# Patient Record
Sex: Female | Born: 1965
Health system: Southern US, Community
[De-identification: ages and names within clinical notes are randomized; demographics above are authoritative.]

## PROBLEM LIST (undated history)

## (undated) DIAGNOSIS — D649 Anemia, unspecified: Secondary | ICD-10-CM

## (undated) DIAGNOSIS — J45909 Unspecified asthma, uncomplicated: Secondary | ICD-10-CM

## (undated) DIAGNOSIS — K219 Gastro-esophageal reflux disease without esophagitis: Secondary | ICD-10-CM

## (undated) DIAGNOSIS — N189 Chronic kidney disease, unspecified: Secondary | ICD-10-CM

## (undated) DIAGNOSIS — E119 Type 2 diabetes mellitus without complications: Secondary | ICD-10-CM

## (undated) DIAGNOSIS — K589 Irritable bowel syndrome without diarrhea: Secondary | ICD-10-CM

## (undated) DIAGNOSIS — G5600 Carpal tunnel syndrome, unspecified upper limb: Secondary | ICD-10-CM

## (undated) DIAGNOSIS — F419 Anxiety disorder, unspecified: Secondary | ICD-10-CM

## (undated) DIAGNOSIS — E669 Obesity, unspecified: Secondary | ICD-10-CM

## (undated) DIAGNOSIS — M1990 Unspecified osteoarthritis, unspecified site: Secondary | ICD-10-CM

## (undated) DIAGNOSIS — I10 Essential (primary) hypertension: Secondary | ICD-10-CM

## (undated) DIAGNOSIS — F32A Depression, unspecified: Secondary | ICD-10-CM

## (undated) HISTORY — DX: Unspecified osteoarthritis, unspecified site: M19.90

## (undated) HISTORY — PX: CHOLECYSTECTOMY: SHX55

## (undated) HISTORY — PX: HIP SURGERY: SHX245

## (undated) HISTORY — DX: Type 2 diabetes mellitus without complications: E11.9

## (undated) HISTORY — DX: Anxiety disorder, unspecified: F41.9

## (undated) HISTORY — DX: Unspecified asthma, uncomplicated: J45.909

## (undated) HISTORY — DX: Carpal tunnel syndrome, unspecified upper limb: G56.00

## (undated) HISTORY — DX: Chronic kidney disease, unspecified: N18.9

## (undated) HISTORY — PX: MASTECTOMY: SHX3

## (undated) HISTORY — PX: OTHER SURGICAL HISTORY: SHX169

## (undated) HISTORY — DX: Essential (primary) hypertension: I10

## (undated) HISTORY — DX: Obesity, unspecified: E66.9

## (undated) HISTORY — DX: Gastro-esophageal reflux disease without esophagitis: K21.9

## (undated) HISTORY — DX: Irritable bowel syndrome, unspecified: K58.9

## (undated) HISTORY — DX: Depression, unspecified: F32.A

## (undated) HISTORY — PX: DILATION AND CURETTAGE OF UTERUS: SHX78

## (undated) HISTORY — DX: Anemia, unspecified: D64.9

---

## 1998-06-26 ENCOUNTER — Other Ambulatory Visit: Admission: RE | Admit: 1998-06-26 | Discharge: 1998-06-26 | Payer: Self-pay | Admitting: Obstetrics & Gynecology

## 1999-08-30 ENCOUNTER — Other Ambulatory Visit: Admission: RE | Admit: 1999-08-30 | Discharge: 1999-08-30 | Payer: Self-pay | Admitting: Obstetrics & Gynecology

## 2000-01-31 ENCOUNTER — Encounter: Payer: Self-pay | Admitting: Obstetrics & Gynecology

## 2000-01-31 ENCOUNTER — Ambulatory Visit (HOSPITAL_COMMUNITY): Admission: RE | Admit: 2000-01-31 | Discharge: 2000-01-31 | Payer: Self-pay | Admitting: Obstetrics & Gynecology

## 2000-03-06 ENCOUNTER — Encounter: Payer: Self-pay | Admitting: Obstetrics & Gynecology

## 2000-03-06 ENCOUNTER — Ambulatory Visit (HOSPITAL_COMMUNITY): Admission: RE | Admit: 2000-03-06 | Discharge: 2000-03-06 | Payer: Self-pay | Admitting: Obstetrics & Gynecology

## 2000-07-11 ENCOUNTER — Inpatient Hospital Stay (HOSPITAL_COMMUNITY): Admission: AD | Admit: 2000-07-11 | Discharge: 2000-07-13 | Payer: Self-pay | Admitting: Obstetrics & Gynecology

## 2003-01-27 ENCOUNTER — Other Ambulatory Visit: Admission: RE | Admit: 2003-01-27 | Discharge: 2003-01-27 | Payer: Self-pay | Admitting: *Deleted

## 2004-02-06 ENCOUNTER — Ambulatory Visit (HOSPITAL_COMMUNITY): Admission: RE | Admit: 2004-02-06 | Discharge: 2004-02-06 | Payer: Self-pay | Admitting: Obstetrics

## 2005-10-08 ENCOUNTER — Other Ambulatory Visit: Admission: RE | Admit: 2005-10-08 | Discharge: 2005-10-08 | Payer: Self-pay | Admitting: Obstetrics and Gynecology

## 2006-01-11 ENCOUNTER — Encounter (INDEPENDENT_AMBULATORY_CARE_PROVIDER_SITE_OTHER): Payer: Self-pay | Admitting: Specialist

## 2006-01-11 ENCOUNTER — Ambulatory Visit (HOSPITAL_COMMUNITY): Admission: RE | Admit: 2006-01-11 | Discharge: 2006-01-11 | Payer: Self-pay | Admitting: Obstetrics and Gynecology

## 2006-01-24 ENCOUNTER — Ambulatory Visit (HOSPITAL_COMMUNITY): Admission: RE | Admit: 2006-01-24 | Discharge: 2006-01-24 | Payer: Self-pay | Admitting: Obstetrics and Gynecology

## 2008-07-19 ENCOUNTER — Encounter: Admission: RE | Admit: 2008-07-19 | Discharge: 2008-07-19 | Payer: Self-pay | Admitting: Obstetrics and Gynecology

## 2008-10-20 ENCOUNTER — Inpatient Hospital Stay (HOSPITAL_COMMUNITY): Admission: RE | Admit: 2008-10-20 | Discharge: 2008-10-24 | Payer: Self-pay | Admitting: Obstetrics and Gynecology

## 2008-10-21 ENCOUNTER — Encounter (INDEPENDENT_AMBULATORY_CARE_PROVIDER_SITE_OTHER): Payer: Self-pay | Admitting: Obstetrics and Gynecology

## 2010-08-05 ENCOUNTER — Encounter: Payer: Self-pay | Admitting: Obstetrics and Gynecology

## 2010-10-24 LAB — CBC
HCT: 27.4 % — ABNORMAL LOW (ref 36.0–46.0)
HCT: 35.8 % — ABNORMAL LOW (ref 36.0–46.0)
Hemoglobin: 9.2 g/dL — ABNORMAL LOW (ref 12.0–15.0)
Platelets: 286 10*3/uL (ref 150–400)
RDW: 15.1 % (ref 11.5–15.5)
WBC: 12.3 10*3/uL — ABNORMAL HIGH (ref 4.0–10.5)

## 2010-10-24 LAB — COMPREHENSIVE METABOLIC PANEL
ALT: 19 U/L (ref 0–35)
AST: 24 U/L (ref 0–37)
BUN: 16 mg/dL (ref 6–23)
CO2: 22 mEq/L (ref 19–32)
Calcium: 9.7 mg/dL (ref 8.4–10.5)
Chloride: 106 mEq/L (ref 96–112)
Creatinine, Ser: 0.76 mg/dL (ref 0.4–1.2)
GFR calc non Af Amer: >60 mL/min (ref >60)
Sodium: 135 mEq/L (ref 135–145)
Total Protein: 6 g/dL (ref 6.0–8.3)

## 2010-10-24 LAB — GLUCOSE, CAPILLARY

## 2010-11-27 NOTE — Discharge Summary (Signed)
NAME:  Laurie Simmons, Laurie Simmons NO.:  [id number]   MEDICAL RECORD NO.:  [id number]          PATIENT TYPE:  INP   LOCATION:  9108                          FACILITY:  [facility]   PHYSICIAN:  Lenoard Aden, M.D.DATE OF BIRTH:  1966-02-09   DATE OF ADMISSION:  10/20/2008  DATE OF DISCHARGE:  10/24/2008                               DISCHARGE SUMMARY   ADMITTING DIAGNOSES:  1. Term pregnancy at 39 weeks.  2. Diabetes.  3. Hypertension.   DISCHARGE DIAGNOSES:  1. Term cesarean section delivery.  2. Diabetes.  3. Hypertension.   The patient is a 45 year old white female G4, P1-0-2-2, status post C-  section for fetal distress.  She received prenatal care at [facility]  since [redacted] weeks gestation with Dr. Billy Coast, primary Taelynn Mcelhannon.  Risk factors  in pregnancy included gestational diabetes and chronic hypertension.   PRENATAL LABS:  GBS status positive.  Blood type O positive.   MEDICAL HISTORY:  Significant for,  1. Obesity.  2. Gastroesophageal reflux disease.  3. IBS.  4. Hiatal hernia.   GYNECOLOGIC HISTORY:  Significant for small uterine fibroid.   PREGNANCY HISTORY:  Significant for gestational diabetes.  She has had 2  miscarriages and 1 uncomplicated vaginal delivery.  She has also had a D  and C x2.   SURGICAL HISTORY:  Positive for cholecystectomy and dislocated hip  surgery.  She has had wisdom teeth extraction.  She is married.   PHYSICAL EXAMINATION:  VITAL SIGNS:  Today, temperature 98.2, heart rate  83, respirations 18, and blood pressure is 117/75.  LUNGS:  Clear to auscultation.  HEART:  Regular rate and rhythm.  ABDOMEN:  Soft and nontender.  Positive bowel sounds x4 quadrants.  Fundus is firm at the umbilicus.  Incision is noted to have moderate  erythema superior to staples.  Staples are otherwise intact.  Scant  lochia.  EXTREMITIES:  Trace pedal edema.  Negative Homans.   Female infant is in the NICU with low blood sugars, unsure of discharge  today  for the infant.  The circumcision has been done.  Baby is bottle  feeding.  This is postop day #3.  Planning discharge, as the patient's  stable status.   DISCHARGE DIAGNOSES:  1. Gravida 4, para 2, with primary low transverse cesarean section.  2. History of gestational diabetes, A2, diet controlled.  3. Chronic hypertension, on Procardia and labetalol.  Blood pressures      are stable.  She has acute blood loss anemia.  Her status is      stable.  Continue Procardia and labetalol after discharge.   PLAN:  To remove staples and recheck BP in the office on October 27, 2007,  at 2 p.m.   DISCHARGE MEDICATIONS:  1. Percocet 1-2 tabs p.o. every 4 hours p.r.n., dispensed #25.  2. Ibuprofen 600 mg p.o. every 6 hours p.r.n., dispensed #25.   ACTIVITY AT DISCHARGE:  Up ad lib.   DIET:  Regular.   Postop incision care is to keep it clean and dry.  Symptoms to report  are fever,  vomiting, and heavy vaginal bleeding.  The [facility] OB/GYN  booklet has been given.  Again, followup appointment on October 26, 2008,  at 2 p.m.      Arlana Lindau, NP      Lenoard Aden, M.D.  Electronically Signed    JF/MEDQ  D:  10/24/2008  T:  10/25/2008  Job:  [id number]

## 2010-11-27 NOTE — H&P (Signed)
NAME:  Laurie Simmons, Laurie Simmons NO.:  [id number]   MEDICAL RECORD NO.:  [id number]          PATIENT TYPE:  INP   LOCATION:  9198                          [facility]ILITY[facility]  [facility]   PHYSICIAN:  Lenoard Aden, M.D.DATE OF BIRTH:  September 19, 1965   DATE OF ADMISSION:  10/20/2008  DATE OF DISCHARGE:                              HISTORY & PHYSICAL   CHIEF COMPLAINT:  Diabetes, hypertension, and induction at 39 weeks.   She is a 45 year old white female G4, P1-0-2-1, currently at [redacted] weeks  gestation who presents for cervical ripening induction.  She has known  gestational diabetes with recent poor control noted.  She has a history  of labile chronic hypertension, on Procardia and labetalol.  She is a  nonsmoker, nondrinker.  She denies domestic physical violence.   She allergies to CODEINE.   She has a personal history of reflux, IBS, and hiatal hernia __________  small uterine fibroid.  Gestational diabetes is noted and 2  uncomplicated pregnancy losses __________ remarkable for one  uncomplicated 7 pounds 15 ounce delivery at term.   SURGICAL HISTORY:  Remarkable for  D and C x2, cholecystectomy,  dislocated hip surgery in 1970, wisdom teeth extraction.   PHYSICAL EXAMINATION:  GENERAL:  She is a well-developed, well-nourished  white female, in no acute distress.  HEENT:  Normal.  LUNGS:  Clear.  HEART:  Regular rate and rhythm.  ABDOMEN:  Soft, gravid, and nontender.  __________.  Cervix is closed, 70% vertex, -1.  EXTREMITIES:  There are no cords.  NEUROLOGIC:  Nonfocal.  SKIN:  Intact.   NST is reactive.  Cervidil was placed.  Glucose and PIH panel pending.   IMPRESSION:  1. A 39-week OB.  2  Gestational diabetes, controlled by diet.  1. Chronic hypertension on labetalol and Procardia with appropriately-      grown fetus and normal antepartum surveillance.   PLAN:  Plan is to proceed with cervical ripening and induction.  GBS  positive.  We will treat antepartum with  penicillin prophylaxis.      Lenoard Aden, M.D.  Electronically Signed     RJT/MEDQ  D:  10/20/2008  T:  10/21/2008  Job:  [id number]

## 2010-11-27 NOTE — Op Note (Signed)
NAME:  Laurie Simmons, Laurie Simmons NO.:  [id number]   MEDICAL RECORD NO.:  [id number]          PATIENT TYPE:  INP   LOCATION:  9108                          [facility]   PHYSICIAN:  Lenoard Aden, M.D.DATE OF BIRTH:  10/14/1965   DATE OF PROCEDURE:  10/21/2008  DATE OF DISCHARGE:                               OPERATIVE REPORT   PREOPERATIVE DIAGNOSES:  1. A 39-week intrauterine pregnancy.  2. Chronic hypertension.  3. Gestational diabetes.  4. Nonreassuring fetal heart rate tracing.   POSTOPERATIVE DIAGNOSES:  1. A 39-week intrauterine pregnancy.  2. Chronic hypertension.  3. Gestational diabetes.  4. Nonreassuring fetal heart rate tracing.   PROCEDURE:  Primary low-segment transverse cesarean section.   SURGEON:  Lenoard Aden, MD   ASSISTANT:  Lendon Colonel, MD   ANESTHESIA:  Spinal by Raul Del, MD   ESTIMATED BLOOD LOSS:  1000 mL   COMPLICATIONS:  None.   DRAINS:  Foley.   COUNTS:  Correct.   DISPOSITION:  The patient to recovery in good condition.   FINDINGS:  Full-term living female, occiput transverse position, Apgars 7  and 8.  Cord pH is 7.13.  Nuchal cord x2.  Placenta to Pathology.   BRIEF OPERATIVE NOTE:  After being apprised risks of anesthesia,  infection, bleeding, injury to abdominal organs, and need for repair  delayed versus immediate complications to include bowel and bladder  injury, the patient was brought to the operating room where she was  administered spinal anesthetic without complications, prepped and draped  in usual sterile fashion.  Foley catheter placed.  Pfannenstiel skin  incision made with a scalpel, carried down to fascia which was nicked in  the midline and opened transversely using Mayo scissors.  Rectus muscles  were dissected sharply in midline.  Peritoneum entered sharply.  Bladder  blade placed.  Visceral peritoneum scored sharply off the lower uterine  segment.  Kerr hysterotomy incision was  made.  Atraumatic delivery, full  term living female as noted.  Apgars 7 and 8.  Cord pH as noted.  Placenta  delivered from posterior location intact, three-vessel cord.  Uterus  exteriorized, curetted using dry lap pack and closed in 2 running  imbricating layers of 0 Monocryl suture.  There was a large 4-5 cm  anterior wall intramural fibroid palpable.  At this time, good  hemostasis was noted.  Interrupted suture placed in midline for  hemostasis.  Irrigation accomplished.  Bladder flap inspected, found be  hemostatic.  Normal tubes and normal ovaries were noted.  At this time,  after a two-layer uterine closure, and otherwise normal-appearing  endometrial cavity and normal abdominal cavity, the uterus has been  replaced and peritoneum was reapproximated using the 2-0 chromic in a  continuous running fashion.  Fascia closed using a 0 Monocryl in a  continuous running fashion.  Subcutaneous tissue reapproximated using a  2-0 plain in a continuous running fashion.  Skin closed using staples.  The patient tolerated the procedure well to recovery in good condition.      Richard J.  Billy Coast, M.D.  Electronically Signed     RJT/MEDQ  D:  10/21/2008  T:  10/22/2008  Job:  [id number]

## 2010-11-30 NOTE — Op Note (Signed)
NAME:  Laurie Simmons, Laurie Simmons NO.:  [id number]   MEDICAL RECORD NO.:  [id number]          PATIENT TYPE:  INP   LOCATION:  9304                          [facility]ILITY:  [facility]   PHYSICIAN:  Crist Fat. Rivard, M.D. DATE OF BIRTH:  1966/04/29   DATE OF PROCEDURE:  DATE OF DISCHARGE:                                 OPERATIVE REPORT   PREOPERATIVE DIAGNOSIS:  Missed abortion.   POSTOPERATIVE DIAGNOSIS:  Missed abortion.   PROCEDURE:  D&E.   SURGEON:  Sandra A. Rivard, M.D.   ESTIMATED BLOOD LOSS:  Minimal.   PROCEDURE:  After being informed of the planned procedure with possible  complications, including bleeding, infection, injury to uterus and retained  products of conception, informed consent is obtained and the patient is  taken to OR #4 and given IV sedation and placed in the lithotomy position.  She is prepped and draped in a sterile fashion and her bladder is emptied  with an in-and-out Foley catheter.  GYN exam reveals a bulky uterus 12-13  weeks in size with a flared cervix, two normal adnexa.  A weighted speculum  is inserted in the vagina and the anterior lip of the cervix is grasped with  tenaculum forceps.  We proceed with paracervical block using 20 mL of  Novocain 1% in the usual fashion.  Uterus is then sounded at 13 cm and the  cervix is easily dilated until Hegar dilator #31.  This allows easy entry of  a curved #10 cannula and we proceed with suction evacuation of products of  conception.  After evacuation is completed, a sharp curette is used to  assess the uterine cavity which appears to be empty of tissue after removal  of some decidual endometrium.   Instruments were then removed.  Instruments and sponge count is complete x2.  No active bleeding is perceived.  The procedure is very well tolerated by  the patient who is taken to recovery room in a well and stable condition.  Estimated blood loss is minimal.      Dois Davenport A. Rivard, M.D.  Electronically  Signed     SAR/MEDQ  D:  01/11/2006  T:  01/11/2006  Job:  [id number]

## 2010-11-30 NOTE — H&P (Signed)
NAME:  Laurie Simmons, Laurie Simmons                        ACCOUNT NO.:  0   MEDICAL RECORD NO.:  0                  PATIENT TYPE:   LOCATION:                                 FACILITY:   PHYSICIAN:  Naima A. Dillard, M.D. DATE OF BIRTH:  09/06/1965   DATE OF ADMISSION:  01/10/2006  DATE OF DISCHARGE:                                HISTORY & PHYSICAL   CHIEF COMPLAINT:  Missed abortion in first trimester.   HISTORY OF PRESENT ILLNESS:  The patient is a 45 year old, G2, P80 who  presented today on January 10, 2006, for a routine exam.  We were unable to  hear fetal heart tones.  An ultrasound was done and they were found to be  absent even with Doppler.  The patient was given her options and she has  decided to proceed with a D&E.   PAST MEDICAL HISTORY:  1.  Chronic hypertension.  2.  Gastroesophageal reflux disease.   MEDICATIONS:  1.  Labetalol 200 mg b.i.d.  2.  Zofran 4 mg p.o. q.6h. as needed.  3.  Nexium 40 mg each day.  4.  Prenatal vitamin one tablet each day.   PAST SURGICAL HISTORY:  Unremarkable.   PAST OBSTETRICAL HISTORY:  Significant for vaginal delivery in 2001,  weighing 7 pounds 15 ounces with no complications.   ALLERGIES:  CODEINE.   SOCIAL HISTORY:  The patient denies any alcohol, tobacco or drug use.   REVIEW OF SYSTEMS:  GENITOURINARY:  As above.  CARDIOVASCULAR:  Hypertension.  GASTROINTESTINAL:  Significant for GERD.  RESPIRATORY:  Unremarkable.  PSYCHIATRIC:  Unremarkable.   PHYSICAL EXAMINATION:  VITAL SIGNS:  Weight 236 pounds.  Blood pressure  130/80.  GENERAL:  The patient is in no apparent distress.  HEART:  Regular rate and rhythm.  LUNGS:  Clear to auscultation bilaterally.  ABDOMEN:  Soft and nontender.  EXTREMITIES:  No clubbing, cyanosis or edema.   LABORATORY DATA AND X-RAY FINDINGS:  On ultrasound, the fetus measures 11  weeks and 1 day and no fetal heart tones are present.   ASSESSMENT:  Missed abortion in the first trimester.   PLAN:  Plan is  for D&E.  The patient understands the risks are, but not  limited to, bleeding, infection, damage to internal organs such as bowel,  bladder, major blood vessels.  The procedure will be done with Dr. Estanislado Pandy.      Naima A. Normand Sloop, M.D.  Electronically Signed     NAD/MEDQ  D:  01/10/2006  T:  01/10/2006  Job:  [id number]

## 2011-01-23 ENCOUNTER — Other Ambulatory Visit: Payer: Self-pay | Admitting: Obstetrics and Gynecology

## 2011-01-23 DIAGNOSIS — Z1231 Encounter for screening mammogram for malignant neoplasm of breast: Secondary | ICD-10-CM

## 2011-02-06 ENCOUNTER — Ambulatory Visit
Admission: RE | Admit: 2011-02-06 | Discharge: 2011-02-06 | Disposition: A | Discharge status: Home / Self Care | Payer: BC Managed Care – PPO | Source: Ambulatory Visit | Attending: Obstetrics and Gynecology | Admitting: Obstetrics and Gynecology

## 2011-02-06 DIAGNOSIS — Z1231 Encounter for screening mammogram for malignant neoplasm of breast: Secondary | ICD-10-CM

## 2015-03-10 DIAGNOSIS — R0989 Other specified symptoms and signs involving the circulatory and respiratory systems: Secondary | ICD-10-CM

## 2015-03-10 DIAGNOSIS — J309 Allergic rhinitis, unspecified: Secondary | ICD-10-CM

## 2015-03-10 DIAGNOSIS — L501 Idiopathic urticaria: Secondary | ICD-10-CM

## 2015-03-10 DIAGNOSIS — J4540 Moderate persistent asthma, uncomplicated: Secondary | ICD-10-CM

## 2015-03-10 DIAGNOSIS — K219 Gastro-esophageal reflux disease without esophagitis: Secondary | ICD-10-CM | POA: Insufficient documentation

## 2015-03-10 HISTORY — DX: Moderate persistent asthma, uncomplicated: J45.40

## 2015-03-10 HISTORY — DX: Other specified symptoms and signs involving the circulatory and respiratory systems: R09.89

## 2015-03-10 HISTORY — DX: Idiopathic urticaria: L50.1

## 2015-03-10 HISTORY — DX: Allergic rhinitis, unspecified: J30.9

## 2015-04-13 ENCOUNTER — Ambulatory Visit (INDEPENDENT_AMBULATORY_CARE_PROVIDER_SITE_OTHER): Payer: BC Managed Care – PPO | Admitting: *Deleted

## 2015-04-13 DIAGNOSIS — J3089 Other allergic rhinitis: Secondary | ICD-10-CM

## 2015-04-20 DIAGNOSIS — J301 Allergic rhinitis due to pollen: Secondary | ICD-10-CM | POA: Diagnosis not present

## 2015-04-21 DIAGNOSIS — J3089 Other allergic rhinitis: Secondary | ICD-10-CM | POA: Diagnosis not present

## 2015-04-27 ENCOUNTER — Ambulatory Visit (INDEPENDENT_AMBULATORY_CARE_PROVIDER_SITE_OTHER): Payer: BC Managed Care – PPO | Admitting: *Deleted

## 2015-04-27 DIAGNOSIS — J309 Allergic rhinitis, unspecified: Secondary | ICD-10-CM

## 2015-05-11 ENCOUNTER — Ambulatory Visit (INDEPENDENT_AMBULATORY_CARE_PROVIDER_SITE_OTHER): Payer: BC Managed Care – PPO

## 2015-05-11 DIAGNOSIS — J309 Allergic rhinitis, unspecified: Secondary | ICD-10-CM | POA: Diagnosis not present

## 2015-05-25 ENCOUNTER — Ambulatory Visit (INDEPENDENT_AMBULATORY_CARE_PROVIDER_SITE_OTHER): Payer: BC Managed Care – PPO

## 2015-05-25 DIAGNOSIS — J309 Allergic rhinitis, unspecified: Secondary | ICD-10-CM | POA: Diagnosis not present

## 2015-06-12 ENCOUNTER — Ambulatory Visit (INDEPENDENT_AMBULATORY_CARE_PROVIDER_SITE_OTHER): Payer: BC Managed Care – PPO

## 2015-06-12 DIAGNOSIS — J309 Allergic rhinitis, unspecified: Secondary | ICD-10-CM

## 2015-06-20 DIAGNOSIS — J301 Allergic rhinitis due to pollen: Secondary | ICD-10-CM | POA: Diagnosis not present

## 2015-06-21 DIAGNOSIS — J3089 Other allergic rhinitis: Secondary | ICD-10-CM | POA: Diagnosis not present

## 2015-06-23 ENCOUNTER — Ambulatory Visit (INDEPENDENT_AMBULATORY_CARE_PROVIDER_SITE_OTHER): Payer: BC Managed Care – PPO | Admitting: *Deleted

## 2015-06-23 DIAGNOSIS — J309 Allergic rhinitis, unspecified: Secondary | ICD-10-CM | POA: Diagnosis not present

## 2015-06-29 ENCOUNTER — Ambulatory Visit (INDEPENDENT_AMBULATORY_CARE_PROVIDER_SITE_OTHER): Payer: BC Managed Care – PPO

## 2015-06-29 DIAGNOSIS — J309 Allergic rhinitis, unspecified: Secondary | ICD-10-CM | POA: Diagnosis not present

## 2015-07-03 ENCOUNTER — Encounter: Payer: Self-pay | Admitting: Allergy and Immunology

## 2015-07-03 ENCOUNTER — Ambulatory Visit (INDEPENDENT_AMBULATORY_CARE_PROVIDER_SITE_OTHER): Payer: BC Managed Care – PPO | Admitting: Allergy and Immunology

## 2015-07-03 VITALS — BP 120/88 | HR 84 | Resp 18 | Ht 64.29 in | Wt 262.3 lb

## 2015-07-03 DIAGNOSIS — J4540 Moderate persistent asthma, uncomplicated: Secondary | ICD-10-CM

## 2015-07-03 DIAGNOSIS — K219 Gastro-esophageal reflux disease without esophagitis: Secondary | ICD-10-CM | POA: Diagnosis not present

## 2015-07-03 DIAGNOSIS — J3089 Other allergic rhinitis: Secondary | ICD-10-CM | POA: Diagnosis not present

## 2015-07-03 DIAGNOSIS — L501 Idiopathic urticaria: Secondary | ICD-10-CM

## 2015-07-03 NOTE — Progress Notes (Signed)
[facility]  Follow-up Note  Refering Provider: No ref. provider found Primary Provider: Marco Collie, MD  Subjective:   Laurie Simmons is a 49 y.o. female who returns to the [place] in re-evaluation of the following:  HPI Comments:  Laurie Simmons returns to this clinic on 07/03/2015 in reevaluation of her asthma, allergic rhinitis, LPR and intermittent urticaria. Overall she is done quite well over the course of the past 6 months while consistently using her medical therapy which includes Qvar, montelukast, and Qnasl as well as Nexium 40 mg twice a day and immunotherapy. She is not had any exacerbations of her asthma requiring her to get a systemic steroid, she is does not have to use a short acting bronchodilator greater than twice a week, she's had very little problems with her nose and no problems with her throat and her reflux is under good control. She did obtain a flu vaccine. Laurie Simmons continues on immunotherapy and does have an EpiPen.   Current Outpatient Prescriptions on File Prior to Visit  Medication Sig Dispense Refill  . albuterol (VENTOLIN HFA) 108 (90 BASE) MCG/ACT inhaler Inhale 2 puffs into the lungs every 4 (four) hours as needed for wheezing or shortness of breath.    . beclomethasone (QVAR) 80 MCG/ACT inhaler Inhale 2 puffs into the lungs 2 (two) times daily.    . Beclomethasone Dipropionate (QNASL) 80 MCG/ACT AERS Place 2 puffs into the nose daily.    Marland Kitchen esomeprazole (NEXIUM) 40 MG capsule Take 40 mg by mouth 2 (two) times daily before a meal.    . montelukast (SINGULAIR) 10 MG tablet Take 10 mg by mouth at bedtime.    Marland Kitchen loratadine (CLARITIN) 10 MG tablet Take 10 mg by mouth daily. Reported on 07/03/2015     No current facility-administered medications on file prior to visit.    No orders of the defined types were placed in this encounter.    Past Medical History  Diagnosis Date  . Asthma   . GERD  (gastroesophageal reflux disease)     Past Surgical History  Procedure Laterality Date  . Caesarean    . Cholecystectomy    . Hip surgery Left     Allergies  Allergen Reactions  . Prednisone Hives and Other (See Comments)    Mood Swings.  . Codeine Anxiety    Review of Systems  Constitutional: Negative.   HENT: Negative.   Eyes: Negative.   Respiratory: Negative.   Cardiovascular: Negative.   Gastrointestinal: Negative.   Musculoskeletal: Negative.   Skin: Negative.   Hematological: Negative.      Objective:   Filed Vitals:   07/03/15 1557  BP: 120/88  Pulse: 84  Resp: 18   Height: 5' 4.29" (163.3 cm)  Weight: 262 lb 5.6 oz (119 kg)   Physical Exam  Constitutional: She appears well-developed and well-nourished. No distress.  HENT:  Head: Normocephalic and atraumatic. Head is without right periorbital erythema and without left periorbital erythema.  Right Ear: Tympanic membrane, external ear and ear canal normal. No drainage or tenderness. No foreign bodies. Tympanic membrane is not injected, not scarred, not perforated, not erythematous, not retracted and not bulging. No middle ear effusion.  Left Ear: Tympanic membrane, external ear and ear canal normal. No drainage or tenderness. No foreign bodies. Tympanic membrane is not injected, not scarred, not perforated, not erythematous, not retracted and not bulging.  No middle ear effusion.  Nose: Nose normal.  No mucosal edema, rhinorrhea, nose lacerations or sinus tenderness.  No foreign bodies.  Mouth/Throat: Oropharynx is clear and moist. No oropharyngeal exudate, posterior oropharyngeal edema, posterior oropharyngeal erythema or tonsillar abscesses.  Eyes: Lids are normal. Right eye exhibits no chemosis, no discharge and no exudate. No foreign body present in the right eye. Left eye exhibits no chemosis, no discharge and no exudate. No foreign body present in the left eye. Right conjunctiva is not injected. Left  conjunctiva is not injected.  Neck: Neck supple. No tracheal tenderness present. No tracheal deviation and no edema present. No thyroid mass and no thyromegaly present.  Cardiovascular: Normal rate, regular rhythm, S1 normal and S2 normal.  Exam reveals no gallop.   No murmur heard. Pulmonary/Chest: No accessory muscle usage or stridor. No respiratory distress. She has no wheezes. She has no rhonchi. She has no rales.  Abdominal: Soft.  Lymphadenopathy:       Head (right side): No tonsillar adenopathy present.       Head (left side): No tonsillar adenopathy present.    She has no cervical adenopathy.  Neurological: She is alert.  Skin: No rash noted. She is not diaphoretic.  Psychiatric: She has a normal mood and affect. Her behavior is normal.    Diagnostics:    Spirometry was performed and demonstrated an FEV1 of 2.30 at 80 % of predicted.  The patient had an Asthma Control Test with the following results: ACT Total Score: 24.    Assessment and Plan:   1. Moderate persistent asthma, uncomplicated   2. Other allergic rhinitis   3. Gastroesophageal reflux disease, esophagitis presence not specified   4. Idiopathic urticaria      1. Qvar 80 2 inhalations one time per day. Increase to 3 inhalations 3 times per day during flare up  2. Qnasl 80 1-2 sprays each nostril 3-7 times per week depending on disease activity  3. Montelukast 10 mg daily  4. Nexium 40 mg twice a day  5. Ventolin HFA and Claritin if needed  6. Continue immunotherapy and EpiPen  7. Return to clinic in 6 months or earlier if problem  I will have Laurie Simmons continue to use Qnasl and Qvar and montelukast and Nexium as specified above. She will continue to use immunotherapy as this appears to result in pretty good control of her atopic disease. I will see her back in this clinic in 6 months to investigate whether there is an additional opportunity to consolidate her treatment especially given the fact that she is  having a very good response to immunotherapy.   Allena Katz, MD [place]

## 2015-07-03 NOTE — Patient Instructions (Signed)
  1. Qvar 80 2 inhalations one time per day. Increase to 3 inhalations 3 times per day during flare up  2. Qnasl 80 1-2 sprays each nostril 3-7 times per week depending on disease activity  3. Montelukast 10 mg daily  4. Nexium 40 g twice a day  5. Ventolin HFA and Claritin if needed  6. Continue immunotherapy and EpiPen  7. Return to clinic in 6 months or earlier if problem

## 2015-07-13 ENCOUNTER — Ambulatory Visit (INDEPENDENT_AMBULATORY_CARE_PROVIDER_SITE_OTHER): Payer: BC Managed Care – PPO | Admitting: *Deleted

## 2015-07-13 DIAGNOSIS — J309 Allergic rhinitis, unspecified: Secondary | ICD-10-CM

## 2015-07-20 ENCOUNTER — Ambulatory Visit (INDEPENDENT_AMBULATORY_CARE_PROVIDER_SITE_OTHER): Payer: BC Managed Care – PPO | Admitting: *Deleted

## 2015-07-20 DIAGNOSIS — J309 Allergic rhinitis, unspecified: Secondary | ICD-10-CM | POA: Diagnosis not present

## 2015-07-27 ENCOUNTER — Ambulatory Visit (INDEPENDENT_AMBULATORY_CARE_PROVIDER_SITE_OTHER): Payer: BC Managed Care – PPO | Admitting: *Deleted

## 2015-07-27 DIAGNOSIS — J309 Allergic rhinitis, unspecified: Secondary | ICD-10-CM

## 2015-07-31 ENCOUNTER — Other Ambulatory Visit: Payer: Self-pay

## 2015-07-31 ENCOUNTER — Other Ambulatory Visit: Payer: Self-pay | Admitting: *Deleted

## 2015-07-31 MED ORDER — ESOMEPRAZOLE MAGNESIUM 40 MG PO CPDR: 40.0000 mg | DELAYED_RELEASE_CAPSULE | Freq: Two times a day (BID) | ORAL | Status: DC

## 2015-08-03 ENCOUNTER — Ambulatory Visit (INDEPENDENT_AMBULATORY_CARE_PROVIDER_SITE_OTHER): Payer: BC Managed Care – PPO | Admitting: *Deleted

## 2015-08-03 DIAGNOSIS — J309 Allergic rhinitis, unspecified: Secondary | ICD-10-CM

## 2015-08-11 ENCOUNTER — Ambulatory Visit (INDEPENDENT_AMBULATORY_CARE_PROVIDER_SITE_OTHER): Payer: BC Managed Care – PPO

## 2015-08-11 DIAGNOSIS — J309 Allergic rhinitis, unspecified: Secondary | ICD-10-CM

## 2015-08-17 ENCOUNTER — Ambulatory Visit (INDEPENDENT_AMBULATORY_CARE_PROVIDER_SITE_OTHER): Payer: BC Managed Care – PPO | Admitting: *Deleted

## 2015-08-17 DIAGNOSIS — J309 Allergic rhinitis, unspecified: Secondary | ICD-10-CM | POA: Diagnosis not present

## 2015-08-31 ENCOUNTER — Ambulatory Visit (INDEPENDENT_AMBULATORY_CARE_PROVIDER_SITE_OTHER): Payer: BC Managed Care – PPO | Admitting: *Deleted

## 2015-08-31 DIAGNOSIS — J309 Allergic rhinitis, unspecified: Secondary | ICD-10-CM

## 2015-09-14 ENCOUNTER — Encounter: Payer: Self-pay | Admitting: Allergy and Immunology

## 2015-09-14 ENCOUNTER — Ambulatory Visit (INDEPENDENT_AMBULATORY_CARE_PROVIDER_SITE_OTHER): Payer: BC Managed Care – PPO | Admitting: *Deleted

## 2015-09-14 DIAGNOSIS — J309 Allergic rhinitis, unspecified: Secondary | ICD-10-CM | POA: Diagnosis not present

## 2015-09-28 ENCOUNTER — Ambulatory Visit (INDEPENDENT_AMBULATORY_CARE_PROVIDER_SITE_OTHER): Payer: BC Managed Care – PPO | Admitting: *Deleted

## 2015-09-28 DIAGNOSIS — J309 Allergic rhinitis, unspecified: Secondary | ICD-10-CM | POA: Diagnosis not present

## 2015-10-12 ENCOUNTER — Ambulatory Visit (INDEPENDENT_AMBULATORY_CARE_PROVIDER_SITE_OTHER): Payer: BC Managed Care – PPO | Admitting: *Deleted

## 2015-10-12 DIAGNOSIS — J309 Allergic rhinitis, unspecified: Secondary | ICD-10-CM

## 2015-10-20 ENCOUNTER — Ambulatory Visit (INDEPENDENT_AMBULATORY_CARE_PROVIDER_SITE_OTHER): Payer: BC Managed Care – PPO | Admitting: *Deleted

## 2015-10-20 DIAGNOSIS — J309 Allergic rhinitis, unspecified: Secondary | ICD-10-CM | POA: Diagnosis not present

## 2015-10-30 ENCOUNTER — Ambulatory Visit (INDEPENDENT_AMBULATORY_CARE_PROVIDER_SITE_OTHER): Payer: BC Managed Care – PPO | Admitting: *Deleted

## 2015-10-30 DIAGNOSIS — J309 Allergic rhinitis, unspecified: Secondary | ICD-10-CM

## 2015-11-06 ENCOUNTER — Ambulatory Visit (INDEPENDENT_AMBULATORY_CARE_PROVIDER_SITE_OTHER): Payer: BC Managed Care – PPO | Admitting: *Deleted

## 2015-11-06 DIAGNOSIS — J309 Allergic rhinitis, unspecified: Secondary | ICD-10-CM

## 2015-11-17 ENCOUNTER — Ambulatory Visit (INDEPENDENT_AMBULATORY_CARE_PROVIDER_SITE_OTHER): Payer: BC Managed Care – PPO | Admitting: *Deleted

## 2015-11-17 DIAGNOSIS — J309 Allergic rhinitis, unspecified: Secondary | ICD-10-CM | POA: Diagnosis not present

## 2015-11-30 ENCOUNTER — Ambulatory Visit (INDEPENDENT_AMBULATORY_CARE_PROVIDER_SITE_OTHER): Payer: BC Managed Care – PPO | Admitting: *Deleted

## 2015-11-30 DIAGNOSIS — J309 Allergic rhinitis, unspecified: Secondary | ICD-10-CM | POA: Diagnosis not present

## 2015-12-14 ENCOUNTER — Ambulatory Visit (INDEPENDENT_AMBULATORY_CARE_PROVIDER_SITE_OTHER): Payer: BC Managed Care – PPO

## 2015-12-14 DIAGNOSIS — J309 Allergic rhinitis, unspecified: Secondary | ICD-10-CM | POA: Diagnosis not present

## 2015-12-14 DIAGNOSIS — D649 Anemia, unspecified: Secondary | ICD-10-CM

## 2015-12-14 HISTORY — DX: Anemia, unspecified: D64.9

## 2015-12-26 DIAGNOSIS — J301 Allergic rhinitis due to pollen: Secondary | ICD-10-CM | POA: Diagnosis not present

## 2015-12-27 DIAGNOSIS — J3089 Other allergic rhinitis: Secondary | ICD-10-CM | POA: Diagnosis not present

## 2015-12-28 ENCOUNTER — Ambulatory Visit (INDEPENDENT_AMBULATORY_CARE_PROVIDER_SITE_OTHER): Payer: BC Managed Care – PPO

## 2015-12-28 DIAGNOSIS — J309 Allergic rhinitis, unspecified: Secondary | ICD-10-CM | POA: Diagnosis not present

## 2016-01-01 ENCOUNTER — Encounter: Payer: Self-pay | Admitting: Allergy and Immunology

## 2016-01-01 ENCOUNTER — Ambulatory Visit (INDEPENDENT_AMBULATORY_CARE_PROVIDER_SITE_OTHER): Payer: BC Managed Care – PPO | Admitting: Allergy and Immunology

## 2016-01-01 VITALS — BP 130/82 | HR 100 | Resp 18

## 2016-01-01 DIAGNOSIS — K219 Gastro-esophageal reflux disease without esophagitis: Secondary | ICD-10-CM | POA: Diagnosis not present

## 2016-01-01 DIAGNOSIS — J4540 Moderate persistent asthma, uncomplicated: Secondary | ICD-10-CM

## 2016-01-01 DIAGNOSIS — J309 Allergic rhinitis, unspecified: Secondary | ICD-10-CM

## 2016-01-01 NOTE — Progress Notes (Signed)
Follow-up Note  Referring Provider: Marco Collie, MD Primary Provider: Marco Collie, MD Date of Office Visit: 01/01/2016  Subjective:   Laurie Simmons (DOB: November 06, 1965) is a 50 y.o. female who returns to the [facility] [place] on 01/01/2016 in re-evaluation of the following:  HPI: Laurie Simmons presents to this clinic in reevaluation of her asthma and allergic rhinitis treated with immunotherapy and LPR and intermittent urticaria. I last saw her in his clinic in December 2016.  During the interval she has not been having any palms with her asthma. She has not required a systemic steroid and can exercise and does not use a short acting bronchodilator. She continues on Qvar.  Her nose has been doing well. She's not required an antibiotic for an episode of sinusitis.  Her reflux was under very good control while consistently using her Nexium but unfortunately she developed what sounds like gastroenteritis and she's having some stomach upset along with her diarrhea that is slowly improving over the course of the past week.  Laurie Simmons has been diagnosed with iron deficiency anemia and is now using iron tablets and using orange juice at the time that she takes her iron tablet.    Medication List           beclomethasone 80 MCG/ACT inhaler  Commonly known as:  QVAR  Inhale 2 puffs into the lungs 2 (two) times daily.     EPIPEN 2-PAK 0.3 mg/0.3 mL Soaj injection  Generic drug:  EPINEPHrine  Use as directed for life-threatening allergic reaction.     esomeprazole 40 MG capsule  Commonly known as:  NEXIUM  Take 1 capsule (40 mg total) by mouth 2 (two) times daily before a meal.     ferrous sulfate 325 (65 FE) MG EC tablet  Take 325 mg by mouth 3 (three) times daily with meals.     LATUDA 40 MG Tabs tablet  Generic drug:  lurasidone  Take by mouth daily.     loratadine 10 MG tablet  Commonly known as:  CLARITIN  Take 10 mg by mouth daily. Reported on 07/03/2015     montelukast 10 MG  tablet  Commonly known as:  SINGULAIR  Take 10 mg by mouth at bedtime.     QNASL 80 MCG/ACT Aers  Generic drug:  Beclomethasone Dipropionate  Place 2 puffs into the nose daily.     valsartan-hydrochlorothiazide 320-25 MG tablet  Commonly known as:  DIOVAN-HCT  Take 1 tablet by mouth daily.     VENTOLIN HFA 108 (90 Base) MCG/ACT inhaler  Generic drug:  albuterol  Inhale 2 puffs into the lungs every 4 (four) hours as needed for wheezing or shortness of breath.     VIIBRYD 40 MG Tabs  Generic drug:  Vilazodone HCl        Past Medical History  Diagnosis Date  . Asthma   . GERD (gastroesophageal reflux disease)   . Severe anemia [id number]    Past Surgical History  Procedure Laterality Date  . Caesarean    . Cholecystectomy    . Hip surgery Left     Allergies  Allergen Reactions  . Prednisone Hives and Other (See Comments)    Mood Swings.  . Codeine Anxiety    Review of systems negative except as noted in HPI / PMHx or noted below:  Review of Systems  Constitutional: Negative.   HENT: Negative.   Eyes: Negative.   Respiratory: Negative.   Cardiovascular: Negative.   Gastrointestinal: Negative.  Genitourinary: Negative.   Musculoskeletal: Negative.   Skin: Negative.   Neurological: Negative.   Endo/Heme/Allergies: Negative.   Psychiatric/Behavioral: Negative.      Objective:   Filed Vitals:   01/01/16 1622  BP: 130/82  Pulse: 100  Resp: 18          Physical Exam  Constitutional: She is well-developed, well-nourished, and in no distress.  HENT:  Head: Normocephalic.  Right Ear: Tympanic membrane, external ear and ear canal normal.  Left Ear: Tympanic membrane, external ear and ear canal normal.  Nose: Nose normal. No mucosal edema or rhinorrhea.  Mouth/Throat: Uvula is midline, oropharynx is clear and moist and mucous membranes are normal. No oropharyngeal exudate.  Eyes: Conjunctivae are normal.  Neck: Trachea normal. No tracheal tenderness  present. No tracheal deviation present. No thyromegaly present.  Cardiovascular: Normal rate, regular rhythm, S1 normal, S2 normal and normal heart sounds.   No murmur heard. Pulmonary/Chest: Breath sounds normal. No stridor. No respiratory distress. She has no wheezes. She has no rales.  Musculoskeletal: She exhibits no edema.  Lymphadenopathy:       Head (right side): No tonsillar adenopathy present.       Head (left side): No tonsillar adenopathy present.    She has no cervical adenopathy.  Neurological: She is alert. Gait normal.  Skin: No rash noted. She is not diaphoretic. No erythema. Nails show no clubbing.  Psychiatric: Mood and affect normal.    Diagnostics:    Spirometry was performed and demonstrated an FEV1 of 2.88 at 98 % of predicted.  The patient had an Asthma Control Test with the following results: ACT Total Score: 23.    Assessment and Plan:   1. Allergic rhinitis, unspecified allergic rhinitis type   2. Moderate persistent asthma, uncomplicated   3. Gastroesophageal reflux disease, esophagitis presence not specified     1. Qvar 80 2 inhalations one time per day. Increase to 3 inhalations 3 times per day during flare up  2. Qnasl 80 1-2 sprays each nostril 3-7 times per week depending on disease activity  3. Montelukast 10 mg daily  4. Nexium 40 g twice a day  5. Ventolin HFA and Claritin if needed  6. Continue immunotherapy and EpiPen  7. Return to clinic in 6 months or earlier if problem  8. Obtain flu vaccine this fall  Laurie Simmons appears to have very good control of her atopic disease on her current medical plan which includes anti-inflammatory medications for her respiratory tract and immunotherapy. We'll continue her on this plan and see her back in this clinic and approximately 6 months or earlier if there is a problem.  Laurie Katz, MD [place]

## 2016-01-01 NOTE — Patient Instructions (Signed)
  1. Qvar 80 2 inhalations one time per day. Increase to 3 inhalations 3 times per day during flare up  2. Qnasl 80 1-2 sprays each nostril 3-7 times per week depending on disease activity  3. Montelukast 10 mg daily  4. Nexium 40 g twice a day  5. Ventolin HFA and Claritin if needed  6. Continue immunotherapy and EpiPen  7. Return to clinic in 6 months or earlier if problem  8. Obtain flu vaccine this fall

## 2016-01-11 ENCOUNTER — Ambulatory Visit (INDEPENDENT_AMBULATORY_CARE_PROVIDER_SITE_OTHER): Payer: BC Managed Care – PPO | Admitting: *Deleted

## 2016-01-11 DIAGNOSIS — J309 Allergic rhinitis, unspecified: Secondary | ICD-10-CM

## 2016-01-25 ENCOUNTER — Ambulatory Visit (INDEPENDENT_AMBULATORY_CARE_PROVIDER_SITE_OTHER): Payer: BC Managed Care – PPO | Admitting: *Deleted

## 2016-01-25 DIAGNOSIS — J309 Allergic rhinitis, unspecified: Secondary | ICD-10-CM | POA: Diagnosis not present

## 2016-02-08 ENCOUNTER — Ambulatory Visit (INDEPENDENT_AMBULATORY_CARE_PROVIDER_SITE_OTHER): Payer: BC Managed Care – PPO | Admitting: *Deleted

## 2016-02-08 DIAGNOSIS — J309 Allergic rhinitis, unspecified: Secondary | ICD-10-CM

## 2016-02-16 ENCOUNTER — Other Ambulatory Visit: Payer: Self-pay | Admitting: *Deleted

## 2016-02-16 MED ORDER — ESOMEPRAZOLE MAGNESIUM 40 MG PO CPDR: 40.0000 mg | DELAYED_RELEASE_CAPSULE | Freq: Two times a day (BID) | ORAL | 5 refills | Status: DC

## 2016-02-19 ENCOUNTER — Other Ambulatory Visit: Payer: Self-pay | Admitting: *Deleted

## 2016-02-19 MED ORDER — ALBUTEROL SULFATE HFA 108 (90 BASE) MCG/ACT IN AERS: 2.0000 | INHALATION_SPRAY | RESPIRATORY_TRACT | 1 refills | Status: DC | PRN

## 2016-02-22 ENCOUNTER — Ambulatory Visit (INDEPENDENT_AMBULATORY_CARE_PROVIDER_SITE_OTHER): Payer: BC Managed Care – PPO

## 2016-02-22 DIAGNOSIS — J309 Allergic rhinitis, unspecified: Secondary | ICD-10-CM

## 2016-02-29 ENCOUNTER — Other Ambulatory Visit: Payer: Self-pay | Admitting: *Deleted

## 2016-02-29 ENCOUNTER — Ambulatory Visit (INDEPENDENT_AMBULATORY_CARE_PROVIDER_SITE_OTHER): Payer: BC Managed Care – PPO | Admitting: *Deleted

## 2016-02-29 DIAGNOSIS — J309 Allergic rhinitis, unspecified: Secondary | ICD-10-CM

## 2016-02-29 MED ORDER — BECLOMETHASONE DIPROPIONATE 80 MCG/ACT IN AERS: INHALATION_SPRAY | RESPIRATORY_TRACT | 3 refills | Status: DC

## 2016-03-07 ENCOUNTER — Ambulatory Visit (INDEPENDENT_AMBULATORY_CARE_PROVIDER_SITE_OTHER): Payer: BC Managed Care – PPO | Admitting: *Deleted

## 2016-03-07 DIAGNOSIS — J309 Allergic rhinitis, unspecified: Secondary | ICD-10-CM

## 2016-03-14 ENCOUNTER — Ambulatory Visit (INDEPENDENT_AMBULATORY_CARE_PROVIDER_SITE_OTHER): Payer: BC Managed Care – PPO | Admitting: *Deleted

## 2016-03-14 DIAGNOSIS — J309 Allergic rhinitis, unspecified: Secondary | ICD-10-CM

## 2016-03-21 ENCOUNTER — Ambulatory Visit (INDEPENDENT_AMBULATORY_CARE_PROVIDER_SITE_OTHER): Payer: BC Managed Care – PPO | Admitting: *Deleted

## 2016-03-21 DIAGNOSIS — J309 Allergic rhinitis, unspecified: Secondary | ICD-10-CM

## 2016-04-04 ENCOUNTER — Ambulatory Visit (INDEPENDENT_AMBULATORY_CARE_PROVIDER_SITE_OTHER): Payer: BC Managed Care – PPO | Admitting: *Deleted

## 2016-04-04 DIAGNOSIS — J309 Allergic rhinitis, unspecified: Secondary | ICD-10-CM | POA: Diagnosis not present

## 2016-04-25 ENCOUNTER — Ambulatory Visit (INDEPENDENT_AMBULATORY_CARE_PROVIDER_SITE_OTHER): Payer: BC Managed Care – PPO | Admitting: *Deleted

## 2016-04-25 DIAGNOSIS — J309 Allergic rhinitis, unspecified: Secondary | ICD-10-CM

## 2016-04-30 DIAGNOSIS — J3089 Other allergic rhinitis: Secondary | ICD-10-CM | POA: Diagnosis not present

## 2016-05-01 DIAGNOSIS — J301 Allergic rhinitis due to pollen: Secondary | ICD-10-CM | POA: Diagnosis not present

## 2016-05-09 ENCOUNTER — Ambulatory Visit (INDEPENDENT_AMBULATORY_CARE_PROVIDER_SITE_OTHER): Payer: BC Managed Care – PPO | Admitting: *Deleted

## 2016-05-09 DIAGNOSIS — J309 Allergic rhinitis, unspecified: Secondary | ICD-10-CM

## 2016-05-23 ENCOUNTER — Ambulatory Visit (INDEPENDENT_AMBULATORY_CARE_PROVIDER_SITE_OTHER): Payer: BC Managed Care – PPO | Admitting: *Deleted

## 2016-05-23 DIAGNOSIS — J309 Allergic rhinitis, unspecified: Secondary | ICD-10-CM | POA: Diagnosis not present

## 2016-06-01 ENCOUNTER — Other Ambulatory Visit: Payer: Self-pay | Admitting: Allergy and Immunology

## 2016-06-03 ENCOUNTER — Ambulatory Visit (INDEPENDENT_AMBULATORY_CARE_PROVIDER_SITE_OTHER): Payer: BC Managed Care – PPO | Admitting: *Deleted

## 2016-06-03 DIAGNOSIS — J309 Allergic rhinitis, unspecified: Secondary | ICD-10-CM | POA: Diagnosis not present

## 2016-06-20 ENCOUNTER — Ambulatory Visit (INDEPENDENT_AMBULATORY_CARE_PROVIDER_SITE_OTHER): Payer: BC Managed Care – PPO

## 2016-06-20 DIAGNOSIS — J309 Allergic rhinitis, unspecified: Secondary | ICD-10-CM | POA: Diagnosis not present

## 2016-06-27 ENCOUNTER — Ambulatory Visit (INDEPENDENT_AMBULATORY_CARE_PROVIDER_SITE_OTHER): Payer: BC Managed Care – PPO | Admitting: *Deleted

## 2016-06-27 DIAGNOSIS — J309 Allergic rhinitis, unspecified: Secondary | ICD-10-CM

## 2016-07-03 ENCOUNTER — Ambulatory Visit (INDEPENDENT_AMBULATORY_CARE_PROVIDER_SITE_OTHER): Payer: BC Managed Care – PPO | Admitting: Allergy and Immunology

## 2016-07-03 ENCOUNTER — Encounter: Payer: Self-pay | Admitting: Allergy and Immunology

## 2016-07-03 VITALS — BP 122/84 | HR 88 | Resp 18

## 2016-07-03 DIAGNOSIS — K219 Gastro-esophageal reflux disease without esophagitis: Secondary | ICD-10-CM

## 2016-07-03 DIAGNOSIS — J3089 Other allergic rhinitis: Secondary | ICD-10-CM

## 2016-07-03 DIAGNOSIS — L501 Idiopathic urticaria: Secondary | ICD-10-CM | POA: Diagnosis not present

## 2016-07-03 DIAGNOSIS — J4541 Moderate persistent asthma with (acute) exacerbation: Secondary | ICD-10-CM | POA: Diagnosis not present

## 2016-07-03 MED ORDER — MONTELUKAST SODIUM 10 MG PO TABS: ORAL_TABLET | ORAL | 5 refills | Status: DC

## 2016-07-03 NOTE — Patient Instructions (Addendum)
  1. Action plan for asthma flare up including Qvar 80 3 inhalations 3 times per day   2. Nasal saline few times a day while 'sick'  3. Use Qnasl 2 puffs each nostril one time per day while 'sick'  4. mucinex DM 1-2 tablets two times a day while 'sick'  5. Prednisone 10mg  one time a day for five days only   6. Continue Montelukast 10 mg daily  7. Continue Nexium 40 g twice a day  8. Continue Ventolin HFA and Claritin if needed  9. Continue immunotherapy and EpiPen  10. Return to clinic in 6 months or earlier if problem

## 2016-07-03 NOTE — Progress Notes (Signed)
Follow-up Note  Referring Provider: Marco Collie, MD Primary Provider: Marco Collie, MD Date of Office Visit: 07/03/2016  Subjective:   Laurie Simmons (DOB: [birth year]) is a 50 y.o. female who returns to the [facility] on 07/03/2016 in re-evaluation of the following:  HPI: Adashia returns to this clinic in reevaluation of her asthma and allergic rhinitis treated with immunotherapy and history of LPR and intermittent urticaria. I last saw her in his clinic in June 2017.  She was doing quite well regarding her atopic respiratory disease and did not require systemic steroid or an antibiotic during the interval. Rarely does she use a short acting bronchodilator. Her urticaria is been under excellent control. Her reflux is really been doing quite well.  However, around mid November or close to Thanksgiving she ended up in the urgent care center with "light bronchitis" manifested as coughing and postnasal drip and nasal congestion for which she may been given an antibiotic. Ever since then she's had throat clearing and postnasal drip and a small amount of cough in the morning. She is not had any ugly nasal discharge or headaches or anosmia or or ugly sputum production or chest pain.  For immunotherapy is going quite well. She has not had an adverse effects secondary to the administration of this treatment.  She did receive the flu vaccine this year.  Allergies as of 07/03/2016      Reactions   Prednisone Hives, Other (See Comments)   Mood Swings.   Codeine Anxiety      Medication List       Accurate as of 07/03/16  4:28 PM. Always use your most recent med list.          albuterol 108 (90 Base) MCG/ACT inhaler Commonly known as:  VENTOLIN HFA Inhale 2 puffs into the lungs every 4 (four) hours as needed for wheezing or shortness of breath.   beclomethasone 80 MCG/ACT inhaler Commonly known as:  QVAR Inhale to puffs twice daily to prevent cough or wheeze. Rinse mouth  after use.   EPIPEN 2-PAK 0.3 mg/0.3 mL Soaj injection Generic drug:  EPINEPHrine Use as directed for life-threatening allergic reaction.   esomeprazole 40 MG capsule Commonly known as:  NEXIUM Take 1 capsule (40 mg total) by mouth 2 (two) times daily before a meal.   ferrous sulfate 325 (65 FE) MG EC tablet Take 325 mg by mouth 3 (three) times daily with meals.   LATUDA 40 MG Tabs tablet Generic drug:  lurasidone Take by mouth daily.   montelukast 10 MG tablet Commonly known as:  SINGULAIR Take 10 mg by mouth at bedtime.   QNASL 80 MCG/ACT Aers Generic drug:  Beclomethasone Dipropionate INSTILL 1 to 2 sprays into each nostril once daily for STUFFY NOSE OR DRAINAGE   valsartan-hydrochlorothiazide 320-25 MG tablet Commonly known as:  DIOVAN-HCT Take 1 tablet by mouth daily.   VIIBRYD 40 MG Tabs Generic drug:  Vilazodone HCl       Past Medical History:  Diagnosis Date  . Asthma   . GERD (gastroesophageal reflux disease)   . Severe anemia [id number]    Past Surgical History:  Procedure Laterality Date  . caesarean    . CHOLECYSTECTOMY    . HIP SURGERY Left     Review of systems negative except as noted in HPI / PMHx or noted below:  Review of Systems  Constitutional: Negative.   HENT: Negative.   Eyes: Negative.   Respiratory: Negative.  Cardiovascular: Negative.   Gastrointestinal: Negative.   Genitourinary: Negative.   Musculoskeletal: Negative.   Skin: Negative.   Neurological: Negative.   Endo/Heme/Allergies: Negative.   Psychiatric/Behavioral: Negative.      Objective:   Vitals:   07/03/16 1615  BP: 122/84  Pulse: 88  Resp: 18          Physical Exam  Constitutional: She is well-developed, well-nourished, and in no distress.  HENT:  Head: Normocephalic.  Right Ear: Tympanic membrane, external ear and ear canal normal.  Left Ear: Tympanic membrane, external ear and ear canal normal.  Nose: Mucosal edema present. No rhinorrhea.    Mouth/Throat: Uvula is midline, oropharynx is clear and moist and mucous membranes are normal. No oropharyngeal exudate.  Eyes: Conjunctivae are normal.  Neck: Trachea normal. No tracheal tenderness present. No tracheal deviation present. No thyromegaly present.  Cardiovascular: Normal rate, regular rhythm, S1 normal, S2 normal and normal heart sounds.   No murmur heard. Pulmonary/Chest: Breath sounds normal. No stridor. No respiratory distress. She has no wheezes. She has no rales.  Musculoskeletal: She exhibits no edema.  Lymphadenopathy:       Head (right side): No tonsillar adenopathy present.       Head (left side): No tonsillar adenopathy present.    She has no cervical adenopathy.  Neurological: She is alert. Gait normal.  Skin: No rash noted. She is not diaphoretic. No erythema. Nails show no clubbing.  Psychiatric: Mood and affect normal.    Diagnostics:    Spirometry was performed and demonstrated an FEV1 of 2.58 at 2.87 % of predicted.  The patient had an Asthma Control Test with the following results: ACT Total Score: 24.    Assessment and Plan:   1. Asthma, not well controlled, moderate persistent, with acute exacerbation   2. Other allergic rhinitis   3. Idiopathic urticaria   4. Gastroesophageal reflux disease, esophagitis presence not specified     1. Action plan for asthma flare up including Qvar 80 3 inhalations 3 times per day   2. Nasal saline few times a day while 'sick'  3. Use Qnasl 2 puffs each nostril one time per day while 'sick'  4. mucinex DM 1-2 tablets two times a day while 'sick'  5. Prednisone 10mg  one time a day for five days only   6. Continue Montelukast 10 mg daily  7. Continue Nexium 40 g twice a day  8. Continue Ventolin HFA and Claritin if needed  9. Continue immunotherapy and EpiPen  10. Return to clinic in 6 months or earlier if problem  Laurelle appears to have respiratory tract inflammation most likely from the episode of  "bronchitis" that she contracted about 3 weeks ago or so. I will give her a very low dose of systemic steroids today and have her activate an action plan including inhaled steroids to see if we can eliminate all the inflammation from her respiratory tract. If she does well I will see her back in this clinic in 6 months or earlier if there is a problem.  Allena Katz, MD [place]

## 2016-07-04 ENCOUNTER — Other Ambulatory Visit: Payer: Self-pay | Admitting: *Deleted

## 2016-07-04 ENCOUNTER — Ambulatory Visit (INDEPENDENT_AMBULATORY_CARE_PROVIDER_SITE_OTHER): Payer: BC Managed Care – PPO | Admitting: *Deleted

## 2016-07-04 DIAGNOSIS — J309 Allergic rhinitis, unspecified: Secondary | ICD-10-CM

## 2016-07-04 MED ORDER — EPINEPHRINE 0.3 MG/0.3ML IJ SOAJ: 0.3000 mg | Freq: Once | INTRAMUSCULAR | 2 refills | Status: AC

## 2016-07-11 ENCOUNTER — Ambulatory Visit (INDEPENDENT_AMBULATORY_CARE_PROVIDER_SITE_OTHER): Payer: BC Managed Care – PPO | Admitting: *Deleted

## 2016-07-11 DIAGNOSIS — J309 Allergic rhinitis, unspecified: Secondary | ICD-10-CM

## 2016-07-25 ENCOUNTER — Ambulatory Visit (INDEPENDENT_AMBULATORY_CARE_PROVIDER_SITE_OTHER): Payer: BC Managed Care – PPO | Admitting: *Deleted

## 2016-07-25 DIAGNOSIS — J309 Allergic rhinitis, unspecified: Secondary | ICD-10-CM | POA: Diagnosis not present

## 2016-08-08 ENCOUNTER — Ambulatory Visit (INDEPENDENT_AMBULATORY_CARE_PROVIDER_SITE_OTHER): Payer: BC Managed Care – PPO | Admitting: *Deleted

## 2016-08-08 DIAGNOSIS — J309 Allergic rhinitis, unspecified: Secondary | ICD-10-CM | POA: Diagnosis not present

## 2016-08-09 NOTE — Addendum Note (Signed)
Addended by: Felipa Emory on: 08/09/2016 03:33 PM   Modules accepted: Orders

## 2016-08-15 ENCOUNTER — Ambulatory Visit (INDEPENDENT_AMBULATORY_CARE_PROVIDER_SITE_OTHER): Payer: BC Managed Care – PPO | Admitting: *Deleted

## 2016-08-15 DIAGNOSIS — J309 Allergic rhinitis, unspecified: Secondary | ICD-10-CM | POA: Diagnosis not present

## 2016-08-30 ENCOUNTER — Ambulatory Visit (INDEPENDENT_AMBULATORY_CARE_PROVIDER_SITE_OTHER): Payer: BC Managed Care – PPO | Admitting: *Deleted

## 2016-08-30 DIAGNOSIS — J309 Allergic rhinitis, unspecified: Secondary | ICD-10-CM | POA: Diagnosis not present

## 2016-09-12 ENCOUNTER — Ambulatory Visit (INDEPENDENT_AMBULATORY_CARE_PROVIDER_SITE_OTHER): Payer: BC Managed Care – PPO | Admitting: *Deleted

## 2016-09-12 DIAGNOSIS — J309 Allergic rhinitis, unspecified: Secondary | ICD-10-CM | POA: Diagnosis not present

## 2016-09-17 DIAGNOSIS — J301 Allergic rhinitis due to pollen: Secondary | ICD-10-CM | POA: Diagnosis not present

## 2016-09-18 DIAGNOSIS — J3089 Other allergic rhinitis: Secondary | ICD-10-CM | POA: Diagnosis not present

## 2016-09-26 ENCOUNTER — Ambulatory Visit (INDEPENDENT_AMBULATORY_CARE_PROVIDER_SITE_OTHER): Payer: BC Managed Care – PPO | Admitting: *Deleted

## 2016-09-26 DIAGNOSIS — J309 Allergic rhinitis, unspecified: Secondary | ICD-10-CM | POA: Diagnosis not present

## 2016-10-10 ENCOUNTER — Ambulatory Visit (INDEPENDENT_AMBULATORY_CARE_PROVIDER_SITE_OTHER): Payer: BC Managed Care – PPO | Admitting: *Deleted

## 2016-10-10 DIAGNOSIS — J309 Allergic rhinitis, unspecified: Secondary | ICD-10-CM

## 2016-10-18 ENCOUNTER — Ambulatory Visit (INDEPENDENT_AMBULATORY_CARE_PROVIDER_SITE_OTHER): Payer: BC Managed Care – PPO | Admitting: *Deleted

## 2016-10-18 DIAGNOSIS — J309 Allergic rhinitis, unspecified: Secondary | ICD-10-CM | POA: Diagnosis not present

## 2016-10-24 ENCOUNTER — Ambulatory Visit (INDEPENDENT_AMBULATORY_CARE_PROVIDER_SITE_OTHER): Payer: BC Managed Care – PPO | Admitting: *Deleted

## 2016-10-24 ENCOUNTER — Encounter: Payer: Self-pay | Admitting: Allergy and Immunology

## 2016-10-24 DIAGNOSIS — J309 Allergic rhinitis, unspecified: Secondary | ICD-10-CM | POA: Diagnosis not present

## 2016-11-07 ENCOUNTER — Ambulatory Visit (INDEPENDENT_AMBULATORY_CARE_PROVIDER_SITE_OTHER): Payer: BC Managed Care – PPO | Admitting: *Deleted

## 2016-11-07 DIAGNOSIS — J309 Allergic rhinitis, unspecified: Secondary | ICD-10-CM

## 2016-11-15 ENCOUNTER — Ambulatory Visit (INDEPENDENT_AMBULATORY_CARE_PROVIDER_SITE_OTHER): Payer: BC Managed Care – PPO | Admitting: *Deleted

## 2016-11-15 DIAGNOSIS — J309 Allergic rhinitis, unspecified: Secondary | ICD-10-CM

## 2016-11-21 ENCOUNTER — Ambulatory Visit (INDEPENDENT_AMBULATORY_CARE_PROVIDER_SITE_OTHER): Payer: BC Managed Care – PPO | Admitting: *Deleted

## 2016-11-21 DIAGNOSIS — J309 Allergic rhinitis, unspecified: Secondary | ICD-10-CM

## 2016-12-12 ENCOUNTER — Ambulatory Visit (INDEPENDENT_AMBULATORY_CARE_PROVIDER_SITE_OTHER): Payer: BC Managed Care – PPO

## 2016-12-12 DIAGNOSIS — J309 Allergic rhinitis, unspecified: Secondary | ICD-10-CM

## 2016-12-16 ENCOUNTER — Other Ambulatory Visit: Payer: Self-pay | Admitting: *Deleted

## 2016-12-16 MED ORDER — FLUTICASONE PROPIONATE HFA 110 MCG/ACT IN AERO: INHALATION_SPRAY | RESPIRATORY_TRACT | 0 refills | Status: DC

## 2016-12-16 NOTE — Telephone Encounter (Signed)
Replacement for Qvar sent to [facility]

## 2016-12-24 DIAGNOSIS — J301 Allergic rhinitis due to pollen: Secondary | ICD-10-CM | POA: Diagnosis not present

## 2016-12-24 NOTE — Progress Notes (Signed)
Vials made 12-25-16  jm

## 2017-01-02 ENCOUNTER — Encounter: Payer: Self-pay | Admitting: Allergy and Immunology

## 2017-01-02 ENCOUNTER — Ambulatory Visit (INDEPENDENT_AMBULATORY_CARE_PROVIDER_SITE_OTHER): Payer: BC Managed Care – PPO | Admitting: Allergy and Immunology

## 2017-01-02 ENCOUNTER — Ambulatory Visit: Payer: Self-pay

## 2017-01-02 VITALS — BP 124/92 | HR 80 | Resp 16

## 2017-01-02 DIAGNOSIS — J3089 Other allergic rhinitis: Secondary | ICD-10-CM

## 2017-01-02 DIAGNOSIS — K120 Recurrent oral aphthae: Secondary | ICD-10-CM

## 2017-01-02 DIAGNOSIS — K219 Gastro-esophageal reflux disease without esophagitis: Secondary | ICD-10-CM | POA: Diagnosis not present

## 2017-01-02 DIAGNOSIS — J4540 Moderate persistent asthma, uncomplicated: Secondary | ICD-10-CM

## 2017-01-02 DIAGNOSIS — J309 Allergic rhinitis, unspecified: Secondary | ICD-10-CM

## 2017-01-02 MED ORDER — AZELASTINE HCL 0.15 % NA SOLN: NASAL | 5 refills | Status: DC

## 2017-01-02 MED ORDER — VALACYCLOVIR HCL 500 MG PO TABS: ORAL_TABLET | ORAL | 0 refills | Status: DC

## 2017-01-02 MED ORDER — RANITIDINE HCL 300 MG PO TABS: 300.0000 mg | ORAL_TABLET | Freq: Every day | ORAL | 1 refills | Status: DC

## 2017-01-02 MED ORDER — BUDESONIDE-FORMOTEROL FUMARATE 160-4.5 MCG/ACT IN AERO: INHALATION_SPRAY | RESPIRATORY_TRACT | 0 refills | Status: DC

## 2017-01-02 NOTE — Patient Instructions (Addendum)
  1. Start a sample of Symbicort 160 2 inhalations twice a day with spacer to replace Flovent for the next month  2. Start Valtrex 500 mg once a day for the next 5 days  3. Start ranitidine 300 mg once a day in the evening  4. Consolidate all caffeine consumption aiming for none  5. Continue Qnasl 1- 2 puffs each nostril one time per day   6. Continue Nexium 40 g twice a day  7. Continue Ventolin HFA if needed  8. Continue nasal Azelastine, OTC antihistamine, nasal saline, if needed  9. Continue immunotherapy and EpiPen  10. Return to clinic in 4 weeks or earlier if problem

## 2017-01-02 NOTE — Progress Notes (Signed)
Follow-up Note  Referring Provider: Marco Collie, MD  Primary Provider: Marco Collie, MD Date of Office Visit: 01/02/2017  Subjective:   Laurie Simmons (DOB: 11-Jun-1966) is a 52 y.o. female who returns to the [facility] [place] on 01/02/2017 in re-evaluation of the following:  HPI: Laurie Simmons returns to this clinic in reevaluation of her asthma and allergic rhinitis and LPR and history of intermittent urticaria. I last saw her in this clinic in December 2017.  She was doing quite well until approximately one month ago. At that point time she developed lots of postnasal drip and throat clearing and feeling something in her throat. She has been having some sniffling but not a significant amount of nasal congestion nor anosmia or ugly nasal discharge.  She has been having lots of burping and burning and a bubble in her throat over the course of the past month as well. She still continues to drink 2 caffeinated drinks per day.  She has also noticed that she may have a little bit more shortness of breath and she has been using her Qvar consistently over the course of the past month. Over the course of the past week she had to change Qvar to Flovent because of an insurance issue.  As well, she ended up in the urgent care center several weeks back because she developed "ulcers" in her mouth and she was given some type of mouth wash to use. This ulcer issue did appear to correlate with the use of BuSpar and she has discontinued her BuSpar over the course of the past week.  She continues on immunotherapy which she is presently using every 3 weeks. She has not had an adverse effects secondary to the administration of this form of treatment.  Allergies as of 01/02/2017      Reactions   Prednisone Hives, Other (See Comments)   Mood Swings.   Codeine Anxiety      Medication List      albuterol 108 (90 Base) MCG/ACT inhaler Commonly known as:  VENTOLIN HFA Inhale 2 puffs into the lungs every 4  (four) hours as needed for wheezing or shortness of breath.   esomeprazole 40 MG capsule Commonly known as:  NEXIUM Take 1 capsule (40 mg total) by mouth 2 (two) times daily before a meal.   ferrous sulfate 325 (65 FE) MG EC tablet Take 325 mg by mouth 3 (three) times daily with meals.   fluticasone 110 MCG/ACT inhaler Commonly known as:  FLOVENT HFA Inhale 3 puffs 3 times daily for asthma flareup   QNASL 80 MCG/ACT Aers Generic drug:  Beclomethasone Dipropionate INSTILL 1 to 2 sprays into each nostril once daily for STUFFY NOSE OR DRAINAGE   topiramate 25 MG tablet Commonly known as:  TOPAMAX 2 (two) times daily.   valsartan-hydrochlorothiazide 320-25 MG tablet Commonly known as:  DIOVAN-HCT Take 1 tablet by mouth daily.   VIIBRYD 40 MG Tabs Generic drug:  Vilazodone HCl daily.       Past Medical History:  Diagnosis Date  . Asthma   . GERD (gastroesophageal reflux disease)   . Severe anemia [id number]    Past Surgical History:  Procedure Laterality Date  . caesarean    . CHOLECYSTECTOMY    . HIP SURGERY Left     Review of systems negative except as noted in HPI / PMHx or noted below:  Review of Systems  Constitutional: Negative.   HENT: Negative.   Eyes: Negative.   Respiratory:  Negative.   Cardiovascular: Negative.   Gastrointestinal: Negative.   Genitourinary: Negative.   Musculoskeletal: Negative.   Skin: Negative.   Neurological: Negative.   Endo/Heme/Allergies: Negative.   Psychiatric/Behavioral: Negative.      Objective:   Vitals:   01/02/17 1002  BP: (!) 124/92  Pulse: 80  Resp: 16          Physical Exam  Constitutional: She is well-developed, well-nourished, and in no distress.  HENT:  Head: Normocephalic.  Right Ear: Tympanic membrane, external ear and ear canal normal.  Left Ear: Tympanic membrane, external ear and ear canal normal.  Nose: Nose normal. No mucosal edema or rhinorrhea.  Mouth/Throat: Uvula is midline and mucous  membranes are normal. Posterior oropharyngeal erythema (small aphthous ulcer lateral tongue right) present. No oropharyngeal exudate.  Eyes: Conjunctivae are normal.  Neck: Trachea normal. No tracheal tenderness present. No tracheal deviation present. No thyromegaly present.  Cardiovascular: Normal rate, regular rhythm, S1 normal, S2 normal and normal heart sounds.   No murmur heard. Pulmonary/Chest: Breath sounds normal. No stridor. No respiratory distress. She has no wheezes. She has no rales.  Musculoskeletal: She exhibits no edema.  Lymphadenopathy:       Head (right side): No tonsillar adenopathy present.       Head (left side): No tonsillar adenopathy present.    She has no cervical adenopathy.  Neurological: She is alert. Gait normal.  Skin: No rash noted. She is not diaphoretic. No erythema. Nails show no clubbing.  Psychiatric: Mood and affect normal.    Diagnostics:    Spirometry was performed and demonstrated an FEV1 of 2.63 at 93 % of predicted.  The patient had an Asthma Control Test with the following results: ACT Total Score: 22.    Assessment and Plan:   1. Not well controlled moderate persistent asthma   2. Other allergic rhinitis   3. LPRD (laryngopharyngeal reflux disease)   4. Aphthous stomatitis     1. Start a sample of Symbicort 160 2 inhalations twice a day with spacer to replace Flovent for the next month  2. Start Valtrex 500 mg once a day for the next 5 days  3. Start ranitidine 300 mg once a day in the evening  4. Consolidate all caffeine consumption aiming for none  5. Continue Qnasl 1- 2 puffs each nostril one time per day   6. Continue Nexium 40 g twice a day  7. Continue Ventolin HFA if needed  8. Continue nasal Azelastine, OTC antihistamine, nasal saline, if needed  9. Continue immunotherapy and EpiPen  10. Return to clinic in 4 weeks or earlier if problem  I will treat Laurie Simmons with a little bit more anti-inflammatory medication for her  respiratory tract as noted above and also get her to be a little more aggressive about treating her reflux and LPR which includes the addition of a H2 receptor blocker and consolidation of her daily caffeine use. As well, for her aphthous stomatitis we will give her a trial of Valtrex. This condition may be secondary to her use of BuSpar but I think it would be worthwhile to empirically treat for herpetic infection with a very safe medication to see if this also helps her issue. I will see her back in this clinic in 4 weeks or earlier if there is a problem. If she does well we will remove her Symbicort and placed her back on a ICS as monotherapy.  Allena Katz, MD Allergy / Immunology [facility] 

## 2017-01-22 ENCOUNTER — Telehealth: Payer: Self-pay | Admitting: Allergy and Immunology

## 2017-01-22 NOTE — Telephone Encounter (Signed)
Please have her come in for a clinic visit. I think she had a 1 month follow-up so this would be just a little bit early. She should've responded to Symbicort so there may be some other treatment we can try.

## 2017-01-22 NOTE — Telephone Encounter (Signed)
Patient has been scheduled for 01-23-17 at 300pm

## 2017-01-22 NOTE — Telephone Encounter (Signed)
Laurie Simmons called in and stated that the new inhaler she was given is not working.  Laurie Simmons is still experiencing shortness of breath.  Laurie Simmons states it is hard for her to breath and catch her breath at times.  Please advise.

## 2017-01-23 ENCOUNTER — Encounter: Payer: Self-pay | Admitting: Allergy and Immunology

## 2017-01-23 ENCOUNTER — Ambulatory Visit: Payer: Self-pay

## 2017-01-23 ENCOUNTER — Ambulatory Visit (INDEPENDENT_AMBULATORY_CARE_PROVIDER_SITE_OTHER): Payer: BC Managed Care – PPO | Admitting: Allergy and Immunology

## 2017-01-23 VITALS — BP 132/70 | HR 100 | Resp 18

## 2017-01-23 DIAGNOSIS — J3089 Other allergic rhinitis: Secondary | ICD-10-CM

## 2017-01-23 DIAGNOSIS — K219 Gastro-esophageal reflux disease without esophagitis: Secondary | ICD-10-CM | POA: Diagnosis not present

## 2017-01-23 DIAGNOSIS — J309 Allergic rhinitis, unspecified: Secondary | ICD-10-CM

## 2017-01-23 DIAGNOSIS — J4540 Moderate persistent asthma, uncomplicated: Secondary | ICD-10-CM | POA: Diagnosis not present

## 2017-01-23 MED ORDER — METHYLPREDNISOLONE ACETATE 80 MG/ML IJ SUSP
80.0000 mg | Freq: Once | INTRAMUSCULAR | Status: AC
  Administered 2017-01-23: 80 mg via INTRAMUSCULAR

## 2017-01-23 NOTE — Progress Notes (Signed)
Follow-up Note  Referring Provider: Marco Collie, MD Primary Provider: Marco Collie, MD Date of Office Visit: 01/23/2017  Subjective:   Laurie Simmons (DOB: February 09, 1966) is a 51 y.o. female who returns to the [facility] [place] on 01/23/2017 in re-evaluation of the following:  HPI: Evin presents to this clinic in reevaluation of her respiratory tract problems. She is presently being treated for asthma and allergic rhinitis and LPR and also has a history of intermittent urticaria and her last visit to this clinic was 03 January 2017 at which point in time I had her start a sample of Symbicort and also had her add ranitidine to her reflux therapy and also had her use a course of Valtrex for what appeared to be aphthous stomatitis.  She can't really say she is much better regarding her breathing. She still has some shortness of breath and still feels this "bubble in her throat" and occasionally still has some burning in her throat. She has some dyspnea on exertion and that she does get somewhat short of breath when exerting herself. She has not been having any chest pain or coughing or sputum production or hemoptysis and she has no issues with her legs.  Allergies as of 01/23/2017      Reactions   Prednisone Hives, Other (See Comments)   Mood Swings.   Codeine Anxiety      Medication List      albuterol 108 (90 Base) MCG/ACT inhaler Commonly known as:  VENTOLIN HFA Inhale 2 puffs into the lungs every 4 (four) hours as needed for wheezing or shortness of breath.   Azelastine HCl 0.15 % Soln Can use one to two sprays in each nostril one to two times daily if needed.   budesonide-formoterol 160-4.5 MCG/ACT inhaler Commonly known as:  SYMBICORT Inhale two puffs twice daily to prevent cough or wheeze.  Rinse, gargle, and spit after use. Use with spacer.   esomeprazole 40 MG capsule Commonly known as:  NEXIUM Take 1 capsule (40 mg total) by mouth 2 (two) times daily before a meal.   ferrous sulfate 325 (65 FE) MG EC tablet Take 325 mg by mouth 3 (three) times daily with meals.   fluticasone 110 MCG/ACT inhaler Commonly known as:  FLOVENT HFA Inhale 3 puffs 3 times daily for asthma flareup   QNASL 80 MCG/ACT Aers Generic drug:  Beclomethasone Dipropionate INSTILL 1 to 2 sprays into each nostril once daily for STUFFY NOSE OR DRAINAGE   ranitidine 300 MG tablet Commonly known as:  ZANTAC Take 1 tablet (300 mg total) by mouth at bedtime.   topiramate 25 MG tablet Commonly known as:  TOPAMAX 2 (two) times daily.   valACYclovir 500 MG tablet Commonly known as:  VALTREX Take one tablet by mouth once daily for five days.   valsartan-hydrochlorothiazide 320-25 MG tablet Commonly known as:  DIOVAN-HCT Take 1 tablet by mouth daily.   VIIBRYD 40 MG Tabs Generic drug:  Vilazodone HCl daily.       Past Medical History:  Diagnosis Date  . Asthma   . GERD (gastroesophageal reflux disease)   . Severe anemia [id number]    Past Surgical History:  Procedure Laterality Date  . caesarean    . CHOLECYSTECTOMY    . HIP SURGERY Left     Review of systems negative except as noted in HPI / PMHx or noted below:  Review of Systems  Constitutional: Negative.   HENT: Negative.   Eyes: Negative.  Respiratory: Negative.   Cardiovascular: Negative.   Gastrointestinal: Negative.   Genitourinary: Negative.   Musculoskeletal: Negative.   Skin: Negative.   Neurological: Negative.   Endo/Heme/Allergies: Negative.   Psychiatric/Behavioral: Negative.      Objective:   Vitals:   01/23/17 1448  BP: 132/70  Pulse: 100  Resp: 18          Physical Exam  Constitutional: She is well-developed, well-nourished, and in no distress.  HENT:  Head: Normocephalic.  Right Ear: Tympanic membrane, external ear and ear canal normal.  Left Ear: Tympanic membrane, external ear and ear canal normal.  Nose: Nose normal. No mucosal edema or rhinorrhea.  Mouth/Throat: Uvula is  midline, oropharynx is clear and moist and mucous membranes are normal. No oropharyngeal exudate.  Eyes: Conjunctivae are normal.  Neck: Trachea normal. No tracheal tenderness present. No tracheal deviation present. No thyromegaly present.  Cardiovascular: Normal rate, regular rhythm, S1 normal, S2 normal and normal heart sounds.   No murmur heard. Pulmonary/Chest: Breath sounds normal. No stridor. No respiratory distress. She has no wheezes. She has no rales.  Musculoskeletal: She exhibits no edema.  Lymphadenopathy:       Head (right side): No tonsillar adenopathy present.       Head (left side): No tonsillar adenopathy present.    She has no cervical adenopathy.  Neurological: She is alert. Gait normal.  Skin: No rash noted. She is not diaphoretic. No erythema. Nails show no clubbing.  Psychiatric: Mood and affect normal.    Diagnostics: Oxygen saturation at rest on room air was 97%. With walking up and down the hallway her oxygen saturation remained at 95-96%.   Spirometry was performed and demonstrated an FEV1 of 2.68 at 94 % of predicted.  The patient had an Asthma Control Test with the following results: ACT Total Score: 15.    Assessment and Plan:   1. Not well controlled moderate persistent asthma   2. Other allergic rhinitis   3. LPRD (laryngopharyngeal reflux disease)     1. Continue Symbicort 160 2 inhalations twice a day   2. Continue ranitidine 300 mg once a day in the evening  4. Consolidate all caffeine consumption aiming for none  5. Continue Qnasl 1- 2 puffs each nostril one time per day   6. Continue Nexium 40 g twice a day  7. Continue Ventolin HFA if needed  8. Continue nasal Azelastine, OTC antihistamine, nasal saline, if needed  9. Continue immunotherapy and EpiPen  10. Depo-Medrol 80 IM delivered in clinic today  11. Further evaluation and treatment?  12. Return to clinic in 12 weeks or earlier if problem  I am not exactly sure why Calaya has  her respiratory tract complaints but I am going to treat her empirically with Depo-Medrol assuming that there is an inflammatory component giving rise to these symptoms. I did have a talk with her today about blood sugar in the use of systemic steroids and she will need to monitor that issue as the administration of prednisone in the past did raise her blood sugar. If she still remains with respiratory tract issues then I am going to obtain a imaging procedures of her respiratory tract and consider further evaluation for a blood flow issue either through her heart or to her lungs.  Allena Katz, MD Allergy / Immunology [place]

## 2017-01-23 NOTE — Patient Instructions (Addendum)
  1. Continue Symbicort 160 2 inhalations twice a day   2. Continue ranitidine 300 mg once a day in the evening  4. Consolidate all caffeine consumption aiming for none  5. Continue Qnasl 1- 2 puffs each nostril one time per day   6. Continue Nexium 40 g twice a day  7. Continue Ventolin HFA if needed  8. Continue nasal Azelastine, OTC antihistamine, nasal saline, if needed  9. Continue immunotherapy and EpiPen  10. Depo-Medrol 80 IM delivered in clinic today  11. Further evaluation and treatment?  12. Return to clinic in 12 weeks or earlier if problem

## 2017-01-31 ENCOUNTER — Ambulatory Visit: Payer: Self-pay | Admitting: *Deleted

## 2017-01-31 ENCOUNTER — Ambulatory Visit: Payer: BC Managed Care – PPO | Admitting: Allergy and Immunology

## 2017-02-14 ENCOUNTER — Ambulatory Visit (INDEPENDENT_AMBULATORY_CARE_PROVIDER_SITE_OTHER): Payer: BC Managed Care – PPO | Admitting: *Deleted

## 2017-02-14 DIAGNOSIS — J309 Allergic rhinitis, unspecified: Secondary | ICD-10-CM | POA: Diagnosis not present

## 2017-02-18 ENCOUNTER — Other Ambulatory Visit: Payer: Self-pay | Admitting: Allergy and Immunology

## 2017-02-21 ENCOUNTER — Telehealth: Payer: Self-pay | Admitting: Allergy and Immunology

## 2017-02-21 MED ORDER — BUDESONIDE-FORMOTEROL FUMARATE 160-4.5 MCG/ACT IN AERO: INHALATION_SPRAY | RESPIRATORY_TRACT | 4 refills | Status: DC

## 2017-02-21 NOTE — Telephone Encounter (Signed)
Laurie Simmons called in and stated she is out of her Symbicort sample and would like a prescription for Symbicort sent to [facility] in [place].

## 2017-02-21 NOTE — Telephone Encounter (Signed)
Will send Rx to [facility] but may have to change if Ins does not pay for same.

## 2017-02-27 ENCOUNTER — Other Ambulatory Visit: Payer: Self-pay | Admitting: *Deleted

## 2017-02-27 MED ORDER — BUDESONIDE-FORMOTEROL FUMARATE 160-4.5 MCG/ACT IN AERO: INHALATION_SPRAY | RESPIRATORY_TRACT | 4 refills | Status: DC

## 2017-02-28 DIAGNOSIS — I951 Orthostatic hypotension: Secondary | ICD-10-CM

## 2017-02-28 DIAGNOSIS — R197 Diarrhea, unspecified: Secondary | ICD-10-CM

## 2017-02-28 DIAGNOSIS — E876 Hypokalemia: Secondary | ICD-10-CM

## 2017-02-28 DIAGNOSIS — K219 Gastro-esophageal reflux disease without esophagitis: Secondary | ICD-10-CM | POA: Diagnosis not present

## 2017-02-28 DIAGNOSIS — A09 Infectious gastroenteritis and colitis, unspecified: Secondary | ICD-10-CM

## 2017-02-28 DIAGNOSIS — R55 Syncope and collapse: Secondary | ICD-10-CM | POA: Diagnosis not present

## 2017-03-01 DIAGNOSIS — E876 Hypokalemia: Secondary | ICD-10-CM | POA: Diagnosis not present

## 2017-03-01 DIAGNOSIS — I951 Orthostatic hypotension: Secondary | ICD-10-CM | POA: Diagnosis not present

## 2017-03-01 DIAGNOSIS — R197 Diarrhea, unspecified: Secondary | ICD-10-CM | POA: Diagnosis not present

## 2017-03-01 DIAGNOSIS — A09 Infectious gastroenteritis and colitis, unspecified: Secondary | ICD-10-CM | POA: Diagnosis not present

## 2017-03-13 ENCOUNTER — Ambulatory Visit (INDEPENDENT_AMBULATORY_CARE_PROVIDER_SITE_OTHER): Payer: BC Managed Care – PPO | Admitting: *Deleted

## 2017-03-13 DIAGNOSIS — J309 Allergic rhinitis, unspecified: Secondary | ICD-10-CM

## 2017-03-14 ENCOUNTER — Other Ambulatory Visit: Payer: Self-pay | Admitting: Allergy and Immunology

## 2017-03-27 ENCOUNTER — Ambulatory Visit (INDEPENDENT_AMBULATORY_CARE_PROVIDER_SITE_OTHER): Payer: BC Managed Care – PPO | Admitting: *Deleted

## 2017-03-27 DIAGNOSIS — J309 Allergic rhinitis, unspecified: Secondary | ICD-10-CM

## 2017-04-10 ENCOUNTER — Ambulatory Visit (INDEPENDENT_AMBULATORY_CARE_PROVIDER_SITE_OTHER): Payer: BC Managed Care – PPO | Admitting: *Deleted

## 2017-04-10 DIAGNOSIS — J309 Allergic rhinitis, unspecified: Secondary | ICD-10-CM | POA: Diagnosis not present

## 2017-04-14 ENCOUNTER — Ambulatory Visit (INDEPENDENT_AMBULATORY_CARE_PROVIDER_SITE_OTHER): Payer: BC Managed Care – PPO | Admitting: Allergy and Immunology

## 2017-04-14 VITALS — BP 132/88 | HR 74 | Resp 18

## 2017-04-14 DIAGNOSIS — J3089 Other allergic rhinitis: Secondary | ICD-10-CM | POA: Diagnosis not present

## 2017-04-14 DIAGNOSIS — J4540 Moderate persistent asthma, uncomplicated: Secondary | ICD-10-CM

## 2017-04-14 DIAGNOSIS — K219 Gastro-esophageal reflux disease without esophagitis: Secondary | ICD-10-CM

## 2017-04-14 NOTE — Patient Instructions (Addendum)
  1. Continue Symbicort 160 2 inhalations twice a day   2. Continue to Consolidate all caffeine consumption aiming for none  3. Continue Qnasl 1- 2 puffs each nostril one time per day   4. Continue Nexium 40 g twice a day  5. Continue Ventolin HFA if needed  6. Continue nasal Azelastine, OTC antihistamine, nasal saline, if needed  7. Continue immunotherapy and EpiPen  8. Obtain fall flu vaccine    9. Return to clinic in 6 months or earlier if problem

## 2017-04-14 NOTE — Progress Notes (Signed)
Follow-up Note  Referring Provider: Marco Collie, MD Primary Provider: Marco Collie, MD Date of Office Visit: 04/14/2017  Subjective:   Laurie Simmons (DOB: 1966/06/04) is a 50 y.o. female who returns to the [facility] and [place] on 04/14/2017 in re-evaluation of the following:  HPI:   Laurie Simmons returns to this clinic in reevaluation of her asthma and allergic rhinoconjunctivitis and LPR. She was last seen in this clinic July 2018.  While consistently using anti-inflammatory medications and continuing on immunotherapy she has had a very good response to her current situation with asthma and allergic rhinitis. She has not required a systemic steroid or an antibiotic since I have seen her in this clinic and she rarely uses a short acting bronchodilator.  She could not tolerate ranitidine for it gave rise to stomach upset and she is now controlling her reflux with Nexium twice a day. Since she has completely eliminated all caffeine consumption she has had much better control of her reflux and her throat issue.  Allergies as of 04/14/2017      Reactions   Prednisone Hives, Other (See Comments)   Mood Swings.   Codeine Anxiety      Medication List      ARIPiprazole 10 MG tablet Commonly known as:  ABILIFY   Azelastine HCl 0.15 % Soln Can use one to two sprays in each nostril one to two times daily if needed.   budesonide-formoterol 160-4.5 MCG/ACT inhaler Commonly known as:  SYMBICORT Inhale two puffs twice daily to prevent cough or wheeze.  Rinse, gargle, and spit after use. Use with spacer.   clorazepate 3.75 MG tablet Commonly known as:  TRANXENE TAKE 1/2 TO 1 TABLET BID   esomeprazole 40 MG capsule Commonly known as:  NEXIUM take 1 capsule by mouth twice a day before meals   ferrous sulfate 325 (65 FE) MG EC tablet Take 325 mg by mouth 3 (three) times daily with meals.   QNASL 80 MCG/ACT Aers Generic drug:  Beclomethasone Dipropionate INSTILL 1 to 2 sprays into each  nostril once daily for STUFFY NOSE OR DRAINAGE   topiramate 25 MG tablet Commonly known as:  TOPAMAX 2 (two) times daily.   valsartan-hydrochlorothiazide 320-25 MG tablet Commonly known as:  DIOVAN-HCT Take 1 tablet by mouth daily.   VENTOLIN HFA 108 (90 Base) MCG/ACT inhaler Generic drug:  albuterol inhale 2 puffs INTO THE LUNGS every 4 hours if needed for wheezing or shortness of breath   VIIBRYD 40 MG Tabs Generic drug:  Vilazodone HCl daily.       Past Medical History:  Diagnosis Date  . Asthma   . GERD (gastroesophageal reflux disease)   . Severe anemia [id number]    Past Surgical History:  Procedure Laterality Date  . caesarean    . CHOLECYSTECTOMY    . HIP SURGERY Left     Review of systems negative except as noted in HPI / PMHx or noted below:  Review of Systems  Constitutional: Negative.   HENT: Negative.   Eyes: Negative.   Respiratory: Negative.   Cardiovascular: Negative.   Gastrointestinal: Negative.   Genitourinary: Negative.   Musculoskeletal: Negative.   Skin: Negative.   Neurological: Negative.   Endo/Heme/Allergies: Negative.   Psychiatric/Behavioral: Negative.      Objective:   Vitals:   04/14/17 1544  BP: 132/88  Pulse: 74  Resp: 18          Physical Exam  Constitutional: She is well-developed, well-nourished, and in  no distress.  HENT:  Head: Normocephalic.  Right Ear: Tympanic membrane, external ear and ear canal normal.  Left Ear: Tympanic membrane, external ear and ear canal normal.  Nose: Nose normal. No mucosal edema or rhinorrhea.  Mouth/Throat: Uvula is midline, oropharynx is clear and moist and mucous membranes are normal. No oropharyngeal exudate.  Eyes: Conjunctivae are normal.  Neck: Trachea normal. No tracheal tenderness present. No tracheal deviation present. No thyromegaly present.  Cardiovascular: Normal rate, regular rhythm, S1 normal, S2 normal and normal heart sounds.   No murmur heard. Pulmonary/Chest:  Breath sounds normal. No stridor. No respiratory distress. She has no wheezes. She has no rales.  Musculoskeletal: She exhibits no edema.  Lymphadenopathy:       Head (right side): No tonsillar adenopathy present.       Head (left side): No tonsillar adenopathy present.    She has no cervical adenopathy.  Neurological: She is alert. Gait normal.  Skin: No rash noted. She is not diaphoretic. No erythema. Nails show no clubbing.  Psychiatric: Mood and affect normal.    Diagnostics:    Spirometry was performed and demonstrated an FEV1 of 2.76 at 97 % of predicted.  The patient had an Asthma Control Test with the following results: ACT Total Score: 20.    Assessment and Plan:   1. Asthma, moderate persistent, well-controlled   2. Other allergic rhinitis   3. LPRD (laryngopharyngeal reflux disease)     1. Continue Symbicort 160 2 inhalations twice a day   2. Continue to Consolidate all caffeine consumption aiming for none  3. Continue Qnasl 1- 2 puffs each nostril one time per day   4. Continue Nexium 40 g twice a day  5. Continue Ventolin HFA if needed  6. Continue nasal Azelastine, OTC antihistamine, nasal saline, if needed  7. Continue immunotherapy and EpiPen  8. Obtain fall flu vaccine    9. Return to clinic in 6 months or earlier if problem  Laurie Simmons appears to be doing quite well on her current medical therapy which includes anti-inflammatory medications for her respiratory tract, immunotherapy, and therapy directed against reflux. I will assume she will continue to do well and see her back in this clinic in 6 months or earlier if there is a problem.  Laurie Katz, MD Allergy / Immunology [place]

## 2017-04-15 ENCOUNTER — Encounter: Payer: Self-pay | Admitting: Allergy and Immunology

## 2017-04-18 ENCOUNTER — Other Ambulatory Visit: Payer: Self-pay | Admitting: Allergy and Immunology

## 2017-04-25 ENCOUNTER — Ambulatory Visit (INDEPENDENT_AMBULATORY_CARE_PROVIDER_SITE_OTHER): Payer: BC Managed Care – PPO | Admitting: *Deleted

## 2017-04-25 DIAGNOSIS — J309 Allergic rhinitis, unspecified: Secondary | ICD-10-CM

## 2017-05-08 ENCOUNTER — Ambulatory Visit (INDEPENDENT_AMBULATORY_CARE_PROVIDER_SITE_OTHER): Payer: BC Managed Care – PPO | Admitting: *Deleted

## 2017-05-08 DIAGNOSIS — J309 Allergic rhinitis, unspecified: Secondary | ICD-10-CM | POA: Diagnosis not present

## 2017-05-22 ENCOUNTER — Encounter: Payer: Self-pay | Admitting: Allergy and Immunology

## 2017-05-22 ENCOUNTER — Ambulatory Visit (INDEPENDENT_AMBULATORY_CARE_PROVIDER_SITE_OTHER): Payer: BC Managed Care – PPO | Admitting: Allergy and Immunology

## 2017-05-22 VITALS — BP 140/96 | HR 100 | Temp 98.8°F

## 2017-05-22 DIAGNOSIS — J3089 Other allergic rhinitis: Secondary | ICD-10-CM

## 2017-05-22 DIAGNOSIS — J4541 Moderate persistent asthma with (acute) exacerbation: Secondary | ICD-10-CM | POA: Diagnosis not present

## 2017-05-22 DIAGNOSIS — K219 Gastro-esophageal reflux disease without esophagitis: Secondary | ICD-10-CM

## 2017-05-22 MED ORDER — METHYLPREDNISOLONE ACETATE 80 MG/ML IJ SUSP
40.0000 mg | Freq: Once | INTRAMUSCULAR | Status: AC
  Administered 2017-05-22: 40 mg via INTRAMUSCULAR

## 2017-05-22 MED ORDER — METHYLPREDNISOLONE ACETATE 40 MG/ML IJ SUSP: 40.0000 mg | Freq: Once | INTRAMUSCULAR | Status: DC

## 2017-05-22 NOTE — Patient Instructions (Addendum)
  1. Continue Symbicort 160 2 inhalations twice a day   2. Continue to Consolidate all caffeine consumption aiming for none  3. Continue Qnasl 1- 2 puffs each nostril one time per day   4. Continue Nexium 40 g twice a day  5. Continue Ventolin HFA if needed  6. Continue nasal Azelastine, OTC antihistamine, nasal saline, if needed  7. Continue immunotherapy and EpiPen  8. For this episode:   A. Add sample Qvar 40 Redihaler 2 puffs 2 times per day  B. Depomedrol 40 IM delivered in clinic today  9. Return to clinic in 6 months or earlier if problem

## 2017-05-22 NOTE — Progress Notes (Signed)
Follow-up Note  Referring Provider: Marco Collie, MD   Primary Provider: Marco Collie, MD Date of Office Visit: 05/22/2017  Subjective:   Laurie Simmons (DOB: 30-Jun-1966) is a 51 y.o. female who returns to the [facility] and [place] on 05/22/2017 in re-evaluation of the following:  HPI: Laurie Simmons presents to this clinic in evaluation of a problem that developed over the course of the past several days.  Her last visit to this clinic was 14 April 2017 at which point in time her asthma and allergic rhinoconjunctivitis and LPR were doing very well.  2-3 days ago she developed problems with shortness of breath and she has been using her bronchodilator about twice a day.  She does respond to a short acting bronchodilator for a short period in time.  There is no obvious provoking factor giving rise to this asthma activity.  She has not had a significant environmental change, has not had a fever, and has no associated nasal symptoms.  Her daughter is at home with a cough.  She did develop an issue with vomiting this morning.  She had 4 vomiting episodes with unknown trigger and without any other associated GI symptoms.  Allergies as of 05/22/2017      Reactions   Prednisone Hives, Other (See Comments)   Mood Swings.   Codeine Anxiety      Medication List      ARIPiprazole 10 MG tablet Commonly known as:  ABILIFY   Azelastine HCl 0.15 % Soln Can use one to two sprays in each nostril one to two times daily if needed.   budesonide-formoterol 160-4.5 MCG/ACT inhaler Commonly known as:  SYMBICORT Inhale two puffs twice daily to prevent cough or wheeze.  Rinse, gargle, and spit after use. Use with spacer.   clorazepate 3.75 MG tablet Commonly known as:  TRANXENE TAKE 1/2 TO 1 TABLET BID   esomeprazole 40 MG capsule Commonly known as:  NEXIUM Take one capsule twice daily as directed   ferrous sulfate 325 (65 FE) MG EC tablet Take 325 mg by mouth 3 (three) times daily with meals.     QNASL 80 MCG/ACT Aers Generic drug:  Beclomethasone Dipropionate INSTILL 1 to 2 sprays into each nostril once daily for STUFFY NOSE OR DRAINAGE   topiramate 25 MG tablet Commonly known as:  TOPAMAX 2 (two) times daily.   valsartan-hydrochlorothiazide 320-25 MG tablet Commonly known as:  DIOVAN-HCT Take 1 tablet by mouth daily.   VENTOLIN HFA 108 (90 Base) MCG/ACT inhaler Generic drug:  albuterol inhale 2 puffs INTO THE LUNGS every 4 hours if needed for wheezing or shortness of breath   VIIBRYD 40 MG Tabs Generic drug:  Vilazodone HCl daily.       Past Medical History:  Diagnosis Date  . Asthma   . GERD (gastroesophageal reflux disease)   . Severe anemia [id number]    Past Surgical History:  Procedure Laterality Date  . caesarean    . CHOLECYSTECTOMY    . HIP SURGERY Left     Review of systems negative except as noted in HPI / PMHx or noted below:  Review of Systems  Constitutional: Negative.   HENT: Negative.   Eyes: Negative.   Respiratory: Negative.   Cardiovascular: Negative.   Gastrointestinal: Negative.   Genitourinary: Negative.   Musculoskeletal: Negative.   Skin: Negative.   Neurological: Negative.   Endo/Heme/Allergies: Negative.   Psychiatric/Behavioral: Negative.      Objective:   Vitals:   05/22/17 1531  BP: (!) 140/96  Pulse: 100  Temp: 98.8 F (37.1 C)          Physical Exam  Constitutional: She is well-developed, well-nourished, and in no distress.  HENT:  Head: Normocephalic.  Right Ear: Tympanic membrane, external ear and ear canal normal.  Left Ear: Tympanic membrane, external ear and ear canal normal.  Nose: Nose normal. No mucosal edema or rhinorrhea.  Mouth/Throat: Uvula is midline, oropharynx is clear and moist and mucous membranes are normal. No oropharyngeal exudate.  Eyes: Conjunctivae are normal.  Neck: Trachea normal. No tracheal tenderness present. No tracheal deviation present. No thyromegaly present.   Cardiovascular: Normal rate, regular rhythm, S1 normal, S2 normal and normal heart sounds.  No murmur heard. Pulmonary/Chest: Breath sounds normal. No stridor. No respiratory distress. She has no wheezes. She has no rales.  Musculoskeletal: She exhibits no edema.  Lymphadenopathy:       Head (right side): No tonsillar adenopathy present.       Head (left side): No tonsillar adenopathy present.    She has no cervical adenopathy.  Neurological: She is alert. Gait normal.  Skin: No rash noted. She is not diaphoretic. No erythema. Nails show no clubbing.  Psychiatric: Mood and affect normal.    Diagnostics:    Spirometry was performed and demonstrated an FEV1 of 2.44 at 86 % of predicted.  The patient had an Asthma Control Test with the following results:  .    Assessment and Plan:   1. Asthma, not well controlled, moderate persistent, with acute exacerbation   2. Other allergic rhinitis   3. LPRD (laryngopharyngeal reflux disease)     1. Continue Symbicort 160 2 inhalations twice a day   2. Continue to Consolidate all caffeine consumption aiming for none  3. Continue Qnasl 1- 2 puffs each nostril one time per day   4. Continue Nexium 40 g twice a day  5. Continue Ventolin HFA if needed  6. Continue nasal Azelastine, OTC antihistamine, nasal saline, if needed  7. Continue immunotherapy and EpiPen  8. For this episode:   A. Add sample Qvar 40 Redihaler 2 puffs 2 times per day  B. Depomedrol 40 IM delivered in clinic today  9. Return to clinic in 6 months or earlier if problem  Pooja appears to have some inflammation of her lower airway without an obvious provoking factor and we will treat her with the anti-inflammatory medications noted above.  She will keep in contact with me noting a response.  Further evaluation and treatment will be based upon her response to this plan  Allena Katz, MD Allergy / [place]

## 2017-05-26 ENCOUNTER — Encounter: Payer: Self-pay | Admitting: Allergy and Immunology

## 2017-05-30 ENCOUNTER — Ambulatory Visit (INDEPENDENT_AMBULATORY_CARE_PROVIDER_SITE_OTHER): Payer: BC Managed Care – PPO | Admitting: *Deleted

## 2017-05-30 DIAGNOSIS — J309 Allergic rhinitis, unspecified: Secondary | ICD-10-CM

## 2017-06-04 ENCOUNTER — Encounter: Payer: Self-pay | Admitting: *Deleted

## 2017-06-04 DIAGNOSIS — J301 Allergic rhinitis due to pollen: Secondary | ICD-10-CM | POA: Diagnosis not present

## 2017-06-04 NOTE — Progress Notes (Signed)
MAINTENANCE VIAL MADE. EXP: 06-04-18. HC

## 2017-06-13 ENCOUNTER — Ambulatory Visit (INDEPENDENT_AMBULATORY_CARE_PROVIDER_SITE_OTHER): Payer: BC Managed Care – PPO | Admitting: *Deleted

## 2017-06-13 DIAGNOSIS — J309 Allergic rhinitis, unspecified: Secondary | ICD-10-CM

## 2017-07-10 ENCOUNTER — Ambulatory Visit (INDEPENDENT_AMBULATORY_CARE_PROVIDER_SITE_OTHER): Payer: BC Managed Care – PPO | Admitting: *Deleted

## 2017-07-10 DIAGNOSIS — J309 Allergic rhinitis, unspecified: Secondary | ICD-10-CM | POA: Diagnosis not present

## 2017-07-31 ENCOUNTER — Ambulatory Visit (INDEPENDENT_AMBULATORY_CARE_PROVIDER_SITE_OTHER): Payer: BC Managed Care – PPO

## 2017-07-31 DIAGNOSIS — J309 Allergic rhinitis, unspecified: Secondary | ICD-10-CM

## 2017-08-21 ENCOUNTER — Ambulatory Visit (INDEPENDENT_AMBULATORY_CARE_PROVIDER_SITE_OTHER): Payer: BC Managed Care – PPO

## 2017-08-21 DIAGNOSIS — J309 Allergic rhinitis, unspecified: Secondary | ICD-10-CM | POA: Diagnosis not present

## 2017-09-01 ENCOUNTER — Ambulatory Visit: Payer: BC Managed Care – PPO | Admitting: Allergy and Immunology

## 2017-09-08 ENCOUNTER — Ambulatory Visit (INDEPENDENT_AMBULATORY_CARE_PROVIDER_SITE_OTHER): Payer: BC Managed Care – PPO | Admitting: *Deleted

## 2017-09-08 DIAGNOSIS — J309 Allergic rhinitis, unspecified: Secondary | ICD-10-CM

## 2017-10-09 ENCOUNTER — Ambulatory Visit (INDEPENDENT_AMBULATORY_CARE_PROVIDER_SITE_OTHER): Payer: BC Managed Care – PPO | Admitting: *Deleted

## 2017-10-09 DIAGNOSIS — J309 Allergic rhinitis, unspecified: Secondary | ICD-10-CM | POA: Diagnosis not present

## 2017-10-20 ENCOUNTER — Ambulatory Visit (INDEPENDENT_AMBULATORY_CARE_PROVIDER_SITE_OTHER): Payer: BC Managed Care – PPO | Admitting: *Deleted

## 2017-10-20 DIAGNOSIS — J309 Allergic rhinitis, unspecified: Secondary | ICD-10-CM

## 2017-11-06 ENCOUNTER — Ambulatory Visit (INDEPENDENT_AMBULATORY_CARE_PROVIDER_SITE_OTHER): Payer: BC Managed Care – PPO | Admitting: *Deleted

## 2017-11-06 DIAGNOSIS — J309 Allergic rhinitis, unspecified: Secondary | ICD-10-CM

## 2017-11-20 ENCOUNTER — Ambulatory Visit (INDEPENDENT_AMBULATORY_CARE_PROVIDER_SITE_OTHER): Payer: BC Managed Care – PPO | Admitting: *Deleted

## 2017-11-20 DIAGNOSIS — J309 Allergic rhinitis, unspecified: Secondary | ICD-10-CM | POA: Diagnosis not present

## 2017-12-04 ENCOUNTER — Ambulatory Visit (INDEPENDENT_AMBULATORY_CARE_PROVIDER_SITE_OTHER): Payer: BC Managed Care – PPO | Admitting: *Deleted

## 2017-12-04 DIAGNOSIS — J309 Allergic rhinitis, unspecified: Secondary | ICD-10-CM

## 2017-12-22 ENCOUNTER — Encounter: Payer: Self-pay | Admitting: Allergy and Immunology

## 2017-12-22 ENCOUNTER — Ambulatory Visit (INDEPENDENT_AMBULATORY_CARE_PROVIDER_SITE_OTHER): Payer: BC Managed Care – PPO | Admitting: Allergy and Immunology

## 2017-12-22 VITALS — BP 132/68 | HR 72 | Resp 20

## 2017-12-22 DIAGNOSIS — K219 Gastro-esophageal reflux disease without esophagitis: Secondary | ICD-10-CM

## 2017-12-22 DIAGNOSIS — J3089 Other allergic rhinitis: Secondary | ICD-10-CM

## 2017-12-22 DIAGNOSIS — J4540 Moderate persistent asthma, uncomplicated: Secondary | ICD-10-CM | POA: Diagnosis not present

## 2017-12-22 DIAGNOSIS — J309 Allergic rhinitis, unspecified: Secondary | ICD-10-CM

## 2017-12-22 NOTE — Progress Notes (Signed)
Follow-up Note  Referring Provider: Marco Collie, MD Primary Provider: Marco Collie, MD Date of Office Visit: 12/22/2017  Subjective:   Laurie Simmons (DOB: Jan 15, 1966) is a 52 y.o. female who returns to the [facility] [place] on 12/22/2017 in re-evaluation of the following:  HPI: Hayzel returns to this clinic in reevaluation of her asthma and allergic rhinitis and LPR.  I last saw her in this clinic 22 May 2017.  She has really had a very good winter and spring.  She has not required a systemic steroid or an antibiotic to treat any type of respiratory tract issue.  Rarely does she use a short acting bronchodilator.  For the most part she uses Symbicort just 1 time per day and no longer uses any Qnasl.  Her reflux has been under excellent control at this point in time while using Nexium just 1 time per day.  Her immunotherapy is going quite well currently at every 3 weeks without any adverse effects.  Tierrah has retired from [employer] for 26 years as of the conclusion of the school year.  Allergies as of 12/22/2017      Reactions   Prednisone Hives, Other (See Comments)   Mood Swings.   Codeine Anxiety      Medication List      Azelastine HCl 0.15 % Soln Can use one to two sprays in each nostril one to two times daily if needed.   budesonide-formoterol 160-4.5 MCG/ACT inhaler Commonly known as:  SYMBICORT Inhale two puffs twice daily to prevent cough or wheeze.  Rinse, gargle, and spit after use. Use with spacer.   clorazepate 3.75 MG tablet Commonly known as:  TRANXENE TAKE 1/2 TO 1 TABLET BID   esomeprazole 40 MG capsule Commonly known as:  NEXIUM Take one capsule twice daily as directed   ferrous sulfate 325 (65 FE) MG EC tablet Take 325 mg by mouth 3 (three) times daily with meals.   LO LOESTRIN FE 1 MG-10 MCG / 10 MCG tablet Generic drug:  Norethindrone-Ethinyl Estradiol-Fe Biphas Take 1 tablet daily by mouth.   QNASL 80 MCG/ACT Aers Generic  drug:  Beclomethasone Dipropionate INSTILL 1 to 2 sprays into each nostril once daily for STUFFY NOSE OR DRAINAGE   REXULTI 2 MG Tabs Generic drug:  Brexpiprazole Take 1 tablet by mouth daily.   topiramate 25 MG tablet Commonly known as:  TOPAMAX 2 (two) times daily.   valsartan-hydrochlorothiazide 320-25 MG tablet Commonly known as:  DIOVAN-HCT Take 1 tablet by mouth daily.   VENTOLIN HFA 108 (90 Base) MCG/ACT inhaler Generic drug:  albuterol inhale 2 puffs INTO THE LUNGS every 4 hours if needed for wheezing or shortness of breath   VIIBRYD 40 MG Tabs Generic drug:  Vilazodone HCl daily.       Past Medical History:  Diagnosis Date  . Asthma   . GERD (gastroesophageal reflux disease)   . Severe anemia [id number]    Past Surgical History:  Procedure Laterality Date  . caesarean    . CHOLECYSTECTOMY    . HIP SURGERY Left     Review of systems negative except as noted in HPI / PMHx or noted below:  Review of Systems  Constitutional: Negative.   HENT: Negative.   Eyes: Negative.   Respiratory: Negative.   Cardiovascular: Negative.   Gastrointestinal: Negative.   Genitourinary: Negative.   Musculoskeletal: Negative.   Skin: Negative.   Neurological: Negative.   Endo/Heme/Allergies: Negative.   Psychiatric/Behavioral: Negative.  Objective:   Vitals:   12/22/17 1617  BP: 132/68  Pulse: 72  Resp: 20          Physical Exam  HENT:  Head: Normocephalic.  Right Ear: Tympanic membrane, external ear and ear canal normal.  Left Ear: Tympanic membrane, external ear and ear canal normal.  Nose: Nose normal. No mucosal edema or rhinorrhea.  Mouth/Throat: Uvula is midline, oropharynx is clear and moist and mucous membranes are normal. No oropharyngeal exudate.  Eyes: Conjunctivae are normal.  Neck: Trachea normal. No tracheal tenderness present. No tracheal deviation present. No thyromegaly present.  Cardiovascular: Normal rate, regular rhythm, S1 normal, S2  normal and normal heart sounds.  No murmur heard. Pulmonary/Chest: Breath sounds normal. No stridor. No respiratory distress. She has no wheezes. She has no rales.  Musculoskeletal: She exhibits no edema.  Lymphadenopathy:       Head (right side): No tonsillar adenopathy present.       Head (left side): No tonsillar adenopathy present.    She has no cervical adenopathy.  Neurological: She is alert.  Skin: No rash noted. She is not diaphoretic. No erythema. Nails show no clubbing.    Diagnostics:    Spirometry was performed and demonstrated an FEV1 of 2.56 at 91 % of predicted.  The patient had an Asthma Control Test with the following results: ACT Total Score: 21.    Assessment and Plan:   1. Asthma, moderate persistent, well-controlled   2. Other allergic rhinitis   3. LPRD (laryngopharyngeal reflux disease)     1. Continue Symbicort 160 2 inhalations 1-2 times per day depending on disease activity  2. Continue to Consolidate all caffeine consumption aiming for none  3. Continue Qnasl 1- 2 puffs each nostril one time per day during periods of upper airway symptoms  4. Continue Nexium 40 mg 1-2 tablets 1 time per day depending on disease activity  5. Continue Ventolin HFA if needed  6. Continue nasal Azelastine, OTC antihistamine, nasal saline, if needed  7. Continue immunotherapy and EpiPen  8. Return to clinic in 6 months or earlier if problem  9. Obtain fall flu vaccine  Dahiana appears to be doing quite well at this point in time and she will continue to utilize relatively low doses of anti-inflammatory agents for her respiratory tract while also continuing to utilize a course of immunotherapy.  Obviously immunotherapy is given rise to significant improvement regarding her atopic disease especially during the spring.  I will see her back in this clinic in 6 months or earlier if there is a problem.  Allena Katz, MD Allergy / Immunology [place]

## 2017-12-22 NOTE — Patient Instructions (Addendum)
  1. Continue Symbicort 160 2 inhalations 1-2 times per day depending on disease activity  2. Continue to Consolidate all caffeine consumption aiming for none  3. Continue Qnasl 1- 2 puffs each nostril one time per day during periods of upper airway symptoms  4. Continue Nexium 40 mg 1-2 tablets 1 time per day depending on disease activity  5. Continue Ventolin HFA if needed  6. Continue nasal Azelastine, OTC antihistamine, nasal saline, if needed  7. Continue immunotherapy and EpiPen  8. Return to clinic in 6 months or earlier if problem  9. Obtain fall flu vaccine

## 2017-12-23 ENCOUNTER — Encounter: Payer: Self-pay | Admitting: Allergy and Immunology

## 2017-12-26 DIAGNOSIS — J301 Allergic rhinitis due to pollen: Secondary | ICD-10-CM | POA: Diagnosis not present

## 2018-01-12 ENCOUNTER — Ambulatory Visit (INDEPENDENT_AMBULATORY_CARE_PROVIDER_SITE_OTHER): Payer: BC Managed Care – PPO | Admitting: *Deleted

## 2018-01-12 DIAGNOSIS — J309 Allergic rhinitis, unspecified: Secondary | ICD-10-CM | POA: Diagnosis not present

## 2018-02-02 ENCOUNTER — Ambulatory Visit (INDEPENDENT_AMBULATORY_CARE_PROVIDER_SITE_OTHER): Payer: BC Managed Care – PPO

## 2018-02-02 DIAGNOSIS — J309 Allergic rhinitis, unspecified: Secondary | ICD-10-CM

## 2018-02-06 ENCOUNTER — Other Ambulatory Visit: Payer: Self-pay | Admitting: Allergy and Immunology

## 2018-02-27 ENCOUNTER — Ambulatory Visit (INDEPENDENT_AMBULATORY_CARE_PROVIDER_SITE_OTHER): Payer: BC Managed Care – PPO | Admitting: *Deleted

## 2018-02-27 DIAGNOSIS — J309 Allergic rhinitis, unspecified: Secondary | ICD-10-CM

## 2018-03-12 ENCOUNTER — Other Ambulatory Visit: Payer: Self-pay | Admitting: Allergy and Immunology

## 2018-03-26 ENCOUNTER — Ambulatory Visit (INDEPENDENT_AMBULATORY_CARE_PROVIDER_SITE_OTHER): Payer: BC Managed Care – PPO | Admitting: *Deleted

## 2018-03-26 DIAGNOSIS — J309 Allergic rhinitis, unspecified: Secondary | ICD-10-CM | POA: Diagnosis not present

## 2018-04-10 ENCOUNTER — Other Ambulatory Visit: Payer: Self-pay | Admitting: Allergy and Immunology

## 2018-04-13 ENCOUNTER — Ambulatory Visit (INDEPENDENT_AMBULATORY_CARE_PROVIDER_SITE_OTHER): Payer: BC Managed Care – PPO | Admitting: *Deleted

## 2018-04-13 DIAGNOSIS — J309 Allergic rhinitis, unspecified: Secondary | ICD-10-CM | POA: Diagnosis not present

## 2018-05-01 ENCOUNTER — Ambulatory Visit (INDEPENDENT_AMBULATORY_CARE_PROVIDER_SITE_OTHER): Payer: BC Managed Care – PPO | Admitting: *Deleted

## 2018-05-01 DIAGNOSIS — J309 Allergic rhinitis, unspecified: Secondary | ICD-10-CM | POA: Diagnosis not present

## 2018-05-14 ENCOUNTER — Ambulatory Visit (INDEPENDENT_AMBULATORY_CARE_PROVIDER_SITE_OTHER): Payer: BC Managed Care – PPO | Admitting: *Deleted

## 2018-05-14 DIAGNOSIS — J309 Allergic rhinitis, unspecified: Secondary | ICD-10-CM | POA: Diagnosis not present

## 2018-05-28 ENCOUNTER — Ambulatory Visit (INDEPENDENT_AMBULATORY_CARE_PROVIDER_SITE_OTHER): Payer: BC Managed Care – PPO | Admitting: *Deleted

## 2018-05-28 DIAGNOSIS — J309 Allergic rhinitis, unspecified: Secondary | ICD-10-CM

## 2018-05-31 ENCOUNTER — Other Ambulatory Visit: Payer: Self-pay | Admitting: Allergy and Immunology

## 2018-06-08 ENCOUNTER — Ambulatory Visit (INDEPENDENT_AMBULATORY_CARE_PROVIDER_SITE_OTHER): Payer: BC Managed Care – PPO | Admitting: *Deleted

## 2018-06-08 DIAGNOSIS — J309 Allergic rhinitis, unspecified: Secondary | ICD-10-CM

## 2018-06-22 ENCOUNTER — Encounter: Payer: Self-pay | Admitting: Allergy and Immunology

## 2018-06-22 ENCOUNTER — Ambulatory Visit (INDEPENDENT_AMBULATORY_CARE_PROVIDER_SITE_OTHER): Payer: BC Managed Care – PPO | Admitting: Allergy and Immunology

## 2018-06-22 VITALS — BP 158/100 | HR 74 | Resp 16

## 2018-06-22 DIAGNOSIS — J4540 Moderate persistent asthma, uncomplicated: Secondary | ICD-10-CM | POA: Diagnosis not present

## 2018-06-22 DIAGNOSIS — K219 Gastro-esophageal reflux disease without esophagitis: Secondary | ICD-10-CM | POA: Diagnosis not present

## 2018-06-22 DIAGNOSIS — J3089 Other allergic rhinitis: Secondary | ICD-10-CM

## 2018-06-22 MED ORDER — AZELASTINE HCL 0.15 % NA SOLN: NASAL | 1 refills | Status: DC

## 2018-06-22 NOTE — Progress Notes (Signed)
Follow-up Note  Referring Provider: Marco Collie, MD Primary Provider: Marco Collie, MD Date of Office Visit: 06/22/2018  Subjective:   Laurie Simmons (DOB: [birth year]) is a 52 y.o. female who returns to the [facility] [place] on 06/22/2018 in re-evaluation of the following:  HPI: [name] presents to this clinic in reevaluation of her asthma and allergic rhinoconjunctivitis and reflux.  I last saw her in this clinic on 22 December 2017.  Overall she has done relatively well.  She rarely has any problems with her asthma.  She goes long intervals of time without the use of any Symbicort.  She has not required an antibiotic or systemic steroid to treat an exacerbation of her airway disease.  Rarely does use a short acting bronchodilator.  Her nose has been doing relatively well but about 2 months ago she developed a febrile illness with a temperature of 100.7 along with respiratory tract symptoms.  She has resolved the fever but she is still left with some nasal congestion more so on the right than the left side.  There is no associated anosmia or ugly nasal discharge.  She does have a low-grade headache.  She was not using any nasal steroid at the time that this developed.  She just started Nasacort on a daily basis a few weeks ago.  Reflux has been under excellent control mostly using Nexium just 1 time per day but occasionally twice a day.  Dashawn is undergoing a course of immunotherapy currently at every 3 weeks without any adverse effect.  She did receive the flu vaccine this year.  Allergies as of 06/22/2018      Reactions   Prednisone Hives, Other (See Comments)   Mood Swings.   Codeine Anxiety      Medication List      albuterol 108 (90 Base) MCG/ACT inhaler Commonly known as:  PROVENTIL HFA;VENTOLIN HFA INHALE 2 PUFFS INTO THE LUNGS EVERY 4 HOURS IF NEEDED FOR WHEEZING OR SHORTNESS OF BREATH   Azelastine HCl 0.15 % Soln Can use one to two sprays in each nostril one to  two times daily if needed.   budesonide-formoterol 160-4.5 MCG/ACT inhaler Commonly known as:  SYMBICORT Inhale two puffs twice daily to prevent cough or wheeze.  Rinse, gargle, and spit after use. Use with spacer.   clorazepate 3.75 MG tablet Commonly known as:  TRANXENE TAKE 1/2 TO 1 TABLET BID   esomeprazole 40 MG capsule Commonly known as:  NEXIUM TAKE 1 CAPSULE BY MOUTH TWICE A DAY AS DIRECTED   LO LOESTRIN FE 1 MG-10 MCG / 10 MCG tablet Generic drug:  Norethindrone-Ethinyl Estradiol-Fe Biphas Take 1 tablet daily by mouth.   nabumetone 750 MG tablet Commonly known as:  RELAFEN TK 1 T PO BID   QNASL 80 MCG/ACT Aers Generic drug:  Beclomethasone Dipropionate USE 1 TO 2 SPRAYS INTO EACH NOSTRIL EVERY DAY FOR STUFFY NOSE OR DRAINAGE   rosuvastatin 5 MG tablet Commonly known as:  CRESTOR TK 1 T PO 2 TIMES WEEKLY   topiramate 25 MG tablet Commonly known as:  TOPAMAX 2 (two) times daily.   valsartan-hydrochlorothiazide 320-25 MG tablet Commonly known as:  DIOVAN-HCT Take 1 tablet by mouth daily.   VIIBRYD 40 MG Tabs Generic drug:  Vilazodone HCl daily.       Past Medical History:  Diagnosis Date  . Asthma   . Carpal tunnel syndrome   . Diabetes ([place])   . GERD (gastroesophageal reflux disease)   .  Severe anemia [id number]    Past Surgical History:  Procedure Laterality Date  . caesarean    . CHOLECYSTECTOMY    . HIP SURGERY Left     Review of systems negative except as noted in HPI / PMHx or noted below:  Review of Systems  Constitutional: Negative.   HENT: Negative.   Eyes: Negative.   Respiratory: Negative.   Cardiovascular: Negative.   Gastrointestinal: Negative.   Genitourinary: Negative.   Musculoskeletal: Negative.   Skin: Negative.   Neurological: Negative.   Endo/Heme/Allergies: Negative.   Psychiatric/Behavioral: Negative.      Objective:   Vitals:   06/22/18 1053  BP: (!) 158/100  Pulse: 74  Resp: 16          Physical Exam    HENT:  Head: Normocephalic.  Right Ear: Tympanic membrane, external ear and ear canal normal.  Left Ear: Tympanic membrane, external ear and ear canal normal.  Nose: Nose normal. No mucosal edema or rhinorrhea.  Mouth/Throat: Uvula is midline, oropharynx is clear and moist and mucous membranes are normal. No oropharyngeal exudate.  Eyes: Conjunctivae are normal.  Neck: Trachea normal. No tracheal tenderness present. No tracheal deviation present. No thyromegaly present.  Cardiovascular: Normal rate, regular rhythm, S1 normal, S2 normal and normal heart sounds.  No murmur heard. Pulmonary/Chest: Breath sounds normal. No stridor. No respiratory distress. She has no wheezes. She has no rales.  Musculoskeletal: She exhibits no edema.  Lymphadenopathy:       Head (right side): No tonsillar adenopathy present.       Head (left side): No tonsillar adenopathy present.    She has no cervical adenopathy.  Neurological: She is alert.  Skin: No rash noted. She is not diaphoretic. No erythema. Nails show no clubbing.    Diagnostics:    Spirometry was performed and demonstrated an FEV1 of 2.73 at 97 % of predicted.  Assessment and Plan:   1. Asthma, moderate persistent, well-controlled   2. Other allergic rhinitis   3. LPRD (laryngopharyngeal reflux disease)     1. Continue Symbicort 160 2 inhalations 1-2 times per day depending on disease activity  2.  Use a combination of OTC Nasacort 1 spray each nostril 2 times a day plus Azelastine 1 spray each nostril 2 times a day during periods of upper airway symptoms  3. Continue Nexium 40 mg 1-2 tablets 1 time per day depending on disease activity  4. Continue Ventolin HFA if needed  5. Continue OTC antihistamine, nasal saline, if needed  6. Continue immunotherapy and EpiPen  7. Further evaluation and treatment?  8. Return to clinic in 6 months or earlier if problem  Overall [name] appears to be doing quite well other than the fact that  she does have some persistent congestion affecting her upper airway which hopefully is going to respond to a combination of Nasacort and nasal antihistamine utilized on a consistent basis.  She will keep in contact with me noting her response to this approach over the course of the next 2 weeks.  She will also remain on a collection of medications directed against respiratory tract inflammation and reflux as noted above and she will continue on immunotherapy.  I will see her back in this clinic in 6 months or earlier if there is a problem.  Allena Katz, MD Allergy / Immunology [place]

## 2018-06-22 NOTE — Patient Instructions (Addendum)
  1. Continue Symbicort 160 2 inhalations 1-2 times per day depending on disease activity  2.  Use a combination of OTC Nasacort 1 spray each nostril 2 times a day plus Azelastine 1 spray each nostril 2 times a day during periods of upper airway symptoms  3. Continue Nexium 40 mg 1-2 tablets 1 time per day depending on disease activity  4. Continue Ventolin HFA if needed  5. Continue OTC antihistamine, nasal saline, if needed  6. Continue immunotherapy and EpiPen  7. Further evaluation and treatment?  8. Return to clinic in 6 months or earlier if problem

## 2018-06-23 ENCOUNTER — Encounter: Payer: Self-pay | Admitting: Allergy and Immunology

## 2018-06-29 ENCOUNTER — Ambulatory Visit (INDEPENDENT_AMBULATORY_CARE_PROVIDER_SITE_OTHER): Payer: BC Managed Care – PPO | Admitting: *Deleted

## 2018-06-29 DIAGNOSIS — J309 Allergic rhinitis, unspecified: Secondary | ICD-10-CM | POA: Diagnosis not present

## 2018-07-14 NOTE — Progress Notes (Signed)
VIALS EXP 07-15-19

## 2018-07-16 DIAGNOSIS — J301 Allergic rhinitis due to pollen: Secondary | ICD-10-CM | POA: Diagnosis not present

## 2018-07-16 DIAGNOSIS — M79642 Pain in left hand: Secondary | ICD-10-CM | POA: Insufficient documentation

## 2018-07-16 DIAGNOSIS — M79641 Pain in right hand: Secondary | ICD-10-CM

## 2018-07-16 HISTORY — DX: Pain in right hand: M79.641

## 2018-07-20 ENCOUNTER — Ambulatory Visit (INDEPENDENT_AMBULATORY_CARE_PROVIDER_SITE_OTHER): Payer: BC Managed Care – PPO | Admitting: *Deleted

## 2018-07-20 DIAGNOSIS — J309 Allergic rhinitis, unspecified: Secondary | ICD-10-CM | POA: Diagnosis not present

## 2018-07-30 ENCOUNTER — Ambulatory Visit (INDEPENDENT_AMBULATORY_CARE_PROVIDER_SITE_OTHER): Payer: BC Managed Care – PPO | Admitting: Allergy and Immunology

## 2018-07-30 ENCOUNTER — Encounter: Payer: Self-pay | Admitting: Allergy and Immunology

## 2018-07-30 VITALS — BP 138/92 | HR 75 | Temp 99.4°F | Resp 18 | Ht 65.3 in | Wt 254.0 lb

## 2018-07-30 DIAGNOSIS — J3089 Other allergic rhinitis: Secondary | ICD-10-CM | POA: Diagnosis not present

## 2018-07-30 DIAGNOSIS — T39395A Adverse effect of other nonsteroidal anti-inflammatory drugs [NSAID], initial encounter: Secondary | ICD-10-CM

## 2018-07-30 DIAGNOSIS — J4540 Moderate persistent asthma, uncomplicated: Secondary | ICD-10-CM

## 2018-07-30 DIAGNOSIS — K2960 Other gastritis without bleeding: Secondary | ICD-10-CM

## 2018-07-30 DIAGNOSIS — K219 Gastro-esophageal reflux disease without esophagitis: Secondary | ICD-10-CM | POA: Diagnosis not present

## 2018-07-30 MED ORDER — MELOXICAM 15 MG PO TABS: ORAL_TABLET | ORAL | 5 refills | Status: DC

## 2018-07-30 NOTE — Progress Notes (Signed)
Follow-up Note  Referring Provider: Marco Collie, MD Primary Provider: Marco Collie, MD Date of Office Visit: 07/30/2018  Subjective:   Laurie Simmons (DOB: 1966/02/21) is a 53 y.o. female who returns to the [facility] [place] on 07/30/2018 in re-evaluation of the following:  HPI: Laurie Simmons returns to this clinic in reevaluation of her asthma and allergic rhinoconjunctivitis and reflux.  I last saw her in this clinic 22 June 2018 at which point in time she was utilizing a collection of medical therapy to address her issues including immunotherapy and the plan was to have her come back to see [place] in 6 months.  Unfortunately, Christmas Day she contracted flu and was treated with Tamiflu.  This Tuesday morning, 48 hours ago, she developed acute onset fatigue with a temperature of 100 and nasal congestion and a headache without any other associated systemic or constitutional symptoms.  She is actually a little bit better today.  She does not have any ugly nasal discharge or ugly sputum production or chest pain or significant cough or shortness of breath.  Laurie Simmons informs me that she has been using Tylenol on almost a daily basis for these low-grade headache that have been present for a prolonged period in time and occasionally some joint issues.  She has the diagnosis of nonsteroidal anti-inflammatory drug induced gastritis and she is very careful about using ibuprofen.  She uses ibuprofen around the time of her menstrual period induced uterine pain for short periods of time  Allergies as of 07/30/2018      Reactions   Prednisone Hives, Other (See Comments)   Mood Swings.   Codeine Anxiety      Medication List      albuterol 108 (90 Base) MCG/ACT inhaler Commonly known as:  PROVENTIL HFA;VENTOLIN HFA INHALE 2 PUFFS INTO THE LUNGS EVERY 4 HOURS IF NEEDED FOR WHEEZING OR SHORTNESS OF BREATH   Azelastine HCl 0.15 % Soln Can use one spray in each nostril two times daily if needed.     budesonide-formoterol 160-4.5 MCG/ACT inhaler Commonly known as:  SYMBICORT Inhale two puffs twice daily to prevent cough or wheeze.  Rinse, gargle, and spit after use. Use with spacer.   clorazepate 3.75 MG tablet Commonly known as:  TRANXENE TAKE 1/2 TO 1 TABLET BID   esomeprazole 40 MG capsule Commonly known as:  NEXIUM TAKE 1 CAPSULE BY MOUTH TWICE A DAY AS DIRECTED   LO LOESTRIN FE 1 MG-10 MCG / 10 MCG tablet Generic drug:  Norethindrone-Ethinyl Estradiol-Fe Biphas Take 1 tablet daily by mouth.   NASACORT ALLERGY 24HR 55 MCG/ACT Aero nasal inhaler Generic drug:  triamcinolone Place 2 sprays into the nose daily.   rosuvastatin 5 MG tablet Commonly known as:  CRESTOR TK 1 T PO 2 TIMES WEEKLY   topiramate 25 MG tablet Commonly known as:  TOPAMAX 2 (two) times daily.   valsartan-hydrochlorothiazide 320-25 MG tablet Commonly known as:  DIOVAN-HCT Take 1 tablet by mouth daily.   VIIBRYD 40 MG Tabs Generic drug:  Vilazodone HCl daily.       Past Medical History:  Diagnosis Date  . Asthma   . Carpal tunnel syndrome   . Diabetes ([place])   . GERD (gastroesophageal reflux disease)   . Severe anemia [id number]    Past Surgical History:  Procedure Laterality Date  . caesarean    . CHOLECYSTECTOMY    . HIP SURGERY Left     Review of systems negative except as noted in  HPI / PMHx or noted below:  Review of Systems  Constitutional: Negative.   HENT: Negative.   Eyes: Negative.   Respiratory: Negative.   Cardiovascular: Negative.   Gastrointestinal: Negative.   Genitourinary: Negative.   Musculoskeletal: Negative.   Skin: Negative.   Neurological: Negative.   Endo/Heme/Allergies: Negative.   Psychiatric/Behavioral: Negative.      Objective:   Vitals:   07/30/18 0830 07/30/18 0850  BP: (!) 160/88 (!) 138/92  Pulse: 75   Resp: 18   Temp: 99.4 F (37.4 C)    Height: 5' 5.3" (165.9 cm)  Weight: 254 lb (115.2 kg)   Physical Exam Constitutional:       Appearance: She is not diaphoretic.  HENT:     Head: Normocephalic.     Right Ear: Tympanic membrane, ear canal and external ear normal.     Left Ear: Tympanic membrane, ear canal and external ear normal.     Nose: Nose normal. No mucosal edema or rhinorrhea.     Mouth/Throat:     Pharynx: Uvula midline. No oropharyngeal exudate.  Eyes:     Conjunctiva/sclera: Conjunctivae normal.  Neck:     Thyroid: No thyromegaly.     Trachea: Trachea normal. No tracheal tenderness or tracheal deviation.  Cardiovascular:     Rate and Rhythm: Normal rate and regular rhythm.     Heart sounds: Normal heart sounds, S1 normal and S2 normal. No murmur.  Pulmonary:     Effort: No respiratory distress.     Breath sounds: Normal breath sounds. No stridor. No wheezing or rales.  Lymphadenopathy:     Head:     Right side of head: No tonsillar adenopathy.     Left side of head: No tonsillar adenopathy.     Cervical: No cervical adenopathy.  Skin:    Findings: No erythema or rash.     Nails: There is no clubbing.   Neurological:     Mental Status: She is alert.     Diagnostics:   The patient had an Asthma Control Test with the following results: ACT Total Score: 23.    Assessment and Plan:   1. Asthma, moderate persistent, well-controlled   2. Other allergic rhinitis   3. LPRD (laryngopharyngeal reflux disease)   4. NSAID induced gastritis     1. Continue Symbicort 160 2 inhalations 1-2 times per day depending on disease activity  2.  Use a combination of OTC Nasacort 1 spray each nostril 2 times a day plus Azelastine 1 spray each nostril 2 times a day during periods of upper airway symptoms  3. Continue Nexium 40 mg 1-2 tablets 1 time per day depending on disease activity  4. Continue Ventolin HFA if needed  5. Continue OTC antihistamine, nasal saline, if needed  6. Continue immunotherapy and EpiPen  7. For this episode can use the following:   A. Lots of nasal saline  B. Mucinex or  Mucinex DM  8. Start Meloxicam 15 mg - 1/2 to 1 tablet one time per day if needed  9. Return to clinic in 6 months or earlier if problem   Laurie Simmons appears to have a viral respiratory tract infection giving rise to sinusitis and at this point in time we will hold off on any antibiotics or antiviral therapy unless of course she becomes more acutely ill in the near future.  I have given her prescription for meloxicam (COX-2) given the fact that she has a history of nonsteroidal anti-inflammatory drug induced gastritis  and uses Tylenol on pretty much a daily basis for various issues.  She can find the dose of meloxicam that helps her regarding these issues whether that be 7.5 mg daily or 15 mg daily.  I will see her back in this clinic in 6 months or earlier if there is a problem while she continues on therapy directed against her respiratory track inflammatory condition and reflux.Allena Katz, MD Allergy / Immunology [place]

## 2018-07-30 NOTE — Patient Instructions (Signed)
  1. Continue Symbicort 160 2 inhalations 1-2 times per day depending on disease activity  2.  Use a combination of OTC Nasacort 1 spray each nostril 2 times a day plus Azelastine 1 spray each nostril 2 times a day during periods of upper airway symptoms  3. Continue Nexium 40 mg 1-2 tablets 1 time per day depending on disease activity  4. Continue Ventolin HFA if needed  5. Continue OTC antihistamine, nasal saline, if needed  6. Continue immunotherapy and EpiPen  7. For this episode can use the following:   A. Lots of nasal saline  B. Mucinex or Mucinex DM  8. Start Meloxicam 15 mg - 1/2 to 1 tablet one time per day if needed  9. Return to clinic in 6 months or earlier if problem

## 2018-08-03 ENCOUNTER — Encounter: Payer: Self-pay | Admitting: Allergy and Immunology

## 2018-08-03 ENCOUNTER — Telehealth: Payer: Self-pay | Admitting: *Deleted

## 2018-08-03 ENCOUNTER — Other Ambulatory Visit: Payer: Self-pay | Admitting: *Deleted

## 2018-08-03 MED ORDER — AMOXICILLIN-POT CLAVULANATE 875-125 MG PO TABS: ORAL_TABLET | ORAL | 0 refills | Status: DC

## 2018-08-03 NOTE — Telephone Encounter (Signed)
Stop the meloxicam. Use augmentin 875 1 tablet two times per day for 14 days.

## 2018-08-03 NOTE — Telephone Encounter (Signed)
Laurie Simmons calls stating that she has developed a rash on her legs after starting the Meloxicam. This was the only new medication she has started.  She said her sinus symptoms are getting worse, she is having yellow mucus production. Please advise.    [place]

## 2018-08-03 NOTE — Telephone Encounter (Signed)
Patient informed. 

## 2018-08-10 ENCOUNTER — Ambulatory Visit (INDEPENDENT_AMBULATORY_CARE_PROVIDER_SITE_OTHER): Payer: BC Managed Care – PPO

## 2018-08-10 ENCOUNTER — Other Ambulatory Visit: Payer: Self-pay | Admitting: Allergy and Immunology

## 2018-08-10 DIAGNOSIS — J309 Allergic rhinitis, unspecified: Secondary | ICD-10-CM

## 2018-08-11 DIAGNOSIS — J301 Allergic rhinitis due to pollen: Secondary | ICD-10-CM

## 2018-08-11 NOTE — Progress Notes (Signed)
EXP 08/12/19

## 2018-08-25 DIAGNOSIS — G5603 Carpal tunnel syndrome, bilateral upper limbs: Secondary | ICD-10-CM

## 2018-08-25 HISTORY — DX: Carpal tunnel syndrome, bilateral upper limbs: G56.03

## 2018-09-07 ENCOUNTER — Ambulatory Visit (INDEPENDENT_AMBULATORY_CARE_PROVIDER_SITE_OTHER): Payer: BC Managed Care – PPO | Admitting: *Deleted

## 2018-09-07 DIAGNOSIS — J309 Allergic rhinitis, unspecified: Secondary | ICD-10-CM | POA: Diagnosis not present

## 2018-09-26 ENCOUNTER — Other Ambulatory Visit: Payer: Self-pay | Admitting: Allergy and Immunology

## 2018-09-28 ENCOUNTER — Other Ambulatory Visit: Payer: Self-pay

## 2018-09-28 MED ORDER — SYMBICORT 160-4.5 MCG/ACT IN AERO: INHALATION_SPRAY | RESPIRATORY_TRACT | 4 refills | Status: DC

## 2018-10-01 ENCOUNTER — Ambulatory Visit (INDEPENDENT_AMBULATORY_CARE_PROVIDER_SITE_OTHER): Payer: BC Managed Care – PPO | Admitting: *Deleted

## 2018-10-01 DIAGNOSIS — J309 Allergic rhinitis, unspecified: Secondary | ICD-10-CM

## 2018-10-12 ENCOUNTER — Ambulatory Visit (INDEPENDENT_AMBULATORY_CARE_PROVIDER_SITE_OTHER): Payer: BC Managed Care – PPO | Admitting: Allergy and Immunology

## 2018-10-12 DIAGNOSIS — K219 Gastro-esophageal reflux disease without esophagitis: Secondary | ICD-10-CM | POA: Diagnosis not present

## 2018-10-12 DIAGNOSIS — J4541 Moderate persistent asthma with (acute) exacerbation: Secondary | ICD-10-CM

## 2018-10-12 DIAGNOSIS — J3089 Other allergic rhinitis: Secondary | ICD-10-CM

## 2018-10-12 MED ORDER — PREDNISONE 10 MG PO TABS: ORAL_TABLET | ORAL | 0 refills | Status: DC

## 2018-10-12 NOTE — Progress Notes (Signed)
[facility] [place] [facility] [place]   Follow-up Note  Referring Provider: Marco Collie, MD Primary Provider: Marco Collie, MD Date of Office Visit: 10/12/2018  Subjective:   Laurie Simmons (DOB: 09/13/1965) is a 53 y.o. female who returns to the [facility] on 10/12/2018 in re-evaluation of the following:  HPI: This is a tele-med contact requested by Cecille Rubin in reevaluation of her asthma and allergic rhinoconjunctivitis and reflux.  I last saw her in this clinic on 30 July 2018 at which point in time she was suffering from post influenza syndrome.    She continued to have problems, saw Dr. Marco Collie, and given Levaquin in February, then back to Dr. Nyra Capes, another Levaquin course beginning of March. Doing better, then past week, starting Thursday, low grade fever (99.4), face hurt around cheek bones, stuffy, runny nose, sinus PND, sniffling, no anosmia, nothing ugly other then slight yellow. Today, still stuffy, outdoor exposure makes her get CT, SOB which is helped with B2 agonist use. Recently still has lots of stuffiness.  No obvious contacts with individuals who have been sick.  Fever appears to have resolved.  She just increased her Symbicort to twice a day last week and she restarted her combination of nasal steroid and nasal antihistamine and is using a nasal saline wash.  She continues on immunotherapy which is going quite well without any adverse effects.  She continues to treat reflux which is under good control at this point in time.  Allergies as of 10/12/2018      Reactions   Prednisone Hives, Other (See Comments)   Mood Swings.   Codeine Anxiety      Medication List      albuterol 108 (90 Base) MCG/ACT inhaler Commonly known as:  PROVENTIL HFA;VENTOLIN HFA INHALE 2 PUFFS INTO THE LUNGS EVERY 4 HOURS IF NEEDED FOR WHEEZING OR SHORTNESS OF BREATH   amoxicillin-clavulanate 875-125 MG tablet Commonly known as:  Augmentin Take one  tablet twice daily for 14 days   Azelastine HCl 0.15 % Soln Can use one spray in each nostril two times daily if needed.   clorazepate 3.75 MG tablet Commonly known as:  TRANXENE TAKE 1/2 TO 1 TABLET BID   esomeprazole 40 MG capsule Commonly known as:  NEXIUM TAKE 1 CAPSULE BY MOUTH TWICE A DAY AS DIRECTED   Lo Loestrin Fe 1 MG-10 MCG / 10 MCG tablet Generic drug:  Norethindrone-Ethinyl Estradiol-Fe Biphas Take 1 tablet daily by mouth.   meloxicam 15 MG tablet Commonly known as:  MOBIC Can take one-half to one tablet by mouth once daily if needed.   Nasacort Allergy 24HR 55 MCG/ACT Aero nasal inhaler Generic drug:  triamcinolone Place 2 sprays into the nose daily.   rosuvastatin 5 MG tablet Commonly known as:  CRESTOR TK 1 T PO 2 TIMES WEEKLY   Symbicort 160-4.5 MCG/ACT inhaler Generic drug:  budesonide-formoterol Inhale two puffs twice daily to prevent cough or wheeze.  Rinse, gargle, and spit after use.   topiramate 25 MG tablet Commonly known as:  TOPAMAX 2 (two) times daily.   valsartan-hydrochlorothiazide 320-25 MG tablet Commonly known as:  DIOVAN-HCT Take 1 tablet by mouth daily.   Viibryd 40 MG Tabs Generic drug:  Vilazodone HCl daily.       Past Medical History:  Diagnosis Date  . Asthma   . Carpal tunnel syndrome   . Diabetes ([place])   . GERD (gastroesophageal reflux disease)   . Severe  anemia [id number]    Past Surgical History:  Procedure Laterality Date  . caesarean    . CHOLECYSTECTOMY    . HIP SURGERY Left     Review of systems negative except as noted in HPI / PMHx or noted below:  Review of Systems  Constitutional: Negative.   HENT: Negative.   Eyes: Negative.   Respiratory: Negative.   Cardiovascular: Negative.   Gastrointestinal: Negative.   Genitourinary: Negative.   Musculoskeletal: Negative.   Skin: Negative.   Neurological: Negative.   Endo/Heme/Allergies: Negative.   Psychiatric/Behavioral: Negative.      Objective:    There were no vitals filed for this visit.        Physical Exam-deferred  Diagnostics: none  Assessment and Plan:   1. Asthma, not well controlled, moderate persistent, with acute exacerbation   2. Other allergic rhinitis   3. LPRD (laryngopharyngeal reflux disease)     1. Continue Symbicort 160 2 inhalations 1-2 times per day depending on disease activity  2.  Use a combination of OTC Nasacort 1 spray each nostril 2 times a day plus Azelastine 1 spray each nostril 2 times a day during periods of upper airway symptoms  3. Continue Nexium 40 mg 1-2 tablets 1 time per day depending on disease activity  4. Continue Ventolin HFA if needed  5. Continue OTC antihistamine, nasal saline, if needed  6. Continue immunotherapy and EpiPen  7. For this episode can use the following:   A. Lots of nasal saline  B. Mucinex or Mucinex DM  C. Prednisone 10 mg 1 tablet daily x5 days, 1/2 tablet daily x5 days  8. Return to clinic in 6 months or earlier if problem   It appears that Roneisha has developed a viral upper respiratory tract infection that is giving rise to inflammation of both her upper and lower airways for which she will utilize a plan of therapy noted above which includes relatively low-dose systemic steroids.  I will assume that this issue will resolve over the course of the next week and Emberly will keep in contact with me noting her response to this approach.  Certainly if she remains with significant symptoms or develops new symptoms she may require further evaluation and treatment.  If she does well I will see her back in this clinic in 6 months or earlier if there is a problem.  Total patient interaction time 21 minutes  Allena Katz, MD [facility] [place] [facility]

## 2018-10-12 NOTE — Patient Instructions (Signed)
  1. Continue Symbicort 160 2 inhalations 1-2 times per day depending on disease activity  2.  Use a combination of OTC Nasacort 1 spray each nostril 2 times a day plus Azelastine 1 spray each nostril 2 times a day during periods of upper airway symptoms  3. Continue Nexium 40 mg 1-2 tablets 1 time per day depending on disease activity  4. Continue Ventolin HFA if needed  5. Continue OTC antihistamine, nasal saline, if needed  6. Continue immunotherapy and EpiPen  7. For this episode can use the following:   A. Lots of nasal saline  B. Mucinex or Mucinex DM  C. Prednisone 10 mg 1 tablet daily x5 days, 1/2 tablet daily x5 days  8. Return to clinic in 6 months or earlier if problem

## 2018-10-13 ENCOUNTER — Encounter: Payer: Self-pay | Admitting: Allergy and Immunology

## 2018-10-22 ENCOUNTER — Ambulatory Visit (INDEPENDENT_AMBULATORY_CARE_PROVIDER_SITE_OTHER): Payer: BC Managed Care – PPO | Admitting: *Deleted

## 2018-10-22 DIAGNOSIS — J309 Allergic rhinitis, unspecified: Secondary | ICD-10-CM | POA: Diagnosis not present

## 2018-11-05 ENCOUNTER — Ambulatory Visit (INDEPENDENT_AMBULATORY_CARE_PROVIDER_SITE_OTHER): Payer: BC Managed Care – PPO | Admitting: *Deleted

## 2018-11-05 DIAGNOSIS — J309 Allergic rhinitis, unspecified: Secondary | ICD-10-CM | POA: Diagnosis not present

## 2018-11-10 NOTE — Progress Notes (Addendum)
Provider is at the office. Start Time: 10:33 am  End Time: 10:46 am Verbal consent given to bill insurance.

## 2018-11-19 ENCOUNTER — Ambulatory Visit (INDEPENDENT_AMBULATORY_CARE_PROVIDER_SITE_OTHER): Payer: BC Managed Care – PPO | Admitting: *Deleted

## 2018-11-19 DIAGNOSIS — J309 Allergic rhinitis, unspecified: Secondary | ICD-10-CM

## 2018-12-03 ENCOUNTER — Ambulatory Visit (INDEPENDENT_AMBULATORY_CARE_PROVIDER_SITE_OTHER): Payer: BC Managed Care – PPO | Admitting: *Deleted

## 2018-12-03 DIAGNOSIS — J309 Allergic rhinitis, unspecified: Secondary | ICD-10-CM | POA: Diagnosis not present

## 2018-12-17 ENCOUNTER — Ambulatory Visit (INDEPENDENT_AMBULATORY_CARE_PROVIDER_SITE_OTHER): Payer: BC Managed Care – PPO | Admitting: *Deleted

## 2018-12-17 DIAGNOSIS — J309 Allergic rhinitis, unspecified: Secondary | ICD-10-CM | POA: Diagnosis not present

## 2018-12-23 ENCOUNTER — Ambulatory Visit (INDEPENDENT_AMBULATORY_CARE_PROVIDER_SITE_OTHER): Payer: BC Managed Care – PPO | Admitting: Allergy and Immunology

## 2018-12-23 ENCOUNTER — Encounter: Payer: Self-pay | Admitting: Allergy and Immunology

## 2018-12-23 ENCOUNTER — Other Ambulatory Visit: Payer: Self-pay

## 2018-12-23 VITALS — BP 140/92 | HR 75 | Resp 16

## 2018-12-23 DIAGNOSIS — J3089 Other allergic rhinitis: Secondary | ICD-10-CM | POA: Diagnosis not present

## 2018-12-23 DIAGNOSIS — K219 Gastro-esophageal reflux disease without esophagitis: Secondary | ICD-10-CM

## 2018-12-23 DIAGNOSIS — J4540 Moderate persistent asthma, uncomplicated: Secondary | ICD-10-CM | POA: Diagnosis not present

## 2018-12-23 NOTE — Progress Notes (Signed)
[facility] [place] [facility] [place]   Follow-up Note  Referring Provider: Marco Collie, MD Primary Provider: Marco Collie, MD Date of Office Visit: 12/23/2018  Subjective:   Laurie Simmons (DOB: [birth year]) is a 53 y.o. female who returns to the [facility] on 12/23/2018 in re-evaluation of the following:  HPI: Demetress returns to this clinic in reevaluation of asthma and allergic rhinoconjunctivitis and reflux.  Her last visit to this clinic was a E-med visit on 12 October 2018 at which point in time she appeared to have a respiratory tract infection that was addressed with her primary care doctor with antibiotics and addressed in this clinic with a systemic steroid.  Since her last visit she has done relatively well with her asthma.  She did have 1 week during heavy pollination season when she had to use her bronchodilator every day but other than that 1 week she has done well with rare use of a bronchodilator and no requirement for additional systemic steroids.  Her nose is doing relatively well but she always has the sensation that there is some congestion on the right side.  When she performs a nasal saline rinse she definitely gets fluid through that side so there is no obstruction but she just has a sensation is a little bit full.  She is now using just a nasal antihistamine and not a nasal steroid.  Her reflux is occasionally active with burning up into her throat at least a few times a week especially over the course of the past several days.  This appears to occur a few hours after breakfast.  This occurs while using Nexium mostly 1 time per day.  She has cheese toast in the morning along with yogurt.  Her immunotherapy is going quite well without any adverse effect currently at every 3 weeks.  She questions whether or not cat extract should be introduced to her immunotherapy as she will be obtaining a cat sometime in the near future once her daughter  leaves for college.  Allergies as of 12/23/2018      Reactions   Prednisone Hives, Other (See Comments)   Mood Swings.   Codeine Anxiety      Medication List      albuterol 108 (90 Base) MCG/ACT inhaler Commonly known as:  VENTOLIN HFA INHALE 2 PUFFS INTO THE LUNGS EVERY 4 HOURS IF NEEDED FOR WHEEZING OR SHORTNESS OF BREATH   Azelastine HCl 0.15 % Soln Can use one spray in each nostril two times daily if needed.   clorazepate 3.75 MG tablet Commonly known as:  TRANXENE TAKE 1/2 TO 1 TABLET BID   esomeprazole 40 MG capsule Commonly known as:  NEXIUM TAKE 1 CAPSULE BY MOUTH TWICE A DAY AS DIRECTED   Lo Loestrin Fe 1 MG-10 MCG / 10 MCG tablet Generic drug:  Norethindrone-Ethinyl Estradiol-Fe Biphas Take 1 tablet daily by mouth.   Nasacort Allergy 24HR 55 MCG/ACT Aero nasal inhaler Generic drug:  triamcinolone Place 2 sprays into the nose daily.   rosuvastatin 5 MG tablet Commonly known as:  CRESTOR TK 1 T PO 2 TIMES WEEKLY   Symbicort 160-4.5 MCG/ACT inhaler Generic drug:  budesonide-formoterol Inhale two puffs twice daily to prevent cough or wheeze.  Rinse, gargle, and spit after use.   topiramate 25 MG tablet Commonly known as:  TOPAMAX 2 (two) times daily.   valsartan-hydrochlorothiazide 320-25 MG tablet Commonly known as:  DIOVAN-HCT Take 1 tablet by mouth daily.  Viibryd 40 MG Tabs Generic drug:  Vilazodone HCl daily.       Past Medical History:  Diagnosis Date  . Asthma   . Carpal tunnel syndrome   . Diabetes ([place])   . GERD (gastroesophageal reflux disease)   . Severe anemia [id number]    Past Surgical History:  Procedure Laterality Date  . caesarean    . CHOLECYSTECTOMY    . HIP SURGERY Left     Review of systems negative except as noted in HPI / PMHx or noted below:  Review of Systems  Constitutional: Negative.   HENT: Negative.   Eyes: Negative.   Respiratory: Negative.   Cardiovascular: Negative.   Gastrointestinal: Negative.    Genitourinary: Negative.   Musculoskeletal: Negative.   Skin: Negative.   Neurological: Negative.   Endo/Heme/Allergies: Negative.   Psychiatric/Behavioral: Negative.      Objective:   Vitals:   12/23/18 1051  BP: (!) 140/92  Pulse: 75  Resp: 16          Physical Exam Constitutional:      Appearance: She is not diaphoretic.  HENT:     Head: Normocephalic.     Right Ear: Tympanic membrane, ear canal and external ear normal.     Left Ear: Tympanic membrane, ear canal and external ear normal.     Nose: Nose normal. No mucosal edema or rhinorrhea.     Mouth/Throat:     Pharynx: Uvula midline. No oropharyngeal exudate.  Eyes:     Conjunctiva/sclera: Conjunctivae normal.  Neck:     Thyroid: No thyromegaly.     Trachea: Trachea normal. No tracheal tenderness or tracheal deviation.  Cardiovascular:     Rate and Rhythm: Normal rate and regular rhythm.     Heart sounds: Normal heart sounds, S1 normal and S2 normal. No murmur.  Pulmonary:     Effort: No respiratory distress.     Breath sounds: Normal breath sounds. No stridor. No wheezing or rales.  Lymphadenopathy:     Head:     Right side of head: No tonsillar adenopathy.     Left side of head: No tonsillar adenopathy.     Cervical: No cervical adenopathy.  Skin:    Findings: No erythema or rash.     Nails: There is no clubbing.   Neurological:     Mental Status: She is alert.     Diagnostics:    Spirometry was performed and demonstrated an FEV1 of 2.93 at 105 % of predicted.  Assessment and Plan:   1. Asthma, moderate persistent, well-controlled   2. Other allergic rhinitis   3. LPRD (laryngopharyngeal reflux disease)     1. Continue Symbicort 160 2 inhalations 1-2 times per day depending on disease activity  2.  Continue Azelastine 1 spray each nostril 1-2 times per day  Depending on disease activity  3. Continue Nexium 40 mg 1-2 tablets 1 time per day depending on disease activity.   4. Continue  Ventolin HFA if needed  5. Continue OTC antihistamine, nasal saline, if needed  6. Continue immunotherapy and EpiPen  7. Can restart Nasacort - 1 spray each nostril 1-2 times per day  8. Consider decreasing cheese consumption at breakfast.  9. Blood - Area 2 aeroallergen profile, CBC w/ diff.  Cat? Other?  10. Return to clinic in 6 months or earlier if problem   Overall it sounds as though Meliana is doing okay with her airway issue and reflux issue but certainly I think that she  could do a little bit better.  She has the option of using her Symbicort twice a day and restarting a nasal steroid to help with her airway issue and she has the option of eliminating cheese consumption in the morning and may be increasing her Nexium to twice a day to address her reflux issue.  Because she is anticipating obtaining a cat with inside the household within the near future we will see if there are IgE antibodies directed against cat and if so we will start her on Immunotherapy against cat.  I will contact her with the results of her blood test once they are available for review.   Allena Katz, MD Allergy / Immunology [place]

## 2018-12-23 NOTE — Patient Instructions (Addendum)
  1. Continue Symbicort 160 2 inhalations 1-2 times per day depending on disease activity  2.  Continue Azelastine 1 spray each nostril 1-2 times per day  Depending on disease activity  3. Continue Nexium 40 mg 1-2 tablets 1 time per day depending on disease activity.   4. Continue Ventolin HFA if needed  5. Continue OTC antihistamine, nasal saline, if needed  6. Continue immunotherapy and EpiPen  7. Can restart Nasacort - 1 spray each nostril 1-2 times per day  8. Consider decreasing cheese consumption at breakfast.  9. Blood - Area 2 aeroallergen profile, CBC w/ diff.  Cat? Other?  10. Return to clinic in 6 months or earlier if problem

## 2018-12-24 ENCOUNTER — Encounter: Payer: Self-pay | Admitting: Allergy and Immunology

## 2018-12-25 ENCOUNTER — Other Ambulatory Visit: Payer: Self-pay | Admitting: Allergy and Immunology

## 2018-12-26 LAB — ALLERGENS W/TOTAL IGE AREA 2
Alternaria Alternata IgE: <0.1 kU/L
Aspergillus Fumigatus IgE: <0.1 kU/L
Bermuda Grass IgE: <0.1 kU/L
Cat Dander IgE: 0.12 kU/L — AB
Cedar, Mountain IgE: <0.1 kU/L
Cladosporium Herbarum IgE: <0.1 kU/L
Cockroach, German IgE: <0.1 kU/L
Common Silver Birch IgE: <0.1 kU/L
Cottonwood IgE: <0.1 kU/L
D Farinae IgE: <0.1 kU/L
D Pteronyssinus IgE: <0.1 kU/L
Dog Dander IgE: <0.1 kU/L
Elm, American IgE: <0.1 kU/L
IgE (Immunoglobulin E), Serum: 18 IU/mL (ref 6–495)
Johnson Grass IgE: <0.1 kU/L
Maple/Box Elder IgE: <0.1 kU/L
Mouse Urine IgE: <0.1 kU/L
Oak, White IgE: <0.1 kU/L
Pecan, Hickory IgE: <0.1 kU/L
Penicillium Chrysogen IgE: <0.1 kU/L
Pigweed, Rough IgE: <0.1 kU/L
Ragweed, Short IgE: <0.1 kU/L
Sheep Sorrel IgE Qn: <0.1 kU/L
Timothy Grass IgE: <0.1 kU/L
White Mulberry IgE: <0.1 kU/L

## 2018-12-26 LAB — CBC WITH DIFFERENTIAL
Basophils Absolute: 0.1 10*3/uL (ref 0.0–0.2)
Basos: 1 %
EOS (ABSOLUTE): 0.2 10*3/uL (ref 0.0–0.4)
Eos: 2 %
Hematocrit: 36.4 % (ref 34.0–46.6)
Hemoglobin: 11.6 g/dL (ref 11.1–15.9)
Immature Grans (Abs): 0 10*3/uL (ref 0.0–0.1)
Immature Granulocytes: 0 %
Lymphocytes Absolute: 2.8 10*3/uL (ref 0.7–3.1)
Lymphs: 28 %
MCH: 22.3 pg — ABNORMAL LOW (ref 26.6–33.0)
MCHC: 31.9 g/dL (ref 31.5–35.7)
MCV: 70 fL — ABNORMAL LOW (ref 79–97)
Monocytes Absolute: 0.7 10*3/uL (ref 0.1–0.9)
Monocytes: 7 %
Neutrophils Absolute: 6 10*3/uL (ref 1.4–7.0)
Neutrophils: 62 %
RBC: 5.21 x10E6/uL (ref 3.77–5.28)
RDW: 16.9 % — ABNORMAL HIGH (ref 11.7–15.4)
WBC: 9.8 10*3/uL (ref 3.4–10.8)

## 2019-01-04 ENCOUNTER — Other Ambulatory Visit: Payer: Self-pay | Admitting: Allergy and Immunology

## 2019-01-04 DIAGNOSIS — J3089 Other allergic rhinitis: Secondary | ICD-10-CM

## 2019-01-04 DIAGNOSIS — J4540 Moderate persistent asthma, uncomplicated: Secondary | ICD-10-CM

## 2019-01-04 NOTE — Progress Notes (Signed)
EXP 01/06/20

## 2019-01-06 DIAGNOSIS — J3081 Allergic rhinitis due to animal (cat) (dog) hair and dander: Secondary | ICD-10-CM

## 2019-01-07 ENCOUNTER — Ambulatory Visit (INDEPENDENT_AMBULATORY_CARE_PROVIDER_SITE_OTHER): Payer: BC Managed Care – PPO

## 2019-01-07 DIAGNOSIS — J3089 Other allergic rhinitis: Secondary | ICD-10-CM | POA: Diagnosis not present

## 2019-01-14 ENCOUNTER — Ambulatory Visit (INDEPENDENT_AMBULATORY_CARE_PROVIDER_SITE_OTHER): Payer: BC Managed Care – PPO | Admitting: *Deleted

## 2019-01-14 DIAGNOSIS — J309 Allergic rhinitis, unspecified: Secondary | ICD-10-CM

## 2019-01-21 ENCOUNTER — Ambulatory Visit (INDEPENDENT_AMBULATORY_CARE_PROVIDER_SITE_OTHER): Payer: BC Managed Care – PPO | Admitting: *Deleted

## 2019-01-21 DIAGNOSIS — R319 Hematuria, unspecified: Secondary | ICD-10-CM | POA: Diagnosis not present

## 2019-01-21 DIAGNOSIS — I10 Essential (primary) hypertension: Secondary | ICD-10-CM | POA: Diagnosis not present

## 2019-01-21 DIAGNOSIS — E785 Hyperlipidemia, unspecified: Secondary | ICD-10-CM | POA: Diagnosis not present

## 2019-01-21 DIAGNOSIS — E1169 Type 2 diabetes mellitus with other specified complication: Secondary | ICD-10-CM | POA: Diagnosis not present

## 2019-01-21 DIAGNOSIS — J309 Allergic rhinitis, unspecified: Secondary | ICD-10-CM | POA: Diagnosis not present

## 2019-01-28 ENCOUNTER — Ambulatory Visit (INDEPENDENT_AMBULATORY_CARE_PROVIDER_SITE_OTHER): Payer: BC Managed Care – PPO | Admitting: *Deleted

## 2019-01-28 DIAGNOSIS — J309 Allergic rhinitis, unspecified: Secondary | ICD-10-CM

## 2019-02-04 ENCOUNTER — Ambulatory Visit (INDEPENDENT_AMBULATORY_CARE_PROVIDER_SITE_OTHER): Payer: BC Managed Care – PPO | Admitting: *Deleted

## 2019-02-04 DIAGNOSIS — J309 Allergic rhinitis, unspecified: Secondary | ICD-10-CM | POA: Diagnosis not present

## 2019-02-11 ENCOUNTER — Ambulatory Visit (INDEPENDENT_AMBULATORY_CARE_PROVIDER_SITE_OTHER): Payer: BC Managed Care – PPO | Admitting: *Deleted

## 2019-02-11 DIAGNOSIS — J309 Allergic rhinitis, unspecified: Secondary | ICD-10-CM | POA: Diagnosis not present

## 2019-02-16 ENCOUNTER — Ambulatory Visit (INDEPENDENT_AMBULATORY_CARE_PROVIDER_SITE_OTHER): Payer: BC Managed Care – PPO | Admitting: *Deleted

## 2019-02-16 DIAGNOSIS — J309 Allergic rhinitis, unspecified: Secondary | ICD-10-CM

## 2019-02-23 ENCOUNTER — Ambulatory Visit (INDEPENDENT_AMBULATORY_CARE_PROVIDER_SITE_OTHER): Payer: BC Managed Care – PPO | Admitting: *Deleted

## 2019-02-23 DIAGNOSIS — J309 Allergic rhinitis, unspecified: Secondary | ICD-10-CM | POA: Diagnosis not present

## 2019-03-02 ENCOUNTER — Ambulatory Visit (INDEPENDENT_AMBULATORY_CARE_PROVIDER_SITE_OTHER): Payer: BC Managed Care – PPO | Admitting: *Deleted

## 2019-03-02 DIAGNOSIS — Z6839 Body mass index (BMI) 39.0-39.9, adult: Secondary | ICD-10-CM | POA: Diagnosis not present

## 2019-03-02 DIAGNOSIS — J309 Allergic rhinitis, unspecified: Secondary | ICD-10-CM

## 2019-03-02 DIAGNOSIS — E1169 Type 2 diabetes mellitus with other specified complication: Secondary | ICD-10-CM | POA: Diagnosis not present

## 2019-03-09 ENCOUNTER — Ambulatory Visit (INDEPENDENT_AMBULATORY_CARE_PROVIDER_SITE_OTHER): Payer: BC Managed Care – PPO | Admitting: *Deleted

## 2019-03-09 DIAGNOSIS — E1169 Type 2 diabetes mellitus with other specified complication: Secondary | ICD-10-CM | POA: Diagnosis not present

## 2019-03-09 DIAGNOSIS — J309 Allergic rhinitis, unspecified: Secondary | ICD-10-CM

## 2019-03-09 DIAGNOSIS — E785 Hyperlipidemia, unspecified: Secondary | ICD-10-CM | POA: Diagnosis not present

## 2019-03-09 DIAGNOSIS — Z6839 Body mass index (BMI) 39.0-39.9, adult: Secondary | ICD-10-CM | POA: Diagnosis not present

## 2019-03-16 ENCOUNTER — Ambulatory Visit (INDEPENDENT_AMBULATORY_CARE_PROVIDER_SITE_OTHER): Payer: BC Managed Care – PPO | Admitting: *Deleted

## 2019-03-16 DIAGNOSIS — J309 Allergic rhinitis, unspecified: Secondary | ICD-10-CM

## 2019-03-23 ENCOUNTER — Ambulatory Visit (INDEPENDENT_AMBULATORY_CARE_PROVIDER_SITE_OTHER): Payer: BC Managed Care – PPO | Admitting: *Deleted

## 2019-03-23 DIAGNOSIS — J309 Allergic rhinitis, unspecified: Secondary | ICD-10-CM | POA: Diagnosis not present

## 2019-03-30 ENCOUNTER — Ambulatory Visit (INDEPENDENT_AMBULATORY_CARE_PROVIDER_SITE_OTHER): Payer: BC Managed Care – PPO | Admitting: *Deleted

## 2019-03-30 DIAGNOSIS — J309 Allergic rhinitis, unspecified: Secondary | ICD-10-CM

## 2019-04-06 ENCOUNTER — Ambulatory Visit (INDEPENDENT_AMBULATORY_CARE_PROVIDER_SITE_OTHER): Payer: BC Managed Care – PPO | Admitting: *Deleted

## 2019-04-06 DIAGNOSIS — J309 Allergic rhinitis, unspecified: Secondary | ICD-10-CM

## 2019-04-13 ENCOUNTER — Ambulatory Visit (INDEPENDENT_AMBULATORY_CARE_PROVIDER_SITE_OTHER): Payer: BC Managed Care – PPO | Admitting: *Deleted

## 2019-04-13 DIAGNOSIS — J309 Allergic rhinitis, unspecified: Secondary | ICD-10-CM

## 2019-04-15 ENCOUNTER — Other Ambulatory Visit: Payer: Self-pay | Admitting: *Deleted

## 2019-04-15 MED ORDER — ALBUTEROL SULFATE HFA 108 (90 BASE) MCG/ACT IN AERS: INHALATION_SPRAY | RESPIRATORY_TRACT | 1 refills | Status: DC

## 2019-04-20 ENCOUNTER — Ambulatory Visit (INDEPENDENT_AMBULATORY_CARE_PROVIDER_SITE_OTHER): Payer: BC Managed Care – PPO | Admitting: *Deleted

## 2019-04-20 DIAGNOSIS — J309 Allergic rhinitis, unspecified: Secondary | ICD-10-CM | POA: Diagnosis not present

## 2019-04-22 DIAGNOSIS — Z6839 Body mass index (BMI) 39.0-39.9, adult: Secondary | ICD-10-CM | POA: Diagnosis not present

## 2019-04-22 DIAGNOSIS — I10 Essential (primary) hypertension: Secondary | ICD-10-CM | POA: Diagnosis not present

## 2019-04-22 DIAGNOSIS — E785 Hyperlipidemia, unspecified: Secondary | ICD-10-CM | POA: Diagnosis not present

## 2019-04-22 DIAGNOSIS — E1169 Type 2 diabetes mellitus with other specified complication: Secondary | ICD-10-CM | POA: Diagnosis not present

## 2019-04-29 ENCOUNTER — Ambulatory Visit (INDEPENDENT_AMBULATORY_CARE_PROVIDER_SITE_OTHER): Payer: BC Managed Care – PPO | Admitting: *Deleted

## 2019-04-29 DIAGNOSIS — J309 Allergic rhinitis, unspecified: Secondary | ICD-10-CM

## 2019-05-04 ENCOUNTER — Ambulatory Visit (INDEPENDENT_AMBULATORY_CARE_PROVIDER_SITE_OTHER): Payer: BC Managed Care – PPO | Admitting: *Deleted

## 2019-05-04 DIAGNOSIS — Z6839 Body mass index (BMI) 39.0-39.9, adult: Secondary | ICD-10-CM | POA: Diagnosis not present

## 2019-05-04 DIAGNOSIS — D219 Benign neoplasm of connective and other soft tissue, unspecified: Secondary | ICD-10-CM | POA: Insufficient documentation

## 2019-05-04 DIAGNOSIS — J309 Allergic rhinitis, unspecified: Secondary | ICD-10-CM

## 2019-05-04 HISTORY — DX: Benign neoplasm of connective and other soft tissue, unspecified: D21.9

## 2019-05-11 ENCOUNTER — Ambulatory Visit (INDEPENDENT_AMBULATORY_CARE_PROVIDER_SITE_OTHER): Payer: BC Managed Care – PPO | Admitting: *Deleted

## 2019-05-11 DIAGNOSIS — J309 Allergic rhinitis, unspecified: Secondary | ICD-10-CM | POA: Diagnosis not present

## 2019-05-18 ENCOUNTER — Ambulatory Visit (INDEPENDENT_AMBULATORY_CARE_PROVIDER_SITE_OTHER): Payer: BC Managed Care – PPO | Admitting: *Deleted

## 2019-05-18 DIAGNOSIS — J309 Allergic rhinitis, unspecified: Secondary | ICD-10-CM

## 2019-05-27 ENCOUNTER — Ambulatory Visit (INDEPENDENT_AMBULATORY_CARE_PROVIDER_SITE_OTHER): Payer: BC Managed Care – PPO | Admitting: *Deleted

## 2019-05-27 DIAGNOSIS — J309 Allergic rhinitis, unspecified: Secondary | ICD-10-CM

## 2019-05-31 ENCOUNTER — Other Ambulatory Visit: Payer: Self-pay

## 2019-05-31 ENCOUNTER — Ambulatory Visit (INDEPENDENT_AMBULATORY_CARE_PROVIDER_SITE_OTHER): Payer: BC Managed Care – PPO | Admitting: Adult Health

## 2019-05-31 ENCOUNTER — Encounter: Payer: Self-pay | Admitting: Adult Health

## 2019-05-31 DIAGNOSIS — F411 Generalized anxiety disorder: Secondary | ICD-10-CM | POA: Diagnosis not present

## 2019-05-31 DIAGNOSIS — F39 Unspecified mood [affective] disorder: Secondary | ICD-10-CM

## 2019-05-31 DIAGNOSIS — F410 Panic disorder [episodic paroxysmal anxiety] without agoraphobia: Secondary | ICD-10-CM

## 2019-05-31 DIAGNOSIS — F331 Major depressive disorder, recurrent, moderate: Secondary | ICD-10-CM

## 2019-05-31 NOTE — Progress Notes (Signed)
Laurie Simmons [id number] 04-04-1966 53 y.o.  Subjective:   Patient ID:  Laurie Simmons is a 53 y.o. (DOB [birth year]) female.  Chief Complaint:  Chief Complaint  Patient presents with  . Depression  . Anxiety  . Other    Mood disorder  . Panic Attack    HPI Laurie Simmons presents to the office today for follow-up of anxiety,depression, and mood disorder.  Describes mood today as "ok". Pleasant. Mood symptoms - depression, anxiety, and irritability. Stable interest and motivation. Taking medications as prescribed. Stating "I still have anxiety depending on "what's going on". Had a depressive episode a few weeks ago - "lost interest and motivation". Stating "I didn't care about doing anything". Also stating "It has "gotten better". Is concerned she may be going through "menopause". Feels "stressed" with election, pandemic, and the school situation. Husband taking online classes - graduates in December. Daughter had to come home from [place]. Stating "it's been totally chaotic in my house". Stating "I'm done with this pandemic stuff". Feels like the depression may have been a "combination of everything". Anxiety is better on a "daily basis". Has "flashbacks" about being a Pharmacist, hospital. Has "mini panic attacks" at times lasting a "few minutes". Some ruminations and irrational thoughts. Has applied for disability benefits and has a hearing the first of the year. Stating "I go into panic mode just thinking about going to work again". Saw another provider about anxiety and was started on Hydroxyzine. Varying interest and motivation. Taking medications as prescribed.  Energy levels mostly stable. Active, does not have a regular exercise routine. Retired. Enjoys some usual interests and activities. Spending time with family - husband and 2 children son 84 and daughter 28. Mostly staying home. Appetite adequate. Weight stable. Sleeps well most nights. Averages 6 to 8 hours. Focus and concentration stable. Completing  tasks. Managing aspects of household.  Denies SI or HI. Denies AH or VH.  Review of Systems:  Review of Systems  Musculoskeletal: Negative for gait problem.  Neurological: Negative for tremors.  Psychiatric/Behavioral:       Please refer to HPI   Medications: I have reviewed the patient's current medications.  Current Outpatient Medications  Medication Sig Dispense Refill  . albuterol (VENTOLIN HFA) 108 (90 Base) MCG/ACT inhaler Inhale two puffs every 4-6 hours if needed for cough or wheeze 18 g 1  . Azelastine HCl 0.15 % SOLN Can use one spray in each nostril two times daily if needed. 90 mL 1  . budesonide-formoterol (SYMBICORT) 160-4.5 MCG/ACT inhaler INHALE 2 PUFFS INTO LUNGS TWICE DAILY TO PREVENT COUGH OF WHEEZE 10.2 g 5  . esomeprazole (NEXIUM) 40 MG capsule TAKE 1 CAPSULE BY MOUTH TWICE A DAY AS DIRECTED 60 capsule 5  . LO LOESTRIN FE 1 MG-10 MCG / 10 MCG tablet Take 1 tablet daily by mouth.  0  . rosuvastatin (CRESTOR) 5 MG tablet TK 1 T PO 2 TIMES WEEKLY  1  . topiramate (TOPAMAX) 25 MG tablet 2 (two) times daily.  0  . triamcinolone (NASACORT ALLERGY 24HR) 55 MCG/ACT AERO nasal inhaler Place 2 sprays into the nose daily.    . valsartan-hydrochlorothiazide (DIOVAN-HCT) 320-25 MG tablet Take 1 tablet by mouth daily.  0  . VIIBRYD 40 MG TABS daily.   0   No current facility-administered medications for this visit.     Medication Side Effects: None  Allergies:  Allergies  Allergen Reactions  . Prednisone Hives and Other (See Comments)    Mood Swings.  Marland Kitchen  Codeine Anxiety    Past Medical History:  Diagnosis Date  . Asthma   . Carpal tunnel syndrome   . Diabetes ([place])   . GERD (gastroesophageal reflux disease)   . Severe anemia [id number]    Family History  Problem Relation Age of Onset  . Pulmonary fibrosis Father     Social History   Socioeconomic History  . Marital status: Married    Spouse name: Not on file  . Number of children: Not on file  . Years of  education: Not on file  . Highest education level: Not on file  Occupational History  . Not on file  Social Needs  . Financial resource strain: Not on file  . Food insecurity    Worry: Not on file    Inability: Not on file  . Transportation needs    Medical: Not on file    Non-medical: Not on file  Tobacco Use  . Smoking status: Never Smoker  . Smokeless tobacco: Never Used  Substance and Sexual Activity  . Alcohol use: No  . Drug use: No  . Sexual activity: Not on file  Lifestyle  . Physical activity    Days per week: Not on file    Minutes per session: Not on file  . Stress: Not on file  Relationships  . Social Herbalist on phone: Not on file    Gets together: Not on file    Attends religious service: Not on file    Active member of club or organization: Not on file    Attends meetings of clubs or organizations: Not on file    Relationship status: Not on file  . Intimate partner violence    Fear of current or ex partner: Not on file    Emotionally abused: Not on file    Physically abused: Not on file    Forced sexual activity: Not on file  Other Topics Concern  . Not on file  Social History Narrative  . Not on file    Past Medical History, Surgical history, Social history, and Family history were reviewed and updated as appropriate.   Please see review of systems for further details on the patient's review from today.   Objective:   Physical Exam:  There were no vitals taken for this visit.  Physical Exam Constitutional:      General: She is not in acute distress.    Appearance: She is well-developed.  Neurological:     Mental Status: She is alert and oriented to person, place, and time.     Coordination: Coordination normal.  Psychiatric:        Attention and Perception: Attention and perception normal. She does not perceive auditory or visual hallucinations.        Mood and Affect: Mood is anxious and depressed. Affect is flat. Affect is not  labile, blunt, angry or inappropriate.        Speech: Speech normal.        Behavior: Behavior normal.        Thought Content: Thought content normal. Thought content is not paranoid or delusional. Thought content does not include homicidal or suicidal ideation. Thought content does not include homicidal or suicidal plan.        Cognition and Memory: Cognition and memory normal.        Judgment: Judgment normal.     Comments: Insight intact     Lab Review:     Component Value Date/Time  NA 135 10/20/2008 2105   K 4.5 10/20/2008 2105   CL 106 10/20/2008 2105   CO2 22 10/20/2008 2105   GLUCOSE 97 10/21/2008 0545   BUN 16 10/20/2008 2105   CREATININE 0.76 10/20/2008 2105   CALCIUM 9.7 10/20/2008 2105   PROT 6.0 10/20/2008 2105   ALBUMIN 3.1 (L) 10/20/2008 2105   AST 24 10/20/2008 2105   ALT 19 10/20/2008 2105   ALKPHOS 94 10/20/2008 2105   BILITOT 0.2 (L) 10/20/2008 2105   GFRNONAA >60 10/20/2008 2105   GFRAA  10/20/2008 2105    >60        The eGFR has been calculated using the MDRD equation. This calculation has not been validated in all clinical situations. eGFR's persistently <60 mL/min signify possible Chronic Kidney Disease.       Component Value Date/Time   WBC 9.8 12/23/2018 1146   WBC 12.3 (H) 10/22/2008 0530   RBC 5.21 12/23/2018 1146   RBC 3.19 (L) 10/22/2008 0530   HGB 11.6 12/23/2018 1146   HCT 36.4 12/23/2018 1146   PLT 228 10/22/2008 0530   MCV 70 (L) 12/23/2018 1146   MCH 22.3 (L) 12/23/2018 1146   MCHC 31.9 12/23/2018 1146   MCHC 33.7 10/22/2008 0530   RDW 16.9 (H) 12/23/2018 1146   LYMPHSABS 2.8 12/23/2018 1146   EOSABS 0.2 12/23/2018 1146   BASOSABS 0.1 12/23/2018 1146    No results found for: POCLITH, LITHIUM   No results found for: PHENYTOIN, PHENOBARB, VALPROATE, CBMZ   .res Assessment: Plan:    Plan:  1. Continue Viibyrd 84m daily 2. Continue Hydroxyzine 189mBID prn anxiety. 3. Continue Topamax 25 mg BID  Contact therapist    RTC 2 months  Patient advised to contact office with any questions, adverse effects, or acute worsening in signs and symptoms.  Lo[place]as seen today for depression, anxiety, other and panic attack.  Diagnoses and all orders for this visit:  Major depressive disorder, recurrent episode, moderate (HCC)  Generalized anxiety disorder  Episodic mood disorder (HCC)  Panic attacks    Please see After Visit Summary for patient specific instructions.  Future Appointments  Date Time Provider [place]/04/2019 11:00 AM Kozlow, ErDonnamarie PoagMD [facility] None    No orders of the defined types were placed in this encounter.   -------------------------------

## 2019-06-01 ENCOUNTER — Ambulatory Visit (INDEPENDENT_AMBULATORY_CARE_PROVIDER_SITE_OTHER): Payer: BC Managed Care – PPO

## 2019-06-01 DIAGNOSIS — J309 Allergic rhinitis, unspecified: Secondary | ICD-10-CM | POA: Diagnosis not present

## 2019-06-08 ENCOUNTER — Ambulatory Visit (INDEPENDENT_AMBULATORY_CARE_PROVIDER_SITE_OTHER): Payer: BC Managed Care – PPO | Admitting: *Deleted

## 2019-06-08 DIAGNOSIS — J309 Allergic rhinitis, unspecified: Secondary | ICD-10-CM

## 2019-06-14 DIAGNOSIS — R42 Dizziness and giddiness: Secondary | ICD-10-CM | POA: Diagnosis not present

## 2019-06-14 DIAGNOSIS — N920 Excessive and frequent menstruation with regular cycle: Secondary | ICD-10-CM | POA: Diagnosis not present

## 2019-06-14 DIAGNOSIS — D252 Subserosal leiomyoma of uterus: Secondary | ICD-10-CM | POA: Diagnosis not present

## 2019-06-14 DIAGNOSIS — N938 Other specified abnormal uterine and vaginal bleeding: Secondary | ICD-10-CM | POA: Diagnosis not present

## 2019-06-17 ENCOUNTER — Ambulatory Visit (INDEPENDENT_AMBULATORY_CARE_PROVIDER_SITE_OTHER): Payer: BC Managed Care – PPO | Admitting: *Deleted

## 2019-06-17 DIAGNOSIS — J309 Allergic rhinitis, unspecified: Secondary | ICD-10-CM | POA: Diagnosis not present

## 2019-06-18 DIAGNOSIS — J301 Allergic rhinitis due to pollen: Secondary | ICD-10-CM

## 2019-06-24 ENCOUNTER — Ambulatory Visit: Payer: Self-pay

## 2019-06-24 ENCOUNTER — Encounter: Payer: Self-pay | Admitting: Allergy and Immunology

## 2019-06-24 ENCOUNTER — Ambulatory Visit (INDEPENDENT_AMBULATORY_CARE_PROVIDER_SITE_OTHER): Payer: BC Managed Care – PPO | Admitting: Allergy and Immunology

## 2019-06-24 ENCOUNTER — Other Ambulatory Visit: Payer: Self-pay

## 2019-06-24 VITALS — BP 132/72 | HR 88 | Resp 18 | Ht 64.9 in | Wt 230.0 lb

## 2019-06-24 DIAGNOSIS — J4540 Moderate persistent asthma, uncomplicated: Secondary | ICD-10-CM

## 2019-06-24 DIAGNOSIS — N183 Chronic kidney disease, stage 3 unspecified: Secondary | ICD-10-CM

## 2019-06-24 DIAGNOSIS — J309 Allergic rhinitis, unspecified: Secondary | ICD-10-CM | POA: Diagnosis not present

## 2019-06-24 DIAGNOSIS — K219 Gastro-esophageal reflux disease without esophagitis: Secondary | ICD-10-CM | POA: Diagnosis not present

## 2019-06-24 MED ORDER — FAMOTIDINE 40 MG PO TABS: ORAL_TABLET | ORAL | 5 refills | Status: DC

## 2019-06-24 MED ORDER — IPRATROPIUM BROMIDE 0.06 % NA SOLN: NASAL | 5 refills | Status: DC

## 2019-06-24 NOTE — Progress Notes (Signed)
[place] [facility] [place] [facility] [place]   Follow-up Note  Referring Provider: Marco Collie, MD Primary Provider: Marco Collie, MD Date of Office Visit: 06/24/2019  Subjective:   Laurie Simmons (DOB: May 21, 1966) is a 53 y.o. female who returns to the [facility] on 06/24/2019 in re-evaluation of the following:  HPI: [name] returns to this clinic in evaluation of allergic rhinoconjunctivitis and asthma and reflux.  Her last visit to this clinic was 23 December 2018.  Overall Alma Friendly has done well with her asthma.  It does not sound as though she has required a systemic steroid to treat an exacerbation.  Her requirement for short acting bronchodilator is less than 1 time per month.  She does have continuous runny nose all the time.  Sometimes she gets pressure around her face.  She uses her nasacort and Astelin about 3 times per week at this point.  She tells me that in October 2020 she had a "sinus" episode and received azithromycin for 3 days by her primary care doctor.  Her reflux is intermittently active.  Currently she is using Nexium on a consistent basis and she has lost about 50 pounds over the course of the past year and she does does not consume any caffeine.  She informs me that she has been diagnosed with stage III kidney disease.  She has not seen a nephrologist at this point in time.  As noted above, she is using a proton pump inhibitor twice a day.  As well, she had a problem with menorrhagia that produced dehydration requiring her to receive an fluid infusion in the emergency room/hospital setting 2 weeks ago.  She is scheduled to see GYN in January 2021 to address this issue.  Her immunotherapy is going quite well.  Currently she is receiving pollen and dust mite immunotherapy every 3 weeks and is building up on cat currently at every week administration.  She did receive the flu vaccine this year.  Allergies as of 06/24/2019      Reactions   Prednisone Hives, Other (See Comments)   Mood Swings.   Codeine Anxiety      Medication List    albuterol 108 (90 Base) MCG/ACT inhaler Commonly known as: VENTOLIN HFA Inhale two puffs every 4-6 hours if needed for cough or wheeze   Azelastine HCl 0.15 % Soln Can use one spray in each nostril two times daily if needed.   budesonide-formoterol 160-4.5 MCG/ACT inhaler Commonly known as: Symbicort INHALE 2 PUFFS INTO LUNGS TWICE DAILY TO PREVENT COUGH OF WHEEZE   famotidine 40 MG tablet Commonly known as: PEPCID Take one table twice daily as directed Started by: ERIC Kevan Rosebush, MD   fluticasone 50 MCG/ACT nasal spray Commonly known as: FLONASE   ipratropium 0.06 % nasal spray Commonly known as: ATROVENT Use 2 sprays in each nostril every 6 hours to dry up nose if needed Started by: ERIC Kevan Rosebush, MD   rosuvastatin 5 MG tablet Commonly known as: CRESTOR TK 1 T PO 2 TIMES WEEKLY   topiramate 25 MG tablet Commonly known as: TOPAMAX 2 (two) times daily.   valsartan-hydrochlorothiazide 320-25 MG tablet Commonly known as: DIOVAN-HCT Take 1 tablet by mouth daily.   Viibryd 40 MG Tabs Generic drug: Vilazodone HCl daily.       Past Medical History:  Diagnosis Date  . Asthma   . Carpal tunnel syndrome   . Diabetes ([place])   . GERD (gastroesophageal reflux disease)   .  Severe anemia [id number]    Past Surgical History:  Procedure Laterality Date  . caesarean    . CHOLECYSTECTOMY    . HIP SURGERY Left     Review of systems negative except as noted in HPI / PMHx or noted below:  Review of Systems  Constitutional: Negative.   HENT: Negative.   Eyes: Negative.   Respiratory: Negative.   Cardiovascular: Negative.   Gastrointestinal: Negative.   Genitourinary: Negative.   Musculoskeletal: Negative.   Skin: Negative.   Neurological: Negative.   Endo/Heme/Allergies: Negative.   Psychiatric/Behavioral: Negative.      Objective:   Vitals:   06/24/19 0849  BP:  132/72  Pulse: 88  Resp: 18  SpO2: 100%   Height: 5' 4.9" (164.8 cm)  Weight: 230 lb (104.3 kg)   Physical Exam Constitutional:      Appearance: She is not diaphoretic.  HENT:     Head: Normocephalic.     Right Ear: Tympanic membrane, ear canal and external ear normal.     Left Ear: Tympanic membrane, ear canal and external ear normal.     Nose: Nose normal. No mucosal edema or rhinorrhea.     Mouth/Throat:     Pharynx: Uvula midline. No oropharyngeal exudate.  Eyes:     Conjunctiva/sclera: Conjunctivae normal.  Neck:     Thyroid: No thyromegaly.     Trachea: Trachea normal. No tracheal tenderness or tracheal deviation.  Cardiovascular:     Rate and Rhythm: Normal rate and regular rhythm.     Heart sounds: Normal heart sounds, S1 normal and S2 normal. No murmur.  Pulmonary:     Effort: No respiratory distress.     Breath sounds: Normal breath sounds. No stridor. No wheezing or rales.  Lymphadenopathy:     Head:     Right side of head: No tonsillar adenopathy.     Left side of head: No tonsillar adenopathy.     Cervical: No cervical adenopathy.  Skin:    Findings: No erythema or rash.     Nails: There is no clubbing.  Neurological:     Mental Status: She is alert.     Diagnostics:    Spirometry was performed and demonstrated an FEV1 of 2.71 at 94 % of predicted.    Assessment and Plan:   1. Allergic rhinitis, unspecified seasonality, unspecified trigger   2. Asthma, moderate persistent, well-controlled   3. LPRD (laryngopharyngeal reflux disease)   4. Stage 3 chronic kidney disease, unspecified whether stage 3a or 3b CKD     1. Continue Symbicort 160 2 inhalations 1-2 times per day depending on disease activity  2.  Continue Azelastine 1 spray each nostril 1-2 times per day  depending on disease activity  3. Continue Nasacort - 1 spray each nostril 1-2 times per day depending on disease activity  4.  If needed:   A. Ventolin  B. OTC antihistamime  C.  Nasal saline  D. Ipratropium 0.06% - 2 sprays each nostril every 6 hours to dry nose  5. Continue immunotherapy and EpiPen  6.  Treat reflux with famotidine 40 mg - 1 tablet twice a day to replace Nexium  7.  Further evaluation for kidney disease?  8.  Obtain Covid vaccine when available  9. Return to clinic in 6 months or earlier if problem   Overall Brendia has done okay with her airway on her current therapy which does include a collection of anti-inflammatory agents for her airway and immunotherapy.  She  has some chronic rhinorrhea and we will treat her with nasal ipratropium to be used as needed.  She does have bad reflux and LPR and she also has stage III kidney disease.  Given the association between kidney disease and use of proton pump inhibitors we will try to switch her over to an H2 receptor blocker as noted above.  She will discuss with her primary care doctor about further evaluation of her kidney disease including possible referral to a nephrologist.  I will see her back in this clinic in 6 months or earlier if there is a problem.  Allena Katz, MD Allergy / Immunology [place]

## 2019-06-24 NOTE — Patient Instructions (Addendum)
  1. Continue Symbicort 160 2 inhalations 1-2 times per day depending on disease activity  2.  Continue Azelastine 1 spray each nostril 1-2 times per day  depending on disease activity  3. Continue Nasacort - 1 spray each nostril 1-2 times per day depending on disease activity  4.  If needed:   A. Ventolin  B. OTC antihistamime  C. Nasal saline  D. Ipratropium 0.06% - 2 sprays each nostril every 6 hours to dry nose  5. Continue immunotherapy and EpiPen  6.  Treat reflux with famotidine 40 mg - 1 tablet twice a day to replace Nexium  7.  Further evaluation for kidney disease?  8.  Obtain Covid vaccine when available  9. Return to clinic in 6 months or earlier if problem

## 2019-06-28 ENCOUNTER — Encounter: Payer: Self-pay | Admitting: Allergy and Immunology

## 2019-06-28 DIAGNOSIS — L049 Acute lymphadenitis, unspecified: Secondary | ICD-10-CM | POA: Diagnosis not present

## 2019-06-28 DIAGNOSIS — Z6838 Body mass index (BMI) 38.0-38.9, adult: Secondary | ICD-10-CM | POA: Diagnosis not present

## 2019-06-28 DIAGNOSIS — D509 Iron deficiency anemia, unspecified: Secondary | ICD-10-CM | POA: Diagnosis not present

## 2019-06-28 DIAGNOSIS — R319 Hematuria, unspecified: Secondary | ICD-10-CM | POA: Diagnosis not present

## 2019-07-01 ENCOUNTER — Ambulatory Visit (INDEPENDENT_AMBULATORY_CARE_PROVIDER_SITE_OTHER): Payer: BC Managed Care – PPO

## 2019-07-01 DIAGNOSIS — J309 Allergic rhinitis, unspecified: Secondary | ICD-10-CM

## 2019-07-05 DIAGNOSIS — R2232 Localized swelling, mass and lump, left upper limb: Secondary | ICD-10-CM | POA: Diagnosis not present

## 2019-07-05 DIAGNOSIS — D259 Leiomyoma of uterus, unspecified: Secondary | ICD-10-CM | POA: Diagnosis not present

## 2019-07-05 DIAGNOSIS — D509 Iron deficiency anemia, unspecified: Secondary | ICD-10-CM | POA: Diagnosis not present

## 2019-07-05 DIAGNOSIS — R3129 Other microscopic hematuria: Secondary | ICD-10-CM | POA: Diagnosis not present

## 2019-07-06 DIAGNOSIS — J3081 Allergic rhinitis due to animal (cat) (dog) hair and dander: Secondary | ICD-10-CM | POA: Diagnosis not present

## 2019-07-06 NOTE — Progress Notes (Signed)
CAT VIAL EXP 07-05-20

## 2019-07-14 ENCOUNTER — Ambulatory Visit (INDEPENDENT_AMBULATORY_CARE_PROVIDER_SITE_OTHER): Payer: BC Managed Care – PPO | Admitting: *Deleted

## 2019-07-14 DIAGNOSIS — J309 Allergic rhinitis, unspecified: Secondary | ICD-10-CM

## 2019-07-16 DIAGNOSIS — C50919 Malignant neoplasm of unspecified site of unspecified female breast: Secondary | ICD-10-CM

## 2019-07-16 HISTORY — DX: Malignant neoplasm of unspecified site of unspecified female breast: C50.919

## 2019-07-16 HISTORY — PX: PORT A CATH INJECTION (ARMC HX): HXRAD1731

## 2019-07-18 DIAGNOSIS — J3489 Other specified disorders of nose and nasal sinuses: Secondary | ICD-10-CM | POA: Diagnosis not present

## 2019-07-18 DIAGNOSIS — Z20828 Contact with and (suspected) exposure to other viral communicable diseases: Secondary | ICD-10-CM | POA: Diagnosis not present

## 2019-07-19 ENCOUNTER — Telehealth: Payer: Self-pay | Admitting: Allergy and Immunology

## 2019-07-19 NOTE — Telephone Encounter (Signed)
Informed patient that since everyone in her house hold is positive and symptomatic, that she should hold off on her allergy shots for now and quarantine for approx. 10 days from their onset of symptoms.

## 2019-07-19 NOTE — Telephone Encounter (Signed)
Please inform patient that as long as she remains symptomatic there is no therapy that is required specific for her health care.  Her quarantine interval should be 10 days from her positive test.

## 2019-07-19 NOTE — Telephone Encounter (Signed)
Laurie Simmons called in and states [place], her husband, and son have [place].  She states she is having no symptoms and is wondering if she can get her allergy shot or should she avoid it for now in case she may have COVID.  Please advise.

## 2019-07-22 DIAGNOSIS — D509 Iron deficiency anemia, unspecified: Secondary | ICD-10-CM | POA: Diagnosis not present

## 2019-07-22 DIAGNOSIS — Z20822 Contact with and (suspected) exposure to covid-19: Secondary | ICD-10-CM | POA: Diagnosis not present

## 2019-07-22 DIAGNOSIS — R2232 Localized swelling, mass and lump, left upper limb: Secondary | ICD-10-CM | POA: Diagnosis not present

## 2019-07-30 ENCOUNTER — Ambulatory Visit (INDEPENDENT_AMBULATORY_CARE_PROVIDER_SITE_OTHER): Payer: BC Managed Care – PPO | Admitting: *Deleted

## 2019-07-30 DIAGNOSIS — D509 Iron deficiency anemia, unspecified: Secondary | ICD-10-CM | POA: Diagnosis not present

## 2019-07-30 DIAGNOSIS — J309 Allergic rhinitis, unspecified: Secondary | ICD-10-CM | POA: Diagnosis not present

## 2019-07-30 DIAGNOSIS — B948 Sequelae of other specified infectious and parasitic diseases: Secondary | ICD-10-CM | POA: Diagnosis not present

## 2019-07-30 DIAGNOSIS — N938 Other specified abnormal uterine and vaginal bleeding: Secondary | ICD-10-CM | POA: Diagnosis not present

## 2019-07-30 DIAGNOSIS — R2232 Localized swelling, mass and lump, left upper limb: Secondary | ICD-10-CM | POA: Diagnosis not present

## 2019-08-03 ENCOUNTER — Ambulatory Visit (INDEPENDENT_AMBULATORY_CARE_PROVIDER_SITE_OTHER): Payer: BC Managed Care – PPO | Admitting: Adult Health

## 2019-08-03 ENCOUNTER — Encounter: Payer: Self-pay | Admitting: Adult Health

## 2019-08-03 ENCOUNTER — Other Ambulatory Visit: Payer: Self-pay

## 2019-08-03 DIAGNOSIS — F410 Panic disorder [episodic paroxysmal anxiety] without agoraphobia: Secondary | ICD-10-CM

## 2019-08-03 DIAGNOSIS — F39 Unspecified mood [affective] disorder: Secondary | ICD-10-CM

## 2019-08-03 DIAGNOSIS — F411 Generalized anxiety disorder: Secondary | ICD-10-CM

## 2019-08-03 DIAGNOSIS — F331 Major depressive disorder, recurrent, moderate: Secondary | ICD-10-CM

## 2019-08-03 MED ORDER — VIIBRYD 40 MG PO TABS: 40.0000 mg | ORAL_TABLET | Freq: Every day | ORAL | 5 refills | Status: DC

## 2019-08-03 MED ORDER — CLORAZEPATE DIPOTASSIUM 3.75 MG PO TABS: 3.7500 mg | ORAL_TABLET | Freq: Two times a day (BID) | ORAL | 3 refills | Status: DC | PRN

## 2019-08-03 MED ORDER — TOPIRAMATE 25 MG PO TABS: 25.0000 mg | ORAL_TABLET | Freq: Two times a day (BID) | ORAL | 5 refills | Status: DC

## 2019-08-03 NOTE — Progress Notes (Signed)
Laurie Simmons [id number] Dec 02, 1965 54 y.o.  Subjective:   Patient ID:  Laurie Simmons is a 54 y.o. (DOB August 27, 1965) female.  Chief Complaint:  Chief Complaint  Patient presents with  . Anxiety  . Depression  . Insomnia  . Panic Attack    HPI Laurie Simmons presents to the office today for follow-up of anxiety, panic attacks, depression, and mood disorder.  Describes mood today as "not the best". Pleasant. Mood symptoms - reports depression, anxiety, and irritability. Stating "I'm not doing well". Started feeling bad after Thanksgiving. Has a uterine fibroid. Lost a lot of blood and had to go to the emergency room. Hemoglobin has been low. Has gotten it up to 10. Plans to discuss surgery with OB/GYN. Lost her father in law over Christmas. Thinks he may have had Covid. Her entire family has been diagnosed with Covid - immediate and extended. She tested negative but had all the symptoms her family did. Husband started back to work today. Has disability hearing in February. Has had to talk about a lot of things. Feels like "anxiety" has gotten worse. Has gone back to "not sleeping again". Wakes up at 2 am and can't get back to sleep. Stating "I have a lot on my mind". Feels more emotional "lately". Concerned about a "hormonal imbalance". Varying interest and motivation. Taking medications as prescribed.  Energy levels lower. Active, does not have a regular exercise routine. Retired from school system. Enjoys some usual interests and activities. Married. Lives with husband and their two children - son 10 and daughter 26. Mostly staying home. Appetite adequate. Weight stable. Sleeps well most nights. Averages 5 to 6  hours. Focus and concentration stable. Completing tasks. Managing aspects of household.  Denies SI or HI. Denies AH or VH.  Review of Systems:  Review of Systems  Musculoskeletal: Negative for gait problem.  Neurological: Negative for tremors.  Psychiatric/Behavioral:       Please refer  to HPI    Medications: I have reviewed the patient's current medications.  Current Outpatient Medications  Medication Sig Dispense Refill  . albuterol (VENTOLIN HFA) 108 (90 Base) MCG/ACT inhaler Inhale two puffs every 4-6 hours if needed for cough or wheeze 18 g 1  . Azelastine HCl 0.15 % SOLN Can use one spray in each nostril two times daily if needed. 90 mL 1  . budesonide-formoterol (SYMBICORT) 160-4.5 MCG/ACT inhaler INHALE 2 PUFFS INTO LUNGS TWICE DAILY TO PREVENT COUGH OF WHEEZE 10.2 g 5  . clorazepate (TRANXENE) 3.75 MG tablet Take 1 tablet (3.75 mg total) by mouth 2 (two) times daily as needed for anxiety. 30 tablet 3  . famotidine (PEPCID) 40 MG tablet Take one table twice daily as directed 60 tablet 5  . fluticasone (FLONASE) 50 MCG/ACT nasal spray     . ipratropium (ATROVENT) 0.06 % nasal spray Use 2 sprays in each nostril every 6 hours to dry up nose if needed 15 mL 5  . rosuvastatin (CRESTOR) 5 MG tablet TK 1 T PO 2 TIMES WEEKLY  1  . topiramate (TOPAMAX) 25 MG tablet Take 1 tablet (25 mg total) by mouth 2 (two) times daily. 60 tablet 5  . valsartan-hydrochlorothiazide (DIOVAN-HCT) 320-25 MG tablet Take 1 tablet by mouth daily.  0  . VIIBRYD 40 MG TABS Take 1 tablet (40 mg total) by mouth daily. 30 tablet 5   No current facility-administered medications for this visit.    Medication Side Effects: None  Allergies:  Allergies  Allergen  Reactions  . Prednisone Hives and Other (See Comments)    Mood Swings.  . Codeine Anxiety    Past Medical History:  Diagnosis Date  . Asthma   . Carpal tunnel syndrome   . Diabetes ([place])   . GERD (gastroesophageal reflux disease)   . Severe anemia [id number]    Family History  Problem Relation Age of Onset  . Pulmonary fibrosis Father     Social History   Socioeconomic History  . Marital status: Married    Spouse name: Not on file  . Number of children: Not on file  . Years of education: Not on file  . Highest education  level: Not on file  Occupational History  . Not on file  Tobacco Use  . Smoking status: Never Smoker  . Smokeless tobacco: Never Used  Substance and Sexual Activity  . Alcohol use: No  . Drug use: No  . Sexual activity: Not on file  Other Topics Concern  . Not on file  Social History Narrative  . Not on file   Social Determinants of Health   Financial Resource Strain:   . Difficulty of Paying Living Expenses: Not on file  Food Insecurity:   . Worried About Charity fundraiser in the Last Year: Not on file  . Ran Out of Food in the Last Year: Not on file  Transportation Needs:   . Lack of Transportation (Medical): Not on file  . Lack of Transportation (Non-Medical): Not on file  Physical Activity:   . Days of Exercise per Week: Not on file  . Minutes of Exercise per Session: Not on file  Stress:   . Feeling of Stress : Not on file  Social Connections:   . Frequency of Communication with Friends and Family: Not on file  . Frequency of Social Gatherings with Friends and Family: Not on file  . Attends Religious Services: Not on file  . Active Member of Clubs or Organizations: Not on file  . Attends Archivist Meetings: Not on file  . Marital Status: Not on file  Intimate Partner Violence:   . Fear of Current or Ex-Partner: Not on file  . Emotionally Abused: Not on file  . Physically Abused: Not on file  . Sexually Abused: Not on file    Past Medical History, Surgical history, Social history, and Family history were reviewed and updated as appropriate.   Please see review of systems for further details on the patient's review from today.   Objective:   Physical Exam:  There were no vitals taken for this visit.  Physical Exam Constitutional:      General: She is not in acute distress.    Appearance: She is well-developed.  Musculoskeletal:        General: No deformity.  Neurological:     Mental Status: She is alert and oriented to person, place, and  time.     Coordination: Coordination normal.  Psychiatric:        Attention and Perception: Attention and perception normal. She does not perceive auditory or visual hallucinations.        Mood and Affect: Mood is anxious and depressed. Affect is flat and tearful. Affect is not labile, blunt, angry or inappropriate.        Speech: Speech normal.        Behavior: Behavior normal.        Thought Content: Thought content normal. Thought content is not paranoid or delusional. Thought content does  not include homicidal or suicidal ideation. Thought content does not include homicidal or suicidal plan.        Cognition and Memory: Cognition and memory normal.        Judgment: Judgment normal.     Comments: Insight intact    Lab Review:     Component Value Date/Time   NA 135 10/20/2008 2105   K 4.5 10/20/2008 2105   CL 106 10/20/2008 2105   CO2 22 10/20/2008 2105   GLUCOSE 97 10/21/2008 0545   BUN 16 10/20/2008 2105   CREATININE 0.76 10/20/2008 2105   CALCIUM 9.7 10/20/2008 2105   PROT 6.0 10/20/2008 2105   ALBUMIN 3.1 (L) 10/20/2008 2105   AST 24 10/20/2008 2105   ALT 19 10/20/2008 2105   ALKPHOS 94 10/20/2008 2105   BILITOT 0.2 (L) 10/20/2008 2105   GFRNONAA >60 10/20/2008 2105   GFRAA  10/20/2008 2105    >60        The eGFR has been calculated using the MDRD equation. This calculation has not been validated in all clinical situations. eGFR's persistently <60 mL/min signify possible Chronic Kidney Disease.       Component Value Date/Time   WBC 9.8 12/23/2018 1146   WBC 12.3 (H) 10/22/2008 0530   RBC 5.21 12/23/2018 1146   RBC 3.19 (L) 10/22/2008 0530   HGB 11.6 12/23/2018 1146   HCT 36.4 12/23/2018 1146   PLT 228 10/22/2008 0530   MCV 70 (L) 12/23/2018 1146   MCH 22.3 (L) 12/23/2018 1146   MCHC 31.9 12/23/2018 1146   MCHC 33.7 10/22/2008 0530   RDW 16.9 (H) 12/23/2018 1146   LYMPHSABS 2.8 12/23/2018 1146   EOSABS 0.2 12/23/2018 1146   BASOSABS 0.1 12/23/2018 1146     No results found for: POCLITH, LITHIUM   No results found for: PHENYTOIN, PHENOBARB, VALPROATE, CBMZ   .res Assessment: Plan:    Plan:  1. Continue Viibyrd 11m daily 2. Discontinue Hydroxyzine 161mBID prn anxiety. 3. Continue Topamax 25 mg BID 4. Add Tranxene 3.7552mID  See therapist  RTC 3 months  Patient advised to contact office with any questions, adverse effects, or acute worsening in signs and symptoms.  Discussed potential benefits, risk, and side effects of benzodiazepines to include potential risk of tolerance and dependence, as well as possible drowsiness.  Advised patient not to drive if experiencing drowsiness and to take lowest possible effective dose to minimize risk of dependence and tolerance.  Lor[place]ras seen today for anxiety, depression, insomnia and panic attack.  Diagnoses and all orders for this visit:  Panic attacks -     clorazepate (TRANXENE) 3.75 MG tablet; Take 1 tablet (3.75 mg total) by mouth 2 (two) times daily as needed for anxiety.  Episodic mood disorder (HCC) -     topiramate (TOPAMAX) 25 MG tablet; Take 1 tablet (25 mg total) by mouth 2 (two) times daily.  Generalized anxiety disorder -     clorazepate (TRANXENE) 3.75 MG tablet; Take 1 tablet (3.75 mg total) by mouth 2 (two) times daily as needed for anxiety. -     VIIBRYD 40 MG TABS; Take 1 tablet (40 mg total) by mouth daily.  Major depressive disorder, recurrent episode, moderate (HCC) -     VIIBRYD 40 MG TABS; Take 1 tablet (40 mg total) by mouth daily.     Please see After Visit Summary for patient specific instructions.  Future Appointments  Date Time Provider DepRolfe/19/2021 10:00 AM Emmaclaire Switala,  Berdie Ogren, NP [facility] None  12/23/2019  8:30 AM Kozlow, Donnamarie Poag, MD [facility] None    No orders of the defined types were placed in this encounter.   -------------------------------

## 2019-08-04 ENCOUNTER — Telehealth: Payer: Self-pay

## 2019-08-04 NOTE — Telephone Encounter (Signed)
Left patient a voicemail to call back with her past medications tried for a prior authorization on her Viibryd. We do not have access to her past records and need the information to submit.

## 2019-08-04 NOTE — Telephone Encounter (Signed)
Prior authorization approved for Viibryd 40 mg through [facility] [employer] effective 08/04/2019-08/03/2020  Submitted through cover my meds

## 2019-08-05 ENCOUNTER — Ambulatory Visit (INDEPENDENT_AMBULATORY_CARE_PROVIDER_SITE_OTHER): Payer: BC Managed Care – PPO | Admitting: *Deleted

## 2019-08-05 DIAGNOSIS — J309 Allergic rhinitis, unspecified: Secondary | ICD-10-CM | POA: Diagnosis not present

## 2019-08-09 DIAGNOSIS — D251 Intramural leiomyoma of uterus: Secondary | ICD-10-CM

## 2019-08-09 DIAGNOSIS — D500 Iron deficiency anemia secondary to blood loss (chronic): Secondary | ICD-10-CM | POA: Diagnosis not present

## 2019-08-09 DIAGNOSIS — Z0289 Encounter for other administrative examinations: Secondary | ICD-10-CM

## 2019-08-09 DIAGNOSIS — D219 Benign neoplasm of connective and other soft tissue, unspecified: Secondary | ICD-10-CM | POA: Diagnosis not present

## 2019-08-09 DIAGNOSIS — N921 Excessive and frequent menstruation with irregular cycle: Secondary | ICD-10-CM | POA: Diagnosis not present

## 2019-08-09 HISTORY — DX: Iron deficiency anemia secondary to blood loss (chronic): D50.0

## 2019-08-09 HISTORY — DX: Intramural leiomyoma of uterus: D25.1

## 2019-08-11 ENCOUNTER — Ambulatory Visit (INDEPENDENT_AMBULATORY_CARE_PROVIDER_SITE_OTHER): Payer: BC Managed Care – PPO

## 2019-08-11 DIAGNOSIS — R2232 Localized swelling, mass and lump, left upper limb: Secondary | ICD-10-CM | POA: Diagnosis not present

## 2019-08-11 DIAGNOSIS — J309 Allergic rhinitis, unspecified: Secondary | ICD-10-CM

## 2019-08-11 DIAGNOSIS — R590 Localized enlarged lymph nodes: Secondary | ICD-10-CM | POA: Diagnosis not present

## 2019-08-11 DIAGNOSIS — N6489 Other specified disorders of breast: Secondary | ICD-10-CM | POA: Diagnosis not present

## 2019-08-16 DIAGNOSIS — R590 Localized enlarged lymph nodes: Secondary | ICD-10-CM | POA: Diagnosis not present

## 2019-08-16 DIAGNOSIS — R2232 Localized swelling, mass and lump, left upper limb: Secondary | ICD-10-CM | POA: Diagnosis not present

## 2019-08-16 DIAGNOSIS — C801 Malignant (primary) neoplasm, unspecified: Secondary | ICD-10-CM | POA: Diagnosis not present

## 2019-08-16 DIAGNOSIS — C773 Secondary and unspecified malignant neoplasm of axilla and upper limb lymph nodes: Secondary | ICD-10-CM | POA: Diagnosis not present

## 2019-08-16 DIAGNOSIS — C969 Malignant neoplasm of lymphoid, hematopoietic and related tissue, unspecified: Secondary | ICD-10-CM | POA: Diagnosis not present

## 2019-08-16 DIAGNOSIS — R928 Other abnormal and inconclusive findings on diagnostic imaging of breast: Secondary | ICD-10-CM | POA: Diagnosis not present

## 2019-08-16 DIAGNOSIS — C761 Malignant neoplasm of thorax: Secondary | ICD-10-CM | POA: Diagnosis not present

## 2019-08-23 ENCOUNTER — Ambulatory Visit (INDEPENDENT_AMBULATORY_CARE_PROVIDER_SITE_OTHER): Payer: BC Managed Care – PPO | Admitting: *Deleted

## 2019-08-23 DIAGNOSIS — Z6837 Body mass index (BMI) 37.0-37.9, adult: Secondary | ICD-10-CM | POA: Diagnosis not present

## 2019-08-23 DIAGNOSIS — J309 Allergic rhinitis, unspecified: Secondary | ICD-10-CM

## 2019-08-23 DIAGNOSIS — C773 Secondary and unspecified malignant neoplasm of axilla and upper limb lymph nodes: Secondary | ICD-10-CM | POA: Diagnosis not present

## 2019-08-23 DIAGNOSIS — Z6841 Body Mass Index (BMI) 40.0 and over, adult: Secondary | ICD-10-CM

## 2019-08-23 HISTORY — DX: Secondary and unspecified malignant neoplasm of axilla and upper limb lymph nodes: C77.3

## 2019-08-23 HISTORY — DX: Body Mass Index (BMI) 40.0 and over, adult: Z684

## 2019-09-01 ENCOUNTER — Ambulatory Visit (INDEPENDENT_AMBULATORY_CARE_PROVIDER_SITE_OTHER): Payer: BC Managed Care – PPO | Admitting: *Deleted

## 2019-09-01 DIAGNOSIS — J309 Allergic rhinitis, unspecified: Secondary | ICD-10-CM | POA: Diagnosis not present

## 2019-09-06 ENCOUNTER — Other Ambulatory Visit: Payer: Self-pay | Admitting: Family Medicine

## 2019-09-06 DIAGNOSIS — R2232 Localized swelling, mass and lump, left upper limb: Secondary | ICD-10-CM

## 2019-09-09 ENCOUNTER — Ambulatory Visit (INDEPENDENT_AMBULATORY_CARE_PROVIDER_SITE_OTHER): Payer: BC Managed Care – PPO | Admitting: *Deleted

## 2019-09-09 DIAGNOSIS — N182 Chronic kidney disease, stage 2 (mild): Secondary | ICD-10-CM | POA: Diagnosis not present

## 2019-09-09 DIAGNOSIS — E785 Hyperlipidemia, unspecified: Secondary | ICD-10-CM | POA: Diagnosis not present

## 2019-09-09 DIAGNOSIS — J309 Allergic rhinitis, unspecified: Secondary | ICD-10-CM

## 2019-09-09 DIAGNOSIS — C779 Secondary and unspecified malignant neoplasm of lymph node, unspecified: Secondary | ICD-10-CM | POA: Diagnosis not present

## 2019-09-09 DIAGNOSIS — E1169 Type 2 diabetes mellitus with other specified complication: Secondary | ICD-10-CM | POA: Diagnosis not present

## 2019-09-14 ENCOUNTER — Telehealth: Payer: Self-pay | Admitting: Adult Health

## 2019-09-14 NOTE — Telephone Encounter (Signed)
Put her on cancellation list for Laurie Simmons  It is okay for the patient to temporarily increase the clorazepate 3.75 mg tablets to 4 daily as long as it is not too sedating.  She will need to discuss with her provider whether this is acceptable as a longer term solution.

## 2019-09-14 NOTE — Telephone Encounter (Signed)
Patient called and said that to increase the dosage of her medicine.She has recently been diagnosed with breast cancer and her anxiety is high. Please give her a call at [phone]

## 2019-09-15 ENCOUNTER — Other Ambulatory Visit: Payer: Self-pay | Admitting: Adult Health

## 2019-09-15 ENCOUNTER — Ambulatory Visit (INDEPENDENT_AMBULATORY_CARE_PROVIDER_SITE_OTHER): Payer: BC Managed Care – PPO | Admitting: *Deleted

## 2019-09-15 DIAGNOSIS — F411 Generalized anxiety disorder: Secondary | ICD-10-CM

## 2019-09-15 DIAGNOSIS — F410 Panic disorder [episodic paroxysmal anxiety] without agoraphobia: Secondary | ICD-10-CM

## 2019-09-15 DIAGNOSIS — J309 Allergic rhinitis, unspecified: Secondary | ICD-10-CM | POA: Diagnosis not present

## 2019-09-15 DIAGNOSIS — L821 Other seborrheic keratosis: Secondary | ICD-10-CM | POA: Diagnosis not present

## 2019-09-15 DIAGNOSIS — L578 Other skin changes due to chronic exposure to nonionizing radiation: Secondary | ICD-10-CM | POA: Diagnosis not present

## 2019-09-15 MED ORDER — CLORAZEPATE DIPOTASSIUM 7.5 MG PO TABS: 7.5000 mg | ORAL_TABLET | Freq: Two times a day (BID) | ORAL | 2 refills | Status: DC | PRN

## 2019-09-15 NOTE — Telephone Encounter (Signed)
Tranxene increased to 7.5mg  bid and script sent - pls notify patient.

## 2019-09-15 NOTE — Telephone Encounter (Signed)
Patient notified

## 2019-09-16 ENCOUNTER — Ambulatory Visit
Admission: RE | Admit: 2019-09-16 | Discharge: 2019-09-16 | Disposition: A | Discharge status: Home / Self Care | Payer: BC Managed Care – PPO | Source: Ambulatory Visit | Attending: Family Medicine | Admitting: Family Medicine

## 2019-09-16 ENCOUNTER — Other Ambulatory Visit: Payer: Self-pay

## 2019-09-16 DIAGNOSIS — C801 Malignant (primary) neoplasm, unspecified: Secondary | ICD-10-CM | POA: Diagnosis not present

## 2019-09-16 DIAGNOSIS — C773 Secondary and unspecified malignant neoplasm of axilla and upper limb lymph nodes: Secondary | ICD-10-CM | POA: Diagnosis not present

## 2019-09-16 DIAGNOSIS — R2232 Localized swelling, mass and lump, left upper limb: Secondary | ICD-10-CM

## 2019-09-16 MED ORDER — GADOBUTROL 1 MMOL/ML IV SOLN
10.0000 mL | Freq: Once | INTRAVENOUS | Status: AC | PRN
  Administered 2019-09-16: 10 mL via INTRAVENOUS

## 2019-09-17 DIAGNOSIS — C773 Secondary and unspecified malignant neoplasm of axilla and upper limb lymph nodes: Secondary | ICD-10-CM | POA: Diagnosis not present

## 2019-09-17 DIAGNOSIS — C801 Malignant (primary) neoplasm, unspecified: Secondary | ICD-10-CM | POA: Diagnosis not present

## 2019-09-17 DIAGNOSIS — D500 Iron deficiency anemia secondary to blood loss (chronic): Secondary | ICD-10-CM | POA: Diagnosis not present

## 2019-09-24 DIAGNOSIS — X58XXXA Exposure to other specified factors, initial encounter: Secondary | ICD-10-CM | POA: Diagnosis not present

## 2019-09-24 DIAGNOSIS — C773 Secondary and unspecified malignant neoplasm of axilla and upper limb lymph nodes: Secondary | ICD-10-CM | POA: Diagnosis not present

## 2019-09-24 DIAGNOSIS — C50919 Malignant neoplasm of unspecified site of unspecified female breast: Secondary | ICD-10-CM | POA: Diagnosis not present

## 2019-09-24 DIAGNOSIS — T451X1A Poisoning by antineoplastic and immunosuppressive drugs, accidental (unintentional), initial encounter: Secondary | ICD-10-CM | POA: Diagnosis not present

## 2019-09-28 ENCOUNTER — Ambulatory Visit (INDEPENDENT_AMBULATORY_CARE_PROVIDER_SITE_OTHER): Payer: BC Managed Care – PPO | Admitting: *Deleted

## 2019-09-28 DIAGNOSIS — J309 Allergic rhinitis, unspecified: Secondary | ICD-10-CM

## 2019-09-30 DIAGNOSIS — Z6837 Body mass index (BMI) 37.0-37.9, adult: Secondary | ICD-10-CM | POA: Diagnosis not present

## 2019-09-30 DIAGNOSIS — C773 Secondary and unspecified malignant neoplasm of axilla and upper limb lymph nodes: Secondary | ICD-10-CM | POA: Diagnosis not present

## 2019-10-01 DIAGNOSIS — E1122 Type 2 diabetes mellitus with diabetic chronic kidney disease: Secondary | ICD-10-CM | POA: Diagnosis not present

## 2019-10-01 DIAGNOSIS — J45909 Unspecified asthma, uncomplicated: Secondary | ICD-10-CM | POA: Diagnosis not present

## 2019-10-01 DIAGNOSIS — Z452 Encounter for adjustment and management of vascular access device: Secondary | ICD-10-CM | POA: Diagnosis not present

## 2019-10-01 DIAGNOSIS — C773 Secondary and unspecified malignant neoplasm of axilla and upper limb lymph nodes: Secondary | ICD-10-CM | POA: Diagnosis not present

## 2019-10-01 DIAGNOSIS — Z79899 Other long term (current) drug therapy: Secondary | ICD-10-CM | POA: Diagnosis not present

## 2019-10-01 DIAGNOSIS — K219 Gastro-esophageal reflux disease without esophagitis: Secondary | ICD-10-CM | POA: Diagnosis not present

## 2019-10-01 DIAGNOSIS — C801 Malignant (primary) neoplasm, unspecified: Secondary | ICD-10-CM | POA: Diagnosis not present

## 2019-10-01 DIAGNOSIS — N183 Chronic kidney disease, stage 3 unspecified: Secondary | ICD-10-CM | POA: Diagnosis not present

## 2019-10-01 DIAGNOSIS — C50919 Malignant neoplasm of unspecified site of unspecified female breast: Secondary | ICD-10-CM | POA: Diagnosis not present

## 2019-10-01 DIAGNOSIS — I129 Hypertensive chronic kidney disease with stage 1 through stage 4 chronic kidney disease, or unspecified chronic kidney disease: Secondary | ICD-10-CM | POA: Diagnosis not present

## 2019-10-01 DIAGNOSIS — K589 Irritable bowel syndrome without diarrhea: Secondary | ICD-10-CM | POA: Diagnosis not present

## 2019-10-01 DIAGNOSIS — Z862 Personal history of diseases of the blood and blood-forming organs and certain disorders involving the immune mechanism: Secondary | ICD-10-CM | POA: Diagnosis not present

## 2019-10-05 DIAGNOSIS — Z09 Encounter for follow-up examination after completed treatment for conditions other than malignant neoplasm: Secondary | ICD-10-CM | POA: Insufficient documentation

## 2019-10-06 DIAGNOSIS — J3081 Allergic rhinitis due to animal (cat) (dog) hair and dander: Secondary | ICD-10-CM

## 2019-10-06 NOTE — Progress Notes (Signed)
VIAL EXP 10-05-20

## 2019-10-12 DIAGNOSIS — C773 Secondary and unspecified malignant neoplasm of axilla and upper limb lymph nodes: Secondary | ICD-10-CM | POA: Diagnosis not present

## 2019-10-12 DIAGNOSIS — Z5111 Encounter for antineoplastic chemotherapy: Secondary | ICD-10-CM | POA: Diagnosis not present

## 2019-10-12 DIAGNOSIS — D259 Leiomyoma of uterus, unspecified: Secondary | ICD-10-CM | POA: Diagnosis not present

## 2019-10-12 DIAGNOSIS — C50912 Malignant neoplasm of unspecified site of left female breast: Secondary | ICD-10-CM | POA: Diagnosis not present

## 2019-10-20 ENCOUNTER — Ambulatory Visit (INDEPENDENT_AMBULATORY_CARE_PROVIDER_SITE_OTHER): Payer: BC Managed Care – PPO | Admitting: *Deleted

## 2019-10-20 DIAGNOSIS — J309 Allergic rhinitis, unspecified: Secondary | ICD-10-CM

## 2019-10-28 ENCOUNTER — Other Ambulatory Visit: Payer: Self-pay | Admitting: Allergy and Immunology

## 2019-10-28 ENCOUNTER — Ambulatory Visit (INDEPENDENT_AMBULATORY_CARE_PROVIDER_SITE_OTHER): Payer: BC Managed Care – PPO | Admitting: *Deleted

## 2019-10-28 DIAGNOSIS — J309 Allergic rhinitis, unspecified: Secondary | ICD-10-CM

## 2019-11-01 ENCOUNTER — Ambulatory Visit: Payer: BC Managed Care – PPO | Admitting: Adult Health

## 2019-11-02 DIAGNOSIS — C773 Secondary and unspecified malignant neoplasm of axilla and upper limb lymph nodes: Secondary | ICD-10-CM

## 2019-11-02 DIAGNOSIS — D500 Iron deficiency anemia secondary to blood loss (chronic): Secondary | ICD-10-CM | POA: Diagnosis not present

## 2019-11-02 DIAGNOSIS — C50912 Malignant neoplasm of unspecified site of left female breast: Secondary | ICD-10-CM

## 2019-11-08 ENCOUNTER — Ambulatory Visit (INDEPENDENT_AMBULATORY_CARE_PROVIDER_SITE_OTHER): Payer: BC Managed Care – PPO | Admitting: Adult Health

## 2019-11-08 ENCOUNTER — Encounter: Payer: Self-pay | Admitting: Adult Health

## 2019-11-08 DIAGNOSIS — F410 Panic disorder [episodic paroxysmal anxiety] without agoraphobia: Secondary | ICD-10-CM | POA: Diagnosis not present

## 2019-11-08 DIAGNOSIS — F411 Generalized anxiety disorder: Secondary | ICD-10-CM

## 2019-11-08 DIAGNOSIS — F331 Major depressive disorder, recurrent, moderate: Secondary | ICD-10-CM

## 2019-11-08 DIAGNOSIS — F39 Unspecified mood [affective] disorder: Secondary | ICD-10-CM

## 2019-11-08 MED ORDER — CLORAZEPATE DIPOTASSIUM 3.75 MG PO TABS: ORAL_TABLET | ORAL | 2 refills | Status: DC

## 2019-11-08 MED ORDER — VIIBRYD 40 MG PO TABS: 40.0000 mg | ORAL_TABLET | Freq: Every day | ORAL | 5 refills | Status: DC

## 2019-11-08 MED ORDER — TOPIRAMATE 50 MG PO TABS: 25.0000 mg | ORAL_TABLET | Freq: Every day | ORAL | 5 refills | Status: DC

## 2019-11-08 NOTE — Progress Notes (Signed)
Laurie Simmons [id number] Feb 17, 1966 54 y.o.  Virtual Visit via Telephone Note  I connected with pt on 11/08/19 at 10:00 AM EDT by telephone and verified that I am speaking with the correct person using two identifiers.   I discussed the limitations, risks, security and privacy concerns of performing an evaluation and management service by telephone and the availability of in person appointments. I also discussed with the patient that there may be a patient responsible charge related to this service. The patient expressed understanding and agreed to proceed.   I discussed the assessment and treatment plan with the patient. The patient was provided an opportunity to ask questions and all were answered. The patient agreed with the plan and demonstrated an understanding of the instructions.   The patient was advised to call back or seek an in-person evaluation if the symptoms worsen or if the condition fails to improve as anticipated.  I provided 30 minutes of non-face-to-face time during this encounter.  The patient was located at home.  The provider was located at [place].   Aloha Gell, NP   Subjective:   Patient ID:  Laurie Simmons is a 54 y.o. (DOB [birth year]) female.  Chief Complaint: No chief complaint on file.   HPI Teja Costen Harvill presents for follow-up of anxiety, panic attacks, depression, and mood disorder.  Describes mood today as "ok". Pleasant. Mood symptoms - reports depression, anxiety, and irritability - "it comes and goes". Stating "it's been really traumatic, but I'm doing better". Stating "it's difficult and emotional".  Diagnosed with stage 2 breast cancer in February. Undergoing chemotherapy currently. Has upcoming mastectomy and radiation. Stating "I'm trying to stay strong". Doesn't feel like she can talk to her family about how she feels. Stating "I don't think they like to hear about it". Has done a lot of "research"  and feels like she is "handling it better". Varying interest and motivation. Taking medications as prescribed.  Energy levels lower. Fatigued from chemotherapy. Has a fibroid and is "losing blood". Had a hysterectomy planned prior to breast cancer diagnosis. Active, does not have a regular exercise routine.  Enjoys some usual interests and activities. Married. Lives with husband and their two children - son 38 and daughter 35. Mostly staying home with cancer treatment. Appetite adequate. Weight loss 10 pounds. Sleeps well most nights. Averages 6 to 7 hours. Some daytime napping.  Focus and concentration "really bad". Completing tasks. Managing aspects of household - "little things".  Denies SI or HI. Denies AH or VH.  Review of Systems:  Review of Systems  Musculoskeletal: Negative for gait problem.  Neurological: Negative for tremors.  Psychiatric/Behavioral:       Please refer to HPI    Medications: I have reviewed the patient's current medications.  Current Outpatient Medications  Medication Sig Dispense Refill  . Blood Glucose Monitoring Suppl (ONE TOUCH ULTRA 2) w/Device KIT USE TO CHECK BLOOD SUGAR DAILY    . glucose blood (ONETOUCH ULTRA) test strip USE TO CHECK BLOOD SUGAR DAILY    . albuterol (VENTOLIN HFA) 108 (90 Base) MCG/ACT inhaler Inhale two puffs every 4-6 hours if needed for cough or wheeze 18 g 1  . Azelastine HCl 0.15 % SOLN Can use one spray in each nostril two times daily if needed. 90 mL 1  . clorazepate (TRANXENE) 3.75 MG tablet Take one tablet up to four times daily as needed for anxiety/panic attacks. 120 tablet 2  . dexamethasone (DECADRON) 4  MG tablet TAKE 1 TABLET BY MOUTH TWICE DAILY THE DAY BEFORE CHEMO. TAKE 2 TABLETS TWICE DAILY STARTING THE DAY AFTER CHEMO FOR 3 DAYS.    . famotidine (PEPCID) 40 MG tablet Take one table twice daily as directed 60 tablet 5  . fluticasone (FLONASE) 50 MCG/ACT nasal spray     . folic acid (FOLVITE) 1 MG tablet Take 1 mg by  mouth daily.    . hydrOXYzine (ATARAX/VISTARIL) 10 MG tablet Take 10-20 mg by mouth 2 (two) times daily as needed.    Marland Kitchen ipratropium (ATROVENT) 0.06 % nasal spray Use 2 sprays in each nostril every 6 hours to dry up nose if needed 15 mL 5  . lidocaine (XYLOCAINE) 2 % solution     . loratadine (CLARITIN) 10 MG tablet Take 10 mg by mouth daily.    . medroxyPROGESTERone (PROVERA) 5 MG tablet Take 10 mg by mouth at bedtime.    . ondansetron (ZOFRAN) 4 MG tablet Take 4 mg by mouth every 4 (four) hours as needed.    . ondansetron (ZOFRAN-ODT) 4 MG disintegrating tablet Take 4 mg by mouth every 6 (six) hours as needed.    . rosuvastatin (CRESTOR) 5 MG tablet TK 1 T PO 2 TIMES WEEKLY  1  . SYMBICORT 160-4.5 MCG/ACT inhaler INHALE 2 PUFFS BY MOUTH TWICE DAILY TO PREVENT COUGH OR WHEEZE. RINSE, GARGLE AND SPIT AFTER USE 10.2 g 1  . topiramate (TOPAMAX) 50 MG tablet Take 0.5 tablets (25 mg total) by mouth daily. 30 tablet 5  . valsartan-hydrochlorothiazide (DIOVAN-HCT) 320-25 MG tablet Take 1 tablet by mouth daily.  0  . VIIBRYD 40 MG TABS Take 1 tablet (40 mg total) by mouth daily. 30 tablet 5   No current facility-administered medications for this visit.    Medication Side Effects: None  Allergies:  Allergies  Allergen Reactions  . Prednisone Hives and Other (See Comments)    Mood Swings.  . Codeine Anxiety    Past Medical History:  Diagnosis Date  . Asthma   . Carpal tunnel syndrome   . Diabetes ([place])   . GERD (gastroesophageal reflux disease)   . Severe anemia [id number]    Family History  Problem Relation Age of Onset  . Pulmonary fibrosis Father     Social History   Socioeconomic History  . Marital status: Married    Spouse name: Not on file  . Number of children: Not on file  . Years of education: Not on file  . Highest education level: Not on file  Occupational History  . Not on file  Tobacco Use  . Smoking status: Never Smoker  . Smokeless tobacco: Never Used  Substance  and Sexual Activity  . Alcohol use: No  . Drug use: No  . Sexual activity: Not on file  Other Topics Concern  . Not on file  Social History Narrative  . Not on file   Social Determinants of Health   Financial Resource Strain:   . Difficulty of Paying Living Expenses:   Food Insecurity:   . Worried About Charity fundraiser in the Last Year:   . Arboriculturist in the Last Year:   Transportation Needs:   . Film/video editor (Medical):   Marland Kitchen Lack of Transportation (Non-Medical):   Physical Activity:   . Days of Exercise per Week:   . Minutes of Exercise per Session:   Stress:   . Feeling of Stress :   Social Connections:   .  Frequency of Communication with Friends and Family:   . Frequency of Social Gatherings with Friends and Family:   . Attends Religious Services:   . Active Member of Clubs or Organizations:   . Attends Archivist Meetings:   Marland Kitchen Marital Status:   Intimate Partner Violence:   . Fear of Current or Ex-Partner:   . Emotionally Abused:   Marland Kitchen Physically Abused:   . Sexually Abused:     Past Medical History, Surgical history, Social history, and Family history were reviewed and updated as appropriate.   Please see review of systems for further details on the patient's review from today.   Objective:   Physical Exam:  There were no vitals taken for this visit.  Physical Exam Neurological:     Mental Status: She is alert and oriented to person, place, and time.     Cranial Nerves: No dysarthria.  Psychiatric:        Attention and Perception: Attention and perception normal.        Mood and Affect: Mood is anxious and depressed.        Speech: Speech normal.        Behavior: Behavior is cooperative.        Thought Content: Thought content normal. Thought content is not paranoid or delusional. Thought content does not include homicidal or suicidal ideation. Thought content does not include homicidal or suicidal plan.        Cognition and  Memory: Cognition and memory normal.        Judgment: Judgment normal.     Comments: Insight intact     Lab Review:     Component Value Date/Time   NA 135 10/20/2008 2105   K 4.5 10/20/2008 2105   CL 106 10/20/2008 2105   CO2 22 10/20/2008 2105   GLUCOSE 97 10/21/2008 0545   BUN 16 10/20/2008 2105   CREATININE 0.76 10/20/2008 2105   CALCIUM 9.7 10/20/2008 2105   PROT 6.0 10/20/2008 2105   ALBUMIN 3.1 (L) 10/20/2008 2105   AST 24 10/20/2008 2105   ALT 19 10/20/2008 2105   ALKPHOS 94 10/20/2008 2105   BILITOT 0.2 (L) 10/20/2008 2105   GFRNONAA >60 10/20/2008 2105   GFRAA  10/20/2008 2105    >60        The eGFR has been calculated using the MDRD equation. This calculation has not been validated in all clinical situations. eGFR's persistently <60 mL/min signify possible Chronic Kidney Disease.       Component Value Date/Time   WBC 9.8 12/23/2018 1146   WBC 12.3 (H) 10/22/2008 0530   RBC 5.21 12/23/2018 1146   RBC 3.19 (L) 10/22/2008 0530   HGB 11.6 12/23/2018 1146   HCT 36.4 12/23/2018 1146   PLT 228 10/22/2008 0530   MCV 70 (L) 12/23/2018 1146   MCH 22.3 (L) 12/23/2018 1146   MCHC 31.9 12/23/2018 1146   MCHC 33.7 10/22/2008 0530   RDW 16.9 (H) 12/23/2018 1146   LYMPHSABS 2.8 12/23/2018 1146   EOSABS 0.2 12/23/2018 1146   BASOSABS 0.1 12/23/2018 1146    No results found for: POCLITH, LITHIUM   No results found for: PHENYTOIN, PHENOBARB, VALPROATE, CBMZ   .res Assessment: Plan:    Plan:  1. Viibyrd 70m daily 2. Tranxene 3.774mup to 4 times daily 3. Topamax 25 mg BID  See therapist  RTC 3 months  Patient advised to contact office with any questions, adverse effects, or acute worsening in signs and symptoms.  Discussed potential benefits, risk, and side effects of benzodiazepines to include potential risk of tolerance and dependence, as well as possible drowsiness.  Advised patient not to drive if experiencing drowsiness and to take lowest possible  effective dose to minimize risk of dependence and tolerance.   Diagnoses and all orders for this visit:  Major depressive disorder, recurrent episode, moderate (HCC) -     VIIBRYD 40 MG TABS; Take 1 tablet (40 mg total) by mouth daily.  Episodic mood disorder (HCC) -     topiramate (TOPAMAX) 50 MG tablet; Take 0.5 tablets (25 mg total) by mouth daily.  Generalized anxiety disorder -     clorazepate (TRANXENE) 3.75 MG tablet; Take one tablet up to four times daily as needed for anxiety/panic attacks. -     VIIBRYD 40 MG TABS; Take 1 tablet (40 mg total) by mouth daily.  Panic attacks -     clorazepate (TRANXENE) 3.75 MG tablet; Take one tablet up to four times daily as needed for anxiety/panic attacks.    Please see After Visit Summary for patient specific instructions.  Future Appointments  Date Time Provider [place]  12/23/2019  8:30 AM Kozlow, Donnamarie Poag, MD [facility] None    No orders of the defined types were placed in this encounter.     -------------------------------

## 2019-11-11 ENCOUNTER — Ambulatory Visit (INDEPENDENT_AMBULATORY_CARE_PROVIDER_SITE_OTHER): Payer: BC Managed Care – PPO

## 2019-11-11 DIAGNOSIS — J309 Allergic rhinitis, unspecified: Secondary | ICD-10-CM

## 2019-11-11 MED ORDER — MUPIROCIN 2 % EX OINT: 1.0000 "application " | TOPICAL_OINTMENT | Freq: Three times a day (TID) | CUTANEOUS | 0 refills | Status: AC

## 2019-11-15 ENCOUNTER — Ambulatory Visit (INDEPENDENT_AMBULATORY_CARE_PROVIDER_SITE_OTHER): Payer: BC Managed Care – PPO | Admitting: *Deleted

## 2019-11-15 DIAGNOSIS — J309 Allergic rhinitis, unspecified: Secondary | ICD-10-CM | POA: Diagnosis not present

## 2019-11-30 ENCOUNTER — Ambulatory Visit (INDEPENDENT_AMBULATORY_CARE_PROVIDER_SITE_OTHER): Payer: BC Managed Care – PPO | Admitting: *Deleted

## 2019-11-30 DIAGNOSIS — J309 Allergic rhinitis, unspecified: Secondary | ICD-10-CM

## 2019-12-06 ENCOUNTER — Ambulatory Visit (INDEPENDENT_AMBULATORY_CARE_PROVIDER_SITE_OTHER): Payer: BC Managed Care – PPO | Admitting: *Deleted

## 2019-12-06 DIAGNOSIS — J309 Allergic rhinitis, unspecified: Secondary | ICD-10-CM

## 2019-12-16 ENCOUNTER — Telehealth: Payer: Self-pay | Admitting: Allergy and Immunology

## 2019-12-16 ENCOUNTER — Other Ambulatory Visit: Payer: Self-pay | Admitting: Allergy and Immunology

## 2019-12-16 NOTE — Telephone Encounter (Signed)
Laurie Simmons called in and states she has an acid reflux attack and it has had it since Sunday.  Laurie Simmons states she has taken all the medications as prescribed and it is still not helping.  Laurie Simmons is wondering if you have any suggestions on what she should do next?  Please advise.

## 2019-12-16 NOTE — Telephone Encounter (Signed)
Patient informed. 

## 2019-12-16 NOTE — Telephone Encounter (Signed)
Please inform patient that she may need to go back on Nexium 40 mg/day while continuing on her famotidine.  Given her history of kidney issues she may need to follow-up with a nephrologist concerning further evaluation of her abnormal kidney function especially in the setting of using a proton pump inhibitor on a daily basis.

## 2019-12-23 ENCOUNTER — Encounter: Payer: Self-pay | Admitting: Allergy and Immunology

## 2019-12-23 ENCOUNTER — Ambulatory Visit (INDEPENDENT_AMBULATORY_CARE_PROVIDER_SITE_OTHER): Payer: BC Managed Care – PPO | Admitting: Allergy and Immunology

## 2019-12-23 ENCOUNTER — Encounter (INDEPENDENT_AMBULATORY_CARE_PROVIDER_SITE_OTHER): Payer: Self-pay

## 2019-12-23 ENCOUNTER — Other Ambulatory Visit: Payer: Self-pay

## 2019-12-23 VITALS — BP 134/100 | HR 116 | Resp 19 | Ht 64.7 in | Wt 233.0 lb

## 2019-12-23 DIAGNOSIS — N183 Chronic kidney disease, stage 3 unspecified: Secondary | ICD-10-CM

## 2019-12-23 DIAGNOSIS — J309 Allergic rhinitis, unspecified: Secondary | ICD-10-CM | POA: Diagnosis not present

## 2019-12-23 DIAGNOSIS — K219 Gastro-esophageal reflux disease without esophagitis: Secondary | ICD-10-CM

## 2019-12-23 DIAGNOSIS — J4540 Moderate persistent asthma, uncomplicated: Secondary | ICD-10-CM | POA: Diagnosis not present

## 2019-12-23 MED ORDER — FAMOTIDINE 40 MG PO TABS: ORAL_TABLET | ORAL | 5 refills | Status: DC

## 2019-12-23 NOTE — Progress Notes (Signed)
[facility] [place] [facility] [place]   Follow-up Note  Referring Provider: Marco Collie, MD Primary Provider: Marco Collie, MD Date of Office Visit: 12/23/2019  Subjective:   Laurie Simmons (DOB: August 15, 1965) is a 54 y.o. female who returns to the [facility] on 12/23/2019 in re-evaluation of the following:  HPI: Laurie Simmons returns to this clinic in reevaluation of allergic rhinoconjunctivitis and asthma and reflux.  Her last visit to this clinic was 24 June 2019.  Since I have seen her in this clinic she has been diagnosed with metastatic breast cancer affecting her left axilla for which she is undergoing chemotherapy associated with intermittent use of dexamethasone with apparent good response to be followed by surgery and radiation.  Her asthma has been under excellent control using Symbicort 1 time per day and she has not had a need to use a short acting bronchodilator or administration of systemic steroids to treat a exacerbation.  Her nose still has some intermittent running and some intermittent congestion.  She had epistaxis with the use of her nasal sprays and she discontinued these agents and apparently received a Z-Pak from her primary care doctor to address this issue as well.  Fortunately, all of that epistaxis has resolved.  Nasal ipratropium really did not help her rhinorrhea.  Her reflux is intermittently active since we changed her from Nexium to famotidine secondary to her kidney dysfunction.  She has intermittent attacks of burning up into her throat that can sometimes last last hours and sometimes last days.  She has limited all caffeine consumption.  Presently her reflux is under pretty good control.  She continues on immunotherapy directed against aeroallergens and cat without adverse effect.  She has received 2 Covid vaccinations.  Allergies as of 12/23/2019      Reactions   Prednisone Hives, Other (See Comments)   Mood Swings.    Codeine Anxiety      Medication List      albuterol 108 (90 Base) MCG/ACT inhaler Commonly known as: VENTOLIN HFA Inhale two puffs every 4-6 hours if needed for cough or wheeze   Azelastine HCl 0.15 % Soln Can use one spray in each nostril two times daily if needed.   clorazepate 3.75 MG tablet Commonly known as: TRANXENE Take one tablet up to four times daily as needed for anxiety/panic attacks.   dexamethasone 4 MG tablet Commonly known as: DECADRON TAKE 1 TABLET BY MOUTH TWICE DAILY THE DAY BEFORE CHEMO. TAKE 2 TABLETS TWICE DAILY STARTING THE DAY AFTER CHEMO FOR 3 DAYS.   docusate sodium 50 MG capsule Commonly known as: COLACE Take 50 mg by mouth 2 (two) times daily.   famotidine 40 MG tablet Commonly known as: PEPCID TAKE 1 TABLET BY MOUTH TWICE DAILY AS DIRECTED   ferrous sulfate 324 MG Tbec Take 65 mg by mouth. Once daily   fluticasone 50 MCG/ACT nasal spray Commonly known as: FLONASE   folic acid 1 MG tablet Commonly known as: FOLVITE Take 1 mg by mouth daily.   hydrOXYzine 10 MG tablet Commonly known as: ATARAX/VISTARIL Take 10-20 mg by mouth 2 (two) times daily as needed.   ipratropium 0.06 % nasal spray Commonly known as: ATROVENT Use 2 sprays in each nostril every 6 hours to dry up nose if needed   lidocaine 2 % solution Commonly known as: XYLOCAINE   loratadine 10 MG tablet Commonly known as: CLARITIN Take 10 mg by mouth daily.   medroxyPROGESTERone 5 MG  tablet Commonly known as: PROVERA Take 10 mg by mouth at bedtime.   mupirocin ointment 2 % Commonly known as: BACTROBAN Place 1 application into the nose 2 (two) times daily. Apply in each nostril 3 times daily   ondansetron 4 MG disintegrating tablet Commonly known as: ZOFRAN-ODT Take 4 mg by mouth every 6 (six) hours as needed.   ondansetron 4 MG tablet Commonly known as: ZOFRAN Take 4 mg by mouth every 4 (four) hours as needed.   ONE TOUCH ULTRA 2 w/Device Kit USE TO CHECK BLOOD  SUGAR DAILY   OneTouch Ultra test strip Generic drug: glucose blood USE TO CHECK BLOOD SUGAR DAILY   Ozempic (0.25 or 0.5 MG/DOSE) 2 MG/1.5ML Sopn Generic drug: Semaglutide(0.25 or 0.5MG/DOS) Inject 0.5 mg into the skin. Once weekly   potassium chloride 10 MEQ tablet Commonly known as: KLOR-CON Take 10 mEq by mouth daily.   prochlorperazine 10 MG tablet Commonly known as: COMPAZINE Take 10 mg by mouth every 6 (six) hours as needed for nausea or vomiting.   rosuvastatin 5 MG tablet Commonly known as: CRESTOR TK 1 T PO 2 TIMES WEEKLY   Symbicort 160-4.5 MCG/ACT inhaler Generic drug: budesonide-formoterol INHALE 2 PUFFS BY MOUTH TWICE DAILY TO PREVENT COUGH OR WHEEZE. RINSE, GARGLE AND SPIT AFTER USE   topiramate 50 MG tablet Commonly known as: TOPAMAX Take 0.5 tablets (25 mg total) by mouth daily.   UNABLE TO FIND Allergy injections for cats, grass, and dust.   valsartan-hydrochlorothiazide 320-25 MG tablet Commonly known as: DIOVAN-HCT Take 1 tablet by mouth daily.   Viibryd 40 MG Tabs Generic drug: Vilazodone HCl Take 1 tablet (40 mg total) by mouth daily.   vitamin B-12 500 MCG tablet Commonly known as: CYANOCOBALAMIN Take 500 mcg by mouth daily.       Past Medical History:  Diagnosis Date  . Asthma   . Breast cancer ([place]) 2021  . Carpal tunnel syndrome   . Diabetes ([place])   . GERD (gastroesophageal reflux disease)   . Severe anemia [id number]    Past Surgical History:  Procedure Laterality Date  . caesarean    . CHOLECYSTECTOMY    . HIP SURGERY Left   . PORT A CATH INJECTION ([facility])  2021    Review of systems negative except as noted in HPI / PMHx or noted below:  Review of Systems  Constitutional: Negative.   HENT: Negative.   Eyes: Negative.   Respiratory: Negative.   Cardiovascular: Negative.   Gastrointestinal: Negative.   Genitourinary: Negative.   Musculoskeletal: Negative.   Skin: Negative.   Neurological: Negative.     Endo/Heme/Allergies: Negative.   Psychiatric/Behavioral: Negative.      Objective:   Vitals:   12/23/19 0836  BP: (!) 134/100  Pulse: (!) 116  Resp: 19  SpO2: 99%   Height: 5' 4.7" (164.3 cm)  Weight: 233 lb (105.7 kg)   Physical Exam Constitutional:      Appearance: She is not diaphoretic.  HENT:     Head: Normocephalic.     Right Ear: Tympanic membrane, ear canal and external ear normal.     Left Ear: Tympanic membrane, ear canal and external ear normal.     Nose: Nose normal. No mucosal edema or rhinorrhea.     Mouth/Throat:     Pharynx: Uvula midline. No oropharyngeal exudate.  Eyes:     Conjunctiva/sclera: Conjunctivae normal.  Neck:     Thyroid: No thyromegaly.     Trachea: Trachea normal. No  tracheal tenderness or tracheal deviation.  Cardiovascular:     Rate and Rhythm: Normal rate and regular rhythm.     Heart sounds: Normal heart sounds, S1 normal and S2 normal. No murmur heard.   Pulmonary:     Effort: No respiratory distress.     Breath sounds: Normal breath sounds. No stridor. No wheezing or rales.  Lymphadenopathy:     Head:     Right side of head: No tonsillar adenopathy.     Left side of head: No tonsillar adenopathy.     Cervical: No cervical adenopathy.  Skin:    Findings: No erythema or rash.     Nails: There is no clubbing.  Neurological:     Mental Status: She is alert.     Diagnostics:    Spirometry was performed and demonstrated an FEV1 of 2.63 at 92 % of predicted.  The patient had an Asthma Control Test with the following results: ACT Total Score: 24.    Assessment and Plan:   1. Allergic rhinitis, unspecified seasonality, unspecified trigger   2. Asthma, moderate persistent, well-controlled   3. LPRD (laryngopharyngeal reflux disease)   4. Stage 3 chronic kidney disease, unspecified whether stage 3a or 3b CKD     1. Continue Symbicort 160 2 inhalations 1-2 times per day depending on disease activity  2.  Continue  Azelastine 1 spray each nostril 3-7 times a week depending on disease activity  3. Continue Nasacort - 1 spray each nostril 3-7 times a week depending on disease activity  4.  If needed:   A. Ventolin  B. OTC antihistamime  C. Nasal saline  5.  Continue immunotherapy and EpiPen  6.  Treat reflux with famotidine 40 mg - 1 tablet twice a day   7.  Return to clinic in 6 months or earlier if problem   Laurie Simmons appears to be doing very well on her current plan.  This is not surprising given the fact that she is receiving dexamethasone every 3 weeks as part of her chemotherapy regime.  She will continue on the therapy noted above which does include immunotherapy.  She will continue on famotidine for her reflux at this point in time given her kidney dysfunction and she will remain away from the use of a proton pump inhibitor at this point.   Laurie Katz, MD Allergy / Immunology [place]

## 2019-12-23 NOTE — Patient Instructions (Addendum)
  1. Continue Symbicort 160 2 inhalations 1-2 times per day depending on disease activity  2.  Continue Azelastine 1 spray each nostril 3-7 times a week depending on disease activity  3. Continue Nasacort - 1 spray each nostril 3-7 times a week depending on disease activity  4.  If needed:   A. Ventolin  B. OTC antihistamime  C. Nasal saline  5.  Continue immunotherapy and EpiPen  6.  Treat reflux with famotidine 40 mg - 1 tablet twice a day   7.  Return to clinic in 6 months or earlier if problem

## 2019-12-27 ENCOUNTER — Other Ambulatory Visit: Payer: Self-pay | Admitting: Allergy and Immunology

## 2019-12-27 ENCOUNTER — Encounter: Payer: Self-pay | Admitting: Allergy and Immunology

## 2019-12-28 ENCOUNTER — Ambulatory Visit (INDEPENDENT_AMBULATORY_CARE_PROVIDER_SITE_OTHER): Payer: BC Managed Care – PPO | Admitting: *Deleted

## 2019-12-28 DIAGNOSIS — J309 Allergic rhinitis, unspecified: Secondary | ICD-10-CM

## 2020-01-06 DIAGNOSIS — J301 Allergic rhinitis due to pollen: Secondary | ICD-10-CM | POA: Diagnosis not present

## 2020-01-06 NOTE — Progress Notes (Signed)
VIALS EXP 01-05-21

## 2020-01-13 ENCOUNTER — Ambulatory Visit (INDEPENDENT_AMBULATORY_CARE_PROVIDER_SITE_OTHER): Payer: BC Managed Care – PPO | Admitting: *Deleted

## 2020-01-13 ENCOUNTER — Other Ambulatory Visit: Payer: Self-pay | Admitting: Allergy and Immunology

## 2020-01-13 DIAGNOSIS — J309 Allergic rhinitis, unspecified: Secondary | ICD-10-CM | POA: Diagnosis not present

## 2020-01-25 DIAGNOSIS — C773 Secondary and unspecified malignant neoplasm of axilla and upper limb lymph nodes: Secondary | ICD-10-CM

## 2020-01-25 DIAGNOSIS — C50912 Malignant neoplasm of unspecified site of left female breast: Secondary | ICD-10-CM

## 2020-01-25 DIAGNOSIS — D500 Iron deficiency anemia secondary to blood loss (chronic): Secondary | ICD-10-CM

## 2020-01-28 ENCOUNTER — Other Ambulatory Visit: Payer: Self-pay

## 2020-01-28 DIAGNOSIS — F39 Unspecified mood [affective] disorder: Secondary | ICD-10-CM

## 2020-01-28 MED ORDER — TOPIRAMATE 50 MG PO TABS: 25.0000 mg | ORAL_TABLET | Freq: Every day | ORAL | 1 refills | Status: DC

## 2020-02-02 ENCOUNTER — Ambulatory Visit (INDEPENDENT_AMBULATORY_CARE_PROVIDER_SITE_OTHER): Payer: BC Managed Care – PPO

## 2020-02-02 DIAGNOSIS — J309 Allergic rhinitis, unspecified: Secondary | ICD-10-CM | POA: Diagnosis not present

## 2020-02-07 ENCOUNTER — Ambulatory Visit (INDEPENDENT_AMBULATORY_CARE_PROVIDER_SITE_OTHER): Payer: BC Managed Care – PPO | Admitting: Adult Health

## 2020-02-07 ENCOUNTER — Other Ambulatory Visit: Payer: Self-pay

## 2020-02-07 ENCOUNTER — Other Ambulatory Visit: Payer: Self-pay | Admitting: Oncology

## 2020-02-07 ENCOUNTER — Encounter: Payer: Self-pay | Admitting: Adult Health

## 2020-02-07 DIAGNOSIS — F411 Generalized anxiety disorder: Secondary | ICD-10-CM

## 2020-02-07 DIAGNOSIS — F331 Major depressive disorder, recurrent, moderate: Secondary | ICD-10-CM

## 2020-02-07 DIAGNOSIS — F39 Unspecified mood [affective] disorder: Secondary | ICD-10-CM | POA: Diagnosis not present

## 2020-02-07 DIAGNOSIS — F410 Panic disorder [episodic paroxysmal anxiety] without agoraphobia: Secondary | ICD-10-CM | POA: Diagnosis not present

## 2020-02-07 DIAGNOSIS — C50912 Malignant neoplasm of unspecified site of left female breast: Secondary | ICD-10-CM

## 2020-02-07 MED ORDER — TOPIRAMATE 50 MG PO TABS: 50.0000 mg | ORAL_TABLET | Freq: Every day | ORAL | 5 refills | Status: DC

## 2020-02-07 MED ORDER — CLORAZEPATE DIPOTASSIUM 3.75 MG PO TABS: ORAL_TABLET | ORAL | 2 refills | Status: DC

## 2020-02-07 MED ORDER — VIIBRYD 40 MG PO TABS: 40.0000 mg | ORAL_TABLET | Freq: Every day | ORAL | 5 refills | Status: DC

## 2020-02-07 NOTE — Progress Notes (Signed)
Laurie Simmons [id number] Feb 28, 1966 54 y.o.  Subjective:   Patient ID:  Laurie Simmons is a 54 y.o. (DOB [birth year]) female.  Chief Complaint: No chief complaint on file.   HPI Laurie Simmons presents to the office today for follow-up of anxiety, panic attacks, depression, and mood disorder.  Describes mood today as "so-so". Pleasant. Mood symptoms - reports depression, anxiety, and irritability - "it comes and goes". Tearful at times. In treatment for breast cancer. Concerned about the "unknown". Complaining of brain fog. Stating "it's been really traumatic, but I'm doing better". Stating "it's difficult and emotional". Diagnosed with stage 2 breast cancer in February. Has finished with chemotherapy - 6 rounds. Upcoming surgery - double mastectomy.  Varying interest and motivation. Taking medications as prescribed.  Energy levels lower. Feels fatigued from chemotherapy. Active, does not have a regular exercise routine.  Enjoys some usual interests and activities. Married. Lives with husband and their two children - son 4 and daughter 12. Mostly staying home with cancer treatment. Going out to eat on Friday nights.  Appetite adequate. Weight gain with steroids. Sleeps better some nights than others. Averages 6 to 7 hours. Some daytime napping.  Focus and concentration difficulties. Completing tasks. Managing aspects of household.  Denies SI or HI. Denies AH or VH.   Review of Systems:  Review of Systems  Musculoskeletal: Negative for gait problem.  Neurological: Negative for tremors.  Psychiatric/Behavioral:       Please refer to HPI    Medications: I have reviewed the patient's current medications.  Current Outpatient Medications  Medication Sig Dispense Refill  . albuterol (VENTOLIN HFA) 108 (90 Base) MCG/ACT inhaler Inhale two puffs every 4-6 hours if needed for cough or wheeze 18 g 1  . Azelastine HCl 0.15 % SOLN Can use one spray in each nostril two times daily if needed. 90 mL 1  . Blood  Glucose Monitoring Suppl (ONE TOUCH ULTRA 2) w/Device KIT USE TO CHECK BLOOD SUGAR DAILY    . clorazepate (TRANXENE) 3.75 MG tablet Take one tablet up to four times daily as needed for anxiety/panic attacks. 120 tablet 2  . dexamethasone (DECADRON) 4 MG tablet TAKE 1 TABLET BY MOUTH TWICE DAILY THE DAY BEFORE CHEMO. TAKE 2 TABLETS TWICE DAILY STARTING THE DAY AFTER CHEMO FOR 3 DAYS.    Marland Kitchen docusate sodium (COLACE) 50 MG capsule Take 50 mg by mouth 2 (two) times daily.    . famotidine (PEPCID) 40 MG tablet TAKE 1 TABLET BY MOUTH TWICE DAILY AS DIRECTED 60 tablet 5  . ferrous sulfate 324 MG TBEC Take 65 mg by mouth. Once daily    . fluticasone (FLONASE) 50 MCG/ACT nasal spray     . folic acid (FOLVITE) 1 MG tablet Take 1 mg by mouth daily.    Marland Kitchen glucose blood (ONETOUCH ULTRA) test strip USE TO CHECK BLOOD SUGAR DAILY    . hydrOXYzine (ATARAX/VISTARIL) 10 MG tablet Take 10-20 mg by mouth 2 (two) times daily as needed. (Patient not taking: Reported on 12/23/2019)    . ipratropium (ATROVENT) 0.06 % nasal spray Use 2 sprays in each nostril every 6 hours to dry up nose if needed 15 mL 5  . lidocaine (XYLOCAINE) 2 % solution  (Patient not taking: Reported on 12/23/2019)    . loratadine (CLARITIN) 10 MG tablet Take 10 mg by mouth daily.    Marland Kitchen MAALOX 600 MG chewable tablet SWISH AND SPIT 5 MLS BY MOUTH FOUR TIMES DAILY AS NEEDED    . medroxyPROGESTERone (  PROVERA) 5 MG tablet Take 10 mg by mouth at bedtime.    . mupirocin ointment (BACTROBAN) 2 % Place 1 application into the nose 2 (two) times daily. Apply in each nostril 3 times daily    . ondansetron (ZOFRAN) 4 MG tablet Take 4 mg by mouth every 4 (four) hours as needed.    . ondansetron (ZOFRAN-ODT) 4 MG disintegrating tablet Take 4 mg by mouth every 6 (six) hours as needed.    . potassium chloride (KLOR-CON) 10 MEQ tablet Take 10 mEq by mouth daily.    . prochlorperazine (COMPAZINE) 10 MG tablet Take 10 mg by mouth every 6 (six) hours as needed for nausea or  vomiting.    . rosuvastatin (CRESTOR) 5 MG tablet TK 1 T PO 2 TIMES WEEKLY  1  . Semaglutide,0.25 or 0.5MG/DOS, (OZEMPIC, 0.25 OR 0.5 MG/DOSE,) 2 MG/1.5ML SOPN Inject 0.5 mg into the skin. Once weekly    . SYMBICORT 160-4.5 MCG/ACT inhaler INHALE 2 PUFFS BY MOUTH TWICE DAILY TO PREVENT COUGH OR WHEEZE. RINSE, GARGLE AND SPIT AFTER USE 10.2 g 1  . topiramate (TOPAMAX) 50 MG tablet Take 1 tablet (50 mg total) by mouth daily. 30 tablet 5  . UNABLE TO FIND Allergy injections for cats, grass, and dust.    . valsartan-hydrochlorothiazide (DIOVAN-HCT) 320-25 MG tablet Take 1 tablet by mouth daily.  0  . VIIBRYD 40 MG TABS Take 1 tablet (40 mg total) by mouth daily. 30 tablet 5  . vitamin B-12 (CYANOCOBALAMIN) 500 MCG tablet Take 500 mcg by mouth daily.     No current facility-administered medications for this visit.    Medication Side Effects: None  Allergies:  Allergies  Allergen Reactions  . Prednisone Hives and Other (See Comments)    Mood Swings.  . Codeine Anxiety    Past Medical History:  Diagnosis Date  . Asthma   . Breast cancer ([place]) 2021  . Carpal tunnel syndrome   . Diabetes ([place])   . GERD (gastroesophageal reflux disease)   . Severe anemia [id number]    Family History  Problem Relation Age of Onset  . Pulmonary fibrosis Father     Social History   Socioeconomic History  . Marital status: Married    Spouse name: Not on file  . Number of children: Not on file  . Years of education: Not on file  . Highest education level: Not on file  Occupational History  . Not on file  Tobacco Use  . Smoking status: Never Smoker  . Smokeless tobacco: Never Used  Vaping Use  . Vaping Use: Never used  Substance and Sexual Activity  . Alcohol use: No  . Drug use: No  . Sexual activity: Yes  Other Topics Concern  . Not on file  Social History Narrative  . Not on file   Social Determinants of Health   Financial Resource Strain:   . Difficulty of Paying Living Expenses:    Food Insecurity:   . Worried About Charity fundraiser in the Last Year:   . Arboriculturist in the Last Year:   Transportation Needs:   . Film/video editor (Medical):   Marland Kitchen Lack of Transportation (Non-Medical):   Physical Activity:   . Days of Exercise per Week:   . Minutes of Exercise per Session:   Stress:   . Feeling of Stress :   Social Connections:   . Frequency of Communication with Friends and Family:   . Frequency of Social  Gatherings with Friends and Family:   . Attends Religious Services:   . Active Member of Clubs or Organizations:   . Attends Archivist Meetings:   Marland Kitchen Marital Status:   Intimate Partner Violence:   . Fear of Current or Ex-Partner:   . Emotionally Abused:   Marland Kitchen Physically Abused:   . Sexually Abused:     Past Medical History, Surgical history, Social history, and Family history were reviewed and updated as appropriate.   Please see review of systems for further details on the patient's review from today.   Objective:   Physical Exam:  There were no vitals taken for this visit.  Physical Exam Constitutional:      General: She is not in acute distress. Musculoskeletal:        General: No deformity.  Neurological:     Mental Status: She is alert and oriented to person, place, and time.     Coordination: Coordination normal.  Psychiatric:        Attention and Perception: Attention and perception normal. She does not perceive auditory or visual hallucinations.        Mood and Affect: Mood normal. Mood is not anxious or depressed. Affect is not labile, blunt, angry or inappropriate.        Speech: Speech normal.        Behavior: Behavior normal.        Thought Content: Thought content normal. Thought content is not paranoid or delusional. Thought content does not include homicidal or suicidal ideation. Thought content does not include homicidal or suicidal plan.        Cognition and Memory: Cognition and memory normal.         Judgment: Judgment normal.     Comments: Insight intact     Lab Review:     Component Value Date/Time   NA 135 10/20/2008 2105   K 4.5 10/20/2008 2105   CL 106 10/20/2008 2105   CO2 22 10/20/2008 2105   GLUCOSE 97 10/21/2008 0545   BUN 16 10/20/2008 2105   CREATININE 0.76 10/20/2008 2105   CALCIUM 9.7 10/20/2008 2105   PROT 6.0 10/20/2008 2105   ALBUMIN 3.1 (L) 10/20/2008 2105   AST 24 10/20/2008 2105   ALT 19 10/20/2008 2105   ALKPHOS 94 10/20/2008 2105   BILITOT 0.2 (L) 10/20/2008 2105   GFRNONAA >60 10/20/2008 2105   GFRAA  10/20/2008 2105    >60        The eGFR has been calculated using the MDRD equation. This calculation has not been validated in all clinical situations. eGFR's persistently <60 mL/min signify possible Chronic Kidney Disease.       Component Value Date/Time   WBC 9.8 12/23/2018 1146   WBC 12.3 (H) 10/22/2008 0530   RBC 5.21 12/23/2018 1146   RBC 3.19 (L) 10/22/2008 0530   HGB 11.6 12/23/2018 1146   HCT 36.4 12/23/2018 1146   PLT 228 10/22/2008 0530   MCV 70 (L) 12/23/2018 1146   MCH 22.3 (L) 12/23/2018 1146   MCHC 31.9 12/23/2018 1146   MCHC 33.7 10/22/2008 0530   RDW 16.9 (H) 12/23/2018 1146   LYMPHSABS 2.8 12/23/2018 1146   EOSABS 0.2 12/23/2018 1146   BASOSABS 0.1 12/23/2018 1146    No results found for: POCLITH, LITHIUM   No results found for: PHENYTOIN, PHENOBARB, VALPROATE, CBMZ   .res Assessment: Plan:    Plan:  1. Viibyrd 12m daily 2. Tranxene 3.76mup to 4 times daily 3.  Topamax 50 mg daily  See therapist  RTC 3 months  Patient advised to contact office with any questions, adverse effects, or acute worsening in signs and symptoms.  Discussed potential benefits, risk, and side effects of benzodiazepines to include potential risk of tolerance and dependence, as well as possible drowsiness.  Advised patient not to drive if experiencing drowsiness and to take lowest possible effective dose to minimize risk of  dependence and tolerance.  Diagnoses and all orders for this visit:  Major depressive disorder, recurrent episode, moderate (HCC) -     VIIBRYD 40 MG TABS; Take 1 tablet (40 mg total) by mouth daily.  Generalized anxiety disorder -     VIIBRYD 40 MG TABS; Take 1 tablet (40 mg total) by mouth daily. -     clorazepate (TRANXENE) 3.75 MG tablet; Take one tablet up to four times daily as needed for anxiety/panic attacks.  Episodic mood disorder (HCC) -     topiramate (TOPAMAX) 50 MG tablet; Take 1 tablet (50 mg total) by mouth daily.  Panic attacks -     clorazepate (TRANXENE) 3.75 MG tablet; Take one tablet up to four times daily as needed for anxiety/panic attacks.     Please see After Visit Summary for patient specific instructions.  Future Appointments  Date Time Provider [place]  03/05/2020  9:00 AM [facility] MR 1 [id number] [address] [facility]  06/26/2020 10:00 AM Kozlow, Donnamarie Poag, MD [facility] None    No orders of the defined types were placed in this encounter.   -------------------------------

## 2020-02-08 ENCOUNTER — Ambulatory Visit (INDEPENDENT_AMBULATORY_CARE_PROVIDER_SITE_OTHER): Payer: BC Managed Care – PPO | Admitting: *Deleted

## 2020-02-08 DIAGNOSIS — J309 Allergic rhinitis, unspecified: Secondary | ICD-10-CM | POA: Diagnosis not present

## 2020-02-14 ENCOUNTER — Ambulatory Visit (INDEPENDENT_AMBULATORY_CARE_PROVIDER_SITE_OTHER): Payer: BC Managed Care – PPO | Admitting: *Deleted

## 2020-02-14 DIAGNOSIS — J309 Allergic rhinitis, unspecified: Secondary | ICD-10-CM | POA: Diagnosis not present

## 2020-02-22 ENCOUNTER — Ambulatory Visit (INDEPENDENT_AMBULATORY_CARE_PROVIDER_SITE_OTHER): Payer: BC Managed Care – PPO | Admitting: *Deleted

## 2020-02-22 DIAGNOSIS — J309 Allergic rhinitis, unspecified: Secondary | ICD-10-CM | POA: Diagnosis not present

## 2020-02-24 NOTE — Progress Notes (Signed)
EXP 02/23/21

## 2020-02-25 DIAGNOSIS — J3081 Allergic rhinitis due to animal (cat) (dog) hair and dander: Secondary | ICD-10-CM

## 2020-02-28 ENCOUNTER — Ambulatory Visit (INDEPENDENT_AMBULATORY_CARE_PROVIDER_SITE_OTHER): Payer: BC Managed Care – PPO | Admitting: *Deleted

## 2020-02-28 DIAGNOSIS — J309 Allergic rhinitis, unspecified: Secondary | ICD-10-CM | POA: Diagnosis not present

## 2020-03-05 ENCOUNTER — Other Ambulatory Visit: Payer: Self-pay

## 2020-03-05 ENCOUNTER — Ambulatory Visit
Admission: RE | Admit: 2020-03-05 | Discharge: 2020-03-05 | Disposition: A | Discharge status: Home / Self Care | Payer: BC Managed Care – PPO | Source: Ambulatory Visit | Attending: Oncology | Admitting: Oncology

## 2020-03-05 DIAGNOSIS — C50912 Malignant neoplasm of unspecified site of left female breast: Secondary | ICD-10-CM

## 2020-03-05 MED ORDER — GADOBUTROL 1 MMOL/ML IV SOLN
10.0000 mL | Freq: Once | INTRAVENOUS | Status: AC | PRN
  Administered 2020-03-05: 10 mL via INTRAVENOUS

## 2020-03-06 ENCOUNTER — Other Ambulatory Visit: Payer: Self-pay | Admitting: Allergy and Immunology

## 2020-03-07 ENCOUNTER — Ambulatory Visit (INDEPENDENT_AMBULATORY_CARE_PROVIDER_SITE_OTHER): Payer: BC Managed Care – PPO | Admitting: *Deleted

## 2020-03-07 DIAGNOSIS — J309 Allergic rhinitis, unspecified: Secondary | ICD-10-CM | POA: Diagnosis not present

## 2020-03-08 DIAGNOSIS — D500 Iron deficiency anemia secondary to blood loss (chronic): Secondary | ICD-10-CM | POA: Diagnosis not present

## 2020-03-15 DIAGNOSIS — C773 Secondary and unspecified malignant neoplasm of axilla and upper limb lymph nodes: Secondary | ICD-10-CM | POA: Diagnosis not present

## 2020-03-15 DIAGNOSIS — C801 Malignant (primary) neoplasm, unspecified: Secondary | ICD-10-CM | POA: Diagnosis not present

## 2020-04-05 ENCOUNTER — Ambulatory Visit (INDEPENDENT_AMBULATORY_CARE_PROVIDER_SITE_OTHER): Payer: BC Managed Care – PPO

## 2020-04-05 DIAGNOSIS — J309 Allergic rhinitis, unspecified: Secondary | ICD-10-CM | POA: Diagnosis not present

## 2020-04-09 ENCOUNTER — Other Ambulatory Visit: Payer: Self-pay | Admitting: Allergy and Immunology

## 2020-04-13 ENCOUNTER — Ambulatory Visit (INDEPENDENT_AMBULATORY_CARE_PROVIDER_SITE_OTHER): Payer: BC Managed Care – PPO

## 2020-04-13 DIAGNOSIS — J309 Allergic rhinitis, unspecified: Secondary | ICD-10-CM

## 2020-04-17 ENCOUNTER — Telehealth: Payer: Self-pay | Admitting: Adult Health

## 2020-04-17 ENCOUNTER — Other Ambulatory Visit: Payer: Self-pay

## 2020-04-17 MED ORDER — BUPROPION HCL ER (XL) 150 MG PO TB24: ORAL_TABLET | ORAL | 0 refills | Status: DC

## 2020-04-17 NOTE — Telephone Encounter (Signed)
Rtc to patient, increased depression this past 2-3 weeks or longer. Patient had Mastectomy on 03/15/2020, she found out she had breast cancer in Feb 2021. She's had chemo as well, she will be starting radiation in a week and just feels like she can't do anymore. Obviously very emotional, crying, just wants to lay on the couch all the time. She does have supportive family checking on her through text and phone calls. Her Mom calls multiple times a day.  Her husband who is also at home, her daughter away at college but text often.    Taking all medications as prescribed in last office note, informed her I would update you with the information and follow up.

## 2020-04-17 NOTE — Telephone Encounter (Signed)
Rx sent 

## 2020-04-17 NOTE — Telephone Encounter (Signed)
She has no history of seizures. When I mentioned the generic Buproprion XL she thought a few years ago you started her on something that started with a "B" and it caused mouth ulcers. It was something you gave at the other practice. Do you remember anything? She does not remember the name.   She agrees to try the Wellbutrin and see.   [facility] [address]

## 2020-04-17 NOTE — Telephone Encounter (Signed)
Pt LM on VM stating she has been very depressed for the past week. Asking if you could call something in for her. Next apt 10/26. Contact # [phone]

## 2020-04-17 NOTE — Telephone Encounter (Signed)
Reviewed, she is willing to try this and see.

## 2020-04-17 NOTE — Telephone Encounter (Signed)
We could add Wellbutrin XL 150mg  in the mornings - if no seizure history.

## 2020-04-17 NOTE — Telephone Encounter (Signed)
May have been Buspar.

## 2020-04-20 ENCOUNTER — Ambulatory Visit (INDEPENDENT_AMBULATORY_CARE_PROVIDER_SITE_OTHER): Payer: BC Managed Care – PPO

## 2020-04-20 DIAGNOSIS — J309 Allergic rhinitis, unspecified: Secondary | ICD-10-CM | POA: Diagnosis not present

## 2020-05-09 ENCOUNTER — Ambulatory Visit: Payer: BC Managed Care – PPO | Admitting: Adult Health

## 2020-05-09 ENCOUNTER — Ambulatory Visit (INDEPENDENT_AMBULATORY_CARE_PROVIDER_SITE_OTHER): Payer: BC Managed Care – PPO

## 2020-05-09 DIAGNOSIS — J309 Allergic rhinitis, unspecified: Secondary | ICD-10-CM

## 2020-05-11 ENCOUNTER — Encounter: Payer: Self-pay | Admitting: Adult Health

## 2020-05-11 ENCOUNTER — Ambulatory Visit (INDEPENDENT_AMBULATORY_CARE_PROVIDER_SITE_OTHER): Payer: BC Managed Care – PPO | Admitting: Adult Health

## 2020-05-11 ENCOUNTER — Other Ambulatory Visit: Payer: Self-pay

## 2020-05-11 DIAGNOSIS — F410 Panic disorder [episodic paroxysmal anxiety] without agoraphobia: Secondary | ICD-10-CM

## 2020-05-11 DIAGNOSIS — F411 Generalized anxiety disorder: Secondary | ICD-10-CM | POA: Diagnosis not present

## 2020-05-11 DIAGNOSIS — F39 Unspecified mood [affective] disorder: Secondary | ICD-10-CM

## 2020-05-11 DIAGNOSIS — F331 Major depressive disorder, recurrent, moderate: Secondary | ICD-10-CM

## 2020-05-11 MED ORDER — VIIBRYD 40 MG PO TABS: 40.0000 mg | ORAL_TABLET | Freq: Every day | ORAL | 5 refills | Status: DC

## 2020-05-11 MED ORDER — TOPIRAMATE 50 MG PO TABS: 50.0000 mg | ORAL_TABLET | Freq: Every day | ORAL | 5 refills | Status: DC

## 2020-05-11 MED ORDER — CLORAZEPATE DIPOTASSIUM 3.75 MG PO TABS: ORAL_TABLET | ORAL | 2 refills | Status: DC

## 2020-05-11 MED ORDER — BUPROPION HCL ER (XL) 300 MG PO TB24: ORAL_TABLET | ORAL | 2 refills | Status: DC

## 2020-05-11 NOTE — Progress Notes (Signed)
Laurie Simmons [id number] [birth year] 54 y.o.  Subjective:   Patient ID:  Laurie Simmons is a 54 y.o. (DOB [birth year]) female.  Chief Complaint: No chief complaint on file.   HPI Laurie Simmons presents to the office today for follow-up of anxiety, panic attacks, depression, and mood disorder.  Describes mood today as "not the best". Tearful at times. Pleasant. Mood symptoms - reports depression, anxiety, and irritability - "no more so than usual". Started the Wellbutrin XL 166m - "not really helping yet". Is tolerating and would like to increase dose. In treatment for breast cancer. Has started radiation. Can't talk about cancer treatment without crying. Wanting to see a therapist - but no one available. Reading stories from others.  Recent mastectomy - has started radiation - 30 treatments - completed 9. Lacks interest and motivation. Taking medications as prescribed.  Energy levels lower. Feels fatigued and has no desire to go. Active, does not have a regular exercise routine with current physical disabilities..  Enjoys some usual interests and activities. Married. Lives with husband and their two children - son 89and daughter 127 Mostly staying home with cancer treatment.  Appetite adequate. Weight gain. Sleeps better some nights than others. Averages 6 to 7 hours.  Focus and concentration difficulties. Completing tasks. Managing aspects of household. Currently disabled. Denies SI or HI. Denies AH or VH.   Review of Systems:  Review of Systems  Musculoskeletal: Negative for gait problem.  Neurological: Negative for tremors.  Psychiatric/Behavioral:       Please refer to HPI    Medications: I have reviewed the patient's current medications.  Current Outpatient Medications  Medication Sig Dispense Refill  . albuterol (VENTOLIN HFA) 108 (90 Base) MCG/ACT inhaler Inhale two puffs every 4-6 hours if needed for cough or wheeze 18 g 1  . Azelastine HCl 0.15  % SOLN Can use one spray in each nostril two times daily if needed. 90 mL 1  . Blood Glucose Monitoring Suppl (ONE TOUCH ULTRA 2) w/Device KIT USE TO CHECK BLOOD SUGAR DAILY    . buPROPion (WELLBUTRIN XL) 300 MG 24 hr tablet Take 1 tablet by mouth in the morning. 30 tablet 2  . clorazepate (TRANXENE) 3.75 MG tablet Take one tablet up to four times daily as needed for anxiety/panic attacks. 120 tablet 2  . dexamethasone (DECADRON) 4 MG tablet TAKE 1 TABLET BY MOUTH TWICE DAILY THE DAY BEFORE CHEMO. TAKE 2 TABLETS TWICE DAILY STARTING THE DAY AFTER CHEMO FOR 3 DAYS.    .Marland Kitchendocusate sodium (COLACE) 50 MG capsule Take 50 mg by mouth 2 (two) times daily.    . famotidine (PEPCID) 40 MG tablet TAKE 1 TABLET BY MOUTH TWICE DAILY AS DIRECTED 60 tablet 5  . ferrous sulfate 324 MG TBEC Take 65 mg by mouth. Once daily    . fluticasone (FLONASE) 50 MCG/ACT nasal spray     . folic acid (FOLVITE) 1 MG tablet Take 1 mg by mouth daily.    .Marland Kitchenglucose blood (ONETOUCH ULTRA) test strip USE TO CHECK BLOOD SUGAR DAILY    . hydrOXYzine (ATARAX/VISTARIL) 10 MG tablet Take 10-20 mg by mouth 2 (two) times daily as needed. (Patient not taking: Reported on 12/23/2019)    . ipratropium (ATROVENT) 0.06 % nasal spray Use 2 sprays in each nostril every 6 hours to dry up nose if needed 15 mL 5  . lidocaine (XYLOCAINE) 2 % solution  (Patient not taking: Reported on 12/23/2019)    .  loratadine (CLARITIN) 10 MG tablet Take 10 mg by mouth daily.    Marland Kitchen MAALOX 600 MG chewable tablet SWISH AND SPIT 5 MLS BY MOUTH FOUR TIMES DAILY AS NEEDED    . medroxyPROGESTERone (PROVERA) 5 MG tablet Take 10 mg by mouth at bedtime.    . mupirocin ointment (BACTROBAN) 2 % Place 1 application into the nose 2 (two) times daily. Apply in each nostril 3 times daily    . ondansetron (ZOFRAN) 4 MG tablet Take 4 mg by mouth every 4 (four) hours as needed.    . ondansetron (ZOFRAN-ODT) 4 MG disintegrating tablet Take 4 mg by mouth every 6 (six) hours as needed.     . potassium chloride (KLOR-CON) 10 MEQ tablet Take 10 mEq by mouth daily.    . prochlorperazine (COMPAZINE) 10 MG tablet Take 10 mg by mouth every 6 (six) hours as needed for nausea or vomiting.    . rosuvastatin (CRESTOR) 5 MG tablet TK 1 T PO 2 TIMES WEEKLY  1  . Semaglutide,0.25 or 0.5MG/DOS, (OZEMPIC, 0.25 OR 0.5 MG/DOSE,) 2 MG/1.5ML SOPN Inject 0.5 mg into the skin. Once weekly    . SYMBICORT 160-4.5 MCG/ACT inhaler INHALE 2 PUFFS BY MOUTH TWICE DAILY TO PREVENT COUGH OR WHEEZE. RINSE, GARGLE AND SPIT AFTER USE 10.2 g 2  . topiramate (TOPAMAX) 50 MG tablet Take 1 tablet (50 mg total) by mouth daily. 30 tablet 5  . UNABLE TO FIND Allergy injections for cats, grass, and dust.    . valsartan-hydrochlorothiazide (DIOVAN-HCT) 320-25 MG tablet Take 1 tablet by mouth daily.  0  . VIIBRYD 40 MG TABS Take 1 tablet (40 mg total) by mouth daily. 30 tablet 5  . vitamin B-12 (CYANOCOBALAMIN) 500 MCG tablet Take 500 mcg by mouth daily.     No current facility-administered medications for this visit.    Medication Side Effects: None  Allergies:  Allergies  Allergen Reactions  . Prednisone Hives and Other (See Comments)    Mood Swings.  . Codeine Anxiety    Past Medical History:  Diagnosis Date  . Asthma   . Breast cancer ([place]) 2021  . Carpal tunnel syndrome   . Diabetes ([place])   . GERD (gastroesophageal reflux disease)   . Severe anemia [id number]    Family History  Problem Relation Age of Onset  . Pulmonary fibrosis Father     Social History   Socioeconomic History  . Marital status: Married    Spouse name: Not on file  . Number of children: Not on file  . Years of education: Not on file  . Highest education level: Not on file  Occupational History  . Not on file  Tobacco Use  . Smoking status: Never Smoker  . Smokeless tobacco: Never Used  Vaping Use  . Vaping Use: Never used  Substance and Sexual Activity  . Alcohol use: No  . Drug use: No  . Sexual activity: Yes   Other Topics Concern  . Not on file  Social History Narrative  . Not on file   Social Determinants of Health   Financial Resource Strain:   . Difficulty of Paying Living Expenses: Not on file  Food Insecurity:   . Worried About Charity fundraiser in the Last Year: Not on file  . Ran Out of Food in the Last Year: Not on file  Transportation Needs:   . Lack of Transportation (Medical): Not on file  . Lack of Transportation (Non-Medical): Not on file  Physical  Activity:   . Days of Exercise per Week: Not on file  . Minutes of Exercise per Session: Not on file  Stress:   . Feeling of Stress : Not on file  Social Connections:   . Frequency of Communication with Friends and Family: Not on file  . Frequency of Social Gatherings with Friends and Family: Not on file  . Attends Religious Services: Not on file  . Active Member of Clubs or Organizations: Not on file  . Attends Archivist Meetings: Not on file  . Marital Status: Not on file  Intimate Partner Violence:   . Fear of Current or Ex-Partner: Not on file  . Emotionally Abused: Not on file  . Physically Abused: Not on file  . Sexually Abused: Not on file    Past Medical History, Surgical history, Social history, and Family history were reviewed and updated as appropriate.   Please see review of systems for further details on the patient's review from today.   Objective:   Physical Exam:  There were no vitals taken for this visit.  Physical Exam Constitutional:      General: She is not in acute distress. Musculoskeletal:        General: No deformity.  Neurological:     Mental Status: She is alert and oriented to person, place, and time.     Coordination: Coordination normal.  Psychiatric:        Attention and Perception: Attention and perception normal. She does not perceive auditory or visual hallucinations.        Mood and Affect: Mood normal. Mood is not anxious or depressed. Affect is not labile,  blunt, angry or inappropriate.        Speech: Speech normal.        Behavior: Behavior normal.        Thought Content: Thought content normal. Thought content is not paranoid or delusional. Thought content does not include homicidal or suicidal ideation. Thought content does not include homicidal or suicidal plan.        Cognition and Memory: Cognition and memory normal.        Judgment: Judgment normal.     Comments: Insight intact     Lab Review:     Component Value Date/Time   NA 135 10/20/2008 2105   K 4.5 10/20/2008 2105   CL 106 10/20/2008 2105   CO2 22 10/20/2008 2105   GLUCOSE 97 10/21/2008 0545   BUN 16 10/20/2008 2105   CREATININE 0.76 10/20/2008 2105   CALCIUM 9.7 10/20/2008 2105   PROT 6.0 10/20/2008 2105   ALBUMIN 3.1 (L) 10/20/2008 2105   AST 24 10/20/2008 2105   ALT 19 10/20/2008 2105   ALKPHOS 94 10/20/2008 2105   BILITOT 0.2 (L) 10/20/2008 2105   GFRNONAA >60 10/20/2008 2105   GFRAA  10/20/2008 2105    >60        The eGFR has been calculated using the MDRD equation. This calculation has not been validated in all clinical situations. eGFR's persistently <60 mL/min signify possible Chronic Kidney Disease.       Component Value Date/Time   WBC 9.8 12/23/2018 1146   WBC 12.3 (H) 10/22/2008 0530   RBC 5.21 12/23/2018 1146   RBC 3.19 (L) 10/22/2008 0530   HGB 11.6 12/23/2018 1146   HCT 36.4 12/23/2018 1146   PLT 228 10/22/2008 0530   MCV 70 (L) 12/23/2018 1146   MCH 22.3 (L) 12/23/2018 1146   MCHC 31.9 12/23/2018 1146  MCHC 33.7 10/22/2008 0530   RDW 16.9 (H) 12/23/2018 1146   LYMPHSABS 2.8 12/23/2018 1146   EOSABS 0.2 12/23/2018 1146   BASOSABS 0.1 12/23/2018 1146    No results found for: POCLITH, LITHIUM   No results found for: PHENYTOIN, PHENOBARB, VALPROATE, CBMZ   .res Assessment: Plan:    Plan:  1. Viibyrd 62m daily 2. Tranxene 3.759mup to 4 times daily 3. Topamax 50 mg daily 4. Increase Wellbutrin XL 15036mo 300m39mery  morning - denies seizure history  RTC 3 months  Patient advised to contact office with any questions, adverse effects, or acute worsening in signs and symptoms.  Discussed potential benefits, risk, and side effects of benzodiazepines to include potential risk of tolerance and dependence, as well as possible drowsiness.  Advised patient not to drive if experiencing drowsiness and to take lowest possible effective dose to minimize risk of dependence and tolerance.   Diagnoses and all orders for this visit:  Major depressive disorder, recurrent episode, moderate (HCC) -     buPROPion (WELLBUTRIN XL) 300 MG 24 hr tablet; Take 1 tablet by mouth in the morning. -     VIIBRYD 40 MG TABS; Take 1 tablet (40 mg total) by mouth daily.  Episodic mood disorder (HCC) -     topiramate (TOPAMAX) 50 MG tablet; Take 1 tablet (50 mg total) by mouth daily.  Generalized anxiety disorder -     VIIBRYD 40 MG TABS; Take 1 tablet (40 mg total) by mouth daily. -     clorazepate (TRANXENE) 3.75 MG tablet; Take one tablet up to four times daily as needed for anxiety/panic attacks.  Panic attacks -     clorazepate (TRANXENE) 3.75 MG tablet; Take one tablet up to four times daily as needed for anxiety/panic attacks.     Please see After Visit Summary for patient specific instructions.  Future Appointments  Date Time Provider [place]/13/2021 10:00 AM Kozlow, EricDonnamarie Poag [facility] None    No orders of the defined types were placed in this encounter.   -------------------------------

## 2020-05-15 ENCOUNTER — Other Ambulatory Visit: Payer: Self-pay | Admitting: Adult Health

## 2020-05-16 ENCOUNTER — Ambulatory Visit (INDEPENDENT_AMBULATORY_CARE_PROVIDER_SITE_OTHER): Payer: BC Managed Care – PPO

## 2020-05-16 DIAGNOSIS — J309 Allergic rhinitis, unspecified: Secondary | ICD-10-CM | POA: Diagnosis not present

## 2020-05-25 ENCOUNTER — Ambulatory Visit (INDEPENDENT_AMBULATORY_CARE_PROVIDER_SITE_OTHER): Payer: BC Managed Care – PPO | Admitting: *Deleted

## 2020-05-25 DIAGNOSIS — J309 Allergic rhinitis, unspecified: Secondary | ICD-10-CM | POA: Diagnosis not present

## 2020-05-29 ENCOUNTER — Other Ambulatory Visit: Payer: Self-pay

## 2020-05-29 ENCOUNTER — Encounter: Payer: Self-pay | Admitting: Allergy and Immunology

## 2020-05-29 ENCOUNTER — Ambulatory Visit (INDEPENDENT_AMBULATORY_CARE_PROVIDER_SITE_OTHER): Payer: BC Managed Care – PPO | Admitting: Allergy and Immunology

## 2020-05-29 VITALS — BP 146/92 | HR 92 | Resp 20

## 2020-05-29 DIAGNOSIS — J4540 Moderate persistent asthma, uncomplicated: Secondary | ICD-10-CM | POA: Diagnosis not present

## 2020-05-29 DIAGNOSIS — J3089 Other allergic rhinitis: Secondary | ICD-10-CM

## 2020-05-29 DIAGNOSIS — R0600 Dyspnea, unspecified: Secondary | ICD-10-CM

## 2020-05-29 DIAGNOSIS — K219 Gastro-esophageal reflux disease without esophagitis: Secondary | ICD-10-CM

## 2020-05-29 DIAGNOSIS — R0609 Other forms of dyspnea: Secondary | ICD-10-CM

## 2020-05-29 NOTE — Progress Notes (Signed)
[facility]   Follow-up Note  Referring Provider: Marco Collie, MD Primary Provider: Marco Collie, MD Date of Office Visit: 05/29/2020  Subjective:   Laurie Simmons (DOB: 09/16/1965) is a 54 y.o. female who returns to the [facility] on 05/29/2020 in re-evaluation of the following:  HPI: [name] returns to this clinic in evaluation of asthma and allergic rhinoconjunctivitis and reflux.  Her last visit to this clinic was 23 December 2019.  For the most part her asthma has been under very good control but over the course of the past 1-1/2 weeks or so she has had some shortness of breath.  She feels as though she cannot really take a deep breath especially when she exerts herself.  She is completely out of breath if she exerts herself.  This is occurring even though she has been consistently using prednisone over the course of the past week for a back issue.  She has tried a short acting bronchodilator and this has not helped her issue at all.  She increased her Symbicort to twice a day for the past week and this has not helped her issue at all.  She may have a little bit of sinus drainage and head stuffiness with this issue but no other significant respiratory tract symptoms.  She does not have any anosmia or ugly nasal discharge or sputum production or chest pain or leg swelling.  She does have a lot of mucus stuck in her throat and a lot of throat clearing.  Her reflux has been pretty active because she has been eating a lot of chocolate.  She is using famotidine for her reflux at this point secondary to her kidney disease of which a proton pump inhibitor is not suggested.  She continues on immunotherapy without any adverse effect.  She has received 3 Covid vaccinations and one flu shot this year.  She has completed chemotherapy and surgery for breast cancer and is about two thirds of the way through her radiation  treatment.  Allergies as of 05/29/2020      Reactions   Nsaids Other (See Comments)   Pt reports kidney disease      Medication List    albuterol 108 (90 Base) MCG/ACT inhaler Commonly known as: VENTOLIN HFA Inhale two puffs every 4-6 hours if needed for cough or wheeze   Azelastine HCl 0.15 % Soln Can use one spray in each nostril two times daily if needed.   buPROPion 300 MG 24 hr tablet Commonly known as: WELLBUTRIN XL Take 1 tablet by mouth in the morning.   clorazepate 3.75 MG tablet Commonly known as: TRANXENE Take one tablet up to four times daily as needed for anxiety/panic attacks.   docusate sodium 50 MG capsule Commonly known as: COLACE Take 50 mg by mouth 2 (two) times daily.   famotidine 40 MG tablet Commonly known as: PEPCID TAKE 1 TABLET BY MOUTH TWICE DAILY AS DIRECTED   ferrous sulfate 324 MG Tbec Take 65 mg by mouth. Once daily   fluticasone 50 MCG/ACT nasal spray Commonly known as: FLONASE   folic acid 1 MG tablet Commonly known as: FOLVITE Take 1 mg by mouth daily.   hydrOXYzine 10 MG tablet Commonly known as: ATARAX/VISTARIL Take 10-20 mg by mouth 2 (two) times daily as needed.   ipratropium 0.06 % nasal spray Commonly known as: ATROVENT Use 2 sprays in each nostril every 6 hours to dry up  nose if needed   loratadine 10 MG tablet Commonly known as: CLARITIN Take 10 mg by mouth daily.   Maalox 600 MG chewable tablet Generic drug: Calcium Carbonate Antacid SWISH AND SPIT 5 MLS BY MOUTH FOUR TIMES DAILY AS NEEDED   medroxyPROGESTERone 5 MG tablet Commonly known as: PROVERA Take 10 mg by mouth at bedtime.   mupirocin ointment 2 % Commonly known as: BACTROBAN Place 1 application into the nose 2 (two) times daily. Apply in each nostril 3 times daily   ondansetron 4 MG disintegrating tablet Commonly known as: ZOFRAN-ODT Take 4 mg by mouth every 6 (six) hours as needed.   ondansetron 4 MG tablet Commonly known as: ZOFRAN Take 4 mg  by mouth every 4 (four) hours as needed.   ONE TOUCH ULTRA 2 w/Device Kit USE TO CHECK BLOOD SUGAR DAILY   OneTouch Ultra test strip Generic drug: glucose blood USE TO CHECK BLOOD SUGAR DAILY   Ozempic (0.25 or 0.5 MG/DOSE) 2 MG/1.5ML Sopn Generic drug: Semaglutide(0.25 or 0.5MG/DOS) Inject 0.5 mg into the skin. Once weekly   potassium chloride 10 MEQ tablet Commonly known as: KLOR-CON Take 10 mEq by mouth daily.   prochlorperazine 10 MG tablet Commonly known as: COMPAZINE Take 10 mg by mouth every 6 (six) hours as needed for nausea or vomiting.   rosuvastatin 5 MG tablet Commonly known as: CRESTOR TK 1 T PO 2 TIMES WEEKLY   Symbicort 160-4.5 MCG/ACT inhaler Generic drug: budesonide-formoterol INHALE 2 PUFFS BY MOUTH TWICE DAILY TO PREVENT COUGH OR WHEEZE. RINSE, GARGLE AND SPIT AFTER USE   topiramate 50 MG tablet Commonly known as: TOPAMAX Take 1 tablet (50 mg total) by mouth daily.   UNABLE TO FIND Allergy injections for cats, grass, and dust.   valsartan-hydrochlorothiazide 320-25 MG tablet Commonly known as: DIOVAN-HCT Take 1 tablet by mouth daily.   Viibryd 40 MG Tabs Generic drug: Vilazodone HCl Take 1 tablet (40 mg total) by mouth daily.   vitamin B-12 500 MCG tablet Commonly known as: CYANOCOBALAMIN Take 500 mcg by mouth daily.       Past Medical History:  Diagnosis Date  . Asthma   . Breast cancer ([facility]) 2021  . Carpal tunnel syndrome   . Diabetes ([facility])   . GERD (gastroesophageal reflux disease)   . Severe anemia [id number]    Past Surgical History:  Procedure Laterality Date  . caesarean    . CHOLECYSTECTOMY    . HIP SURGERY Left   . PORT A CATH INJECTION ([facility])  2021    Review of systems negative except as noted in HPI / PMHx or noted below:  Review of Systems  Constitutional: Negative.   HENT: Negative.   Eyes: Negative.   Respiratory: Negative.   Cardiovascular: Negative.   Gastrointestinal: Negative.   Genitourinary: Negative.    Musculoskeletal: Negative.   Skin: Negative.   Neurological: Negative.   Endo/Heme/Allergies: Negative.   Psychiatric/Behavioral: Negative.      Objective:   Vitals:   05/29/20 1625  BP: (!) 146/92  Pulse: 92  Resp: 20  SpO2: 96%          Physical Exam Constitutional:      Appearance: She is not diaphoretic.  HENT:     Head: Normocephalic.     Right Ear: Tympanic membrane, ear canal and external ear normal.     Left Ear: Tympanic membrane, ear canal and external ear normal.     Nose: Nose normal. No mucosal edema (Erythematous) or rhinorrhea.  Mouth/Throat:     Pharynx: Uvula midline. Posterior oropharyngeal erythema present. No oropharyngeal exudate.  Eyes:     Conjunctiva/sclera: Conjunctivae normal.  Neck:     Thyroid: No thyromegaly.     Trachea: Trachea normal. No tracheal tenderness or tracheal deviation.  Cardiovascular:     Rate and Rhythm: Normal rate and regular rhythm.     Heart sounds: Normal heart sounds, S1 normal and S2 normal. No murmur heard.   Pulmonary:     Effort: No respiratory distress.     Breath sounds: Normal breath sounds. No stridor. No wheezing or rales.  Lymphadenopathy:     Head:     Right side of head: No tonsillar adenopathy.     Left side of head: No tonsillar adenopathy.     Cervical: No cervical adenopathy.  Skin:    Findings: No erythema or rash.     Nails: There is no clubbing.  Neurological:     Mental Status: She is alert.     Diagnostics:    Spirometry was performed and demonstrated an FEV1 of 2.88 at 101 % of predicted.  Oxygen saturation on room air at rest was 96%.  Oxygen saturation on room air during walking the hallway was 96%.  Assessment and Plan:   1. Asthma, moderate persistent, well-controlled   2. Other allergic rhinitis   3. LPRD (laryngopharyngeal reflux disease)   4. Dyspnea on exertion     1. Continue Symbicort 160 2 inhalations 2 times per day    2.  Continue Azelastine 1 spray each  nostril 2 times per day  3. Continue Nasacort - 1 spray each nostril 2 times per day  4.  Continue famotidine 40 mg - 1 tablet 2 times per day  5.  Continue immunotherapy and EpiPen  6.  If needed:   A. Ventolin  B. OTC antihistamime  C. Nasal saline  7. Obtain a chest X-ray  8. Arrange for Echo w/doppler  9. Further evaluation and treatment?  10. Return to clinic in 6 months or earlier if problem   I am not exactly sure why Tessla is having such significant dyspnea on exertion that is not responding to the administration of systemic steroid or response to a short acting bronchodilator.  This suggest that there is another process other than asthma contributing to this issue and given the fact that she is receiving radiation therapy to her chest and is undergone chemotherapy for breast cancer I think we need to obtain an imaging procedure of her lower airway and also arrange for her to have an echocardiogram to rule out cardiac dysfunction and possible radiation pneumonitis.  I will contact her with the results of these diagnostic test once they are available for review.  Allena Katz, MD Allergy / Immunology [facility]

## 2020-05-29 NOTE — Patient Instructions (Addendum)
  1. Continue Symbicort 160 2 inhalations 2 times per day    2.  Continue Azelastine 1 spray each nostril 2 times per day  3. Continue Nasacort - 1 spray each nostril 2 times per day  4.  Continue famotidine 40 mg - 1 tablet 2 times per day  5.  Continue immunotherapy and EpiPen  6.  If needed:   A. Ventolin  B. OTC antihistamime  C. Nasal saline  7. Obtain a chest X-ray  8. Arrange for Echo w/doppler  9. Further evaluation and treatment?  10. Return to clinic in 6 months or earlier if problem

## 2020-05-30 ENCOUNTER — Encounter: Payer: Self-pay | Admitting: Allergy and Immunology

## 2020-06-05 ENCOUNTER — Ambulatory Visit (INDEPENDENT_AMBULATORY_CARE_PROVIDER_SITE_OTHER): Payer: BC Managed Care – PPO

## 2020-06-05 ENCOUNTER — Telehealth: Payer: Self-pay

## 2020-06-05 DIAGNOSIS — J309 Allergic rhinitis, unspecified: Secondary | ICD-10-CM

## 2020-06-05 NOTE — Telephone Encounter (Signed)
Per Dr. Neldon Mc, patient informed of normal chest xray. Patient reports she is doing better. Still some yawning. Chest pain and shortness of breath have resolved. She wonders if it may have been connected to her acid reflux.

## 2020-06-05 NOTE — Telephone Encounter (Signed)
As long as she is better lets hold off on obtaining a echocardiogram at this point.

## 2020-06-05 NOTE — Telephone Encounter (Signed)
Patient informed and agreed to plan.

## 2020-06-12 ENCOUNTER — Telehealth: Payer: Self-pay | Admitting: Adult Health

## 2020-06-12 ENCOUNTER — Other Ambulatory Visit: Payer: Self-pay | Admitting: Adult Health

## 2020-06-12 ENCOUNTER — Other Ambulatory Visit: Payer: Self-pay | Admitting: Hematology and Oncology

## 2020-06-12 DIAGNOSIS — F331 Major depressive disorder, recurrent, moderate: Secondary | ICD-10-CM

## 2020-06-12 DIAGNOSIS — C50912 Malignant neoplasm of unspecified site of left female breast: Secondary | ICD-10-CM

## 2020-06-12 MED ORDER — BUPROPION HCL ER (XL) 300 MG PO TB24: ORAL_TABLET | ORAL | 2 refills | Status: DC

## 2020-06-12 NOTE — Telephone Encounter (Signed)
Last dose I'm showing is 300 mg Wellbutrin XL, did you increase higher?

## 2020-06-12 NOTE — Telephone Encounter (Signed)
Pt lm stating the Wellbutrin is working well. She would like a refill for the increased dosage discussed with Rollene Fare. Fill at the [facility] in [place]. Next appt scheduled for 1/27.

## 2020-06-12 NOTE — Telephone Encounter (Signed)
noted 

## 2020-06-12 NOTE — Telephone Encounter (Signed)
Script sent  

## 2020-06-13 ENCOUNTER — Telehealth: Payer: Self-pay | Admitting: Adult Health

## 2020-06-13 NOTE — Telephone Encounter (Signed)
Requesting 450 mg instead of 300 mg

## 2020-06-13 NOTE — Telephone Encounter (Signed)
Pt called and left a message stating that she asked for a increase on the welbutrin 400 or 450 mg instead of 300 mg. She also wants the script sent to [facility] in [place]. Please call her if that increase can't happen at [phone]

## 2020-06-14 ENCOUNTER — Other Ambulatory Visit: Payer: Self-pay | Admitting: Adult Health

## 2020-06-14 DIAGNOSIS — F331 Major depressive disorder, recurrent, moderate: Secondary | ICD-10-CM

## 2020-06-14 MED ORDER — BUPROPION HCL ER (XL) 150 MG PO TB24: ORAL_TABLET | ORAL | 2 refills | Status: DC

## 2020-06-14 NOTE — Telephone Encounter (Signed)
Noted thanks °

## 2020-06-14 NOTE — Telephone Encounter (Signed)
Script sent  

## 2020-06-15 DIAGNOSIS — R262 Difficulty in walking, not elsewhere classified: Secondary | ICD-10-CM | POA: Diagnosis not present

## 2020-06-15 DIAGNOSIS — M461 Sacroiliitis, not elsewhere classified: Secondary | ICD-10-CM | POA: Diagnosis not present

## 2020-06-15 DIAGNOSIS — M545 Low back pain, unspecified: Secondary | ICD-10-CM | POA: Diagnosis not present

## 2020-06-15 DIAGNOSIS — M2560 Stiffness of unspecified joint, not elsewhere classified: Secondary | ICD-10-CM | POA: Diagnosis not present

## 2020-06-16 NOTE — Progress Notes (Signed)
On 05/17/2020 I spoke with patient and helped refer her to [facility] in [place] to see Esmond Harps. Butch Penny called to let me know that she would be leaving the practice. She wanted to know if we have anyone that could provide counseling at the [facility] in [place]. At this time we don't have anyone in [place], but I let her know that I would check with [facility] in [place]. I spoke with Gwinda Maine, MSW. At this time due to staff shortages they cannot really provide services to [facility]. I called and left a message with Butch Penny, let her know to refer to another counselor within [place] if patient ok with this. Told her to call me back if she has any other questions.

## 2020-06-19 ENCOUNTER — Ambulatory Visit (INDEPENDENT_AMBULATORY_CARE_PROVIDER_SITE_OTHER): Payer: BC Managed Care – PPO | Admitting: *Deleted

## 2020-06-19 DIAGNOSIS — J309 Allergic rhinitis, unspecified: Secondary | ICD-10-CM | POA: Diagnosis not present

## 2020-06-19 DIAGNOSIS — M2560 Stiffness of unspecified joint, not elsewhere classified: Secondary | ICD-10-CM | POA: Diagnosis not present

## 2020-06-19 DIAGNOSIS — M545 Low back pain, unspecified: Secondary | ICD-10-CM | POA: Diagnosis not present

## 2020-06-19 DIAGNOSIS — M461 Sacroiliitis, not elsewhere classified: Secondary | ICD-10-CM | POA: Diagnosis not present

## 2020-06-19 DIAGNOSIS — R262 Difficulty in walking, not elsewhere classified: Secondary | ICD-10-CM | POA: Diagnosis not present

## 2020-06-20 DIAGNOSIS — J3081 Allergic rhinitis due to animal (cat) (dog) hair and dander: Secondary | ICD-10-CM | POA: Diagnosis not present

## 2020-06-20 NOTE — Progress Notes (Signed)
Vial - cat exp 06-20-21

## 2020-06-21 DIAGNOSIS — M2560 Stiffness of unspecified joint, not elsewhere classified: Secondary | ICD-10-CM | POA: Diagnosis not present

## 2020-06-21 DIAGNOSIS — M545 Low back pain, unspecified: Secondary | ICD-10-CM | POA: Diagnosis not present

## 2020-06-21 DIAGNOSIS — M461 Sacroiliitis, not elsewhere classified: Secondary | ICD-10-CM | POA: Diagnosis not present

## 2020-06-21 DIAGNOSIS — R262 Difficulty in walking, not elsewhere classified: Secondary | ICD-10-CM | POA: Diagnosis not present

## 2020-06-22 DIAGNOSIS — Z6841 Body Mass Index (BMI) 40.0 and over, adult: Secondary | ICD-10-CM | POA: Diagnosis not present

## 2020-06-22 DIAGNOSIS — E1169 Type 2 diabetes mellitus with other specified complication: Secondary | ICD-10-CM | POA: Diagnosis not present

## 2020-06-22 DIAGNOSIS — M2170 Unequal limb length (acquired), unspecified site: Secondary | ICD-10-CM | POA: Diagnosis not present

## 2020-06-22 DIAGNOSIS — M545 Low back pain, unspecified: Secondary | ICD-10-CM | POA: Diagnosis not present

## 2020-06-23 DIAGNOSIS — E785 Hyperlipidemia, unspecified: Secondary | ICD-10-CM | POA: Diagnosis not present

## 2020-06-23 DIAGNOSIS — E1169 Type 2 diabetes mellitus with other specified complication: Secondary | ICD-10-CM | POA: Diagnosis not present

## 2020-06-23 NOTE — Progress Notes (Signed)
Laurie Simmons  [address] [place],  [place]  [address] [phone]  Clinic Day:  06/26/2020  Referring physician: Marco Collie, MD   This document serves as a record of services personally performed by Hosie Poisson, MD. It was created on their behalf by Curry,Lauren E, a trained medical scribe. The creation of this record is based on the scribe's personal observations and the provider's statements to them.   CHIEF COMPLAINT:  CC: Clinical stage IIB hormone receptor positive left breast cancer  Current Treatment:  Will plan to initiate hormonal therapy with tamoxifen 20 mg daily at the end of December  HISTORY OF PRESENT ILLNESS:  Laurie Simmons is a 54 y.o. female with clinical stage IIB (TX N2 M0) hormone receptor positive left breast cancer with left axillary adenopathy, but no obvious breast primary diagnosed in February.  She developed left axillary lymphadenopathy in December 2020 which did not improve with antibiotics.  Bilateral diagnostic mammogram and left breast ultrasound in January revealed 4 enlarged lymph nodes in the left axilla with the largest measuring 5.5 cm.  Ultrasound guided biopsy in February revealed poorly differentiated carcinoma.  Immunophenotype is consistent with metastatic carcinoma and with GATA-3 positivity, which is most consistent with metastatic breast carcinoma.  Estrogen receptors were positive at 95% and progesterone receptors were positive at 50.  HER2 was negative.  Ki67 was 20%.  Bilateral breast MRI in March did not reveal any evidence of primary malignancy.  It also revealed left axillary lymphadenopathy with 6 enlarged lymph nodes, the largest measuring 3 x 6 cm.  She has been seen by Dr. Noberto Retort to discuss mastectomy and lymph node dissection versus breast conservation.  She had been scheduled for a hysterectomy due to a large uterine fibroid measuring 9.8 cm, as well as uterine enlargement with Dr. Marvel Plan  around the time of her diagnosis, so this has now been put on hold.  She had been on Provera 10 mg daily.  Provera was subsequently increased to 20 mg daily due to persistent heavy vaginal bleeding.  MRI brain did not reveal any metastatic disease.  PET scan revealed enlarged hypermetabolic left subpectoral and axillary adenopathy, with an index node measuring 3.3 cm in short axis with maximum SUV 6.7.  No other hypermetabolic activity was observed.  Echocardiogram revealed ejection fraction between 60 and 65%.   She is receiving neoadjuvant chemotherapy with docetaxel/doxorubicin/cyclophosphamide (TAC) and had her 1st cycle on March 30th and has tolerated this fairly well.  She had worsening depression, being managed by her psychiatrist and Dr. Nyra Capes.  She reported insomnia so Dr. Nyra Capes had placed her on olanzapine 5 mg at bedtime.  The patient felt that this was too sedating, so we recommended she discuss this with Dr. Nyra Capes.  We had decreased her Provera to 10 mg daily, as her vaginal bleeding had improved.  She had a repeat echocardiogram on June 17th, which revealed normal left ventricular size and function with an ejection fraction of 60-65%, which was stable.  She completed 6 cycles of TAC chemotherapy in mid July.  CT chest, abdomen and pelvis in August did not reveal any evidence of metastatic disease.  Repeat MRI breast in August did not reveal any disease within the breast.  The left axillary lymph nodes had decreased by about 50%.  She underwent left mastectomy and axillary dissection on September 1st with Dr. Noberto Retort.  The final pathology did not reveal any malignancy within the breast.  9/26 lymph nodes  were positive for metastasis, the largest measuring 15 mm.  She is premenopausal.  She was referred for post mastectomy radiation.    INTERVAL HISTORY:  Laurie Simmons is here for routine follow up after completing adjuvant radiation on November 23rd.  She states that she handled treatment fairly well  other than fatigue.  She did develop post radiation changes of the skin following her treatment, but these have resolved.  She continues to have sporadic menstrual periods.  She is still wishing to undergo a hysterectomy, but was planning to discuss this with her plastic surgeon first.  She states that she still does not have full range of motion of her left upper extremity and has had persistent back pain following her left mastectomy.  We will refer her to physical therapy.  She rates her pain as a 9/10 when walking, but 2/10 otherwise.  Bone density scan from September was normal.  Blood counts and chemistries are unremarkable except for a BUN of 19, and a creatinine of 1.6, previously 1.1.  She has been placed on Voltaren 75 mg BID for her back, which is likely the cause, so I advised that she hold this medication.  Her baseline creatinine has been mildly elevated at 1.3.  I recommend that she use Tylenol or tramadol for her pain, unless absolutely necessary.  Her  appetite is good, and she has gained 17 pounds since her last visit.  She denies fever, chills or other signs of infection.  She denies nausea, vomiting, bowel issues, or abdominal pain.  She denies sore throat, cough, dyspnea, or chest pain.  Her husband accompanies her today.   REVIEW OF SYSTEMS:  Review of Systems  Constitutional: Positive for fatigue.  HENT:  Negative.   Eyes: Negative.   Respiratory: Negative.   Cardiovascular: Negative.   Gastrointestinal: Negative.   Endocrine: Negative.   Genitourinary: Negative.    Musculoskeletal: Positive for back pain (worse with ambulation).       Decreased range of motion of the left upper extremity  Skin: Negative.   Neurological: Negative.   Hematological: Negative.   Psychiatric/Behavioral: Negative.      VITALS:  Blood pressure (!) 160/93, pulse (!) 114, temperature 99.1 F (37.3 C), resp. rate 16, height '5\' 4"'  (1.626 m), weight 249 lb 14.4 oz (113.4 kg), SpO2 98 %.  Wt  Readings from Last 3 Encounters:  06/26/20 249 lb 14.4 oz (113.4 kg)  12/23/19 233 lb (105.7 kg)  06/24/19 230 lb (104.3 kg)    Body mass index is 42.9 kg/m.  Performance status (ECOG): 1 - Symptomatic but completely ambulatory  PHYSICAL EXAM:  Physical Exam Constitutional:      General: She is not in acute distress.    Appearance: Normal appearance. She is normal weight.  HENT:     Head: Normocephalic and atraumatic.  Eyes:     General: No scleral icterus.    Extraocular Movements: Extraocular movements intact.     Conjunctiva/sclera: Conjunctivae normal.     Pupils: Pupils are equal, round, and reactive to light.  Cardiovascular:     Rate and Rhythm: Regular rhythm. Tachycardia present.     Pulses: Normal pulses.     Heart sounds: Normal heart sounds. No murmur heard. No friction rub. No gallop.   Pulmonary:     Effort: Pulmonary effort is normal. No respiratory distress.     Breath sounds: Normal breath sounds.  Chest:  Breasts:     Right: Normal.     Left: Absent.  Comments: I can feel a nodule that is 1-2 cm in the center of the mastectomy scar.  Right breast is without masses.  Left mastectomy has post radiation changes which are slowly healing. Abdominal:     General: Bowel sounds are normal. There is no distension.     Palpations: Abdomen is soft. There is no mass.     Tenderness: There is no abdominal tenderness.  Musculoskeletal:        General: Normal range of motion.     Cervical back: Normal range of motion and neck supple.     Right lower leg: Edema present.     Left lower leg: Edema present.  Lymphadenopathy:     Cervical: No cervical adenopathy.  Skin:    General: Skin is warm and dry.  Neurological:     General: No focal deficit present.     Mental Status: She is alert and oriented to person, place, and time. Mental status is at baseline.  Psychiatric:        Mood and Affect: Mood normal.        Behavior: Behavior normal.        Thought  Content: Thought content normal.        Judgment: Judgment normal.     LABS:   CBC Latest Ref Rng & Units 06/26/2020 12/23/2018 10/22/2008  WBC - 6.5 9.8 12.3(H)  Hemoglobin 12.0 - 16.0 14.6 11.6 9.2 DELTA CHECK NOTED(L)  Hematocrit 36 - 46 43 36.4 27.4(L)  Platelets 150 - 399 300 - 228   CMP Latest Ref Rng & Units 06/26/2020 10/21/2008 10/20/2008  Glucose 70 - 99 mg/dL - 97 87  BUN 4 - 21 19 - 16  Creatinine 0.5 - 1.1 1.6(A) - 0.76  Sodium 137 - 147 140 - 135  Potassium 3.4 - 5.3 3.6 - 4.5  Chloride 99 - 108 106 - 106  CO2 13 - 22 23(A) - 22  Calcium 8.7 - 10.7 9.6 - 9.7  Total Protein 6.0 - 8.3 g/dL - - 6.0  Total Bilirubin 0.3 - 1.2 mg/dL - - 0.2(L)  Alkaline Phos 25 - 125 88 - 94  AST 13 - 35 24 - 24  ALT 7 - 35 21 - 19    Lab Results  Component Value Date   LDH 185 10/20/2008     STUDIES:   She underwent a DXA for bone mineral density on 04/07/2020 showing a completely normal exam.  Allergies:  Allergies  Allergen Reactions  . Nsaids Other (See Comments)    Pt reports kidney disease    Current Medications: Current Outpatient Medications  Medication Sig Dispense Refill  . albuterol (VENTOLIN HFA) 108 (90 Base) MCG/ACT inhaler Inhale two puffs every 4-6 hours if needed for cough or wheeze 18 g 1  . Azelastine HCl 0.15 % SOLN Can use one spray in each nostril two times daily if needed. 90 mL 1  . buPROPion (WELLBUTRIN XL) 150 MG 24 hr tablet Take 3 tablets by mouth in the morning. 90 tablet 2  . clorazepate (TRANXENE) 3.75 MG tablet Take one tablet up to four times daily as needed for anxiety/panic attacks. 120 tablet 2  . docusate sodium (COLACE) 50 MG capsule Take 50 mg by mouth 2 (two) times daily.    . famotidine (PEPCID) 40 MG tablet TAKE 1 TABLET BY MOUTH TWICE DAILY AS DIRECTED 60 tablet 5  . ferrous sulfate 324 MG TBEC Take 65 mg by mouth. Once daily    .  fluticasone (FLONASE) 50 MCG/ACT nasal spray     . folic acid (FOLVITE) 1 MG tablet Take 1 mg by mouth  daily.    Marland Kitchen ipratropium (ATROVENT) 0.06 % nasal spray Use 2 sprays in each nostril every 6 hours to dry up nose if needed 15 mL 5  . loratadine (CLARITIN) 10 MG tablet Take 10 mg by mouth daily.    . ondansetron (ZOFRAN) 4 MG tablet Take 4 mg by mouth every 4 (four) hours as needed.    . ondansetron (ZOFRAN-ODT) 4 MG disintegrating tablet Take 4 mg by mouth every 6 (six) hours as needed.    . potassium chloride (KLOR-CON) 10 MEQ tablet Take 10 mEq by mouth daily.    . prochlorperazine (COMPAZINE) 10 MG tablet Take 10 mg by mouth every 6 (six) hours as needed for nausea or vomiting.    . rosuvastatin (CRESTOR) 5 MG tablet TK 1 T PO 2 TIMES WEEKLY  1  . Semaglutide,0.25 or 0.5MG/DOS, (OZEMPIC, 0.25 OR 0.5 MG/DOSE,) 2 MG/1.5ML SOPN Inject 0.5 mg into the skin. Once weekly    . SYMBICORT 160-4.5 MCG/ACT inhaler INHALE 2 PUFFS BY MOUTH TWICE DAILY TO PREVENT COUGH OR WHEEZE. RINSE, GARGLE AND SPIT AFTER USE 10.2 g 2  . topiramate (TOPAMAX) 50 MG tablet Take 1 tablet (50 mg total) by mouth daily. 30 tablet 5  . UNABLE TO FIND Allergy injections for cats, grass, and dust.    . valsartan-hydrochlorothiazide (DIOVAN-HCT) 320-25 MG tablet Take 1 tablet by mouth daily.  0  . VIIBRYD 40 MG TABS Take 1 tablet (40 mg total) by mouth daily. 30 tablet 5   No current facility-administered medications for this visit.     ASSESSMENT & PLAN:   Assessment:   1. Clinical stage IIB (TXN2M0), grade 3, ductal carcinoma with no breast primary.  She received neoadjuvant chemotherapy with docetaxel/doxorubicin/cyclophosphamide completed in July.  She has now undergone left mastectomy and axillary dissection, with no breast primary found and 9/26 lymph nodes positive for metastasis. She completed adjuvant radiation at the end of November.  We now recommend hormonal therapy for at least 5 years and probably 10 years.  As she is premenopausal/perimenopausal, she would have to be on tamoxifen initially, but when she undergos  bilateral salpingo oophorectomy, we could consider an aromatase inhibitor.  Baseline bone density scan was completely normal.  2. Large uterine fibroid.  She is planned for a hysterectomy next year.  She continues to have sporadic menstrual cycles.  3. Iron deficiency anemia, resolved on oral supplement.  She was also placed on oral B12 supplement due to elevated methylmalonic acid.  She stopped her iron supplementation and has recurrent mild anemia with thrombocytosis, with evidence of recurrence.  I told her she could stop the iron and B12 supplements for now and we will recheck those later.  4. Chronic kidney disease, which has significantly worsened, likely secondary to anti-inflammatory medication.  I advised that she hold the Voltaren 75 mg BID, unless absolutely necessary.  She knows to push fluids.   5. Depression and anxiety, managed by Dr. Nyra Capes and her psychiatrist.  6. Mild hypokalemia, which has resolved with potassium chloride 20 mEq twice daily, but I have advised her to continue this since it was only 3.6 today.  7.  Persistent limited range of motion of the left upper extremity following her mastectomy.  We will refer her to physical therapy to evaluate and treat.  She is already undergoing physical therapy for her back  pain.  8.  Small hard nodule in the mid mastectomy incision.  I do not recall feeling this before and so I will order an ultrasound to evaluate this.    Plan: She completed adjuvant radiation at the end of November and is doing well.  There is a nodule measuring 1-2 cm in the left mastectomy scar, so we will obtain an ultrasound for further evaluation.  We will now plan for hormonal therapy with tamoxifen 20 mg daily initially as she is pre/perimenopausal.  She is planning on pursuing hysterectomy next year, at which time we could switch her to an aromatase inhibitor.  We reviewed the potential toxicities of this therapy today, and she is in agreement, so I will  send in the prescription today.  I advised that she start this at the end of December.  She knows to continue potassium twice daily.  We will plan to see her back in 6 weeks with CBC, comprehensive metabolic panel, iron studies, and B12 for reevaluation.  The patient and her husband understand the plans discussed today and are in agreement with them.  They know to contact our office if she develops issues requiring immediate clinical assessment.   I provided 25 minutes of face-to-face time during this this encounter and > 50% was spent counseling as documented under my assessment and plan.    Derwood Kaplan, MD [facility] AT [facility] [address] [place] [address] Dept: [phone] Dept Fax: [phone]   I, Rita Ohara, am acting as scribe for Derwood Kaplan, MD  I have reviewed this report as typed by the medical scribe, and it is complete and accurate.

## 2020-06-26 ENCOUNTER — Other Ambulatory Visit: Payer: Self-pay | Admitting: Oncology

## 2020-06-26 ENCOUNTER — Inpatient Hospital Stay: Payer: Medicare PPO | Attending: Oncology

## 2020-06-26 ENCOUNTER — Ambulatory Visit (INDEPENDENT_AMBULATORY_CARE_PROVIDER_SITE_OTHER): Payer: Medicare PPO | Admitting: Allergy and Immunology

## 2020-06-26 ENCOUNTER — Encounter: Payer: Self-pay | Admitting: Oncology

## 2020-06-26 ENCOUNTER — Inpatient Hospital Stay (INDEPENDENT_AMBULATORY_CARE_PROVIDER_SITE_OTHER): Payer: Medicare PPO | Admitting: Oncology

## 2020-06-26 ENCOUNTER — Telehealth: Payer: Self-pay | Admitting: Oncology

## 2020-06-26 ENCOUNTER — Encounter: Payer: Self-pay | Admitting: Allergy and Immunology

## 2020-06-26 ENCOUNTER — Other Ambulatory Visit: Payer: Self-pay

## 2020-06-26 ENCOUNTER — Other Ambulatory Visit: Payer: Self-pay | Admitting: Hematology and Oncology

## 2020-06-26 VITALS — BP 160/93 | HR 114 | Temp 99.1°F | Resp 16 | Ht 64.0 in | Wt 249.9 lb

## 2020-06-26 VITALS — BP 118/92 | HR 86 | Resp 14

## 2020-06-26 DIAGNOSIS — C773 Secondary and unspecified malignant neoplasm of axilla and upper limb lymph nodes: Secondary | ICD-10-CM

## 2020-06-26 DIAGNOSIS — K219 Gastro-esophageal reflux disease without esophagitis: Secondary | ICD-10-CM

## 2020-06-26 DIAGNOSIS — K224 Dyskinesia of esophagus: Secondary | ICD-10-CM | POA: Diagnosis not present

## 2020-06-26 DIAGNOSIS — D500 Iron deficiency anemia secondary to blood loss (chronic): Secondary | ICD-10-CM

## 2020-06-26 DIAGNOSIS — Z0001 Encounter for general adult medical examination with abnormal findings: Secondary | ICD-10-CM | POA: Diagnosis not present

## 2020-06-26 DIAGNOSIS — J4540 Moderate persistent asthma, uncomplicated: Secondary | ICD-10-CM

## 2020-06-26 DIAGNOSIS — J3089 Other allergic rhinitis: Secondary | ICD-10-CM | POA: Diagnosis not present

## 2020-06-26 DIAGNOSIS — D649 Anemia, unspecified: Secondary | ICD-10-CM | POA: Diagnosis not present

## 2020-06-26 LAB — HEPATIC FUNCTION PANEL
ALT: 21 (ref 7–35)
AST: 24 (ref 13–35)
Alkaline Phosphatase: 88 (ref 25–125)
Bilirubin, Total: 0.4

## 2020-06-26 LAB — CBC AND DIFFERENTIAL
HCT: 43 (ref 36–46)
Hemoglobin: 14.6 (ref 12.0–16.0)
Neutrophils Absolute: 4.75
Platelets: 300 (ref 150–399)
WBC: 6.5

## 2020-06-26 LAB — BASIC METABOLIC PANEL
BUN: 19 (ref 4–21)
CO2: 23 — AB (ref 13–22)
Chloride: 106 (ref 99–108)
Creatinine: 1.6 — AB (ref 0.5–1.1)
Glucose: 126
Potassium: 3.6 (ref 3.4–5.3)
Sodium: 140 (ref 137–147)

## 2020-06-26 LAB — IRON,TIBC AND FERRITIN PANEL
%SAT: 21.1
Iron: 69
TIBC: 327

## 2020-06-26 LAB — CBC: RBC: 5.31 — AB (ref 3.87–5.11)

## 2020-06-26 LAB — COMPREHENSIVE METABOLIC PANEL
Albumin: 4.4 (ref 3.5–5.0)
Calcium: 9.6 (ref 8.7–10.7)

## 2020-06-26 MED ORDER — TAMOXIFEN CITRATE 20 MG PO TABS: 20.0000 mg | ORAL_TABLET | Freq: Every day | ORAL | 5 refills | Status: DC

## 2020-06-26 NOTE — Progress Notes (Signed)
[place] [facility] [place] [facility] [place]   Follow-up Note  Referring Provider: No ref. provider found Primary Provider: Marco Collie, MD Date of Office Visit: 06/26/2020  Subjective:   Laurie Simmons (DOB: 05/26/1966) is a 54 y.o. female who returns to the [facility] [place] on 06/26/2020 in re-evaluation of the following:  HPI: Laurie Simmons presents to this clinic in evaluation of asthma and allergic rhinoconjunctivitis and reflux.  Her last visit to this clinic was 29 May 2020.  During her last visit she was acutely ill and was having significant shortness of breath that did not really respond to a short acting bronchodilator and she also had lots of mucus stuck in her throat and lots of throat clearing and her reflux was out of control.  We assigned a plan to address these issues and she is much better.  She no longer has any shortness of breath and her throat has not been causing her any problem.  She has eliminated most chocolate consumption and has continued on therapy prescribed during her last visit which address both respiratory tract inflammation and reflux.  She has been consistently using her Symbicort and intermittently using her nasal sprays and continues on famotidine consistently.  However, she has noticed over the course of the past few weeks that she has had a little bit more regurgitation.  As well, she has had some food "stuck in her throat".  Apparently she can eat apples and some other foods and for 30 seconds this food will get hung up in her chest and then finally it passes.  She does have a history of requiring a esophageal dilation in the past.  She has seen Dr. Melina Copa about 5 years ago and prior to that Dr. Lyndel Safe for reflux issues.  Allergies as of 06/26/2020      Reactions   Nsaids Other (See Comments)   Pt reports kidney disease      Medication List    albuterol 108 (90 Base) MCG/ACT inhaler Commonly known as: VENTOLIN HFA Inhale two  puffs every 4-6 hours if needed for cough or wheeze   Azelastine HCl 0.15 % Soln Can use one spray in each nostril two times daily if needed.   buPROPion 150 MG 24 hr tablet Commonly known as: WELLBUTRIN XL Take 3 tablets by mouth in the morning.   clorazepate 3.75 MG tablet Commonly known as: TRANXENE Take one tablet up to four times daily as needed for anxiety/panic attacks.   docusate sodium 50 MG capsule Commonly known as: COLACE Take 50 mg by mouth 2 (two) times daily.   famotidine 40 MG tablet Commonly known as: PEPCID TAKE 1 TABLET BY MOUTH TWICE DAILY AS DIRECTED   ferrous sulfate 324 MG Tbec Take 65 mg by mouth. Once daily   fluticasone 50 MCG/ACT nasal spray Commonly known as: FLONASE   folic acid 1 MG tablet Commonly known as: FOLVITE Take 1 mg by mouth daily.   ipratropium 0.06 % nasal spray Commonly known as: ATROVENT Use 2 sprays in each nostril every 6 hours to dry up nose if needed   loratadine 10 MG tablet Commonly known as: CLARITIN Take 10 mg by mouth daily.   ondansetron 4 MG disintegrating tablet Commonly known as: ZOFRAN-ODT Take 4 mg by mouth every 6 (six) hours as needed.   ondansetron 4 MG tablet Commonly known as: ZOFRAN Take 4 mg by mouth every 4 (four) hours as needed.   Ozempic (0.25 or  0.5 MG/DOSE) 2 MG/1.5ML Sopn Generic drug: Semaglutide(0.25 or 0.5MG /DOS) Inject 0.5 mg into the skin. Once weekly   potassium chloride 10 MEQ tablet Commonly known as: KLOR-CON Take 10 mEq by mouth daily.   prochlorperazine 10 MG tablet Commonly known as: COMPAZINE Take 10 mg by mouth every 6 (six) hours as needed for nausea or vomiting.   rosuvastatin 5 MG tablet Commonly known as: CRESTOR TK 1 T PO 2 TIMES WEEKLY   Symbicort 160-4.5 MCG/ACT inhaler Generic drug: budesonide-formoterol INHALE 2 PUFFS BY MOUTH TWICE DAILY TO PREVENT COUGH OR WHEEZE. RINSE, GARGLE AND SPIT AFTER USE   topiramate 50 MG tablet Commonly known as:  TOPAMAX Take 1 tablet (50 mg total) by mouth daily.   UNABLE TO FIND Allergy injections for cats, grass, and dust.   valsartan-hydrochlorothiazide 320-25 MG tablet Commonly known as: DIOVAN-HCT Take 1 tablet by mouth daily.   Viibryd 40 MG Tabs Generic drug: Vilazodone HCl Take 1 tablet (40 mg total) by mouth daily.       Past Medical History:  Diagnosis Date  . Anxiety   . Arthritis   . Asthma   . Breast cancer ([place]) 2021  . Carpal tunnel syndrome   . Carpal tunnel syndrome    bilateral  . Chronic kidney disease    stage 3  . Depression   . Diabetes ([place])   . GERD (gastroesophageal reflux disease)   . Hypertension   . Irritable bowel syndrome   . Severe anemia [id number]    Past Surgical History:  Procedure Laterality Date  . caesarean    . CHOLECYSTECTOMY    . DILATION AND CURETTAGE OF UTERUS    . HIP SURGERY Left   . PORT A CATH INJECTION ([place] HX)  2021    Review of systems negative except as noted in HPI / PMHx or noted below:  Review of Systems  Constitutional: Negative.   HENT: Negative.   Eyes: Negative.   Respiratory: Negative.   Cardiovascular: Negative.   Gastrointestinal: Negative.   Genitourinary: Negative.   Musculoskeletal: Negative.   Skin: Negative.   Neurological: Negative.   Endo/Heme/Allergies: Negative.   Psychiatric/Behavioral: Negative.      Objective:   Vitals:   06/26/20 1008  BP: (!) 118/92  Pulse: 86  Resp: 14  SpO2: 97%          Physical Exam Constitutional:      Appearance: She is not diaphoretic.  HENT:     Head: Normocephalic.     Right Ear: Tympanic membrane, ear canal and external ear normal.     Left Ear: Tympanic membrane, ear canal and external ear normal.     Nose: Nose normal. No mucosal edema or rhinorrhea.     Mouth/Throat:     Mouth: Oropharynx is clear and moist and mucous membranes are normal.     Pharynx: Uvula midline. No oropharyngeal exudate.  Eyes:     Conjunctiva/sclera: Conjunctivae  normal.  Neck:     Thyroid: No thyromegaly.     Trachea: Trachea normal. No tracheal tenderness or tracheal deviation.  Cardiovascular:     Rate and Rhythm: Normal rate and regular rhythm.     Heart sounds: Normal heart sounds, S1 normal and S2 normal. No murmur heard.   Pulmonary:     Effort: No respiratory distress.     Breath sounds: Normal breath sounds. No stridor. No wheezing or rales.  Musculoskeletal:        General: No edema.  Lymphadenopathy:  Head:     Right side of head: No tonsillar adenopathy.     Left side of head: No tonsillar adenopathy.     Cervical: No cervical adenopathy.  Skin:    Findings: No erythema or rash.     Nails: There is no clubbing.  Neurological:     Mental Status: She is alert.     Diagnostics:   Results of a chest x-ray obtained 05 June 2020 identified no significant cardiopulmonary abnormality  Assessment and Plan:   1. Asthma, moderate persistent, well-controlled   2. Other allergic rhinitis   3. LPRD (laryngopharyngeal reflux disease)   4. Esophageal dysmotility     1. Continue Symbicort 160 2 inhalations 2 times per day    2.  Continue famotidine 40 mg - 1 tablet 2 times per day  3.  Continue immunotherapy and EpiPen  4.  If needed:   A. Ventolin  B. OTC antihistamime  C. Nasal saline  D. Azelastine 1 spray each nostril 2 times per day  E. Nasacort - 1 spray each nostril 2 times per day  5. Obtain a barium swallow for esophageal dysmotility  6. Further evaluation and treatment with gastroenterologist???  7. Return to clinic in 6 months or earlier if problem   Although Allianna's airway is doing okay and her reflux was under very good control she is developing issues with esophageal dysmotility which we need to further investigate with a barium swallow and subsequent referral to gastroenterologist at some point.  She will continue on Symbicort and famotidine to address respiratory tract inflammation and reflux.   Because of her kidney issue she is attempting to remain away from consumption of proton pump inhibitors.  I will contact her when the results of her barium swallow are available for review.  Allena Katz, MD Allergy / Immunology [place]

## 2020-06-26 NOTE — Patient Instructions (Addendum)
  1. Continue Symbicort 160 2 inhalations 2 times per day    2.  Continue famotidine 40 mg - 1 tablet 2 times per day  3.  Continue immunotherapy and EpiPen  4.  If needed:   A. Ventolin  B. OTC antihistamime  C. Nasal saline  D. Azelastine 1 spray each nostril 2 times per day  E. Nasacort - 1 spray each nostril 2 times per day  5. Obtain a barium swallow for esophageal dysmotility  6. Further evaluation and treatment with gastroenterologist???  7. Return to clinic in 6 months or earlier if problem

## 2020-06-26 NOTE — Telephone Encounter (Signed)
Per 12/13 los next appt given to patient 

## 2020-06-27 ENCOUNTER — Telehealth: Payer: Self-pay | Admitting: Oncology

## 2020-06-27 ENCOUNTER — Encounter: Payer: Self-pay | Admitting: Allergy and Immunology

## 2020-06-27 ENCOUNTER — Telehealth: Payer: Self-pay

## 2020-06-27 NOTE — Telephone Encounter (Signed)
Spoke with patient about ultrasound appt sched on 12/17@10am 

## 2020-06-27 NOTE — Telephone Encounter (Signed)
Faxed order for Barium Swallow, along with demographics and last office notes to [facility].  I asked them to let us know when patient has been scheduled.  No PA required per representative at [facility].

## 2020-06-28 DIAGNOSIS — M2560 Stiffness of unspecified joint, not elsewhere classified: Secondary | ICD-10-CM | POA: Diagnosis not present

## 2020-06-28 DIAGNOSIS — M461 Sacroiliitis, not elsewhere classified: Secondary | ICD-10-CM | POA: Diagnosis not present

## 2020-06-28 DIAGNOSIS — M545 Low back pain, unspecified: Secondary | ICD-10-CM | POA: Diagnosis not present

## 2020-06-28 DIAGNOSIS — J020 Streptococcal pharyngitis: Secondary | ICD-10-CM | POA: Diagnosis not present

## 2020-06-28 DIAGNOSIS — Z20822 Contact with and (suspected) exposure to covid-19: Secondary | ICD-10-CM | POA: Diagnosis not present

## 2020-06-28 DIAGNOSIS — R262 Difficulty in walking, not elsewhere classified: Secondary | ICD-10-CM | POA: Diagnosis not present

## 2020-07-03 DIAGNOSIS — R928 Other abnormal and inconclusive findings on diagnostic imaging of breast: Secondary | ICD-10-CM | POA: Diagnosis not present

## 2020-07-03 DIAGNOSIS — C50912 Malignant neoplasm of unspecified site of left female breast: Secondary | ICD-10-CM | POA: Diagnosis not present

## 2020-07-03 DIAGNOSIS — R9389 Abnormal findings on diagnostic imaging of other specified body structures: Secondary | ICD-10-CM | POA: Diagnosis not present

## 2020-07-04 ENCOUNTER — Ambulatory Visit (INDEPENDENT_AMBULATORY_CARE_PROVIDER_SITE_OTHER): Payer: Medicare PPO | Admitting: *Deleted

## 2020-07-04 DIAGNOSIS — M461 Sacroiliitis, not elsewhere classified: Secondary | ICD-10-CM | POA: Diagnosis not present

## 2020-07-04 DIAGNOSIS — M2560 Stiffness of unspecified joint, not elsewhere classified: Secondary | ICD-10-CM | POA: Diagnosis not present

## 2020-07-04 DIAGNOSIS — J309 Allergic rhinitis, unspecified: Secondary | ICD-10-CM | POA: Diagnosis not present

## 2020-07-04 DIAGNOSIS — K224 Dyskinesia of esophagus: Secondary | ICD-10-CM | POA: Diagnosis not present

## 2020-07-04 DIAGNOSIS — M545 Low back pain, unspecified: Secondary | ICD-10-CM | POA: Diagnosis not present

## 2020-07-04 DIAGNOSIS — R1310 Dysphagia, unspecified: Secondary | ICD-10-CM | POA: Diagnosis not present

## 2020-07-04 DIAGNOSIS — R262 Difficulty in walking, not elsewhere classified: Secondary | ICD-10-CM | POA: Diagnosis not present

## 2020-07-06 DIAGNOSIS — M461 Sacroiliitis, not elsewhere classified: Secondary | ICD-10-CM | POA: Diagnosis not present

## 2020-07-06 DIAGNOSIS — M2560 Stiffness of unspecified joint, not elsewhere classified: Secondary | ICD-10-CM | POA: Diagnosis not present

## 2020-07-06 DIAGNOSIS — M545 Low back pain, unspecified: Secondary | ICD-10-CM | POA: Diagnosis not present

## 2020-07-06 DIAGNOSIS — R262 Difficulty in walking, not elsewhere classified: Secondary | ICD-10-CM | POA: Diagnosis not present

## 2020-07-10 ENCOUNTER — Encounter: Payer: Self-pay | Admitting: Oncology

## 2020-07-17 DIAGNOSIS — M2560 Stiffness of unspecified joint, not elsewhere classified: Secondary | ICD-10-CM | POA: Diagnosis not present

## 2020-07-17 DIAGNOSIS — M461 Sacroiliitis, not elsewhere classified: Secondary | ICD-10-CM | POA: Diagnosis not present

## 2020-07-17 DIAGNOSIS — Z9012 Acquired absence of left breast and nipple: Secondary | ICD-10-CM | POA: Diagnosis not present

## 2020-07-17 DIAGNOSIS — R262 Difficulty in walking, not elsewhere classified: Secondary | ICD-10-CM | POA: Diagnosis not present

## 2020-07-17 DIAGNOSIS — M25612 Stiffness of left shoulder, not elsewhere classified: Secondary | ICD-10-CM | POA: Diagnosis not present

## 2020-07-17 DIAGNOSIS — M545 Low back pain, unspecified: Secondary | ICD-10-CM | POA: Diagnosis not present

## 2020-07-17 DIAGNOSIS — M25512 Pain in left shoulder: Secondary | ICD-10-CM | POA: Diagnosis not present

## 2020-07-19 ENCOUNTER — Telehealth: Payer: Self-pay | Admitting: Oncology

## 2020-07-19 ENCOUNTER — Ambulatory Visit (INDEPENDENT_AMBULATORY_CARE_PROVIDER_SITE_OTHER): Payer: Medicare PPO

## 2020-07-19 DIAGNOSIS — R262 Difficulty in walking, not elsewhere classified: Secondary | ICD-10-CM | POA: Diagnosis not present

## 2020-07-19 DIAGNOSIS — M461 Sacroiliitis, not elsewhere classified: Secondary | ICD-10-CM | POA: Diagnosis not present

## 2020-07-19 DIAGNOSIS — M545 Low back pain, unspecified: Secondary | ICD-10-CM | POA: Diagnosis not present

## 2020-07-19 DIAGNOSIS — J309 Allergic rhinitis, unspecified: Secondary | ICD-10-CM

## 2020-07-19 DIAGNOSIS — M2560 Stiffness of unspecified joint, not elsewhere classified: Secondary | ICD-10-CM | POA: Diagnosis not present

## 2020-07-19 NOTE — Telephone Encounter (Signed)
07/19/20 Left vm with patient about upcoming appts

## 2020-07-25 DIAGNOSIS — M2560 Stiffness of unspecified joint, not elsewhere classified: Secondary | ICD-10-CM | POA: Diagnosis not present

## 2020-07-25 DIAGNOSIS — M461 Sacroiliitis, not elsewhere classified: Secondary | ICD-10-CM | POA: Diagnosis not present

## 2020-07-25 DIAGNOSIS — M25612 Stiffness of left shoulder, not elsewhere classified: Secondary | ICD-10-CM | POA: Diagnosis not present

## 2020-07-25 DIAGNOSIS — M25512 Pain in left shoulder: Secondary | ICD-10-CM | POA: Diagnosis not present

## 2020-07-25 DIAGNOSIS — Z9012 Acquired absence of left breast and nipple: Secondary | ICD-10-CM | POA: Diagnosis not present

## 2020-07-25 DIAGNOSIS — R262 Difficulty in walking, not elsewhere classified: Secondary | ICD-10-CM | POA: Diagnosis not present

## 2020-07-25 DIAGNOSIS — M545 Low back pain, unspecified: Secondary | ICD-10-CM | POA: Diagnosis not present

## 2020-07-28 DIAGNOSIS — M2560 Stiffness of unspecified joint, not elsewhere classified: Secondary | ICD-10-CM | POA: Diagnosis not present

## 2020-07-28 DIAGNOSIS — M461 Sacroiliitis, not elsewhere classified: Secondary | ICD-10-CM | POA: Diagnosis not present

## 2020-07-28 DIAGNOSIS — M545 Low back pain, unspecified: Secondary | ICD-10-CM | POA: Diagnosis not present

## 2020-07-28 DIAGNOSIS — Z9012 Acquired absence of left breast and nipple: Secondary | ICD-10-CM | POA: Diagnosis not present

## 2020-07-28 DIAGNOSIS — M25512 Pain in left shoulder: Secondary | ICD-10-CM | POA: Diagnosis not present

## 2020-07-28 DIAGNOSIS — R262 Difficulty in walking, not elsewhere classified: Secondary | ICD-10-CM | POA: Diagnosis not present

## 2020-07-28 DIAGNOSIS — M25612 Stiffness of left shoulder, not elsewhere classified: Secondary | ICD-10-CM | POA: Diagnosis not present

## 2020-08-01 ENCOUNTER — Other Ambulatory Visit: Payer: Self-pay | Admitting: Hematology and Oncology

## 2020-08-01 DIAGNOSIS — M25612 Stiffness of left shoulder, not elsewhere classified: Secondary | ICD-10-CM | POA: Diagnosis not present

## 2020-08-01 DIAGNOSIS — R110 Nausea: Secondary | ICD-10-CM

## 2020-08-01 DIAGNOSIS — M25512 Pain in left shoulder: Secondary | ICD-10-CM | POA: Diagnosis not present

## 2020-08-01 DIAGNOSIS — Z9012 Acquired absence of left breast and nipple: Secondary | ICD-10-CM | POA: Diagnosis not present

## 2020-08-01 DIAGNOSIS — R262 Difficulty in walking, not elsewhere classified: Secondary | ICD-10-CM | POA: Diagnosis not present

## 2020-08-01 DIAGNOSIS — M2560 Stiffness of unspecified joint, not elsewhere classified: Secondary | ICD-10-CM | POA: Diagnosis not present

## 2020-08-01 DIAGNOSIS — M461 Sacroiliitis, not elsewhere classified: Secondary | ICD-10-CM | POA: Diagnosis not present

## 2020-08-01 DIAGNOSIS — M545 Low back pain, unspecified: Secondary | ICD-10-CM | POA: Diagnosis not present

## 2020-08-01 NOTE — Telephone Encounter (Signed)
sent 

## 2020-08-07 NOTE — Progress Notes (Signed)
[facility]  [address] [place],  [place]  [address] [phone]  Clinic Day:  08/09/2020  Referring physician: Hodges, Beth, MD   This document serves as a record of services personally performed by  , MD. It was created on their behalf by Curry,Lauren E, a trained medical scribe. The creation of this record is based on the scribe's personal observations and the provider's statements to them.   CHIEF COMPLAINT:  CC: Clinical stage IIB hormone receptor positive left breast cancer  Current Treatment:  Tamoxifen 20 mg daily starting at the end of December  HISTORY OF PRESENT ILLNESS:  Laurie Simmons is a 55 y.o. female with clinical stage IIB (TX N2 M0) hormone receptor positive left breast cancer with left axillary adenopathy, but no obvious breast primary diagnosed in February.  She developed left axillary lymphadenopathy in December 2020 which did not improve with antibiotics.  Bilateral diagnostic mammogram and left breast ultrasound in January revealed 4 enlarged lymph nodes in the left axilla with the largest measuring 5.5 cm.  Ultrasound guided biopsy in February revealed poorly differentiated carcinoma.  Immunophenotype is consistent with metastatic carcinoma and with GATA-3 positivity, which is most consistent with metastatic breast carcinoma.  Estrogen receptors were positive at 95% and progesterone receptors were positive at 50.  HER2 was negative.  Ki67 was 20%.  Bilateral breast MRI in March did not reveal any evidence of primary malignancy.  It also revealed left axillary lymphadenopathy with 6 enlarged lymph nodes, the largest measuring 3 x 6 cm.  She has been seen by Dr. Lininger to discuss mastectomy and lymph node dissection versus breast conservation.  She had been scheduled for a hysterectomy due to a large uterine fibroid measuring 9.8 cm, as well as uterine enlargement with Dr. Richardson around the time of her diagnosis, so this  has now been put on hold.  She had been on Provera 10 mg daily.  Provera was subsequently increased to 20 mg daily due to persistent heavy vaginal bleeding.  MRI brain did not reveal any metastatic disease.  PET scan revealed enlarged hypermetabolic left subpectoral and axillary adenopathy, with an index node measuring 3.3 cm in short axis with maximum SUV 6.7.  No other hypermetabolic activity was observed.  Echocardiogram revealed ejection fraction between 60 and 65%.   She is receiving neoadjuvant chemotherapy with docetaxel/doxorubicin/cyclophosphamide (TAC) and had her 1st cycle on March 30th and has tolerated this fairly well.  She had worsening depression, being managed by her psychiatrist and Dr. Hodges.  She reported insomnia so Dr. Hodges had placed her on olanzapine 5 mg at bedtime.  The patient felt that this was too sedating, so we recommended she discuss this with Dr. Hodges.  We had decreased her Provera to 10 mg daily, as her vaginal bleeding had improved.  She had a repeat echocardiogram on June 17th, which revealed normal left ventricular size and function with an ejection fraction of 60-65%, which was stable.  She completed 6 cycles of TAC chemotherapy in mid July.  CT chest, abdomen and pelvis in August did not reveal any evidence of metastatic disease.  Repeat MRI breast in August did not reveal any disease within the breast.  The left axillary lymph nodes had decreased by about 50%.  She underwent left mastectomy and axillary dissection on September 1st with Dr. Lininger.  The final pathology did not reveal any malignancy within the breast.  9/26 lymph nodes were positive for metastasis, the largest   measuring 15 mm.  She is premenopausal.  She was referred for post mastectomy radiation and completed this on November 23rd.  She was taking NSAID's for her back pain but I had her stop that last month due to a rise in the creatinine to 1.6.  Her baseline creatinine has been mildly elevated at  1.3.  Bone density scan from September was normal.  At the end of December we had her start tamoxifen.    INTERVAL HISTORY:  Shequila is here for routine follow up and started tamoxifen daily at the end of December.  She did have diarrhea after starting the medication, but this resolved.  She also notes some GI symptoms of abdominal cramping, and gas.  She does have IBS.  She has also been having weird dreams, headaches, mild dizziness, hot flashes, some insomnia, and mild mood swings.  She also notes mild nausea, and I advised that she take this with food.  She is on Wellbutrin 450 mg daily, which can interact with tamoxifen.  I advised that we switch her to Effexor.  She is scheduled with her psychologist tomorrow, and will discuss this with her.  She is scheduled for follow up and mammogram with Dr. Noberto Retort on January 28th.  She is also scheduled for consultation with a plastic surgeon next week to discuss reconstruction.  We also later plan bilateral salpingo oophorectomy.  Blood counts and chemistries are unremarkable except for a BUN of 21, and a creatinine of 1.4, improved from 1.6.  She knows to push fluid and continue oral potassium as prescribed.  Her  appetite is good, but she has lost nearly 8 pounds since her last visit, but had gained a great deal of weight at the end of 2021.  She denies fever, chills or other signs of infection.  She denies nausea, vomiting, bowel issues, or abdominal pain today.  She denies sore throat, cough, dyspnea, or chest pain.  REVIEW OF SYSTEMS:  Review of Systems  Constitutional: Negative.   HENT:  Negative.   Eyes: Negative.   Respiratory: Negative.   Cardiovascular: Negative.   Gastrointestinal: Positive for abdominal pain (and cramping) and nausea (mild, only in the mornings).  Endocrine: Positive for hot flashes (intermittent).  Genitourinary: Negative.    Musculoskeletal: Negative.   Skin: Negative.   Neurological: Positive for dizziness (occasional) and  headaches.  Hematological: Negative.   Psychiatric/Behavioral: Positive for sleep disturbance (insomnia and weird dreams).     VITALS:  Blood pressure (!) 166/99, pulse (!) 103, temperature 98.4 F (36.9 C), temperature source Oral, resp. rate 18, height 5' 1" (1.549 m), weight 242 lb 1.6 oz (109.8 kg), SpO2 96 %.  Wt Readings from Last 3 Encounters:  08/09/20 242 lb 1.6 oz (109.8 kg)  06/26/20 249 lb 14.4 oz (113.4 kg)  12/23/19 233 lb (105.7 kg)    Body mass index is 45.74 kg/m.  Performance status (ECOG): 1 - Symptomatic but completely ambulatory  PHYSICAL EXAM:  Physical Exam Constitutional:      General: She is not in acute distress.    Appearance: Normal appearance. She is normal weight.  HENT:     Head: Normocephalic and atraumatic.  Eyes:     General: No scleral icterus.    Extraocular Movements: Extraocular movements intact.     Conjunctiva/sclera: Conjunctivae normal.     Pupils: Pupils are equal, round, and reactive to light.  Cardiovascular:     Rate and Rhythm: Normal rate and regular rhythm.  Pulses: Normal pulses.     Heart sounds: Normal heart sounds. No murmur heard. No friction rub. No gallop.   Pulmonary:     Effort: Pulmonary effort is normal. No respiratory distress.     Breath sounds: Normal breath sounds.  Chest:  Breasts:     Right: Normal.     Left: Absent.      Comments: Right breast is without masses and left mastectomy has a 1-2 cm nodule with some firmness just below that and some firmness superior to that area as well.  This seems stable since her last examination. Abdominal:     General: Bowel sounds are normal. There is no distension.     Palpations: Abdomen is soft. There is no mass.     Tenderness: There is no abdominal tenderness.  Musculoskeletal:        General: Normal range of motion.     Cervical back: Normal range of motion and neck supple.     Right lower leg: No edema.     Left lower leg: No edema.  Lymphadenopathy:      Cervical: No cervical adenopathy.  Skin:    General: Skin is warm and dry.  Neurological:     General: No focal deficit present.     Mental Status: She is alert and oriented to person, place, and time. Mental status is at baseline.  Psychiatric:        Mood and Affect: Mood normal.        Behavior: Behavior normal.        Thought Content: Thought content normal.        Judgment: Judgment normal.     LABS:   CBC Latest Ref Rng & Units 06/26/2020 12/23/2018 10/22/2008  WBC - 6.5 9.8 12.3(H)  Hemoglobin 12.0 - 16.0 14.6 11.6 9.2 DELTA CHECK NOTED(L)  Hematocrit 36 - 46 43 36.4 27.4(L)  Platelets 150 - 399 300 - 228   CMP Latest Ref Rng & Units 06/26/2020 10/21/2008 10/20/2008  Glucose 70 - 99 mg/dL - 97 87  BUN 4 - 21 19 - 16  Creatinine 0.5 - 1.1 1.6(A) - 0.76  Sodium 137 - 147 140 - 135  Potassium 3.4 - 5.3 3.6 - 4.5  Chloride 99 - 108 106 - 106  CO2 13 - 22 23(A) - 22  Calcium 8.7 - 10.7 9.6 - 9.7  Total Protein 6.0 - 8.3 g/dL - - 6.0  Total Bilirubin 0.3 - 1.2 mg/dL - - 0.2(L)  Alkaline Phos 25 - 125 88 - 94  AST 13 - 35 24 - 24  ALT 7 - 35 21 - 19    Lab Results  Component Value Date   LDH 185 10/20/2008     STUDIES:   No current studies.  Allergies:  Allergies  Allergen Reactions  . Nsaids Other (See Comments)    Pt reports kidney disease    Current Medications: Current Outpatient Medications  Medication Sig Dispense Refill  . albuterol (VENTOLIN HFA) 108 (90 Base) MCG/ACT inhaler Inhale two puffs every 4-6 hours if needed for cough or wheeze 18 g 1  . Azelastine HCl 0.15 % SOLN Can use one spray in each nostril two times daily if needed. 90 mL 1  . buPROPion (WELLBUTRIN XL) 150 MG 24 hr tablet Take 3 tablets by mouth in the morning. 90 tablet 2  . clorazepate (TRANXENE) 3.75 MG tablet Take one tablet up to four times daily as needed for anxiety/panic attacks. 120 tablet  2  . docusate sodium (COLACE) 50 MG capsule Take 50 mg by mouth 2 (two) times daily.     . famotidine (PEPCID) 40 MG tablet TAKE 1 TABLET BY MOUTH TWICE DAILY AS DIRECTED 60 tablet 5  . ferrous sulfate 324 MG TBEC Take 65 mg by mouth. Once daily    . fluticasone (FLONASE) 50 MCG/ACT nasal spray     . folic acid (FOLVITE) 1 MG tablet Take 1 mg by mouth daily.    . ipratropium (ATROVENT) 0.06 % nasal spray Use 2 sprays in each nostril every 6 hours to dry up nose if needed 15 mL 5  . loratadine (CLARITIN) 10 MG tablet Take 10 mg by mouth daily.    . ondansetron (ZOFRAN) 4 MG tablet Take 4 mg by mouth every 4 (four) hours as needed.    . ondansetron (ZOFRAN-ODT) 4 MG disintegrating tablet Take 4 mg by mouth every 6 (six) hours as needed.    . potassium chloride (KLOR-CON) 10 MEQ tablet Take 10 mEq by mouth daily.    . prochlorperazine (COMPAZINE) 10 MG tablet TAKE 1 TABLET BY MOUTH EVERY 6 HOURS AS NEEDED FOR NAUSEA 30 tablet 1  . rosuvastatin (CRESTOR) 5 MG tablet TK 1 T PO 2 TIMES WEEKLY  1  . Semaglutide,0.25 or 0.5MG/DOS, (OZEMPIC, 0.25 OR 0.5 MG/DOSE,) 2 MG/1.5ML SOPN Inject 0.5 mg into the skin. Once weekly    . SYMBICORT 160-4.5 MCG/ACT inhaler INHALE 2 PUFFS BY MOUTH TWICE DAILY TO PREVENT COUGH OR WHEEZE. RINSE, GARGLE AND SPIT AFTER USE 10.2 g 2  . tamoxifen (NOLVADEX) 20 MG tablet Take 1 tablet (20 mg total) by mouth daily. 30 tablet 5  . topiramate (TOPAMAX) 50 MG tablet Take 1 tablet (50 mg total) by mouth daily. 30 tablet 5  . UNABLE TO FIND Allergy injections for cats, grass, and dust.    . valsartan-hydrochlorothiazide (DIOVAN-HCT) 320-25 MG tablet Take 1 tablet by mouth daily.  0  . VIIBRYD 40 MG TABS Take 1 tablet (40 mg total) by mouth daily. 30 tablet 5   No current facility-administered medications for this visit.     ASSESSMENT & PLAN:   Assessment:   1. Clinical stage IIB (TXN2M0), grade 3, ductal carcinoma with no breast primary.  She received neoadjuvant chemotherapy with docetaxel/doxorubicin/cyclophosphamide completed in July.  She has now undergone  left mastectomy and axillary dissection, with no breast primary found and 9/26 lymph nodes positive for metastasis. She completed adjuvant radiation at the end of November, and was placed on hormonal therapy with tamoxifen at the end of December.  When she undergoes bilateral salpingo oophorectomy, we could consider an aromatase inhibitor.  Baseline bone density scan was completely normal.  2. Large uterine fibroid.  She is planned for a hysterectomy/BSO this year.  She continues to have sporadic menstrual cycles.  3. Chronic kidney disease, which had significantly worsened, likely secondary to anti-inflammatory medication.  I advised that she stop the Voltaren.  She knows to push fluids as her BUN is still mildly elevated at 21.  This has now improved.   4. Depression and anxiety, managed by Dr. Hodges and her psychiatrist.  The tamoxifen can be lowered by most anti-depressant medications including the Wellbutrin.  We will suggest that she try Effexor if her psychologist agrees.    5.  Small hard nodule in the mid mastectomy incision, stable.  We will have Dr. Lininger examine this as well  Plan: She started hormonal therapy with tamoxifen 20 mg   daily at the end of December 2021 and has tolerated this with some expected toxicities.  This includes headache, hot flashes, mild mood swings, insomnia and weird dreams.  Her GI symptoms are more likely related to her IBS but could be exacerbated by the medication.  She is currently on Wellbutrin 450 mg daily, and this can interact with tamoxifen.  I advise that she switch to Effexor starting at a lower dose and increasing as tolerated.  She plans to discuss this with her psychologist tomorrow.  She is planning on pursuing hysterectomy/BSO this year, at which time we could switch her to an aromatase inhibitor, but she is concerned about the potential arthralgias associated with that.  She is also pursuing breast reconstruction surgery.  She knows to continue  potassium twice daily.  We will plan to see her back in 3 months with CBC and comprehensive metabolic panel for reevaluation.  The patient and her husband understand the plans discussed today and are in agreement with them.  They know to contact our office if she develops issues requiring immediate clinical assessment.   I provided 35 minutes of face-to-face time during this this encounter and > 50% was spent counseling as documented under my assessment and plan.     H , MD [facility]  [facility] [address] [place] [address] Dept: [phone] Dept Fax: [phone]   I, Lauren Curry, am acting as scribe for  H. , MD  I have reviewed this report as typed by the medical scribe, and it is complete and accurate.      

## 2020-08-08 DIAGNOSIS — M2560 Stiffness of unspecified joint, not elsewhere classified: Secondary | ICD-10-CM | POA: Diagnosis not present

## 2020-08-08 DIAGNOSIS — M545 Low back pain, unspecified: Secondary | ICD-10-CM | POA: Diagnosis not present

## 2020-08-08 DIAGNOSIS — M25512 Pain in left shoulder: Secondary | ICD-10-CM | POA: Diagnosis not present

## 2020-08-08 DIAGNOSIS — R262 Difficulty in walking, not elsewhere classified: Secondary | ICD-10-CM | POA: Diagnosis not present

## 2020-08-08 DIAGNOSIS — M25612 Stiffness of left shoulder, not elsewhere classified: Secondary | ICD-10-CM | POA: Diagnosis not present

## 2020-08-08 DIAGNOSIS — Z9012 Acquired absence of left breast and nipple: Secondary | ICD-10-CM | POA: Diagnosis not present

## 2020-08-08 DIAGNOSIS — M461 Sacroiliitis, not elsewhere classified: Secondary | ICD-10-CM | POA: Diagnosis not present

## 2020-08-09 ENCOUNTER — Telehealth: Payer: Self-pay | Admitting: Oncology

## 2020-08-09 ENCOUNTER — Encounter: Payer: Self-pay | Admitting: Oncology

## 2020-08-09 ENCOUNTER — Other Ambulatory Visit: Payer: Self-pay | Admitting: Hematology and Oncology

## 2020-08-09 ENCOUNTER — Ambulatory Visit (INDEPENDENT_AMBULATORY_CARE_PROVIDER_SITE_OTHER): Payer: BC Managed Care – PPO

## 2020-08-09 ENCOUNTER — Inpatient Hospital Stay: Payer: Medicare PPO | Attending: Oncology | Admitting: Oncology

## 2020-08-09 ENCOUNTER — Other Ambulatory Visit: Payer: Self-pay

## 2020-08-09 ENCOUNTER — Other Ambulatory Visit: Payer: Self-pay | Admitting: Oncology

## 2020-08-09 ENCOUNTER — Inpatient Hospital Stay: Payer: Medicare PPO

## 2020-08-09 VITALS — BP 166/99 | HR 103 | Temp 98.4°F | Resp 18 | Ht 61.0 in | Wt 242.1 lb

## 2020-08-09 DIAGNOSIS — J309 Allergic rhinitis, unspecified: Secondary | ICD-10-CM | POA: Diagnosis not present

## 2020-08-09 DIAGNOSIS — D649 Anemia, unspecified: Secondary | ICD-10-CM | POA: Diagnosis not present

## 2020-08-09 DIAGNOSIS — C773 Secondary and unspecified malignant neoplasm of axilla and upper limb lymph nodes: Secondary | ICD-10-CM

## 2020-08-09 DIAGNOSIS — E538 Deficiency of other specified B group vitamins: Secondary | ICD-10-CM | POA: Diagnosis not present

## 2020-08-09 DIAGNOSIS — D500 Iron deficiency anemia secondary to blood loss (chronic): Secondary | ICD-10-CM | POA: Diagnosis not present

## 2020-08-09 LAB — VITAMIN B12: Vitamin B-12: >1000

## 2020-08-09 LAB — HEPATIC FUNCTION PANEL
ALT: 14 (ref 7–35)
AST: 20 (ref 13–35)
Alkaline Phosphatase: 84 (ref 25–125)
Bilirubin, Total: 0.5

## 2020-08-09 LAB — BASIC METABOLIC PANEL
BUN: 12 (ref 4–21)
CO2: 26 — AB (ref 13–22)
Chloride: 102 (ref 99–108)
Creatinine: 1.4 — AB (ref 0.5–1.1)
Glucose: 121
Potassium: 3.8 (ref 3.4–5.3)
Sodium: 136 — AB (ref 137–147)

## 2020-08-09 LAB — COMPREHENSIVE METABOLIC PANEL
Albumin: 4.5 (ref 3.5–5.0)
Calcium: 9.5 (ref 8.7–10.7)

## 2020-08-09 LAB — IRON,TIBC AND FERRITIN PANEL
%SAT: 29
Ferritin: 47.5
Iron: 100
TIBC: 344

## 2020-08-09 LAB — CBC AND DIFFERENTIAL
HCT: 44 (ref 36–46)
Hemoglobin: 15.3 (ref 12.0–16.0)
Neutrophils Absolute: 5.47
Platelets: 281 (ref 150–399)
WBC: 7.6

## 2020-08-09 LAB — CBC: RBC: 5.33 — AB (ref 3.87–5.11)

## 2020-08-09 NOTE — Telephone Encounter (Signed)
Per 1/26 LOS, patient scheduled for April Appt's.  Gave patient Appt Summary

## 2020-08-10 ENCOUNTER — Telehealth: Payer: Self-pay | Admitting: Family Medicine

## 2020-08-10 ENCOUNTER — Ambulatory Visit (INDEPENDENT_AMBULATORY_CARE_PROVIDER_SITE_OTHER): Payer: 59 | Admitting: Adult Health

## 2020-08-10 ENCOUNTER — Telehealth: Payer: Self-pay

## 2020-08-10 ENCOUNTER — Encounter: Payer: Self-pay | Admitting: Adult Health

## 2020-08-10 DIAGNOSIS — F410 Panic disorder [episodic paroxysmal anxiety] without agoraphobia: Secondary | ICD-10-CM | POA: Diagnosis not present

## 2020-08-10 DIAGNOSIS — F411 Generalized anxiety disorder: Secondary | ICD-10-CM | POA: Diagnosis not present

## 2020-08-10 DIAGNOSIS — F331 Major depressive disorder, recurrent, moderate: Secondary | ICD-10-CM | POA: Diagnosis not present

## 2020-08-10 DIAGNOSIS — F39 Unspecified mood [affective] disorder: Secondary | ICD-10-CM

## 2020-08-10 MED ORDER — VIIBRYD 40 MG PO TABS: 40.0000 mg | ORAL_TABLET | Freq: Every day | ORAL | 1 refills | Status: DC

## 2020-08-10 MED ORDER — TOPIRAMATE 50 MG PO TABS: 50.0000 mg | ORAL_TABLET | Freq: Every day | ORAL | 3 refills | Status: DC

## 2020-08-10 MED ORDER — VENLAFAXINE HCL ER 37.5 MG PO CP24: 37.5000 mg | ORAL_CAPSULE | Freq: Every day | ORAL | 0 refills | Status: DC

## 2020-08-10 MED ORDER — VENLAFAXINE HCL ER 75 MG PO CP24: 75.0000 mg | ORAL_CAPSULE | Freq: Every day | ORAL | 5 refills | Status: DC

## 2020-08-10 NOTE — Telephone Encounter (Signed)
-----   Message from Derwood Kaplan, MD sent at 08/09/2020  6:21 PM EST ----- Regarding: note Pls send copy of 1/26 note to Dr. Marco Collie

## 2020-08-10 NOTE — Telephone Encounter (Signed)
Pt states she got the [place] vaccine ad the booster.

## 2020-08-10 NOTE — Progress Notes (Signed)
Laurie Simmons [id number] 11-16-1965 55 y.o.  Subjective:   Patient ID:  Laurie Simmons is a 55 y.o. (DOB 05-Aug-1965) female.  Chief Complaint: No chief complaint on file.   HPI Laurie Simmons presents to the office today for follow-up of anxiety, panic attacks, depression, and mood disorder.  Describes mood today as "okt". Tearful at times. Pleasant. Mood symptoms - reports depression, anxiety, and irritability. Feels like current medications are working well. Oncologist has asked her to stop Wellbutrin and switch to Effexor. Will also call Oncologist about continuing the [place]. Has finished cancer treatment. Recent mastectomy -  30 radiation treatments. Improved interest and motivation. Taking medications as prescribed.  Energy levels lower. Active, does not have a regular exercise routine with current physical disabilities..  Enjoys some usual interests and activities. Married. Lives with husband and their two children - son 62 and daughter 81. Mostly staying home. Appetite adequate. Weight gain - 239 pounds. Sleeps better some nights than others. Averages 6 to 7 hours. Waking up early morning. Focus and concentration difficulties. Completing tasks. Managing aspects of household. Currently disabled. Denies SI or HI.  Denies AH or VH.   Review of Systems:  Review of Systems  Musculoskeletal: Negative for gait problem.  Neurological: Negative for tremors.  Psychiatric/Behavioral:       Please refer to HPI    Medications: I have reviewed the patient's current medications.  Current Outpatient Medications  Medication Sig Dispense Refill  . venlafaxine XR (EFFEXOR XR) 37.5 MG 24 hr capsule Take 1 capsule (37.5 mg total) by mouth daily with breakfast. 14 capsule 0  . venlafaxine XR (EFFEXOR XR) 75 MG 24 hr capsule Take 1 capsule (75 mg total) by mouth daily with breakfast. Take Effexor 37.18m for first 2 weeks. 30 capsule 5  . albuterol (VENTOLIN HFA) 108 (90 Base) MCG/ACT inhaler Inhale two  puffs every 4-6 hours if needed for cough or wheeze 18 g 1  . Azelastine HCl 0.15 % SOLN Can use one spray in each nostril two times daily if needed. 90 mL 1  . clorazepate (TRANXENE) 3.75 MG tablet Take one tablet up to four times daily as needed for anxiety/panic attacks. 120 tablet 2  . docusate sodium (COLACE) 50 MG capsule Take 50 mg by mouth 2 (two) times daily.    . famotidine (PEPCID) 40 MG tablet TAKE 1 TABLET BY MOUTH TWICE DAILY AS DIRECTED 60 tablet 5  . ferrous sulfate 324 MG TBEC Take 65 mg by mouth. Once daily    . fluticasone (FLONASE) 50 MCG/ACT nasal spray     . folic acid (FOLVITE) 1 MG tablet Take 1 mg by mouth daily.    .Marland Kitchenipratropium (ATROVENT) 0.06 % nasal spray Use 2 sprays in each nostril every 6 hours to dry up nose if needed 15 mL 5  . loratadine (CLARITIN) 10 MG tablet Take 10 mg by mouth daily.    . ondansetron (ZOFRAN) 4 MG tablet Take 4 mg by mouth every 4 (four) hours as needed.    . ondansetron (ZOFRAN-ODT) 4 MG disintegrating tablet Take 4 mg by mouth every 6 (six) hours as needed.    . potassium chloride (KLOR-CON) 10 MEQ tablet Take 10 mEq by mouth daily.    . prochlorperazine (COMPAZINE) 10 MG tablet TAKE 1 TABLET BY MOUTH EVERY 6 HOURS AS NEEDED FOR NAUSEA 30 tablet 1  . rosuvastatin (CRESTOR) 5 MG tablet TK 1 T PO 2 TIMES WEEKLY  1  . Semaglutide,0.25 or  0.5MG/DOS, (OZEMPIC, 0.25 OR 0.5 MG/DOSE,) 2 MG/1.5ML SOPN Inject 0.5 mg into the skin. Once weekly    . SYMBICORT 160-4.5 MCG/ACT inhaler INHALE 2 PUFFS BY MOUTH TWICE DAILY TO PREVENT COUGH OR WHEEZE. RINSE, GARGLE AND SPIT AFTER USE 10.2 g 2  . tamoxifen (NOLVADEX) 20 MG tablet Take 1 tablet (20 mg total) by mouth daily. 30 tablet 5  . topiramate (TOPAMAX) 50 MG tablet Take 1 tablet (50 mg total) by mouth daily. 90 tablet 3  . UNABLE TO FIND Allergy injections for cats, grass, and dust.    . valsartan-hydrochlorothiazide (DIOVAN-HCT) 320-25 MG tablet Take 1 tablet by mouth daily.  0  . VIIBRYD 40 MG TABS  Take 1 tablet (40 mg total) by mouth daily. 90 tablet 1   No current facility-administered medications for this visit.    Medication Side Effects: None  Allergies:  Allergies  Allergen Reactions  . Nsaids Other (See Comments)    Pt reports kidney disease    Past Medical History:  Diagnosis Date  . Anxiety   . Arthritis   . Asthma   . Breast cancer ([place]) 2021  . Carpal tunnel syndrome   . Carpal tunnel syndrome    bilateral  . Chronic kidney disease    stage 3  . Depression   . Diabetes ([place])   . GERD (gastroesophageal reflux disease)   . Hypertension   . Irritable bowel syndrome   . Severe anemia [id number]    Family History  Problem Relation Age of Onset  . Pulmonary fibrosis Father   . Cancer Maternal Grandmother        carcinoma of the vulva and possible ovarian cancer  . Prostate cancer Maternal Grandfather   . Lung cancer Paternal Grandfather        heavy smoker    Social History   Socioeconomic History  . Marital status: Married    Spouse name: Abbe Amsterdam  . Number of children: 2  . Years of education: Not on file  . Highest education level: Not on file  Occupational History  . Not on file  Tobacco Use  . Smoking status: Never Smoker  . Smokeless tobacco: Never Used  Vaping Use  . Vaping Use: Never used  Substance and Sexual Activity  . Alcohol use: No  . Drug use: No  . Sexual activity: Yes  Other Topics Concern  . Not on file  Social History Narrative  . Not on file   Social Determinants of Health   Financial Resource Strain: Not on file  Food Insecurity: Not on file  Transportation Needs: Not on file  Physical Activity: Not on file  Stress: Not on file  Social Connections: Not on file  Intimate Partner Violence: Not on file    Past Medical History, Surgical history, Social history, and Family history were reviewed and updated as appropriate.   Please see review of systems for further details on the patient's review from today.    Objective:   Physical Exam:  There were no vitals taken for this visit.  Physical Exam Constitutional:      General: She is not in acute distress. Musculoskeletal:        General: No deformity.  Neurological:     Mental Status: She is alert and oriented to person, place, and time.     Coordination: Coordination normal.  Psychiatric:        Attention and Perception: Attention and perception normal. She does not perceive auditory or visual hallucinations.  Mood and Affect: Mood normal. Mood is not anxious or depressed. Affect is not labile, blunt, angry or inappropriate.        Speech: Speech normal.        Behavior: Behavior normal.        Thought Content: Thought content normal. Thought content is not paranoid or delusional. Thought content does not include homicidal or suicidal ideation. Thought content does not include homicidal or suicidal plan.        Cognition and Memory: Cognition and memory normal.        Judgment: Judgment normal.     Comments: Insight intact     Lab Review:     Component Value Date/Time   NA 136 (A) 08/09/2020 0000   K 3.8 08/09/2020 0000   CL 102 08/09/2020 0000   CO2 26 (A) 08/09/2020 0000   GLUCOSE 97 10/21/2008 0545   BUN 12 08/09/2020 0000   CREATININE 1.4 (A) 08/09/2020 0000   CREATININE 0.76 10/20/2008 2105   CALCIUM 9.5 08/09/2020 0000   PROT 6.0 10/20/2008 2105   ALBUMIN 4.5 08/09/2020 0000   AST 20 08/09/2020 0000   ALT 14 08/09/2020 0000   ALKPHOS 84 08/09/2020 0000   BILITOT 0.2 (L) 10/20/2008 2105   GFRNONAA >60 10/20/2008 2105   GFRAA  10/20/2008 2105    >60        The eGFR has been calculated using the MDRD equation. This calculation has not been validated in all clinical situations. eGFR's persistently <60 mL/min signify possible Chronic Kidney Disease.       Component Value Date/Time   WBC 7.6 08/09/2020 0000   WBC 12.3 (H) 10/22/2008 0530   RBC 5.33 (A) 08/09/2020 0000   HGB 15.3 08/09/2020 0000   HGB  11.6 12/23/2018 1146   HCT 44 08/09/2020 0000   HCT 36.4 12/23/2018 1146   PLT 281 08/09/2020 0000   MCV 70 (L) 12/23/2018 1146   MCH 22.3 (L) 12/23/2018 1146   MCHC 31.9 12/23/2018 1146   MCHC 33.7 10/22/2008 0530   RDW 16.9 (H) 12/23/2018 1146   LYMPHSABS 2.8 12/23/2018 1146   EOSABS 0.2 12/23/2018 1146   BASOSABS 0.1 12/23/2018 1146    No results found for: POCLITH, LITHIUM   No results found for: PHENYTOIN, PHENOBARB, VALPROATE, CBMZ   .res Assessment: Plan:    Plan:  1. Viibyrd 65m daily 2. Tranxene 3.772mup to 4 times daily 3. Topamax 50 mg daily 4. Decrease Wellbutrin [id number] decrease by 150m76mery week until discontinued.  5. Add Effexor 37.5mg 5mly for 1 week, then increase to 75mg 63my.  RTC 4 weeks  Patient advised to contact office with any questions, adverse effects, or acute worsening in signs and symptoms.  Discussed potential benefits, risk, and side effects of benzodiazepines to include potential risk of tolerance and dependence, as well as possible drowsiness. Advised patient not to drive if experiencing drowsiness and to take lowest possible effective dose to minimize risk of dependence and tolerance.    Diagnoses and all orders for this visit:  Major depressive disorder, recurrent episode, moderate (HCC) -     venlafaxine XR (EFFEXOR XR) 37.5 MG 24 hr capsule; Take 1 capsule (37.5 mg total) by mouth daily with breakfast. -     venlafaxine XR (EFFEXOR XR) 75 MG 24 hr capsule; Take 1 capsule (75 mg total) by mouth daily with breakfast. Take Effexor 37.5mg fo56mirst 2 weeks. -     VIIBRYD 40 MG TABS; Take  1 tablet (40 mg total) by mouth daily.  Generalized anxiety disorder -     VIIBRYD 40 MG TABS; Take 1 tablet (40 mg total) by mouth daily.  Episodic mood disorder (HCC) -     topiramate (TOPAMAX) 50 MG tablet; Take 1 tablet (50 mg total) by mouth daily.  Panic attacks     Please see After Visit Summary for patient specific  instructions.  Future Appointments  Date Time Provider [place]  08/18/2020  9:15 AM Dillingham, Loel Lofty, DO [facility] None  11/07/2020  8:30 AM [facility] None  11/07/2020  9:00 AM [place] Scrape A, NP [facility] None  12/25/2020 10:00 AM Kozlow, Donnamarie Poag, MD [facility] None    No orders of the defined types were placed in this encounter.   -------------------------------

## 2020-08-11 ENCOUNTER — Encounter: Payer: Self-pay | Admitting: Allergy and Immunology

## 2020-08-11 ENCOUNTER — Other Ambulatory Visit: Payer: Self-pay | Admitting: Adult Health

## 2020-08-11 DIAGNOSIS — F331 Major depressive disorder, recurrent, moderate: Secondary | ICD-10-CM

## 2020-08-11 DIAGNOSIS — Z1231 Encounter for screening mammogram for malignant neoplasm of breast: Secondary | ICD-10-CM | POA: Diagnosis not present

## 2020-08-11 DIAGNOSIS — F411 Generalized anxiety disorder: Secondary | ICD-10-CM

## 2020-08-11 MED ORDER — VIIBRYD 40 MG PO TABS: 40.0000 mg | ORAL_TABLET | Freq: Every day | ORAL | 1 refills | Status: DC

## 2020-08-12 ENCOUNTER — Other Ambulatory Visit: Payer: Self-pay | Admitting: Hematology and Oncology

## 2020-08-12 DIAGNOSIS — R110 Nausea: Secondary | ICD-10-CM

## 2020-08-15 DIAGNOSIS — J301 Allergic rhinitis due to pollen: Secondary | ICD-10-CM | POA: Diagnosis not present

## 2020-08-16 ENCOUNTER — Other Ambulatory Visit: Payer: Self-pay | Admitting: Adult Health

## 2020-08-16 DIAGNOSIS — F331 Major depressive disorder, recurrent, moderate: Secondary | ICD-10-CM

## 2020-08-16 DIAGNOSIS — Z9012 Acquired absence of left breast and nipple: Secondary | ICD-10-CM | POA: Diagnosis not present

## 2020-08-16 DIAGNOSIS — M461 Sacroiliitis, not elsewhere classified: Secondary | ICD-10-CM | POA: Diagnosis not present

## 2020-08-16 DIAGNOSIS — M25512 Pain in left shoulder: Secondary | ICD-10-CM | POA: Diagnosis not present

## 2020-08-16 DIAGNOSIS — M25612 Stiffness of left shoulder, not elsewhere classified: Secondary | ICD-10-CM | POA: Diagnosis not present

## 2020-08-16 DIAGNOSIS — R262 Difficulty in walking, not elsewhere classified: Secondary | ICD-10-CM | POA: Diagnosis not present

## 2020-08-16 DIAGNOSIS — M2560 Stiffness of unspecified joint, not elsewhere classified: Secondary | ICD-10-CM | POA: Diagnosis not present

## 2020-08-16 DIAGNOSIS — M545 Low back pain, unspecified: Secondary | ICD-10-CM | POA: Diagnosis not present

## 2020-08-16 NOTE — Progress Notes (Signed)
VIALS EXP 08-16-21

## 2020-08-17 ENCOUNTER — Telehealth: Payer: Self-pay

## 2020-08-17 DIAGNOSIS — J3089 Other allergic rhinitis: Secondary | ICD-10-CM | POA: Diagnosis not present

## 2020-08-17 NOTE — Telephone Encounter (Addendum)
I called pt, and gave her the below information. She will mention the drug interaction to her psychiatrist. She states she is currently weaning off Wellbutrin, and then is to start the Effexor per psychiatrist. She mentions that she has been on 2 antidepressants in the past for some while. I told her the biotin, nor Viibyd is an interaction with Tamoxifen. Pt verbalized understanding.    ----- Message from Derwood Kaplan, MD sent at 08/16/2020  8:39 AM EST ----- Regarding: RE: Couple of questions I am not the prescribing physician for anything but the tamoxifen.  So this info needs to be forwarded to her psychologist, I don't know if she put her on the venlafaxine, but that is the interaction of concern with the Vibryd.  We can give her the rec for tapering as advised by the pharmacist.  I don't know if any other antidepressant could be used instead, most have interaction with the tamoxifen.  Usually the psychiatrist is the prescribing MD so may need to run by them also. ----- Message ----- From: Juanetta Beets, [facility] Sent: 08/14/2020   3:43 PM EST To: Derwood Kaplan, MD, Paiton [place] Cherylann Banas, RN Subject: RE: Couple of questions                        Viibryd is an antidepressant and doesn't interact with tamoxifen but is duplicative with venlafaxine.  There is an interaction that warns of possible serotonin syndrome with the use of both.  40 mg is the max dose of viibryd.  My thought is that it must not be working since she is still having symptoms despite the max dose.  Dose of viibryd should be tapered gradually if you decide to recommend discontinuing that one.  This is the recommendation for tapering:  Discontinuation from 40 mg/day, taper dose to 20 mg orally once daily for 4 days, then 10 mg once daily for 3 days.   ----- Message ----- From: Dairl Ponder, RN Sent: 08/14/2020  12:22 PM EST To: Juanetta Beets, RPH Subject: FW: Couple of questions                        Is this ok,  ma'am??  ----- Message ----- From: Derwood Kaplan, MD Sent: 08/14/2020   9:18 AM EST To: Juanetta Beets, RPH, Elisea Khader Cherylann Banas, RN Subject: RE: Couple of questions                        I believe those are okay but we'll ask Manuela Schwartz whether the Vibryd is a problem with tamoxifen? ----- Message ----- From: Dairl Ponder, RN Sent: 08/14/2020   9:04 AM EST To: Derwood Kaplan, MD Subject: Couple of questions                            Pt wanted to ask a couple for questions. First, her psychologist wanted to make sure it was ok for pt to continue the [place]? Second, pt wants to start taking biotin supplement for hair growth. Bottle mentions something about possible kidney problems. Is it ok in your opinion for her to take the biotin? [phone]

## 2020-08-18 ENCOUNTER — Ambulatory Visit (INDEPENDENT_AMBULATORY_CARE_PROVIDER_SITE_OTHER): Payer: Medicare PPO | Admitting: Plastic Surgery

## 2020-08-18 ENCOUNTER — Encounter: Payer: Self-pay | Admitting: Plastic Surgery

## 2020-08-18 ENCOUNTER — Other Ambulatory Visit: Payer: Self-pay

## 2020-08-18 VITALS — BP 128/93 | HR 109 | Ht 65.0 in | Wt 239.8 lb

## 2020-08-18 DIAGNOSIS — D219 Benign neoplasm of connective and other soft tissue, unspecified: Secondary | ICD-10-CM | POA: Diagnosis not present

## 2020-08-18 DIAGNOSIS — Z9012 Acquired absence of left breast and nipple: Secondary | ICD-10-CM

## 2020-08-18 DIAGNOSIS — D500 Iron deficiency anemia secondary to blood loss (chronic): Secondary | ICD-10-CM | POA: Diagnosis not present

## 2020-08-18 DIAGNOSIS — C773 Secondary and unspecified malignant neoplasm of axilla and upper limb lymph nodes: Secondary | ICD-10-CM | POA: Diagnosis not present

## 2020-08-18 HISTORY — DX: Acquired absence of left breast and nipple: Z90.12

## 2020-08-18 NOTE — Progress Notes (Signed)
Patient ID: Laurie Simmons, female    DOB: 1965/09/16, 55 y.o.   MRN: [id number]   Chief Complaint  Patient presents with  . consultation    The patient is a 55 year old female here with her husband for consultation for breast reconstruction.  She underwent left mastectomy by Dr. Noberto Retort.  This was in February 2021.  She does not have a family history of breast cancer.  She had chemotherapy and postop radiation which ended in November.  She has diabetes and kidney disease.  She is not a smoker.  Her last mammogram was January.  There was some changes and she has a follow-up mammogram scheduled for next week.  She is 5 feet 5 inches tall and weighs 239 pounds.  Her sternal notch to right nipple is 40 cm.  She is primarily interested in using her abdomen for reconstruction.  She has a little bit of excess tissue so it is not too tight.  That should be helpful for the left breast reconstruction.  She also has a history of fibroids and at some point will need a fibroidectomy or even a hysterectomy.  She understands that that may change her reconstruction options.  She would like to try to do the reconstruction before the fibroid surgery.   Review of Systems  Constitutional: Positive for activity change. Negative for appetite change.  Eyes: Negative.   Respiratory: Negative.  Negative for chest tightness.   Cardiovascular: Negative for leg swelling.  Gastrointestinal: Negative for abdominal distention and anal bleeding.  Endocrine: Negative.   Genitourinary: Negative.   Musculoskeletal: Positive for back pain and neck pain.  Skin: Negative for color change and wound.  Neurological: Negative.   Hematological: Negative.   Psychiatric/Behavioral: Negative.     Past Medical History:  Diagnosis Date  . Anxiety   . Arthritis   . Asthma   . Breast cancer ([place]) 2021  . Carpal tunnel syndrome   . Carpal tunnel syndrome    bilateral  . Chronic kidney disease    stage 3  . Depression   .  Diabetes ([place])   . GERD (gastroesophageal reflux disease)   . Hypertension   . Irritable bowel syndrome   . Severe anemia [id number]    Past Surgical History:  Procedure Laterality Date  . caesarean    . CHOLECYSTECTOMY    . DILATION AND CURETTAGE OF UTERUS    . HIP SURGERY Left   . PORT A CATH INJECTION ([facility])  2021      Current Outpatient Medications:  .  albuterol (VENTOLIN HFA) 108 (90 Base) MCG/ACT inhaler, Inhale two puffs every 4-6 hours if needed for cough or wheeze, Disp: 18 g, Rfl: 1 .  Azelastine HCl 0.15 % SOLN, Can use one spray in each nostril two times daily if needed., Disp: 90 mL, Rfl: 1 .  clorazepate (TRANXENE) 3.75 MG tablet, Take one tablet up to four times daily as needed for anxiety/panic attacks., Disp: 120 tablet, Rfl: 2 .  docusate sodium (COLACE) 50 MG capsule, Take 50 mg by mouth 2 (two) times daily., Disp: , Rfl:  .  famotidine (PEPCID) 40 MG tablet, TAKE 1 TABLET BY MOUTH TWICE DAILY AS DIRECTED, Disp: 60 tablet, Rfl: 5 .  ferrous sulfate 324 MG TBEC, Take 65 mg by mouth. Once daily, Disp: , Rfl:  .  fluticasone (FLONASE) 50 MCG/ACT nasal spray, , Disp: , Rfl:  .  folic acid (FOLVITE) 1 MG tablet, Take 1 mg by  mouth daily., Disp: , Rfl:  .  ipratropium (ATROVENT) 0.06 % nasal spray, Use 2 sprays in each nostril every 6 hours to dry up nose if needed, Disp: 15 mL, Rfl: 5 .  loratadine (CLARITIN) 10 MG tablet, Take 10 mg by mouth daily., Disp: , Rfl:  .  ondansetron (ZOFRAN) 4 MG tablet, Take 4 mg by mouth every 4 (four) hours as needed., Disp: , Rfl:  .  potassium chloride (KLOR-CON) 10 MEQ tablet, Take 10 mEq by mouth daily., Disp: , Rfl:  .  prochlorperazine (COMPAZINE) 10 MG tablet, TAKE 1 TABLET BY MOUTH EVERY 6 HOURS AS NEEDED FOR NAUSEA, Disp: 30 tablet, Rfl: 1 .  rosuvastatin (CRESTOR) 5 MG tablet, TK 1 T PO 2 TIMES WEEKLY, Disp: , Rfl: 1 .  Semaglutide,0.25 or 0.5MG /DOS, (OZEMPIC, 0.25 OR 0.5 MG/DOSE,) 2 MG/1.5ML SOPN, Inject 0.5 mg into the skin.  Once weekly, Disp: , Rfl:  .  SYMBICORT 160-4.5 MCG/ACT inhaler, INHALE 2 PUFFS BY MOUTH TWICE DAILY TO PREVENT COUGH OR WHEEZE. RINSE, GARGLE AND SPIT AFTER USE, Disp: 10.2 g, Rfl: 2 .  tamoxifen (NOLVADEX) 20 MG tablet, Take 1 tablet (20 mg total) by mouth daily., Disp: 30 tablet, Rfl: 5 .  topiramate (TOPAMAX) 50 MG tablet, Take 1 tablet (50 mg total) by mouth daily., Disp: 90 tablet, Rfl: 3 .  UNABLE TO FIND, Allergy injections for cats, grass, and dust., Disp: , Rfl:  .  valsartan-hydrochlorothiazide (DIOVAN-HCT) 320-25 MG tablet, Take 1 tablet by mouth daily., Disp: , Rfl: 0 .  venlafaxine XR (EFFEXOR XR) 37.5 MG 24 hr capsule, Take 1 capsule (37.5 mg total) by mouth daily with breakfast., Disp: 14 capsule, Rfl: 0 .  venlafaxine XR (EFFEXOR XR) 75 MG 24 hr capsule, Take 1 capsule (75 mg total) by mouth daily with breakfast. Take Effexor 37.5mg  for first 2 weeks., Disp: 30 capsule, Rfl: 5 .  VIIBRYD 40 MG TABS, Take 1 tablet (40 mg total) by mouth daily., Disp: 90 tablet, Rfl: 1 .  ondansetron (ZOFRAN-ODT) 4 MG disintegrating tablet, Take 4 mg by mouth every 6 (six) hours as needed., Disp: , Rfl:    Objective:   Vitals:   08/18/20 0916  BP: (!) 128/93  Pulse: (!) 109  SpO2: 98%    Physical Exam Vitals and nursing note reviewed.  Constitutional:      Appearance: Normal appearance.  HENT:     Head: Normocephalic and atraumatic.  Cardiovascular:     Rate and Rhythm: Normal rate.     Pulses: Normal pulses.  Pulmonary:     Effort: Pulmonary effort is normal. No respiratory distress.  Chest:    Abdominal:     General: Abdomen is flat. There is no distension.     Tenderness: There is no abdominal tenderness.  Skin:    General: Skin is warm.     Coloration: Skin is not jaundiced.     Findings: No bruising or lesion.  Neurological:     General: No focal deficit present.     Mental Status: She is alert and oriented to person, place, and time.  Psychiatric:        Mood and  Affect: Mood normal.        Behavior: Behavior normal.        Thought Content: Thought content normal.     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 Several reconstruction options were explained to the patient.  It is important to remember that breast reconstruction is an optional procedure. Reconstruction often requires several stages of surgery and this means more than one operation.  The surgeries are often done several months apart.  The entire process from start to finish can take a year or more. The major goal of breast reconstruction is to look normal in clothing. There will always be scars and a difference noticeable without clothes.  This is true for asymmetries where both breasts will not be identical.  Surgery may be needed or desired to the non-cancerous breast in order to achieve better symmetry and satisfactory results.  Regardless of the reconstructive method, there is always risks and the possibility that the procedure will fail or have complications.  This could required additional surgeries.    We discussed the available methods of breast reconstruction and included:  1. Tissue expander with Acellular dermal matrix followed by implant based reconstruction. This can be done as one surgery or multiple surgeries.  2. Autologous reconstruction can include using a muscle or tissue from another area of the body for the reconstruction.  3. Combined procedures like the latissismus dorsi flaps that often uses the muscle with an expander or implant.  For each of the method discussed the risks, benefits, scars and recovery time were discussed in detail. Specific risks included bleeding, infection, hematoma, seroma, scarring, pain, wound healing complications, flap loss, fat  necrosis, capsular contracture, need for implant removal, donor site complications, bulge, hernia, umbilical necrosis, need for urgent reoperation, and need for dressing changes.   After the options were discussed we focused on the patient's desires and the procedure that was best for her based on all the information.  A total of 45 minutes of face-to-face time was spent in this encounter, of which >60% was spent in counseling.     I will send the patient the breast reconstruction brochure.  She knows that we will need to wait a minimum of 6 months after her radiation which would put her in June to do any reconstruction on the left breast.  We will also wait to see what happens to the right breast after the repeat mammogram.  We will plan to talk in 3 weeks.   Her options are as follows: Decrease BMI and have a consultation with Dr. Veda Canning at [facility] for possible DIEP flap/TRAM for left breast.  Could have a right breast reduction at any time if the mammogram is negative.  She was given a prescription for [employer] to have a prosthetic made while she is going through the next several months.  I think this will help her to feel a little more comfortable in her clothes.  Pictures were obtained of the patient and placed in the chart with the patient's or guardian's permission.   [place], DO

## 2020-08-21 ENCOUNTER — Telehealth: Payer: Self-pay

## 2020-08-21 NOTE — Telephone Encounter (Signed)
Faxed referral to [facility] for mastectomy supplies.

## 2020-08-22 ENCOUNTER — Other Ambulatory Visit: Payer: Self-pay | Admitting: Adult Health

## 2020-08-22 DIAGNOSIS — R262 Difficulty in walking, not elsewhere classified: Secondary | ICD-10-CM | POA: Diagnosis not present

## 2020-08-22 DIAGNOSIS — F331 Major depressive disorder, recurrent, moderate: Secondary | ICD-10-CM

## 2020-08-22 DIAGNOSIS — M2560 Stiffness of unspecified joint, not elsewhere classified: Secondary | ICD-10-CM | POA: Diagnosis not present

## 2020-08-22 DIAGNOSIS — M461 Sacroiliitis, not elsewhere classified: Secondary | ICD-10-CM | POA: Diagnosis not present

## 2020-08-22 DIAGNOSIS — M545 Low back pain, unspecified: Secondary | ICD-10-CM | POA: Diagnosis not present

## 2020-08-23 ENCOUNTER — Ambulatory Visit (INDEPENDENT_AMBULATORY_CARE_PROVIDER_SITE_OTHER): Payer: BC Managed Care – PPO | Admitting: *Deleted

## 2020-08-23 DIAGNOSIS — M461 Sacroiliitis, not elsewhere classified: Secondary | ICD-10-CM | POA: Diagnosis not present

## 2020-08-23 DIAGNOSIS — J309 Allergic rhinitis, unspecified: Secondary | ICD-10-CM

## 2020-08-23 DIAGNOSIS — M2560 Stiffness of unspecified joint, not elsewhere classified: Secondary | ICD-10-CM | POA: Diagnosis not present

## 2020-08-23 DIAGNOSIS — R262 Difficulty in walking, not elsewhere classified: Secondary | ICD-10-CM | POA: Diagnosis not present

## 2020-08-23 DIAGNOSIS — M545 Low back pain, unspecified: Secondary | ICD-10-CM | POA: Diagnosis not present

## 2020-08-24 DIAGNOSIS — R928 Other abnormal and inconclusive findings on diagnostic imaging of breast: Secondary | ICD-10-CM | POA: Diagnosis not present

## 2020-08-29 DIAGNOSIS — M545 Low back pain, unspecified: Secondary | ICD-10-CM | POA: Diagnosis not present

## 2020-08-29 DIAGNOSIS — R262 Difficulty in walking, not elsewhere classified: Secondary | ICD-10-CM | POA: Diagnosis not present

## 2020-08-29 DIAGNOSIS — M2560 Stiffness of unspecified joint, not elsewhere classified: Secondary | ICD-10-CM | POA: Diagnosis not present

## 2020-08-29 DIAGNOSIS — L299 Pruritus, unspecified: Secondary | ICD-10-CM | POA: Diagnosis not present

## 2020-08-29 DIAGNOSIS — M461 Sacroiliitis, not elsewhere classified: Secondary | ICD-10-CM | POA: Diagnosis not present

## 2020-08-29 DIAGNOSIS — L501 Idiopathic urticaria: Secondary | ICD-10-CM | POA: Diagnosis not present

## 2020-08-30 ENCOUNTER — Other Ambulatory Visit: Payer: Self-pay | Admitting: Oncology

## 2020-08-30 DIAGNOSIS — Z9012 Acquired absence of left breast and nipple: Secondary | ICD-10-CM | POA: Diagnosis not present

## 2020-08-31 ENCOUNTER — Other Ambulatory Visit: Payer: Self-pay | Admitting: Hematology and Oncology

## 2020-08-31 DIAGNOSIS — M461 Sacroiliitis, not elsewhere classified: Secondary | ICD-10-CM | POA: Diagnosis not present

## 2020-08-31 DIAGNOSIS — M545 Low back pain, unspecified: Secondary | ICD-10-CM | POA: Diagnosis not present

## 2020-08-31 DIAGNOSIS — E876 Hypokalemia: Secondary | ICD-10-CM

## 2020-08-31 DIAGNOSIS — M2560 Stiffness of unspecified joint, not elsewhere classified: Secondary | ICD-10-CM | POA: Diagnosis not present

## 2020-08-31 DIAGNOSIS — R262 Difficulty in walking, not elsewhere classified: Secondary | ICD-10-CM | POA: Diagnosis not present

## 2020-09-04 ENCOUNTER — Telehealth: Payer: Self-pay

## 2020-09-04 ENCOUNTER — Other Ambulatory Visit: Payer: Self-pay | Admitting: Oncology

## 2020-09-04 DIAGNOSIS — C773 Secondary and unspecified malignant neoplasm of axilla and upper limb lymph nodes: Secondary | ICD-10-CM

## 2020-09-04 NOTE — Telephone Encounter (Addendum)
Pt notified of below. I told her once procedures get authorization, she will get a call from schedulers. Pt verbalized understanding.   ----- Message from Derwood Kaplan, MD sent at 09/04/2020  1:30 PM EST ----- Regarding: RE: Hip pain Contact: [phone] Yes, I agree, this has persisted too long.  She had full CT scans 6 months ago.  Let's get a bone scan and plain xrays and then go from there.  I will order, please schedule ----- Message ----- From: Dairl Ponder, RN Sent: 09/04/2020  12:44 PM EST To: Derwood Kaplan, MD Subject: Hip pain                                       Pt called to report that the hip pain is not getting better at all. She saw Dr Nyra Capes regarding her back pain after mastectomy. Dr Nyra Capes ordered antiinflammatory meds (which pt states she cant take because of her kidneys) and physical therapy.  She has been taking physical therapy since November. Keirstyn states, "the physical therapy has helped my back a lot, but the hip pain is really making me a nervous. I guess I'm a little paranoid, but I'm wondering if I need a scan".  The pain in her hip is intermittent and worse when standing. I told her that she most likely needs to reach back out to Dr Nyra Capes, but I would mention it to you.  Please advise. [phone]

## 2020-09-05 ENCOUNTER — Ambulatory Visit (INDEPENDENT_AMBULATORY_CARE_PROVIDER_SITE_OTHER): Payer: Medicare PPO

## 2020-09-05 ENCOUNTER — Telehealth: Payer: Self-pay | Admitting: Oncology

## 2020-09-05 DIAGNOSIS — R262 Difficulty in walking, not elsewhere classified: Secondary | ICD-10-CM | POA: Diagnosis not present

## 2020-09-05 DIAGNOSIS — M2560 Stiffness of unspecified joint, not elsewhere classified: Secondary | ICD-10-CM | POA: Diagnosis not present

## 2020-09-05 DIAGNOSIS — M461 Sacroiliitis, not elsewhere classified: Secondary | ICD-10-CM | POA: Diagnosis not present

## 2020-09-05 DIAGNOSIS — J309 Allergic rhinitis, unspecified: Secondary | ICD-10-CM

## 2020-09-05 DIAGNOSIS — M545 Low back pain, unspecified: Secondary | ICD-10-CM | POA: Diagnosis not present

## 2020-09-05 NOTE — Telephone Encounter (Signed)
09/05/20 spoke with patient and sched bone scan/xrays

## 2020-09-06 ENCOUNTER — Encounter: Payer: Self-pay | Admitting: Oncology

## 2020-09-06 DIAGNOSIS — M160 Bilateral primary osteoarthritis of hip: Secondary | ICD-10-CM | POA: Diagnosis not present

## 2020-09-06 DIAGNOSIS — M5451 Vertebrogenic low back pain: Secondary | ICD-10-CM | POA: Diagnosis not present

## 2020-09-06 DIAGNOSIS — M545 Low back pain, unspecified: Secondary | ICD-10-CM | POA: Diagnosis not present

## 2020-09-06 DIAGNOSIS — Z853 Personal history of malignant neoplasm of breast: Secondary | ICD-10-CM | POA: Diagnosis not present

## 2020-09-06 DIAGNOSIS — C50912 Malignant neoplasm of unspecified site of left female breast: Secondary | ICD-10-CM | POA: Diagnosis not present

## 2020-09-06 DIAGNOSIS — M25559 Pain in unspecified hip: Secondary | ICD-10-CM | POA: Diagnosis not present

## 2020-09-06 DIAGNOSIS — C50919 Malignant neoplasm of unspecified site of unspecified female breast: Secondary | ICD-10-CM | POA: Diagnosis not present

## 2020-09-07 ENCOUNTER — Encounter: Payer: Self-pay | Admitting: Adult Health

## 2020-09-07 ENCOUNTER — Other Ambulatory Visit: Payer: Self-pay

## 2020-09-07 ENCOUNTER — Ambulatory Visit (INDEPENDENT_AMBULATORY_CARE_PROVIDER_SITE_OTHER): Payer: 59 | Admitting: Adult Health

## 2020-09-07 DIAGNOSIS — F39 Unspecified mood [affective] disorder: Secondary | ICD-10-CM

## 2020-09-07 DIAGNOSIS — F331 Major depressive disorder, recurrent, moderate: Secondary | ICD-10-CM | POA: Diagnosis not present

## 2020-09-07 DIAGNOSIS — F411 Generalized anxiety disorder: Secondary | ICD-10-CM | POA: Diagnosis not present

## 2020-09-07 DIAGNOSIS — F410 Panic disorder [episodic paroxysmal anxiety] without agoraphobia: Secondary | ICD-10-CM | POA: Diagnosis not present

## 2020-09-07 MED ORDER — VENLAFAXINE HCL ER 37.5 MG PO CP24: 37.5000 mg | ORAL_CAPSULE | Freq: Every day | ORAL | 0 refills | Status: DC

## 2020-09-07 MED ORDER — VENLAFAXINE HCL ER 150 MG PO CP24: 150.0000 mg | ORAL_CAPSULE | Freq: Every day | ORAL | 5 refills | Status: DC

## 2020-09-07 NOTE — Progress Notes (Signed)
Laurie Simmons [id number] 11-09-1965 55 y.o.  Subjective:   Patient ID:  Laurie Simmons is a 55 y.o. (DOB 1966-02-20) female.  Chief Complaint: No chief complaint on file.   HPI Laurie Simmons presents to the office today for follow-up of anxiety, panic attacks, depression, and mood disorder.  Describes mood today as "ok". Tearful at times. Pleasant. Mood symptoms - reports depression, anxiety, and irritability. Feels like she is having "hormonal fluctuations". Tired of this "cancer situation". Tired of the "scares". Tired of "going and going". Stating "I have some time of appointment every single day". Has started Effexor and tapered off Wellbutrin. Feels like the Effexor is not working well enough. Varying interest and motivation. Taking medications as prescribed. Energy levels "getting better". Active, does not have a regular exercise routine with current physical disabilities..  Enjoys some usual interests and activities. Married. Lives with husband and their two children - son 54 and daughter 69. Mostly staying home. Appetite adequate - some "stress" eating. Weight gain - 240 pounds. Sleeps better some nights than others. Averages 6 to 7 hours of "fitful" sleep. Napping during the day occasionally.  Focus and concentration difficulties. Completing tasks. Managing aspects of household. Currently disabled with cancer treatment. Denies SI or HI.  Denies AH or VH.   Review of Systems:  Review of Systems  Musculoskeletal: Negative for gait problem.  Neurological: Negative for tremors.  Psychiatric/Behavioral:       Please refer to HPI    Medications: I have reviewed the patient's current medications.  Current Outpatient Medications  Medication Sig Dispense Refill  . albuterol (VENTOLIN HFA) 108 (90 Base) MCG/ACT inhaler Inhale two puffs every 4-6 hours if needed for cough or wheeze 18 g 1  . Azelastine HCl 0.15 % SOLN Can use one spray in each nostril two times daily if needed. 90 mL 1  .  clorazepate (TRANXENE) 3.75 MG tablet Take one tablet up to four times daily as needed for anxiety/panic attacks. 120 tablet 2  . docusate sodium (COLACE) 50 MG capsule Take 50 mg by mouth 2 (two) times daily.    . famotidine (PEPCID) 40 MG tablet TAKE 1 TABLET BY MOUTH TWICE DAILY AS DIRECTED 60 tablet 5  . ferrous sulfate 324 MG TBEC Take 65 mg by mouth. Once daily    . fluticasone (FLONASE) 50 MCG/ACT nasal spray     . folic acid (FOLVITE) 1 MG tablet TAKE 1 TABLET BY MOUTH DAILY 30 tablet 5  . ipratropium (ATROVENT) 0.06 % nasal spray Use 2 sprays in each nostril every 6 hours to dry up nose if needed 15 mL 5  . loratadine (CLARITIN) 10 MG tablet Take 10 mg by mouth daily.    . ondansetron (ZOFRAN) 4 MG tablet Take 4 mg by mouth every 4 (four) hours as needed.    . ondansetron (ZOFRAN-ODT) 4 MG disintegrating tablet Take 4 mg by mouth every 6 (six) hours as needed.    . potassium chloride (KLOR-CON) 10 MEQ tablet TAKE 1 TABLET BY MOUTH TWICE DAILY 60 tablet 1  . prochlorperazine (COMPAZINE) 10 MG tablet TAKE 1 TABLET BY MOUTH EVERY 6 HOURS AS NEEDED FOR NAUSEA 30 tablet 1  . rosuvastatin (CRESTOR) 5 MG tablet TK 1 T PO 2 TIMES WEEKLY  1  . Semaglutide,0.25 or 0.5MG/DOS, (OZEMPIC, 0.25 OR 0.5 MG/DOSE,) 2 MG/1.5ML SOPN Inject 0.5 mg into the skin. Once weekly    . SYMBICORT 160-4.5 MCG/ACT inhaler INHALE 2 PUFFS BY MOUTH TWICE DAILY  TO PREVENT COUGH OR WHEEZE. RINSE, GARGLE AND SPIT AFTER USE 10.2 g 2  . tamoxifen (NOLVADEX) 20 MG tablet Take 1 tablet (20 mg total) by mouth daily. 30 tablet 5  . topiramate (TOPAMAX) 50 MG tablet Take 1 tablet (50 mg total) by mouth daily. 90 tablet 3  . UNABLE TO FIND Allergy injections for cats, grass, and dust.    . valsartan-hydrochlorothiazide (DIOVAN-HCT) 320-25 MG tablet Take 1 tablet by mouth daily.  0  . venlafaxine XR (EFFEXOR XR) 150 MG 24 hr capsule Take 1 capsule (150 mg total) by mouth daily with breakfast. Take Effexor 37.53m for first 2 weeks. 30  capsule 5  . venlafaxine XR (EFFEXOR XR) 37.5 MG 24 hr capsule Take 1 capsule (37.5 mg total) by mouth daily with breakfast. 14 capsule 0  . VIIBRYD 40 MG TABS Take 1 tablet (40 mg total) by mouth daily. 90 tablet 1   No current facility-administered medications for this visit.    Medication Side Effects: None  Allergies:  Allergies  Allergen Reactions  . Nsaids Other (See Comments)    Pt reports kidney disease Other reaction(s): Other (See Comments) Pt reports kidney disease Pt reports kidney disease    Past Medical History:  Diagnosis Date  . Anxiety   . Arthritis   . Asthma   . Breast cancer [place]  . Carpal tunnel syndrome   . Carpal tunnel syndrome    bilateral  . Chronic kidney disease    stage 3  . Depression   . Diabetes ([place]   . GERD (gastroesophageal reflux disease)   . Hypertension   . Irritable bowel syndrome   . Severe anemia [id number]   Family History  Problem Relation Age of Onset  . Pulmonary fibrosis Father   . Cancer Maternal Grandmother        carcinoma of the vulva and possible ovarian cancer  . Prostate cancer Maternal Grandfather   . Lung cancer Paternal Grandfather        heavy smoker    Social History   Socioeconomic History  . Marital status: Married    Spouse name: PAbbe Amsterdam . Number of children: 2  . Years of education: Not on file  . Highest education level: Not on file  Occupational History  . Not on file  Tobacco Use  . Smoking status: Never Smoker  . Smokeless tobacco: Never Used  Vaping Use  . Vaping Use: Never used  Substance and Sexual Activity  . Alcohol use: No  . Drug use: No  . Sexual activity: Yes  Other Topics Concern  . Not on file  Social History Narrative  . Not on file   Social Determinants of Health   Financial Resource Strain: Not on file  Food Insecurity: Not on file  Transportation Needs: Not on file  Physical Activity: Not on file  Stress: Not on file  Social Connections: Not on file   Intimate Partner Violence: Not on file    Past Medical History, Surgical history, Social history, and Family history were reviewed and updated as appropriate.   Please see review of systems for further details on the patient's review from today.   Objective:   Physical Exam:  There were no vitals taken for this visit.  Physical Exam Constitutional:      General: She is not in acute distress. Musculoskeletal:        General: No deformity.  Neurological:     Mental Status: She is alert  and oriented to person, place, and time.     Coordination: Coordination normal.  Psychiatric:        Attention and Perception: Attention and perception normal. She does not perceive auditory or visual hallucinations.        Mood and Affect: Mood normal. Mood is not anxious or depressed. Affect is not labile, blunt, angry or inappropriate.        Speech: Speech normal.        Behavior: Behavior normal.        Thought Content: Thought content normal. Thought content is not paranoid or delusional. Thought content does not include homicidal or suicidal ideation. Thought content does not include homicidal or suicidal plan.        Cognition and Memory: Cognition and memory normal.        Judgment: Judgment normal.     Comments: Insight intact     Lab Review:     Component Value Date/Time   NA 136 (A) 08/09/2020 0000   K 3.8 08/09/2020 0000   CL 102 08/09/2020 0000   CO2 26 (A) 08/09/2020 0000   GLUCOSE 97 10/21/2008 0545   BUN 12 08/09/2020 0000   CREATININE 1.4 (A) 08/09/2020 0000   CREATININE 0.76 10/20/2008 2105   CALCIUM 9.5 08/09/2020 0000   PROT 6.0 10/20/2008 2105   ALBUMIN 4.5 08/09/2020 0000   AST 20 08/09/2020 0000   ALT 14 08/09/2020 0000   ALKPHOS 84 08/09/2020 0000   BILITOT 0.2 (L) 10/20/2008 2105   GFRNONAA >60 10/20/2008 2105   GFRAA  10/20/2008 2105    >60        The eGFR has been calculated using the MDRD equation. This calculation has not been validated in all  clinical situations. eGFR's persistently <60 mL/min signify possible Chronic Kidney Disease.       Component Value Date/Time   WBC 7.6 08/09/2020 0000   WBC 12.3 (H) 10/22/2008 0530   RBC 5.33 (A) 08/09/2020 0000   HGB 15.3 08/09/2020 0000   HGB 11.6 12/23/2018 1146   HCT 44 08/09/2020 0000   HCT 36.4 12/23/2018 1146   PLT 281 08/09/2020 0000   MCV 70 (L) 12/23/2018 1146   MCH 22.3 (L) 12/23/2018 1146   MCHC 31.9 12/23/2018 1146   MCHC 33.7 10/22/2008 0530   RDW 16.9 (H) 12/23/2018 1146   LYMPHSABS 2.8 12/23/2018 1146   EOSABS 0.2 12/23/2018 1146   BASOSABS 0.1 12/23/2018 1146    No results found for: POCLITH, LITHIUM   No results found for: PHENYTOIN, PHENOBARB, VALPROATE, CBMZ   .res Assessment: Plan:     Plan:  1. Viibyrd 64m daily 2. Tranxene 3.752mup to 4 times daily 3. Topamax 50 mg daily 4. Increase Effexor 75103maily to 112.5mg36mily x 7 days, then increase to 150mg34mly.  [facility].  RTC 4 weeks  Patient advised to contact office with any questions, adverse effects, or acute worsening in signs and symptoms.  Discussed potential benefits, risk, and side effects of benzodiazepines to include potential risk of tolerance and dependence, as well as possible drowsiness. Advised patient not to drive if experiencing drowsiness and to take lowest possible effective dose to minimize risk of dependence and tolerance.   Diagnoses and all orders for this visit:  Generalized anxiety disorder  Major depressive disorder, recurrent episode, moderate (HCC) -     venlafaxine XR (EFFEXOR XR) 37.5 MG 24 hr capsule; Take 1 capsule (37.5 mg  total) by mouth daily with breakfast. -     venlafaxine XR (EFFEXOR XR) 150 MG 24 hr capsule; Take 1 capsule (150 mg total) by mouth daily with breakfast. Take Effexor 37.78m for first 2 weeks.  Episodic mood disorder (HCC)  Panic attacks     Please see After Visit Summary for  patient specific instructions.  Future Appointments  Date Time Provider DClayton 09/11/2020  9:00 AM Dillingham, CLoel Lofty DO [facility] None  11/07/2020  8:30 AM [facility] None  11/07/2020  9:00 AM PDayton ScrapeA, NP [facility] None  12/25/2020 10:00 AM Kozlow, EDonnamarie Poag MD [facility] None    No orders of the defined types were placed in this encounter.   -------------------------------

## 2020-09-08 ENCOUNTER — Telehealth: Payer: Self-pay

## 2020-09-08 NOTE — Telephone Encounter (Signed)
Pt called to ask if Dr Hinton Rao had gotten results from bone scan & xray results.

## 2020-09-11 ENCOUNTER — Other Ambulatory Visit: Payer: Self-pay

## 2020-09-11 ENCOUNTER — Ambulatory Visit (INDEPENDENT_AMBULATORY_CARE_PROVIDER_SITE_OTHER): Payer: BC Managed Care – PPO | Admitting: *Deleted

## 2020-09-11 ENCOUNTER — Telehealth (INDEPENDENT_AMBULATORY_CARE_PROVIDER_SITE_OTHER): Payer: Medicare PPO | Admitting: Plastic Surgery

## 2020-09-11 ENCOUNTER — Encounter: Payer: Self-pay | Admitting: Plastic Surgery

## 2020-09-11 DIAGNOSIS — M461 Sacroiliitis, not elsewhere classified: Secondary | ICD-10-CM | POA: Diagnosis not present

## 2020-09-11 DIAGNOSIS — D219 Benign neoplasm of connective and other soft tissue, unspecified: Secondary | ICD-10-CM

## 2020-09-11 DIAGNOSIS — J309 Allergic rhinitis, unspecified: Secondary | ICD-10-CM | POA: Diagnosis not present

## 2020-09-11 DIAGNOSIS — M545 Low back pain, unspecified: Secondary | ICD-10-CM | POA: Diagnosis not present

## 2020-09-11 DIAGNOSIS — R262 Difficulty in walking, not elsewhere classified: Secondary | ICD-10-CM | POA: Diagnosis not present

## 2020-09-11 DIAGNOSIS — M2560 Stiffness of unspecified joint, not elsewhere classified: Secondary | ICD-10-CM | POA: Diagnosis not present

## 2020-09-11 NOTE — Telephone Encounter (Addendum)
Pt notified of Dr Remi Deter response.    ----- Message from Derwood Kaplan, MD sent at 09/11/2020 10:01 AM EST ----- Regarding: RE: call pt I'm sorry, out of my field, ruled out cancer, rec she ask Dr. Nyra Capes, reports were forwarded. ----- Message ----- From: Dairl Ponder, RN Sent: 09/11/2020   9:52 AM EST To: Derwood Kaplan, MD Subject: RE: call pt                                    Pt wants to know, "where to go from here? My physical therapist and I think I maybe have a pinched nerve. Who does I need to see for that"?    ----- Message ----- From: Derwood Kaplan, MD Sent: 09/08/2020   5:25 PM EST To: Belva Chimes, LPN, Cashel Bellina Cherylann Banas, RN Subject: call pt                                        Tell her no sign of cancer on tests.  She has arthritis of knees, shoulders and right foot.  Xrays of back show degenerative changes of lower back.  Copies were sent to Dr. Nyra Capes

## 2020-09-11 NOTE — Progress Notes (Signed)
The patient is a 55 year old female joining me by the phone.  She had a left mastectomy by Dr. Cheri Guppy and February 2021.  She had chemotherapy and radiation which ended in November.  She has diabetes and kidney disease but is not a smoker.  She just had a mammogram for the other breast and it was negative.  She still wants to be considered for a Diep flap.  We talked about Dr. Evelena Leyden me and going to see him.  She knows that surgery would not be until least 6 months if not 12 months after her radiation therapy.  She also needs to decrease her BMI and make sure her hemoglobin A1c is within normal limits letter to surgery.   The patient gave consent to have this visit done by telemedicine / virtual visit.  This is also consent for access the chart and treat the patient via this visit. The patient is located at home.  I, the provider, am at the office.  We spent 5 minutes together for the visit.

## 2020-09-12 DIAGNOSIS — Z9012 Acquired absence of left breast and nipple: Secondary | ICD-10-CM | POA: Diagnosis not present

## 2020-09-18 ENCOUNTER — Ambulatory Visit (INDEPENDENT_AMBULATORY_CARE_PROVIDER_SITE_OTHER): Payer: Medicare PPO | Admitting: *Deleted

## 2020-09-18 DIAGNOSIS — J309 Allergic rhinitis, unspecified: Secondary | ICD-10-CM | POA: Diagnosis not present

## 2020-09-21 ENCOUNTER — Telehealth: Payer: Self-pay | Admitting: Adult Health

## 2020-09-21 NOTE — Telephone Encounter (Signed)
Pt called and left a message that she is havoing side effects of the effexor. She is having increased heart rate and it is not any better. Please call her with another option at [phone]

## 2020-09-21 NOTE — Telephone Encounter (Signed)
Can we get more detail. Her oncologist wanted her on Effexor. She can taper off if needed.

## 2020-09-22 NOTE — Telephone Encounter (Signed)
Pt reports when she increased her dose her side effects worsened. Advised her to decrease back down to 75 mg effexor then I would call back with how long to stay at that. She reports the oncologist didn't want her on Wellbutrin because of interaction with Tamoxifen and the pharmacist suggested Effexor. It does not have to be Effexor as long as it doesn't interact with her Tamoxifen.    She reports having a week left of her 75 mg, how long do you want her to continue that dose? She will need an updated Rx. She is out of the 37.5 mg now.  Also she is scheduled on 10/05/20, asking if you want her to move apt back since tapering off of Effexor and going on something else or keep same apt?

## 2020-09-22 NOTE — Telephone Encounter (Signed)
Effexor 75mg  daily x 7 days, then 37.5mg  daily x 7 days. Have her reach out to Oncologist for approved medications.

## 2020-09-25 ENCOUNTER — Other Ambulatory Visit: Payer: Self-pay

## 2020-09-25 ENCOUNTER — Other Ambulatory Visit: Payer: Self-pay | Admitting: Allergy and Immunology

## 2020-09-25 DIAGNOSIS — F331 Major depressive disorder, recurrent, moderate: Secondary | ICD-10-CM

## 2020-09-25 MED ORDER — VENLAFAXINE HCL ER 37.5 MG PO CP24: ORAL_CAPSULE | ORAL | 0 refills | Status: DC

## 2020-09-25 NOTE — Telephone Encounter (Signed)
Rx for 37.5 mg Effexor X 7days sent to pharmacy.  Pt will call back once she contacts her oncologist.

## 2020-09-26 ENCOUNTER — Telehealth: Payer: Self-pay | Admitting: Adult Health

## 2020-09-26 ENCOUNTER — Telehealth: Payer: Self-pay

## 2020-09-26 NOTE — Telephone Encounter (Signed)
Patient lm stating she spoke with her Oncologist today. She wanted you to know per that conversation Celexa would not interfere with her antidepressant. Direct questions to # [phone].

## 2020-09-26 NOTE — Telephone Encounter (Addendum)
Pt notified, & verbalized understanding.    ----- Message from Marvia Pickles, PA-C sent at 09/25/2020  5:03 PM EDT ----- Regarding: RE: antidepressant & Tamoxifen It's more like what can you take besides Effexor while on tamoxifen. Our choice would be Celexa (citalopram) as less interaction with tamoxifen. ----- Message ----- From: Dairl Ponder, RN Sent: 09/25/2020   4:43 PM EDT To: Marvia Pickles, PA-C Subject: antidepressant & Tamoxifen                     Pt called to report her psychologist is taking her off Effexor, due to bad side effects. She wants to know if there is an antidepressant that should be avoided with tamoxifen? [phone]

## 2020-09-26 NOTE — Telephone Encounter (Signed)
If the Oncologist recommends Celexa, we can start at 20mg  daily - 1/2 x 7 days, then one tablet daily.

## 2020-09-26 NOTE — Telephone Encounter (Signed)
Please review. She's tapering off of Effexor and I asked her to check with oncology for something she could take.

## 2020-09-27 ENCOUNTER — Ambulatory Visit (INDEPENDENT_AMBULATORY_CARE_PROVIDER_SITE_OTHER): Payer: BC Managed Care – PPO

## 2020-09-27 ENCOUNTER — Other Ambulatory Visit: Payer: Self-pay

## 2020-09-27 DIAGNOSIS — J309 Allergic rhinitis, unspecified: Secondary | ICD-10-CM

## 2020-09-27 MED ORDER — CITALOPRAM HYDROBROMIDE 20 MG PO TABS: ORAL_TABLET | ORAL | 0 refills | Status: DC

## 2020-09-27 NOTE — Telephone Encounter (Signed)
Spoke with pt and discussed the Celexa, she did want to start this. New Rx sent in to her pharmacy. Advised her to call me back Mon/Tues next week and let me know how she is.   She has apt next week on the 24 th, advised her to keep it for now then if doing okay move it out further.

## 2020-09-27 NOTE — Telephone Encounter (Signed)
Noted  

## 2020-10-02 ENCOUNTER — Ambulatory Visit (INDEPENDENT_AMBULATORY_CARE_PROVIDER_SITE_OTHER): Payer: BC Managed Care – PPO | Admitting: *Deleted

## 2020-10-02 DIAGNOSIS — J309 Allergic rhinitis, unspecified: Secondary | ICD-10-CM

## 2020-10-03 ENCOUNTER — Other Ambulatory Visit: Payer: Self-pay

## 2020-10-03 ENCOUNTER — Other Ambulatory Visit: Payer: Self-pay | Admitting: Adult Health

## 2020-10-03 ENCOUNTER — Telehealth: Payer: Self-pay | Admitting: Adult Health

## 2020-10-03 DIAGNOSIS — F331 Major depressive disorder, recurrent, moderate: Secondary | ICD-10-CM

## 2020-10-03 MED ORDER — CITALOPRAM HYDROBROMIDE 20 MG PO TABS: ORAL_TABLET | ORAL | 0 refills | Status: DC

## 2020-10-03 NOTE — Telephone Encounter (Signed)
Pt informed

## 2020-10-03 NOTE — Telephone Encounter (Signed)
4 weeks

## 2020-10-03 NOTE — Telephone Encounter (Signed)
Reviewed

## 2020-10-03 NOTE — Telephone Encounter (Signed)
Noted  

## 2020-10-03 NOTE — Telephone Encounter (Signed)
Pt wants to know how far out should she schedule the appt.Also the Celexa was prescribed 1/2 tab for 7 days then 1 tab daily.Do you want her to continue taking 1 tab daily?

## 2020-10-03 NOTE — Telephone Encounter (Signed)
She can r/s and give medication more time.

## 2020-10-03 NOTE — Telephone Encounter (Signed)
Please review

## 2020-10-03 NOTE — Telephone Encounter (Signed)
Laurie Simmons's next visit is 10/05/20. Laurie Simmons said she seems to be doing well on her Celexa. She was very emotional this past week but thinks it was coming from being off her previous medication. She is asking if she needs to come to her appt on 3/24 or push it out some more? Her phone number is [phone].

## 2020-10-05 ENCOUNTER — Ambulatory Visit: Payer: 59 | Admitting: Adult Health

## 2020-10-06 ENCOUNTER — Telehealth: Payer: Self-pay | Admitting: Adult Health

## 2020-10-06 NOTE — Telephone Encounter (Signed)
Error

## 2020-10-06 NOTE — Telephone Encounter (Signed)
Have her stop the medication or switch to a different time - should not be keeping her awake.

## 2020-10-06 NOTE — Telephone Encounter (Signed)
Rtc to pt and she reports she is not sleeping at all. She is only taking 1/2 tablet of Celexa 20 mg in the am. Nothing else has changed to cause sleep issues. Informed her Barnett Applebaum may have her stop over the weekend then call us back Monday but I will discuss with her and call back.

## 2020-10-06 NOTE — Telephone Encounter (Signed)
Next visit is 11/08/20. Laurie Simmons called and said that she is having trouble sleeping with the Celexa. She asks if it will go away as her body adjusts or is it a side effect to the medication? Her phone number is [phone].

## 2020-10-09 DIAGNOSIS — M7061 Trochanteric bursitis, right hip: Secondary | ICD-10-CM | POA: Diagnosis not present

## 2020-10-09 NOTE — Telephone Encounter (Signed)
Rtc to pt and she did already stop her Celexa on Friday and reports her sleep is improved. She will contact her Oncologist again for another recommendation. So far she's tried Wellbutrin, Zoloft, and now Celexa.   She will call back with the recommendation.

## 2020-10-09 NOTE — Telephone Encounter (Signed)
Noted  

## 2020-10-10 ENCOUNTER — Telehealth: Payer: Self-pay | Admitting: Hematology and Oncology

## 2020-10-10 ENCOUNTER — Telehealth: Payer: Self-pay

## 2020-10-10 ENCOUNTER — Other Ambulatory Visit: Payer: Self-pay | Admitting: Hematology and Oncology

## 2020-10-10 DIAGNOSIS — F331 Major depressive disorder, recurrent, moderate: Secondary | ICD-10-CM

## 2020-10-10 DIAGNOSIS — F411 Generalized anxiety disorder: Secondary | ICD-10-CM

## 2020-10-10 DIAGNOSIS — R9389 Abnormal findings on diagnostic imaging of other specified body structures: Secondary | ICD-10-CM

## 2020-10-10 NOTE — Telephone Encounter (Deleted)
RE: antidepressant; f/u xray in May 2022? Received: Today Phy, Beverly Gust, RPH  Mosher, Trellis Moment; Derwood Kaplan, MD; Dairl Ponder, RN Phone Number: [phone]   There is an interaction with escitalopram and tamoxifen. It says the concurrent use of both may increase the risk of QT-interval prolongation.   One thought is to add the new agent rexulti (brexpiprazole) to her celexa, but that is getting beyond my area of expertise. There are no interactions with current meds to add that one. Isn't she seeing a psychiatrist? Maybe she can ask him if that one would be a good options for her   Nnamdi Dacus: @1341 , I spoke with pt. She stopped the Celexa all together over the weekend. She states, "I was only taking 10mg  Celexa in the morning and I still couldn't sleep. She is taking the Vibryd in am. Is not taking effexor, as it "made her heart race". The only other medication I take is topamax"   RE: antidepressant; f/u xray in May 2022? Received: Today Mosher, Thalia Bloodgood, PA-C  Phy, Beverly Gust, RPH; Derwood Kaplan, MD; Dairl Ponder, RN Phone Number: [phone]   She is only taking the citalopram that I'm aware of. Venlafaxine was d/c'd because of side effects. I did not notice the viibryd. Then we could try escitalopram even though prob similar side effects?   Rafiel Mecca is she still taking viibryd?        Previous Messages   ----- Message -----  From: Juanetta Beets, [facility]  Sent: 10/10/2020  1:24 PM EDT  To: Derwood Kaplan, MD, Dairl Ponder, RN, *  Subject: RE: antidepressant; f/u xray in May 2022?     Antidepressants to AVOID with tamoxifen: bupropion (wellbutrin), paroxetine (paxil), fluoxetine (prozac) and duloxetine (cymbalta) since these are moderate to strong inhibitors of CYP2D6.   Safest choices include venlafaxine (effexor) or citalopram (celexa)as these have a lesser effect on CYP2D6.   Her med list includes celexa, viibryd, and effexor. Is she currently  taking all of these?      Mosher, Thalia Bloodgood, PA-C  Derwood Kaplan, MD; Dairl Ponder, RN; Phy, Beverly Gust, RPH Cc: Melodye Ped, NP Phone Number: [phone]   1. She only just switched to Celexa 2 weeks ago. Is she taking it in the morning so it doesn't cause insomnia? What dose is she on? Again, our choices are limited with the use of tamoxifen. Escitalopram? Manuela Schwartz, do you have any recommendations?  2. I will order the knee/femur/pelvis xrays to be done prior to her appt with Melissa on 4/26.    ----- Message -----  From: Derwood Kaplan, MD  Sent: 10/10/2020 12:56 PM EDT  To: Juanetta Beets, RPH, Jovoni Borkenhagen Cherylann Banas, RN, *  Subject: RE: antidepressant; f/u xray in May 2022?     1. Kelli, can you help with this? I don't think Celexa is that great a choice anyway with tamoxifen.  What are our safest options?   2.Yes, they rec 3 month f/u of sclerotic area of pelvis and bone scan also rec right knee/femur xrays to f/u on soft tissue uptake. Would you schedule those before or day of appt?    ----- Message -----  From: Dairl Ponder, RN  Sent: 10/10/2020  9:23 AM EDT  To: Derwood Kaplan, MD  Subject: antidepressant; f/u xray in May 2022?       1. Pt called to ask for advice regarding her antidepressant. "I am taking  Celexa, but it isn't helping me, its only causing insomnia. What other antidepressant would be ok to take with the tamoxifen"?   2. She saw an orthopedic yesterday, who reviewed her scans done here. The physician mentioned that she has an abnormal area near her pelvic bone. The radiologist recommended F/U xray in May 2022. Is that going to be scheduled?    [phone]

## 2020-10-10 NOTE — Telephone Encounter (Signed)
10/10/20 Spoke with patient and sent xray order to [facility]

## 2020-10-11 ENCOUNTER — Other Ambulatory Visit: Payer: Self-pay | Admitting: Adult Health

## 2020-10-11 ENCOUNTER — Telehealth: Payer: Self-pay | Admitting: Adult Health

## 2020-10-11 DIAGNOSIS — F331 Major depressive disorder, recurrent, moderate: Secondary | ICD-10-CM

## 2020-10-11 MED ORDER — TRAZODONE HCL 50 MG PO TABS: 50.0000 mg | ORAL_TABLET | Freq: Every day | ORAL | 2 refills | Status: DC

## 2020-10-11 NOTE — Telephone Encounter (Signed)
We could try Trazadone 50mg  or Vistaril for sleep.

## 2020-10-11 NOTE — Telephone Encounter (Addendum)
Pt notified of below. She states she sees a psychologist, & will discuss with her.   ----- Message from Marvia Pickles, PA-C sent at 10/11/2020  8:30 AM EDT ----- Regarding: RE: antidepressant, f/u xray in May 2022 Contact: [phone] I don't think we have any further advice on the antidepressant. Is she seeing a psychiatrist? That is probably the best option for her. May need advice of a pharmacist specializing in antidepressant use. I'm sorry.  ----- Message ----- From: Dairl Ponder, RN Sent: 10/10/2020   3:18 PM EDT To: Derwood Kaplan, MD, Juanetta Beets, [facility], # Subject: antidepressant, f/u xray in May 2022           THIS IS THE MOST UP TO DATE MESSAGE. WE CAN CHART ON THIS ONE, DELETE ALL OTHER MESSAGES.     RE: antidepressant; f/u xray in May 2022? Received: Today Phy, Beverly Gust, RPH  Mosher, Trellis Moment; Derwood Kaplan, MD; Dairl Ponder, RN Phone Number: [phone]  There is an interaction with escitalopram and tamoxifen. It says the concurrent use of both may increase the risk of QT-interval prolongation.   One thought is to add the new agent rexulti (brexpiprazole) to her celexa, but that is getting beyond my area of expertise. There are no interactions with current meds to add that one. Isn't she seeing a psychiatrist? Maybe she can ask him if that one would be a good options for her   Evola Hollis: @1341 , I spoke with pt. She stopped the Celexa all together over the weekend. She states, "I was only taking 10mg  Celexa in the morning and I still couldn't sleep. She is taking the Vibryd in am. Is not taking effexor, as it "made her heart race". The only other medication I take is topamax"   RE: antidepressant; f/u xray in May 2022? Received: Today Mosher, Thalia Bloodgood, PA-C  Phy, Beverly Gust, RPH; Derwood Kaplan, MD; Dairl Ponder, RN Phone Number: [phone]  She is only taking the citalopram that I'm aware of. Venlafaxine was d/c'd because of side  effects. I did not notice the viibryd. Then we could try escitalopram even though prob similar side effects?   Leslee Suire is she still taking viibryd?      Previous Messages   ----- Message -----  From: Juanetta Beets, [facility]  Sent: 10/10/2020  1:24 PM EDT  To: Derwood Kaplan, MD, Dairl Ponder, RN, #  Subject: RE: antidepressant; f/u xray in May 2022?     Antidepressants to AVOID with tamoxifen: bupropion (wellbutrin), paroxetine (paxil), fluoxetine (prozac) and duloxetine (cymbalta) since these are moderate to strong inhibitors of CYP2D6.   Safest choices include venlafaxine (effexor) or citalopram (celexa)as these have a lesser effect on CYP2D6.   Her med list includes celexa, viibryd, and effexor. Is she currently taking all of these?      Mosher, Thalia Bloodgood, PA-C  Derwood Kaplan, MD; Dairl Ponder, RN; Phy, Beverly Gust, RPH Cc: Melodye Ped, NP Phone Number: [phone]  1. She only just switched to Celexa 2 weeks ago. Is she taking it in the morning so it doesn't cause insomnia? What dose is she on? Again, our choices are limited with the use of tamoxifen. Escitalopram? Manuela Schwartz, do you have any recommendations?  2. I will order the knee/femur/pelvis xrays to be done prior to her appt with Melissa on 4/26.    ----- Message -----  From: Derwood Kaplan, MD  Sent: 10/10/2020 12:56 PM  EDT  To: Juanetta Beets, RPH, Leven Hoel Cherylann Banas, RN, #  Subject: RE: antidepressant; f/u xray in May 2022?     1. Kelli, can you help with this? I don't think Celexa is that great a choice anyway with tamoxifen.  What are our safest options?   2.Yes, they rec 3 month f/u of sclerotic area of pelvis and bone scan also rec right knee/femur xrays to f/u on soft tissue uptake. Would you schedule those before or day of appt?    ----- Message -----  From: Dairl Ponder, RN  Sent: 10/10/2020  9:23 AM EDT  To: Derwood Kaplan, MD  Subject: antidepressant; f/u xray in May  2022?       1. Pt called to ask for advice regarding her antidepressant. "I am taking Celexa, but it isn't helping me, its only causing insomnia. What other antidepressant would be ok to take with the tamoxifen"?   2. She saw an orthopedic yesterday, who reviewed her scans done here. The physician mentioned that she has an abnormal area near her pelvic bone. The radiologist recommended F/U xray in May 2022. Is that going to be scheduled?    [phone]

## 2020-10-11 NOTE — Telephone Encounter (Signed)
Pt would like Rx sent to [facility], [place] - [address] AT [address]  She stated that she stopped taking Celexa due to it causing insomnia.Will adding the rexulti help with insomnia?

## 2020-10-11 NOTE — Telephone Encounter (Signed)
Please review

## 2020-10-11 NOTE — Telephone Encounter (Signed)
Script sent  

## 2020-10-11 NOTE — Telephone Encounter (Signed)
Pt called and said that she talked to her oncologist about the celexa. She said that the doctor doesn't have any other substitute for the celexa. The doctor suggested adding rexulti with the celexa to help with the insomnia . Laurie Simmons is willing to try something completely different as well. Please give her a call at [phone]

## 2020-10-11 NOTE — Telephone Encounter (Signed)
We can add 0.5mg  of Rexulti.

## 2020-10-11 NOTE — Telephone Encounter (Signed)
Pt would like to try trazadone

## 2020-10-11 NOTE — Telephone Encounter (Signed)
Reviewed

## 2020-10-16 ENCOUNTER — Other Ambulatory Visit: Payer: Self-pay | Admitting: Hematology and Oncology

## 2020-10-16 DIAGNOSIS — R110 Nausea: Secondary | ICD-10-CM

## 2020-10-17 ENCOUNTER — Ambulatory Visit (INDEPENDENT_AMBULATORY_CARE_PROVIDER_SITE_OTHER): Payer: Medicare PPO

## 2020-10-17 ENCOUNTER — Other Ambulatory Visit: Payer: Self-pay | Admitting: Allergy and Immunology

## 2020-10-17 DIAGNOSIS — R531 Weakness: Secondary | ICD-10-CM | POA: Diagnosis not present

## 2020-10-17 DIAGNOSIS — M7071 Other bursitis of hip, right hip: Secondary | ICD-10-CM | POA: Diagnosis not present

## 2020-10-17 DIAGNOSIS — R262 Difficulty in walking, not elsewhere classified: Secondary | ICD-10-CM | POA: Diagnosis not present

## 2020-10-17 DIAGNOSIS — J309 Allergic rhinitis, unspecified: Secondary | ICD-10-CM | POA: Diagnosis not present

## 2020-10-17 DIAGNOSIS — M25551 Pain in right hip: Secondary | ICD-10-CM | POA: Diagnosis not present

## 2020-10-17 DIAGNOSIS — M545 Low back pain, unspecified: Secondary | ICD-10-CM | POA: Diagnosis not present

## 2020-10-24 DIAGNOSIS — M545 Low back pain, unspecified: Secondary | ICD-10-CM | POA: Diagnosis not present

## 2020-10-24 DIAGNOSIS — M25551 Pain in right hip: Secondary | ICD-10-CM | POA: Diagnosis not present

## 2020-10-24 DIAGNOSIS — M7071 Other bursitis of hip, right hip: Secondary | ICD-10-CM | POA: Diagnosis not present

## 2020-10-24 DIAGNOSIS — R531 Weakness: Secondary | ICD-10-CM | POA: Diagnosis not present

## 2020-10-24 DIAGNOSIS — R262 Difficulty in walking, not elsewhere classified: Secondary | ICD-10-CM | POA: Diagnosis not present

## 2020-10-26 ENCOUNTER — Other Ambulatory Visit: Payer: Self-pay | Admitting: Adult Health

## 2020-10-26 ENCOUNTER — Other Ambulatory Visit: Payer: Self-pay | Admitting: Hematology and Oncology

## 2020-10-26 DIAGNOSIS — E876 Hypokalemia: Secondary | ICD-10-CM

## 2020-10-26 DIAGNOSIS — K1321 Leukoplakia of oral mucosa, including tongue: Secondary | ICD-10-CM | POA: Diagnosis not present

## 2020-10-26 DIAGNOSIS — K134 Granuloma and granuloma-like lesions of oral mucosa: Secondary | ICD-10-CM | POA: Diagnosis not present

## 2020-10-27 DIAGNOSIS — E785 Hyperlipidemia, unspecified: Secondary | ICD-10-CM | POA: Diagnosis not present

## 2020-10-27 DIAGNOSIS — R262 Difficulty in walking, not elsewhere classified: Secondary | ICD-10-CM | POA: Diagnosis not present

## 2020-10-27 DIAGNOSIS — M7071 Other bursitis of hip, right hip: Secondary | ICD-10-CM | POA: Diagnosis not present

## 2020-10-27 DIAGNOSIS — R531 Weakness: Secondary | ICD-10-CM | POA: Diagnosis not present

## 2020-10-27 DIAGNOSIS — E1169 Type 2 diabetes mellitus with other specified complication: Secondary | ICD-10-CM | POA: Diagnosis not present

## 2020-10-27 DIAGNOSIS — M545 Low back pain, unspecified: Secondary | ICD-10-CM | POA: Diagnosis not present

## 2020-10-27 DIAGNOSIS — M25551 Pain in right hip: Secondary | ICD-10-CM | POA: Diagnosis not present

## 2020-10-31 ENCOUNTER — Ambulatory Visit (INDEPENDENT_AMBULATORY_CARE_PROVIDER_SITE_OTHER): Payer: Medicare PPO

## 2020-10-31 DIAGNOSIS — R948 Abnormal results of function studies of other organs and systems: Secondary | ICD-10-CM | POA: Diagnosis not present

## 2020-10-31 DIAGNOSIS — M25551 Pain in right hip: Secondary | ICD-10-CM | POA: Diagnosis not present

## 2020-10-31 DIAGNOSIS — Z853 Personal history of malignant neoplasm of breast: Secondary | ICD-10-CM | POA: Diagnosis not present

## 2020-10-31 DIAGNOSIS — M7071 Other bursitis of hip, right hip: Secondary | ICD-10-CM | POA: Diagnosis not present

## 2020-10-31 DIAGNOSIS — J309 Allergic rhinitis, unspecified: Secondary | ICD-10-CM | POA: Diagnosis not present

## 2020-10-31 DIAGNOSIS — C773 Secondary and unspecified malignant neoplasm of axilla and upper limb lymph nodes: Secondary | ICD-10-CM | POA: Diagnosis not present

## 2020-10-31 DIAGNOSIS — C801 Malignant (primary) neoplasm, unspecified: Secondary | ICD-10-CM | POA: Diagnosis not present

## 2020-10-31 DIAGNOSIS — R9389 Abnormal findings on diagnostic imaging of other specified body structures: Secondary | ICD-10-CM | POA: Diagnosis not present

## 2020-10-31 DIAGNOSIS — M545 Low back pain, unspecified: Secondary | ICD-10-CM | POA: Diagnosis not present

## 2020-10-31 DIAGNOSIS — R937 Abnormal findings on diagnostic imaging of other parts of musculoskeletal system: Secondary | ICD-10-CM | POA: Diagnosis not present

## 2020-10-31 DIAGNOSIS — R531 Weakness: Secondary | ICD-10-CM | POA: Diagnosis not present

## 2020-10-31 DIAGNOSIS — R262 Difficulty in walking, not elsewhere classified: Secondary | ICD-10-CM | POA: Diagnosis not present

## 2020-10-31 DIAGNOSIS — M1611 Unilateral primary osteoarthritis, right hip: Secondary | ICD-10-CM | POA: Diagnosis not present

## 2020-11-03 DIAGNOSIS — R262 Difficulty in walking, not elsewhere classified: Secondary | ICD-10-CM | POA: Diagnosis not present

## 2020-11-03 DIAGNOSIS — M25532 Pain in left wrist: Secondary | ICD-10-CM | POA: Diagnosis not present

## 2020-11-03 DIAGNOSIS — J309 Allergic rhinitis, unspecified: Secondary | ICD-10-CM | POA: Diagnosis not present

## 2020-11-03 DIAGNOSIS — M7071 Other bursitis of hip, right hip: Secondary | ICD-10-CM | POA: Diagnosis not present

## 2020-11-03 DIAGNOSIS — F339 Major depressive disorder, recurrent, unspecified: Secondary | ICD-10-CM | POA: Diagnosis not present

## 2020-11-03 DIAGNOSIS — M25551 Pain in right hip: Secondary | ICD-10-CM | POA: Diagnosis not present

## 2020-11-03 DIAGNOSIS — R531 Weakness: Secondary | ICD-10-CM | POA: Diagnosis not present

## 2020-11-03 DIAGNOSIS — M545 Low back pain, unspecified: Secondary | ICD-10-CM | POA: Diagnosis not present

## 2020-11-03 DIAGNOSIS — E1169 Type 2 diabetes mellitus with other specified complication: Secondary | ICD-10-CM | POA: Diagnosis not present

## 2020-11-04 DIAGNOSIS — B029 Zoster without complications: Secondary | ICD-10-CM | POA: Diagnosis not present

## 2020-11-07 ENCOUNTER — Encounter: Payer: Self-pay | Admitting: Hematology and Oncology

## 2020-11-07 ENCOUNTER — Inpatient Hospital Stay (INDEPENDENT_AMBULATORY_CARE_PROVIDER_SITE_OTHER): Payer: BC Managed Care – PPO | Admitting: Hematology and Oncology

## 2020-11-07 ENCOUNTER — Telehealth: Payer: Self-pay | Admitting: Hematology and Oncology

## 2020-11-07 ENCOUNTER — Other Ambulatory Visit: Payer: Self-pay

## 2020-11-07 ENCOUNTER — Inpatient Hospital Stay: Payer: BC Managed Care – PPO | Attending: Oncology

## 2020-11-07 ENCOUNTER — Other Ambulatory Visit: Payer: Self-pay | Admitting: Hematology and Oncology

## 2020-11-07 VITALS — BP 144/88 | HR 89 | Temp 98.3°F | Resp 18 | Ht 65.0 in | Wt 247.9 lb

## 2020-11-07 DIAGNOSIS — C50912 Malignant neoplasm of unspecified site of left female breast: Secondary | ICD-10-CM

## 2020-11-07 DIAGNOSIS — R9389 Abnormal findings on diagnostic imaging of other specified body structures: Secondary | ICD-10-CM

## 2020-11-07 DIAGNOSIS — M545 Low back pain, unspecified: Secondary | ICD-10-CM | POA: Diagnosis not present

## 2020-11-07 DIAGNOSIS — C773 Secondary and unspecified malignant neoplasm of axilla and upper limb lymph nodes: Secondary | ICD-10-CM | POA: Diagnosis not present

## 2020-11-07 DIAGNOSIS — M25551 Pain in right hip: Secondary | ICD-10-CM | POA: Diagnosis not present

## 2020-11-07 DIAGNOSIS — Z170 Estrogen receptor positive status [ER+]: Secondary | ICD-10-CM | POA: Diagnosis not present

## 2020-11-07 DIAGNOSIS — M7071 Other bursitis of hip, right hip: Secondary | ICD-10-CM | POA: Diagnosis not present

## 2020-11-07 DIAGNOSIS — R531 Weakness: Secondary | ICD-10-CM | POA: Diagnosis not present

## 2020-11-07 DIAGNOSIS — R262 Difficulty in walking, not elsewhere classified: Secondary | ICD-10-CM | POA: Diagnosis not present

## 2020-11-07 LAB — CBC AND DIFFERENTIAL
HCT: 40 (ref 36–46)
Hemoglobin: 13.2 (ref 12.0–16.0)
Neutrophils Absolute: 4.73
Platelets: 274 (ref 150–399)
WBC: 6.3

## 2020-11-07 LAB — BASIC METABOLIC PANEL
BUN: 25 — AB (ref 4–21)
CO2: 23 — AB (ref 13–22)
Chloride: 106 (ref 99–108)
Creatinine: 1.1 (ref 0.5–1.1)
Glucose: 132
Potassium: 3.8 (ref 3.4–5.3)
Sodium: 138 (ref 137–147)

## 2020-11-07 LAB — CBC
MCV: 87 (ref 81–99)
RBC: 4.56 (ref 3.87–5.11)

## 2020-11-07 LAB — HEPATIC FUNCTION PANEL
ALT: 19 (ref 7–35)
AST: 19 (ref 13–35)
Alkaline Phosphatase: 140 — AB (ref 25–125)
Bilirubin, Total: 0.4

## 2020-11-07 LAB — COMPREHENSIVE METABOLIC PANEL
Albumin: 4.3 (ref 3.5–5.0)
Calcium: 8.7 (ref 8.7–10.7)

## 2020-11-07 NOTE — Progress Notes (Unsigned)
ttt

## 2020-11-07 NOTE — Progress Notes (Signed)
[facility]  [address] [place],  [place]  [address] [phone]  Clinic Day:  11/07/2020  Referring physician: Marco Collie, MD     CHIEF COMPLAINT:  CC: A 55 year old female with history of clinical stage IIB hormone receptor positive left breast cancer here to review imaging and 3 month evaluation  Current Treatment:  Tamoxifen 20 mg daily starting at the end of December  HISTORY OF PRESENT ILLNESS:  Laurie Simmons is a 55 y.o. female with clinical stage IIB (TX N2 M0) hormone receptor positive left breast cancer with left axillary adenopathy, but no obvious breast primary diagnosed in February.  She developed left axillary lymphadenopathy in December 2020 which did not improve with antibiotics.  Bilateral diagnostic mammogram and left breast ultrasound in January revealed 4 enlarged lymph nodes in the left axilla with the largest measuring 5.5 cm.  Ultrasound guided biopsy in February revealed poorly differentiated carcinoma.  Immunophenotype is consistent with metastatic carcinoma and with GATA-3 positivity, which is most consistent with metastatic breast carcinoma.  Estrogen receptors were positive at 95% and progesterone receptors were positive at 50.  HER2 was negative.  Ki67 was 20%.  Bilateral breast MRI in March did not reveal any evidence of primary malignancy.  It also revealed left axillary lymphadenopathy with 6 enlarged lymph nodes, the largest measuring 3 x 6 cm.  She has been seen by Dr. Noberto Retort to discuss mastectomy and lymph node dissection versus breast conservation.  She had been scheduled for a hysterectomy due to a large uterine fibroid measuring 9.8 cm, as well as uterine enlargement with Dr. Marvel Plan around the time of her diagnosis, so this has now been put on hold.  She had been on Provera 10 mg daily.  Provera was subsequently increased to 20 mg daily due to persistent heavy vaginal bleeding.  MRI brain did not reveal any metastatic  disease.  PET scan revealed enlarged hypermetabolic left subpectoral and axillary adenopathy, with an index node measuring 3.3 cm in short axis with maximum SUV 6.7.  No other hypermetabolic activity was observed.  Echocardiogram revealed ejection fraction between 60 and 65%.   She is receiving neoadjuvant chemotherapy with docetaxel/doxorubicin/cyclophosphamide (TAC) and had her 1st cycle on March 30th and has tolerated this fairly well.  She had worsening depression, being managed by her psychiatrist and Dr. Nyra Capes.  She reported insomnia so Dr. Nyra Capes had placed her on olanzapine 5 mg at bedtime.  The patient felt that this was too sedating, so we recommended she discuss this with Dr. Nyra Capes.  We had decreased her Provera to 10 mg daily, as her vaginal bleeding had improved.  She had a repeat echocardiogram on June 17th, which revealed normal left ventricular size and function with an ejection fraction of 60-65%, which was stable.  She completed 6 cycles of TAC chemotherapy in mid July.  CT chest, abdomen and pelvis in August did not reveal any evidence of metastatic disease.  Repeat MRI breast in August did not reveal any disease within the breast.  The left axillary lymph nodes had decreased by about 50%.  She underwent left mastectomy and axillary dissection on September 1st with Dr. Noberto Retort.  The final pathology did not reveal any malignancy within the breast.  9/26 lymph nodes were positive for metastasis, the largest measuring 15 mm.  She is premenopausal.  She was referred for post mastectomy radiation and completed this on November 23rd.  She was taking NSAID's for her back pain  but I had her stop that last month due to a rise in the creatinine to 1.6.  Her baseline creatinine has been mildly elevated at 1.3.  Bone density scan from September was normal.  At the end of December we had her start tamoxifen.    INTERVAL HISTORY:  Laurie Simmons is here for routine follow up and started tamoxifen daily at the end  of December.  She has been doing well since last visit until this weekend when she began feeling what she felt to be a shingles outbreak. She does in fact now have a 2 in x 2 in round area of blisters to her right abdomen. She was started on Valcyclovir Sunday, three times daily for one week per Urgent Care. She reports doing better with her tamoxifen other than hot flashes. She reported right hip, leg and knee pain last week and imaging was obtained. These x-rays revealed  a rounded calcific density lateral and anterior to the distal right femur which corresponds in location to area of abnormal uptake seen on bone scan. Calcified soft tissue metastasis cannot be excluded. MRI is recommended for further determination. She has been seeing an orthopedic doctor as well who started her on physical therapy. I did advise to hold off until we obtain these images. She continues to have depression and anxiety for which she is on Effexor. Her psychiatrist is managing this for her. She has an appointment scheduled for tomorrow. She reports some neuropathy beginning in her hands and feet. She denies fever, chills, nausea or vomiting. She denies shortness of breath, chest pain or cough. She denies issue with bowel or bladder. CBC and CMP are unremarkable today.   REVIEW OF SYSTEMS:  Review of Systems  Constitutional: Negative for appetite change, chills, diaphoresis, fatigue, fever and unexpected weight change.  HENT:   Negative for hearing loss, lump/mass, mouth sores, nosebleeds, sore throat, tinnitus, trouble swallowing and voice change.   Eyes: Negative for eye problems and icterus.  Respiratory: Negative for chest tightness, cough, hemoptysis, shortness of breath and wheezing.   Cardiovascular: Negative for chest pain, leg swelling and palpitations.  Gastrointestinal: Negative for abdominal distention, abdominal pain, blood in stool, constipation, diarrhea, nausea, rectal pain and vomiting.  Endocrine: Positive  for hot flashes.  Genitourinary: Negative for bladder incontinence, difficulty urinating, dyspareunia, dysuria, frequency, hematuria and nocturia.   Musculoskeletal: Negative for arthralgias, back pain, flank pain, gait problem, myalgias, neck pain and neck stiffness.       Right hip pain  Skin: Positive for rash. Negative for itching and wound.  Neurological: Positive for numbness. Negative for dizziness, extremity weakness, gait problem, headaches, light-headedness, seizures and speech difficulty.  Hematological: Negative for adenopathy. Does not bruise/bleed easily.  Psychiatric/Behavioral: Positive for depression. Negative for confusion, decreased concentration, sleep disturbance and suicidal ideas. The patient is not nervous/anxious.      VITALS:  Blood pressure (!) 144/88, pulse 89, temperature 98.3 F (36.8 C), temperature source Oral, resp. rate 18, height _0  (1.651 m), weight 247 lb 14.4 oz (112.4 kg), SpO2 97 %.  Wt Readings from Last 3 Encounters:  11/07/20 247 lb 14.4 oz (112.4 kg)  08/18/20 239 lb 12.8 oz (108.8 kg)  08/09/20 242 lb 1.6 oz (109.8 kg)    Body mass index is 41.25 kg/m.  Performance status (ECOG): 1 - Symptomatic but completely ambulatory  PHYSICAL EXAM:  Physical Exam Constitutional:      General: She is not in acute distress.    Appearance: Normal appearance.  She is normal weight. She is not ill-appearing, toxic-appearing or diaphoretic.  HENT:     Head: Normocephalic and atraumatic.     Nose: Nose normal. No congestion or rhinorrhea.     Mouth/Throat:     Mouth: Mucous membranes are moist.     Pharynx: Oropharynx is clear. No oropharyngeal exudate or posterior oropharyngeal erythema.  Eyes:     General: No scleral icterus.       Right eye: No discharge.        Left eye: No discharge.     Extraocular Movements: Extraocular movements intact.     Conjunctiva/sclera: Conjunctivae normal.     Pupils: Pupils are equal, round, and reactive to light.   Neck:     Vascular: No carotid bruit.  Cardiovascular:     Rate and Rhythm: Normal rate and regular rhythm.     Heart sounds: No murmur heard. No friction rub. No gallop.   Pulmonary:     Effort: Pulmonary effort is normal. No respiratory distress.     Breath sounds: Normal breath sounds. No stridor. No wheezing, rhonchi or rales.  Chest:     Chest wall: No mass, lacerations, deformity, swelling, tenderness, crepitus or edema. There is no dullness to percussion.  Breasts: Breasts are symmetrical.     Right: Normal. No swelling, bleeding, inverted nipple, mass, nipple discharge, skin change, tenderness, axillary adenopathy or supraclavicular adenopathy.     Left: Absent. No mass, skin change, tenderness, axillary adenopathy or supraclavicular adenopathy.    Abdominal:     General: Abdomen is flat. Bowel sounds are normal. There is no distension.     Palpations: There is no mass.     Tenderness: There is no abdominal tenderness. There is no right CVA tenderness, left CVA tenderness, guarding or rebound.     Hernia: No hernia is present.  Musculoskeletal:        General: No swelling, tenderness, deformity or signs of injury. Normal range of motion.     Cervical back: Normal range of motion and neck supple. No rigidity or tenderness.     Right lower leg: No edema.     Left lower leg: No edema.  Lymphadenopathy:     Cervical: No cervical adenopathy.     Upper Body:     Right upper body: No supraclavicular, axillary or pectoral adenopathy.     Left upper body: No supraclavicular, axillary or pectoral adenopathy.  Skin:    General: Skin is warm and dry.     Capillary Refill: Capillary refill takes less than 2 seconds.     Coloration: Skin is not jaundiced or pale.     Findings: Rash present. No bruising, erythema or lesion.     Comments: 2 inch x 2 inch round area of blisters  Neurological:     General: No focal deficit present.     Mental Status: She is alert and oriented to  person, place, and time. Mental status is at baseline.     Cranial Nerves: No cranial nerve deficit.     Sensory: No sensory deficit.     Motor: No weakness.     Coordination: Coordination normal.     Gait: Gait normal.     Deep Tendon Reflexes: Reflexes normal.  Psychiatric:        Mood and Affect: Mood normal.        Behavior: Behavior normal.        Thought Content: Thought content normal.  Judgment: Judgment normal.     LABS:   CBC Latest Ref Rng & Units 11/07/2020 08/09/2020 06/26/2020  WBC - 6.3 7.6 6.5  Hemoglobin 12.0 - 16.0 13.2 15.3 14.6  Hematocrit 36 - 46 40 44 43  Platelets 150 - 399 274 281 300   CMP Latest Ref Rng & Units 11/07/2020 08/09/2020 06/26/2020  Glucose 70 - 99 mg/dL - - -  BUN 4 - 21 25(A) 12 19  Creatinine 0.5 - 1.1 1.1 1.4(A) 1.6(A)  Sodium 137 - 147 138 136(A) 140  Potassium 3.4 - 5.3 3.8 3.8 3.6  Chloride 99 - 108 106 102 106  CO2 13 - 22 23(A) 26(A) 23(A)  Calcium 8.7 - 10.7 8.7 9.5 9.6  Total Protein 6.0 - 8.3 g/dL - - -  Total Bilirubin 0.3 - 1.2 mg/dL - - -  Alkaline Phos 25 - 125 140(A) 84 88  AST 13 - 35 _0 ALT 7 - 35 _1 Lab Results  Component Value Date   LDH 185 10/20/2008     STUDIES:  Exam(s): [id number] RAD/DG KNEE COMPLETE 4+V-R CLINICAL DATA:  Abnormal bone scan.   EXAM: RIGHT KNEE - COMPLETE 4+ VIEW  COMPARISON:  September 06, 2020.  FINDINGS: No evidence of fracture, dislocation, or joint effusion. Severe narrowing of patellofemoral space is noted. There is noted a rounded calcific density in the area of abnormal uptake on prior bone scan which is anterior and lateral to the distal right femur.  IMPRESSION: Rounded calcific density is seen in anterior and lateral to the distal right femur which corresponds in location to abnormality seen on prior bone scan. Calcified soft tissue metastatic lesion cannot be excluded. MRI may be performed for further evaluation.   Electronically Signed   By:  Marijo Conception M.D.   On: 11/01/2020 10:41  Exam(s): [id number] RAD/DG FEMUR 2VR CLINICAL DATA:  Bone scan abnormality.  EXAM: RIGHT FEMUR - 2 VIEW  COMPARISON:  September 06, 2020.  FINDINGS: There is no evidence of fracture or other focal bone lesions. There is noted a rounded calcific density lateral and anterior to the distal right femur which corresponds in location to area of abnormal uptake on bone scan.  IMPRESSION: There is noted a rounded calcific density lateral and anterior to the distal right femur which corresponds in location to area of abnormal uptake seen on bone scan. Calcified soft tissue metastasis cannot be excluded. MRI may be performed for further evaluation.   Electronically Signed   By: Marijo Conception M.D.   On: 11/01/2020 10:37 Exam(s): [id number] RAD/DG HIP COMPLETE 2+V-R CLINICAL DATA:  Abnormal bone scan.  History of breast cancer.  EXAM: RIGHT HIP (WITH PELVIS) 2-3 VIEWS  COMPARISON:  September 06, 2020.  FINDINGS: No evidence of hip fracture or dislocation. No pelvic fracture identified. Degenerative changes are seen involving the pubic symphysis. Stable sclerotic density is seen in right acetabular region..  IMPRESSION: Stable sclerotic density is seen in right acetabular region. This may simply represent benign enostosis, but continued follow-up is recommended to rule out the possibility of metastatic disease. There was no abnormal uptake in this area on prior bone scan.   Electronically Signed   By: Marijo Conception M.D.   On: 11/01/2020 10:40   Allergies:  Allergies  Allergen Reactions  . Effexor [Venlafaxine] Palpitations  . Nsaids Other (See Comments)    Pt reports kidney disease Other reaction(s): Other (See Comments) Pt reports  kidney disease Pt reports kidney disease    Current Medications: Current Outpatient Medications  Medication Sig Dispense Refill  . albuterol (VENTOLIN HFA) 108 (90 Base) MCG/ACT inhaler  INAHLE 2 PUFFS INTO LUNGS EVERY 4 TO 6 HOURS AS NEEDED FOR COUGH OR WHEEZE 18 g 1  . Azelastine HCl 0.15 % SOLN Can use one spray in each nostril two times daily if needed. 90 mL 1  . Biotin 2500 MCG CAPS Take 1 tablet by mouth daily.    . citalopram (CELEXA) 20 MG tablet TAKE 1/2 TABLET(10 MG) BY MOUTH DAILY FOR 7 DAYS. INCREASE TO 1 TABLET 20 MG DAILY 30 tablet 0  . clorazepate (TRANXENE) 3.75 MG tablet Take one tablet up to four times daily as needed for anxiety/panic attacks. 120 tablet 2  . docusate sodium (COLACE) 50 MG capsule Take 50 mg by mouth 2 (two) times daily.    . famotidine (PEPCID) 40 MG tablet TAKE 1 TABLET BY MOUTH TWICE DAILY AS DIRECTED 60 tablet 5  . ferrous sulfate 324 MG TBEC Take 65 mg by mouth. Once daily    . fluticasone (FLONASE) 50 MCG/ACT nasal spray     . folic acid (FOLVITE) 1 MG tablet TAKE 1 TABLET BY MOUTH DAILY 30 tablet 5  . ipratropium (ATROVENT) 0.06 % nasal spray Use 2 sprays in each nostril every 6 hours to dry up nose if needed 15 mL 5  . loratadine (CLARITIN) 10 MG tablet Take 10 mg by mouth daily.    . ondansetron (ZOFRAN) 4 MG tablet Take 4 mg by mouth every 4 (four) hours as needed.    . potassium chloride (KLOR-CON) 10 MEQ tablet TAKE 1 TABLET BY MOUTH TWICE DAILY 60 tablet 1  . prochlorperazine (COMPAZINE) 10 MG tablet TAKE 1 TABLET BY MOUTH EVERY 6 HOURS AS NEEDED FOR NAUSEA 90 tablet 1  . rosuvastatin (CRESTOR) 10 MG tablet Take 10 mg by mouth. Takes twice a week  1  . Semaglutide,0.25 or 0.5MG/DOS, (OZEMPIC, 0.25 OR 0.5 MG/DOSE,) 2 MG/1.5ML SOPN Inject 0.5 mg into the skin. Once weekly    . SYMBICORT 160-4.5 MCG/ACT inhaler INHALE 2 PUFFS BY MOUTH TWICE DAILY TO PREVENT COUGH OR WHEEZE. RINSE, GARGLE AND SPIT AFTER USE 10.2 g 2  . tamoxifen (NOLVADEX) 20 MG tablet Take 1 tablet (20 mg total) by mouth daily. 30 tablet 5  . topiramate (TOPAMAX) 50 MG tablet Take 1 tablet (50 mg total) by mouth daily. 90 tablet 3  . traZODone (DESYREL) 50 MG tablet  Take 1 tablet (50 mg total) by mouth at bedtime. 30 tablet 2  . UNABLE TO FIND Allergy injections for cats, grass, and dust.    . valsartan-hydrochlorothiazide (DIOVAN-HCT) 320-25 MG tablet Take 1 tablet by mouth daily.  0  . VIIBRYD 40 MG TABS Take 1 tablet (40 mg total) by mouth daily. 90 tablet 1  . vitamin B-12 (CYANOCOBALAMIN) 500 MCG tablet Take 500 mcg by mouth daily.     No current facility-administered medications for this visit.     ASSESSMENT & PLAN:   Assessment:   1. Clinical stage IIB (TXN2M0), grade 3, ductal carcinoma with no breast primary.  She received neoadjuvant chemotherapy with docetaxel/doxorubicin/cyclophosphamide completed in July.  She has now undergone left mastectomy and axillary dissection, with no breast primary found and 9/26 lymph nodes positive for metastasis. She completed adjuvant radiation at the end of November, and was placed on hormonal therapy with tamoxifen at the end of December.  When she undergoes  bilateral salpingo oophorectomy, we could consider an aromatase inhibitor.  Baseline bone density scan was completely normal. She continues tamoxifen without difficulties.  2. Large uterine fibroid.  She is planned for a hysterectomy/BSO this year.  She continues to have sporadic menstrual cycles.  3. Depression and anxiety, managed by Dr. Nyra Capes and her psychiatrist.  The tamoxifen can be lowered by most anti-depressant medications including the Wellbutrin. She has been placed on Celexa per her physician. She has an appointment tomorrow to discuss increasing the dose as she remains depressed.  4.  Small hard nodule in the mid mastectomy incision, stable. This is unchanged.  5. Right hip pain.There is noted a rounded calcific density lateral and anterior to the distal right femur which corresponds in location to area of abnormal uptake seen on bone scan. Calcified soft tissue metastasis cannot be excluded.. We will obtain MRI imaging as recommended on plain  film report  6.Shingles. She is taking valcyclovir.   7. Neuropathy. We will continue to monitor. She does not wish to treat at this time.  Plan: She will continue with tamoxifen daily. We will obtain MRI imaging to assess calcified density noted on x-ray. She will discuss with her psychiatrist tomorrow changing her Celexa dose. We will review MRI imaging once obtained. She will continue valcyclovir for entire course.She will return to the office in 4 weeks for repeat evaluation.    She verbalizes understanding of and agreement to the plans discussed today. She knows to call the office should any new questions or concerns arise.    Melodye Ped, NP [facility] [address] [place] [address] Dept: [phone] Dept Fax: [phone]

## 2020-11-07 NOTE — Telephone Encounter (Signed)
Per 4/26 los next appt scheduled and confirmed by patient

## 2020-11-08 ENCOUNTER — Encounter: Payer: Self-pay | Admitting: Adult Health

## 2020-11-08 ENCOUNTER — Telehealth (INDEPENDENT_AMBULATORY_CARE_PROVIDER_SITE_OTHER): Payer: 59 | Admitting: Adult Health

## 2020-11-08 DIAGNOSIS — F39 Unspecified mood [affective] disorder: Secondary | ICD-10-CM

## 2020-11-08 DIAGNOSIS — F331 Major depressive disorder, recurrent, moderate: Secondary | ICD-10-CM | POA: Diagnosis not present

## 2020-11-08 DIAGNOSIS — F411 Generalized anxiety disorder: Secondary | ICD-10-CM | POA: Diagnosis not present

## 2020-11-08 DIAGNOSIS — F410 Panic disorder [episodic paroxysmal anxiety] without agoraphobia: Secondary | ICD-10-CM | POA: Diagnosis not present

## 2020-11-08 DIAGNOSIS — J3081 Allergic rhinitis due to animal (cat) (dog) hair and dander: Secondary | ICD-10-CM | POA: Diagnosis not present

## 2020-11-08 MED ORDER — ARIPIPRAZOLE 2 MG PO TABS: 2.0000 mg | ORAL_TABLET | Freq: Every day | ORAL | 2 refills | Status: DC

## 2020-11-08 MED ORDER — TRAZODONE HCL 50 MG PO TABS: ORAL_TABLET | ORAL | 5 refills | Status: DC

## 2020-11-08 MED ORDER — CLORAZEPATE DIPOTASSIUM 3.75 MG PO TABS: ORAL_TABLET | ORAL | 2 refills | Status: DC

## 2020-11-08 NOTE — Progress Notes (Signed)
VIAL EXP 11-08-21 

## 2020-11-08 NOTE — Progress Notes (Signed)
Laurie Simmons [id number] 1966-06-22 55 y.o.  Virtual Visit via Telephone Note  I connected with pt on 11/08/20 at  9:40 AM EDT by telephone and verified that I am speaking with the correct person using two identifiers.   I discussed the limitations, risks, security and privacy concerns of performing an evaluation and management service by telephone and the availability of in person appointments. I also discussed with the patient that there may be a patient responsible charge related to this service. The patient expressed understanding and agreed to proceed.   I discussed the assessment and treatment plan with the patient. The patient was provided an opportunity to ask questions and all were answered. The patient agreed with the plan and demonstrated an understanding of the instructions.   The patient was advised to call back or seek an in-person evaluation if the symptoms worsen or if the condition fails to improve as anticipated.  I provided 30 minutes of non-face-to-face time during this encounter.  The patient was located at home.  The provider was located at [place].   Laurie Gell, NP   Subjective:   Patient ID:  Laurie Simmons is a 55 y.o. (DOB [birth year]) female.  Chief Complaint: No chief complaint on file.   HPI  Laurie Simmons presents for follow-up of anxiety, panic attacks, depression, and mood disorder.  Describes mood today as "ok". Tearful at times. Pleasant. Mood symptoms - reports depression, anxiety, and irritability. Biopsy of tongue last week - ulcer. Recently diagnosed with shingles. Concerned about a "spot" on her knee and hip - awaiting MRI. Stating "I feel like I'm at the end of my rope". Increased anxiety. Crying all the time. Hyperventilating. Difficulties staying calm. Mother in law very "needy" - her husband passed away a year and a half ago. Husband going back and forth and she needs him with her. Planning to see a therapist. Varying interest and  motivation. Taking medications as prescribed. Energy levels varies.. Active, does not have a regular exercise routine with current physical disabilities..  Enjoys some usual interests and activities. Married. Lives with husband and their two children - son 28 and daughter 26. Mostly staying home. Appetite adequate. Weight gain - 240 pounds. Sleeps better some nights than others. Averages 6 to 7 hours. Napping during the day occasionally.  Focus and concentration difficulties. Completing tasks. Managing aspects of household. Currently disabled with cancer treatment. Denies SI or HI.  Denies AH or VH.    Review of Systems:  Review of Systems  Musculoskeletal: Negative for gait problem.  Neurological: Negative for tremors.  Psychiatric/Behavioral:       Please refer to HPI    Medications: I have reviewed the patient's current medications.  Current Outpatient Medications  Medication Sig Dispense Refill  . ARIPiprazole (ABILIFY) 2 MG tablet Take 1 tablet (2 mg total) by mouth daily. 30 tablet 2  . albuterol (VENTOLIN HFA) 108 (90 Base) MCG/ACT inhaler INAHLE 2 PUFFS INTO LUNGS EVERY 4 TO 6 HOURS AS NEEDED FOR COUGH OR WHEEZE 18 g 1  . Azelastine HCl 0.15 % SOLN Can use one spray in each nostril two times daily if needed. 90 mL 1  . Biotin 2500 MCG CAPS Take 1 tablet by mouth daily.    . clorazepate (TRANXENE) 3.75 MG tablet Take one tablet up to four times daily as needed for anxiety/panic attacks. 120 tablet 2  . docusate sodium (COLACE) 50 MG capsule Take 50 mg by mouth 2 (two) times  daily.    . famotidine (PEPCID) 40 MG tablet TAKE 1 TABLET BY MOUTH TWICE DAILY AS DIRECTED 60 tablet 5  . ferrous sulfate 324 MG TBEC Take 65 mg by mouth. Once daily    . fluticasone (FLONASE) 50 MCG/ACT nasal spray     . folic acid (FOLVITE) 1 MG tablet TAKE 1 TABLET BY MOUTH DAILY 30 tablet 5  . ipratropium (ATROVENT) 0.06 % nasal spray Use 2 sprays in each nostril every 6 hours to dry up nose if needed  15 mL 5  . loratadine (CLARITIN) 10 MG tablet Take 10 mg by mouth daily.    . ondansetron (ZOFRAN) 4 MG tablet Take 4 mg by mouth every 4 (four) hours as needed.    . potassium chloride (KLOR-CON) 10 MEQ tablet TAKE 1 TABLET BY MOUTH TWICE DAILY 60 tablet 1  . prochlorperazine (COMPAZINE) 10 MG tablet TAKE 1 TABLET BY MOUTH EVERY 6 HOURS AS NEEDED FOR NAUSEA 90 tablet 1  . rosuvastatin (CRESTOR) 10 MG tablet Take 10 mg by mouth. Takes twice a week  1  . Semaglutide,0.25 or 0.5MG/DOS, (OZEMPIC, 0.25 OR 0.5 MG/DOSE,) 2 MG/1.5ML SOPN Inject 0.5 mg into the skin. Once weekly    . SYMBICORT 160-4.5 MCG/ACT inhaler INHALE 2 PUFFS BY MOUTH TWICE DAILY TO PREVENT COUGH OR WHEEZE. RINSE, GARGLE AND SPIT AFTER USE 10.2 g 2  . tamoxifen (NOLVADEX) 20 MG tablet Take 1 tablet (20 mg total) by mouth daily. 30 tablet 5  . topiramate (TOPAMAX) 50 MG tablet Take 1 tablet (50 mg total) by mouth daily. 90 tablet 3  . traZODone (DESYREL) 50 MG tablet Take one to two tablets at bedtime. 60 tablet 5  . UNABLE TO FIND Allergy injections for cats, grass, and dust.    . valsartan-hydrochlorothiazide (DIOVAN-HCT) 320-25 MG tablet Take 1 tablet by mouth daily.  0  . VIIBRYD 40 MG TABS Take 1 tablet (40 mg total) by mouth daily. 90 tablet 1  . vitamin B-12 (CYANOCOBALAMIN) 500 MCG tablet Take 500 mcg by mouth daily.     No current facility-administered medications for this visit.    Medication Side Effects: None  Allergies:  Allergies  Allergen Reactions  . Effexor [Venlafaxine] Palpitations  . Nsaids Other (See Comments)    Pt reports kidney disease Other reaction(s): Other (See Comments) Pt reports kidney disease Pt reports kidney disease    Past Medical History:  Diagnosis Date  . Anxiety   . Arthritis   . Asthma   . Breast cancer ([place]) 2021  . Carpal tunnel syndrome   . Carpal tunnel syndrome    bilateral  . Chronic kidney disease    stage 3  . Depression   . Diabetes ([place])   . GERD  (gastroesophageal reflux disease)   . Hypertension   . Irritable bowel syndrome   . Severe anemia 016010    Family History  Problem Relation Age of Onset  . Pulmonary fibrosis Father   . Cancer Maternal Grandmother        carcinoma of the vulva and possible ovarian cancer  . Prostate cancer Maternal Grandfather   . Lung cancer Paternal Grandfather        heavy smoker    Social History   Socioeconomic History  . Marital status: Married    Spouse name: Abbe Amsterdam  . Number of children: 2  . Years of education: Not on file  . Highest education level: Not on file  Occupational History  . Not on  file  Tobacco Use  . Smoking status: Never Smoker  . Smokeless tobacco: Never Used  Vaping Use  . Vaping Use: Never used  Substance and Sexual Activity  . Alcohol use: No  . Drug use: No  . Sexual activity: Yes  Other Topics Concern  . Not on file  Social History Narrative  . Not on file   Social Determinants of Health   Financial Resource Strain: Not on file  Food Insecurity: Not on file  Transportation Needs: Not on file  Physical Activity: Not on file  Stress: Not on file  Social Connections: Not on file  Intimate Partner Violence: Not on file    Past Medical History, Surgical history, Social history, and Family history were reviewed and updated as appropriate.   Please see review of systems for further details on the patient's review from today.   Objective:   Physical Exam:  There were no vitals taken for this visit.  Physical Exam Neurological:     Mental Status: She is alert and oriented to person, place, and time.     Cranial Nerves: No dysarthria.  Psychiatric:        Attention and Perception: Attention and perception normal.        Mood and Affect: Mood normal.        Speech: Speech normal.        Behavior: Behavior is cooperative.        Thought Content: Thought content normal. Thought content is not paranoid or delusional. Thought content does not include  homicidal or suicidal ideation. Thought content does not include homicidal or suicidal plan.        Cognition and Memory: Cognition and memory normal.        Judgment: Judgment normal.     Comments: Insight intact     Lab Review:     Component Value Date/Time   NA 138 11/07/2020 0000   K 3.8 11/07/2020 0000   CL 106 11/07/2020 0000   CO2 23 (A) 11/07/2020 0000   GLUCOSE 97 10/21/2008 0545   BUN 25 (A) 11/07/2020 0000   CREATININE 1.1 11/07/2020 0000   CREATININE 0.76 10/20/2008 2105   CALCIUM 8.7 11/07/2020 0000   PROT 6.0 10/20/2008 2105   ALBUMIN 4.3 11/07/2020 0000   AST 19 11/07/2020 0000   ALT 19 11/07/2020 0000   ALKPHOS 140 (A) 11/07/2020 0000   BILITOT 0.2 (L) 10/20/2008 2105   GFRNONAA >60 10/20/2008 2105   GFRAA  10/20/2008 2105    >60        The eGFR has been calculated using the MDRD equation. This calculation has not been validated in all clinical situations. eGFR's persistently <60 mL/min signify possible Chronic Kidney Disease.       Component Value Date/Time   WBC 6.3 11/07/2020 0000   WBC 12.3 (H) 10/22/2008 0530   RBC 4.56 11/07/2020 0000   HGB 13.2 11/07/2020 0000   HGB 11.6 12/23/2018 1146   HCT 40 11/07/2020 0000   HCT 36.4 12/23/2018 1146   PLT 274 11/07/2020 0000   MCV 87 11/07/2020 0000   MCH 22.3 (L) 12/23/2018 1146   MCHC 31.9 12/23/2018 1146   MCHC 33.7 10/22/2008 0530   RDW 16.9 (H) 12/23/2018 1146   LYMPHSABS 2.8 12/23/2018 1146   EOSABS 0.2 12/23/2018 1146   BASOSABS 0.1 12/23/2018 1146    No results found for: POCLITH, LITHIUM   No results found for: PHENYTOIN, PHENOBARB, VALPROATE, CBMZ   .res Assessment: Plan:  Plan:  1. Viibryd 4m daily 2. Tranxene 3.781mup to 4 times daily 3. Topamax 50 mg daily 4. Add Abilify 53m24maily 5. D/C Celexa 89m353m10mg41mly x 7 days, then leave off - also taking Viibryd  [facility].  RTC 4 weeks  Patient advised to contact office  with any questions, adverse effects, or acute worsening in signs and symptoms.  Discussed potential benefits, risk, and side effects of benzodiazepines to include potential risk of tolerance and dependence, as well as possible drowsiness. Advised patient not to drive if experiencing drowsiness and to take lowest possible effective dose to minimize risk of dependence and tolerance.   Diagnoses and all orders for this visit:  Major depressive disorder, recurrent episode, moderate (HCC) -     traZODone (DESYREL) 50 MG tablet; Take one to two tablets at bedtime.  Panic attacks -     clorazepate (TRANXENE) 3.75 MG tablet; Take one tablet up to four times daily as needed for anxiety/panic attacks.  Generalized anxiety disorder -     clorazepate (TRANXENE) 3.75 MG tablet; Take one tablet up to four times daily as needed for anxiety/panic attacks.  Episodic mood disorder (HCC) -     ARIPiprazole (ABILIFY) 2 MG tablet; Take 1 tablet (2 mg total) by mouth daily.    Please see After Visit Summary for patient specific instructions.  Future Appointments  Date Time Provider [place]  9:00 AM [facility] None  12/05/2020  9:30 AM [facility] [facility] None  12/25/2020 10:00 AM Kozlow, Eric Donnamarie [facility] None    No orders of the defined types were placed in this encounter.     -------------------------------

## 2020-11-09 DIAGNOSIS — R531 Weakness: Secondary | ICD-10-CM | POA: Diagnosis not present

## 2020-11-09 DIAGNOSIS — M7071 Other bursitis of hip, right hip: Secondary | ICD-10-CM | POA: Diagnosis not present

## 2020-11-09 DIAGNOSIS — M545 Low back pain, unspecified: Secondary | ICD-10-CM | POA: Diagnosis not present

## 2020-11-09 DIAGNOSIS — R262 Difficulty in walking, not elsewhere classified: Secondary | ICD-10-CM | POA: Diagnosis not present

## 2020-11-09 DIAGNOSIS — M25551 Pain in right hip: Secondary | ICD-10-CM | POA: Diagnosis not present

## 2020-11-10 DIAGNOSIS — M1711 Unilateral primary osteoarthritis, right knee: Secondary | ICD-10-CM | POA: Diagnosis not present

## 2020-11-10 DIAGNOSIS — M25551 Pain in right hip: Secondary | ICD-10-CM | POA: Diagnosis not present

## 2020-11-10 DIAGNOSIS — R9389 Abnormal findings on diagnostic imaging of other specified body structures: Secondary | ICD-10-CM | POA: Diagnosis not present

## 2020-11-13 ENCOUNTER — Other Ambulatory Visit: Payer: Self-pay | Admitting: Hematology and Oncology

## 2020-11-13 DIAGNOSIS — M25551 Pain in right hip: Secondary | ICD-10-CM | POA: Diagnosis not present

## 2020-11-13 DIAGNOSIS — R531 Weakness: Secondary | ICD-10-CM | POA: Diagnosis not present

## 2020-11-13 DIAGNOSIS — M7071 Other bursitis of hip, right hip: Secondary | ICD-10-CM | POA: Diagnosis not present

## 2020-11-13 DIAGNOSIS — M545 Low back pain, unspecified: Secondary | ICD-10-CM | POA: Diagnosis not present

## 2020-11-13 DIAGNOSIS — R262 Difficulty in walking, not elsewhere classified: Secondary | ICD-10-CM | POA: Diagnosis not present

## 2020-11-13 MED ORDER — MEDERMA ADVANCED SCAR GEL EX GEL: 1.0000 "application " | Freq: Every day | CUTANEOUS | 1 refills | Status: DC

## 2020-11-15 ENCOUNTER — Ambulatory Visit (INDEPENDENT_AMBULATORY_CARE_PROVIDER_SITE_OTHER): Payer: BC Managed Care – PPO | Admitting: *Deleted

## 2020-11-15 ENCOUNTER — Other Ambulatory Visit: Payer: Self-pay | Admitting: Allergy and Immunology

## 2020-11-15 DIAGNOSIS — J309 Allergic rhinitis, unspecified: Secondary | ICD-10-CM

## 2020-11-15 DIAGNOSIS — R531 Weakness: Secondary | ICD-10-CM | POA: Diagnosis not present

## 2020-11-15 DIAGNOSIS — M545 Low back pain, unspecified: Secondary | ICD-10-CM | POA: Diagnosis not present

## 2020-11-15 DIAGNOSIS — M7071 Other bursitis of hip, right hip: Secondary | ICD-10-CM | POA: Diagnosis not present

## 2020-11-15 DIAGNOSIS — M25551 Pain in right hip: Secondary | ICD-10-CM | POA: Diagnosis not present

## 2020-11-15 DIAGNOSIS — R262 Difficulty in walking, not elsewhere classified: Secondary | ICD-10-CM | POA: Diagnosis not present

## 2020-11-17 ENCOUNTER — Encounter: Payer: Self-pay | Admitting: Hematology and Oncology

## 2020-11-20 DIAGNOSIS — M7071 Other bursitis of hip, right hip: Secondary | ICD-10-CM | POA: Diagnosis not present

## 2020-11-20 DIAGNOSIS — R262 Difficulty in walking, not elsewhere classified: Secondary | ICD-10-CM | POA: Diagnosis not present

## 2020-11-20 DIAGNOSIS — M545 Low back pain, unspecified: Secondary | ICD-10-CM | POA: Diagnosis not present

## 2020-11-20 DIAGNOSIS — M25551 Pain in right hip: Secondary | ICD-10-CM | POA: Diagnosis not present

## 2020-11-20 DIAGNOSIS — R531 Weakness: Secondary | ICD-10-CM | POA: Diagnosis not present

## 2020-11-22 ENCOUNTER — Encounter: Payer: Self-pay | Admitting: Hematology and Oncology

## 2020-11-22 DIAGNOSIS — M25551 Pain in right hip: Secondary | ICD-10-CM | POA: Diagnosis not present

## 2020-11-22 DIAGNOSIS — R262 Difficulty in walking, not elsewhere classified: Secondary | ICD-10-CM | POA: Diagnosis not present

## 2020-11-22 DIAGNOSIS — Z6841 Body Mass Index (BMI) 40.0 and over, adult: Secondary | ICD-10-CM | POA: Diagnosis not present

## 2020-11-22 DIAGNOSIS — M545 Low back pain, unspecified: Secondary | ICD-10-CM | POA: Diagnosis not present

## 2020-11-22 DIAGNOSIS — R531 Weakness: Secondary | ICD-10-CM | POA: Diagnosis not present

## 2020-11-22 DIAGNOSIS — M7071 Other bursitis of hip, right hip: Secondary | ICD-10-CM | POA: Diagnosis not present

## 2020-11-27 ENCOUNTER — Ambulatory Visit (INDEPENDENT_AMBULATORY_CARE_PROVIDER_SITE_OTHER): Payer: BC Managed Care – PPO | Admitting: *Deleted

## 2020-11-27 DIAGNOSIS — R531 Weakness: Secondary | ICD-10-CM | POA: Diagnosis not present

## 2020-11-27 DIAGNOSIS — J309 Allergic rhinitis, unspecified: Secondary | ICD-10-CM

## 2020-11-27 DIAGNOSIS — M545 Low back pain, unspecified: Secondary | ICD-10-CM | POA: Diagnosis not present

## 2020-11-27 DIAGNOSIS — M25551 Pain in right hip: Secondary | ICD-10-CM | POA: Diagnosis not present

## 2020-11-27 DIAGNOSIS — M7071 Other bursitis of hip, right hip: Secondary | ICD-10-CM | POA: Diagnosis not present

## 2020-11-27 DIAGNOSIS — R262 Difficulty in walking, not elsewhere classified: Secondary | ICD-10-CM | POA: Diagnosis not present

## 2020-11-29 DIAGNOSIS — N912 Amenorrhea, unspecified: Secondary | ICD-10-CM | POA: Diagnosis not present

## 2020-11-29 DIAGNOSIS — M545 Low back pain, unspecified: Secondary | ICD-10-CM | POA: Diagnosis not present

## 2020-11-29 DIAGNOSIS — D251 Intramural leiomyoma of uterus: Secondary | ICD-10-CM | POA: Diagnosis not present

## 2020-11-29 DIAGNOSIS — R262 Difficulty in walking, not elsewhere classified: Secondary | ICD-10-CM | POA: Diagnosis not present

## 2020-11-29 DIAGNOSIS — C773 Secondary and unspecified malignant neoplasm of axilla and upper limb lymph nodes: Secondary | ICD-10-CM | POA: Diagnosis not present

## 2020-11-29 DIAGNOSIS — D252 Subserosal leiomyoma of uterus: Secondary | ICD-10-CM | POA: Diagnosis not present

## 2020-11-29 DIAGNOSIS — M25551 Pain in right hip: Secondary | ICD-10-CM | POA: Diagnosis not present

## 2020-11-29 DIAGNOSIS — Z01419 Encounter for gynecological examination (general) (routine) without abnormal findings: Secondary | ICD-10-CM | POA: Diagnosis not present

## 2020-11-29 DIAGNOSIS — M7071 Other bursitis of hip, right hip: Secondary | ICD-10-CM | POA: Diagnosis not present

## 2020-11-29 DIAGNOSIS — Z124 Encounter for screening for malignant neoplasm of cervix: Secondary | ICD-10-CM | POA: Diagnosis not present

## 2020-12-01 ENCOUNTER — Telehealth: Payer: Self-pay | Admitting: Hematology and Oncology

## 2020-12-01 NOTE — Telephone Encounter (Signed)
Received call from Dr. Marin Roberts on 11/29/20 questioning need for TAH/BSO for breast cancer treatment. Patient has large uterine fibroid and had discussed TAH with Dr. Marvel Plan previously for this and was felt to be perimenopausal. She was tthen was diagnosed with breast cancer. She is on tamoxifen and doing well. She has not had any bleeding since December and is not having any abdominal/pelvic pain. Also, he asks how soon after completion of chemo/XRT is patient at higher risk for complications. After reviewing her chart called him back to say it seems we presumed she would go on to have TAH for the uterine fibroid and recommended BSO with that surgery due to her breast cancer. BSO is not required for treatment purposes. She is at higher risk for complications in the first few months after completion of treatment, but certainly would delay elective surgery for 6 months post treatment. He repeated an [facility] which is pending. He plans to see her for follow in a few months.

## 2020-12-04 NOTE — Progress Notes (Signed)
Laurie Simmons  [address] [place],  [place]  [address] [phone]  Clinic Day:  12/05/2020  Referring physician: Marco Collie, MD     CHIEF COMPLAINT:  CC: A 55 year old female with history of clinical stage IIB hormone receptor positive left breast cancer here to review imaging and 3 month evaluation  Current Treatment:  Tamoxifen 20 mg daily starting at the end of December  HISTORY OF PRESENT ILLNESS:  Laurie Simmons is a 55 y.o. female with clinical stage IIB (TX N2 M0) hormone receptor positive left breast cancer with left axillary adenopathy, but no obvious breast primary diagnosed in February.  She developed left axillary lymphadenopathy in December 2020 which did not improve with antibiotics.  Bilateral diagnostic mammogram and left breast ultrasound in January revealed 4 enlarged lymph nodes in the left axilla with the largest measuring 5.5 cm.  Ultrasound guided biopsy in February revealed poorly differentiated carcinoma.  Immunophenotype is consistent with metastatic carcinoma and with GATA-3 positivity, which is most consistent with metastatic breast carcinoma.  Estrogen receptors were positive at 95% and progesterone receptors were positive at 50.  HER2 was negative.  Ki67 was 20%.  Bilateral breast MRI in March did not reveal any evidence of primary malignancy.  It also revealed left axillary lymphadenopathy with 6 enlarged lymph nodes, the largest measuring 3 x 6 cm.  She has been seen by Dr. Noberto Retort to discuss mastectomy and lymph node dissection versus breast conservation.  She had been scheduled for a hysterectomy due to a large uterine fibroid measuring 9.8 cm, as well as uterine enlargement with Dr. Marvel Plan around the time of her diagnosis, so this has now been put on hold.  She had been on Provera 10 mg daily.  Provera was subsequently increased to 20 mg daily due to persistent heavy vaginal bleeding.  MRI brain did not reveal any metastatic  disease.  PET scan revealed enlarged hypermetabolic left subpectoral and axillary adenopathy, with an index node measuring 3.3 cm in short axis with maximum SUV 6.7.  No other hypermetabolic activity was observed.  Echocardiogram revealed ejection fraction between 60 and 65%.   She is receiving neoadjuvant chemotherapy with docetaxel/doxorubicin/cyclophosphamide (TAC) and had her 1st cycle on March 30th and has tolerated this fairly well.  She had worsening depression, being managed by her psychiatrist and Dr. Nyra Capes.  She reported insomnia so Dr. Nyra Capes had placed her on olanzapine 5 mg at bedtime.  The patient felt that this was too sedating, so we recommended she discuss this with Dr. Nyra Capes.  We had decreased her Provera to 10 mg daily, as her vaginal bleeding had improved.  She had a repeat echocardiogram on June 17th, which revealed normal left ventricular size and function with an ejection fraction of 60-65%, which was stable.  She completed 6 cycles of TAC chemotherapy in mid July.  CT chest, abdomen and pelvis in August did not reveal any evidence of metastatic disease.  Repeat MRI breast in August did not reveal any disease within the breast.  The left axillary lymph nodes had decreased by about 50%.  She underwent left mastectomy and axillary dissection on September 1st with Dr. Noberto Retort.  The final pathology did not reveal any malignancy within the breast.  9/26 lymph nodes were positive for metastasis, the largest measuring 15 mm.  She is premenopausal.  She was referred for post mastectomy radiation and completed this on November 23rd.  She was taking NSAID's for her back pain  but I had her stop that last month due to a rise in the creatinine to 1.6.  Her baseline creatinine has been mildly elevated at 1.3.  Bone density scan from September was normal.  At the end of December we had her start tamoxifen.    INTERVAL HISTORY:  Laurie Simmons is here for 4 week evaluation. She has been well since last visit.  She continues physical therapy for her shoulder and hip. She was previously being treated for shingles and this has almost completely resolved. She continues to have neuropathy, but continues to refuse treatment at this time.  She continues tamoxifen with minimal difficulties including occasional nausea. She denies fever, chills or vomiting. She denies issue with bowel or bladder. She denies shortness of breath, chest pain or cough. Her appetite is good and she is actively trying to lose weight. She is having difficulty with this and it is discouraging to her. We discussed some techniques including increasing her water intake throughout the day. CBC and CMP are unremarkable today.   REVIEW OF SYSTEMS:  Review of Systems  Constitutional: Negative for appetite change, chills, diaphoresis, fatigue, fever and unexpected weight change.  HENT:   Negative for hearing loss, lump/mass, mouth sores, nosebleeds, sore throat, tinnitus, trouble swallowing and voice change.   Eyes: Negative for eye problems and icterus.  Respiratory: Negative for chest tightness, cough, hemoptysis, shortness of breath and wheezing.   Cardiovascular: Negative for chest pain, leg swelling and palpitations.  Gastrointestinal: Negative for abdominal distention, abdominal pain, blood in stool, constipation, diarrhea, nausea, rectal pain and vomiting.  Endocrine: Positive for hot flashes.  Genitourinary: Negative for bladder incontinence, difficulty urinating, dyspareunia, dysuria, frequency, hematuria and nocturia.   Musculoskeletal: Negative for arthralgias, back pain, flank pain, gait problem, myalgias, neck pain and neck stiffness.       Right hip pain  Skin: Positive for rash. Negative for itching and wound.  Neurological: Positive for numbness. Negative for dizziness, extremity weakness, gait problem, headaches, light-headedness, seizures and speech difficulty.  Hematological: Negative for adenopathy. Does not bruise/bleed  easily.  Psychiatric/Behavioral: Positive for depression. Negative for confusion, decreased concentration, sleep disturbance and suicidal ideas. The patient is not nervous/anxious.      VITALS:  Blood pressure 139/89, pulse 97, temperature 98.9 F (37.2 C), temperature source Oral, resp. rate 18, height '5\' 5"'  (1.651 m), weight 248 lb 6.4 oz (112.7 kg), SpO2 97 %.  Wt Readings from Last 3 Encounters:  12/05/20 248 lb 6.4 oz (112.7 kg)  11/07/20 247 lb 14.4 oz (112.4 kg)  08/18/20 239 lb 12.8 oz (108.8 kg)    Body mass index is 41.34 kg/m.  Performance status (ECOG): 1 - Symptomatic but completely ambulatory  PHYSICAL EXAM:  Physical Exam Constitutional:      General: She is not in acute distress.    Appearance: Normal appearance. She is normal weight. She is not ill-appearing, toxic-appearing or diaphoretic.  HENT:     Head: Normocephalic and atraumatic.     Nose: Nose normal. No congestion or rhinorrhea.     Mouth/Throat:     Mouth: Mucous membranes are moist.     Pharynx: Oropharynx is clear. No oropharyngeal exudate or posterior oropharyngeal erythema.  Eyes:     General: No scleral icterus.       Right eye: No discharge.        Left eye: No discharge.     Extraocular Movements: Extraocular movements intact.     Conjunctiva/sclera: Conjunctivae normal.  Pupils: Pupils are equal, round, and reactive to light.  Neck:     Vascular: No carotid bruit.  Cardiovascular:     Rate and Rhythm: Normal rate and regular rhythm.     Heart sounds: No murmur heard. No friction rub. No gallop.   Pulmonary:     Effort: Pulmonary effort is normal. No respiratory distress.     Breath sounds: Normal breath sounds. No stridor. No wheezing, rhonchi or rales.  Chest:     Chest wall: No mass, lacerations, deformity, swelling, tenderness, crepitus or edema. There is no dullness to percussion.  Breasts: Breasts are symmetrical.     Right: Normal. No swelling, bleeding, inverted nipple, mass,  nipple discharge, skin change, tenderness, axillary adenopathy or supraclavicular adenopathy.     Left: Absent. No mass, skin change, tenderness, axillary adenopathy or supraclavicular adenopathy.    Abdominal:     General: Abdomen is flat. Bowel sounds are normal. There is no distension.     Palpations: There is no mass.     Tenderness: There is no abdominal tenderness. There is no right CVA tenderness, left CVA tenderness, guarding or rebound.     Hernia: No hernia is present.  Musculoskeletal:        General: No swelling, tenderness, deformity or signs of injury. Normal range of motion.     Cervical back: Normal range of motion and neck supple. No rigidity or tenderness.     Right lower leg: No edema.     Left lower leg: No edema.  Lymphadenopathy:     Cervical: No cervical adenopathy.     Upper Body:     Right upper body: No supraclavicular, axillary or pectoral adenopathy.     Left upper body: No supraclavicular, axillary or pectoral adenopathy.  Skin:    General: Skin is warm and dry.     Capillary Refill: Capillary refill takes less than 2 seconds.     Coloration: Skin is not jaundiced or pale.     Findings: Rash present. No bruising, erythema or lesion.     Comments: 2 inch x 2 inch round area of blisters  Neurological:     General: No focal deficit present.     Mental Status: She is alert and oriented to person, place, and time. Mental status is at baseline.     Cranial Nerves: No cranial nerve deficit.     Sensory: No sensory deficit.     Motor: No weakness.     Coordination: Coordination normal.     Gait: Gait normal.     Deep Tendon Reflexes: Reflexes normal.  Psychiatric:        Mood and Affect: Mood normal.        Behavior: Behavior normal.        Thought Content: Thought content normal.        Judgment: Judgment normal.     LABS:   CBC Latest Ref Rng & Units 12/05/2020 11/07/2020 08/09/2020  WBC - 6.6 6.3 7.6  Hemoglobin 12.0 - 16.0 14.1 13.2 15.3   Hematocrit 36 - 46 43 40 44  Platelets 150 - 399 291 274 281   CMP Latest Ref Rng & Units 12/05/2020 11/07/2020 08/09/2020  Glucose 70 - 99 mg/dL - - -  BUN 4 - 21 13 25(A) 12  Creatinine 0.5 - 1.1 1.1 1.1 1.4(A)  Sodium 137 - 147 138 138 136(A)  Potassium 3.4 - 5.3 3.7 3.8 3.8  Chloride 99 - 108 102 106 102  CO2  13 - 22 27(A) 23(A) 26(A)  Calcium 8.7 - 10.7 9.3 8.7 9.5  Total Protein 6.0 - 8.3 g/dL - - -  Total Bilirubin 0.3 - 1.2 mg/dL - - -  Alkaline Phos 25 - 125 82 140(A) 84  AST 13 - 35 '20 19 20  ' ALT 7 - 35 '30 19 14    ' Lab Results  Component Value Date   LDH 185 10/20/2008     STUDIES:   Allergies:  Allergies  Allergen Reactions  . Effexor [Venlafaxine] Palpitations  . Nsaids Other (See Comments)    Pt reports kidney disease Other reaction(s): Other (See Comments) Pt reports kidney disease Pt reports kidney disease    Current Medications: Current Outpatient Medications  Medication Sig Dispense Refill  . montelukast (SINGULAIR) 10 MG tablet Take by mouth.    Marland Kitchen albuterol (VENTOLIN HFA) 108 (90 Base) MCG/ACT inhaler INHALE 2 PUFFS INTO THE LUNGS EVERY 4 TO 6 HOURS AS NEEDED FOR COUGH OR WHEEZING 18 g 1  . ARIPiprazole (ABILIFY) 2 MG tablet Take 1 tablet (2 mg total) by mouth daily. 30 tablet 2  . Azelastine HCl 0.15 % SOLN Can use one spray in each nostril two times daily if needed. 90 mL 1  . clorazepate (TRANXENE) 3.75 MG tablet Take one tablet up to four times daily as needed for anxiety/panic attacks. 120 tablet 2  . docusate sodium (COLACE) 50 MG capsule Take 50 mg by mouth 2 (two) times daily.    . famotidine (PEPCID) 40 MG tablet TAKE 1 TABLET BY MOUTH TWICE DAILY AS DIRECTED 60 tablet 5  . fluticasone (FLONASE) 50 MCG/ACT nasal spray     . folic acid (FOLVITE) 1 MG tablet TAKE 1 TABLET BY MOUTH DAILY 30 tablet 5  . ipratropium (ATROVENT) 0.06 % nasal spray Use 2 sprays in each nostril every 6 hours to dry up nose if needed 15 mL 5  . loratadine (CLARITIN)  10 MG tablet Take 10 mg by mouth daily.    . ondansetron (ZOFRAN) 4 MG tablet Take 4 mg by mouth every 4 (four) hours as needed.    . potassium chloride (KLOR-CON) 10 MEQ tablet TAKE 1 TABLET BY MOUTH TWICE DAILY 60 tablet 1  . prochlorperazine (COMPAZINE) 10 MG tablet TAKE 1 TABLET BY MOUTH EVERY 6 HOURS AS NEEDED FOR NAUSEA 90 tablet 1  . rosuvastatin (CRESTOR) 10 MG tablet Take 10 mg by mouth. Takes twice a week  1  . Semaglutide,0.25 or 0.5MG/DOS, (OZEMPIC, 0.25 OR 0.5 MG/DOSE,) 2 MG/1.5ML SOPN Inject 0.5 mg into the skin. Once weekly    . SYMBICORT 160-4.5 MCG/ACT inhaler INHALE 2 PUFFS BY MOUTH TWICE DAILY TO PREVENT COUGH OR WHEEZE. RINSE, GARGLE AND SPIT AFTER USE 10.2 g 2  . tamoxifen (NOLVADEX) 20 MG tablet Take 1 tablet (20 mg total) by mouth daily. 30 tablet 5  . topiramate (TOPAMAX) 50 MG tablet Take 1 tablet (50 mg total) by mouth daily. 90 tablet 3  . traZODone (DESYREL) 50 MG tablet Take one to two tablets at bedtime. 60 tablet 5  . UNABLE TO FIND Allergy injections for cats, grass, and dust.    . valsartan-hydrochlorothiazide (DIOVAN-HCT) 320-25 MG tablet Take 1 tablet by mouth daily.  0  . VIIBRYD 40 MG TABS Take 1 tablet (40 mg total) by mouth daily. 90 tablet 1  . vitamin B-12 (CYANOCOBALAMIN) 500 MCG tablet Take 500 mcg by mouth daily.     No current facility-administered medications for this visit.  ASSESSMENT & PLAN:   Assessment:   1. Clinical stage IIB (TXN2M0), grade 3, ductal carcinoma with no breast primary.  She received neoadjuvant chemotherapy with docetaxel/doxorubicin/cyclophosphamide completed in July.  She has now undergone left mastectomy and axillary dissection, with no breast primary found and 9/26 lymph nodes positive for metastasis. She completed adjuvant radiation at the end of November, and was placed on hormonal therapy with tamoxifen at the end of December.  When she undergoes bilateral salpingo oophorectomy, we could consider an aromatase  inhibitor.  Baseline bone density scan was completely normal. She continues tamoxifen without difficulties.  2. Large uterine fibroid.  She is planned for a hysterectomy/BSO this year.  She continues to have sporadic menstrual cycles.  3. Depression and anxiety, managed by Dr. Nyra Capes and her psychiatrist.  The tamoxifen can be lowered by most anti-depressant medications including the Wellbutrin. She has been placed on Celexa per her physician.   4.  Small hard nodule in the mid mastectomy incision, stable. This is unchanged.  5. Right hip pain.There is noted a rounded calcific density lateral and anterior to the distal right femur which corresponds in location to area of abnormal uptake seen on bone scan. Calcified soft tissue metastasis cannot be excluded. MRI was benign. She continues physical therapy.  6.Shingles. This is almost completely resolved  7. Neuropathy. We will continue to monitor. She does not wish to treat at this time.  Plan: She will continue with tamoxifen daily. She wants to undergo tattoo removal and I have provided her with a letter today stating she may do so. She knows to call us if she experiences any issues with infection.   She verbalizes understanding of and agreement to the plans discussed today. She knows to call the office should any new questions or concerns arise.    Melodye Ped, NP [facility] AT [facility] [address] [place] [address] Dept: [phone] Dept Fax: [phone]

## 2020-12-05 ENCOUNTER — Inpatient Hospital Stay: Payer: BC Managed Care – PPO

## 2020-12-05 ENCOUNTER — Other Ambulatory Visit: Payer: Self-pay

## 2020-12-05 ENCOUNTER — Inpatient Hospital Stay: Payer: BC Managed Care – PPO | Attending: Oncology | Admitting: Hematology and Oncology

## 2020-12-05 ENCOUNTER — Encounter: Payer: Self-pay | Admitting: Hematology and Oncology

## 2020-12-05 ENCOUNTER — Other Ambulatory Visit: Payer: Self-pay | Admitting: Hematology and Oncology

## 2020-12-05 VITALS — BP 139/89 | HR 97 | Temp 98.9°F | Resp 18 | Ht 65.0 in | Wt 248.4 lb

## 2020-12-05 DIAGNOSIS — M7071 Other bursitis of hip, right hip: Secondary | ICD-10-CM | POA: Diagnosis not present

## 2020-12-05 DIAGNOSIS — R262 Difficulty in walking, not elsewhere classified: Secondary | ICD-10-CM | POA: Diagnosis not present

## 2020-12-05 DIAGNOSIS — C50912 Malignant neoplasm of unspecified site of left female breast: Secondary | ICD-10-CM

## 2020-12-05 DIAGNOSIS — C773 Secondary and unspecified malignant neoplasm of axilla and upper limb lymph nodes: Secondary | ICD-10-CM | POA: Diagnosis not present

## 2020-12-05 DIAGNOSIS — M25551 Pain in right hip: Secondary | ICD-10-CM | POA: Diagnosis not present

## 2020-12-05 DIAGNOSIS — Z170 Estrogen receptor positive status [ER+]: Secondary | ICD-10-CM | POA: Diagnosis not present

## 2020-12-05 DIAGNOSIS — M545 Low back pain, unspecified: Secondary | ICD-10-CM | POA: Diagnosis not present

## 2020-12-05 LAB — HEPATIC FUNCTION PANEL
ALT: 30 (ref 7–35)
AST: 20 (ref 13–35)
Alkaline Phosphatase: 82 (ref 25–125)
Bilirubin, Total: 0.3

## 2020-12-05 LAB — BASIC METABOLIC PANEL
BUN: 13 (ref 4–21)
CO2: 27 — AB (ref 13–22)
Chloride: 102 (ref 99–108)
Creatinine: 1.1 (ref 0.5–1.1)
Glucose: 122
Potassium: 3.7 (ref 3.4–5.3)
Sodium: 138 (ref 137–147)

## 2020-12-05 LAB — CBC AND DIFFERENTIAL
HCT: 43 (ref 36–46)
Hemoglobin: 14.1 (ref 12.0–16.0)
Neutrophils Absolute: 4.75
Platelets: 291 (ref 150–399)
WBC: 6.6

## 2020-12-05 LAB — COMPREHENSIVE METABOLIC PANEL
Albumin: 4.3 (ref 3.5–5.0)
Calcium: 9.3 (ref 8.7–10.7)

## 2020-12-05 LAB — CBC: RBC: 4.85 (ref 3.87–5.11)

## 2020-12-06 ENCOUNTER — Telehealth (INDEPENDENT_AMBULATORY_CARE_PROVIDER_SITE_OTHER): Payer: 59 | Admitting: Adult Health

## 2020-12-06 DIAGNOSIS — F411 Generalized anxiety disorder: Secondary | ICD-10-CM | POA: Diagnosis not present

## 2020-12-06 DIAGNOSIS — C773 Secondary and unspecified malignant neoplasm of axilla and upper limb lymph nodes: Secondary | ICD-10-CM

## 2020-12-06 DIAGNOSIS — Z6841 Body Mass Index (BMI) 40.0 and over, adult: Secondary | ICD-10-CM | POA: Diagnosis not present

## 2020-12-06 DIAGNOSIS — F410 Panic disorder [episodic paroxysmal anxiety] without agoraphobia: Secondary | ICD-10-CM | POA: Diagnosis not present

## 2020-12-06 DIAGNOSIS — F39 Unspecified mood [affective] disorder: Secondary | ICD-10-CM

## 2020-12-06 DIAGNOSIS — F331 Major depressive disorder, recurrent, moderate: Secondary | ICD-10-CM | POA: Diagnosis not present

## 2020-12-06 MED ORDER — ARIPIPRAZOLE 2 MG PO TABS: 2.0000 mg | ORAL_TABLET | Freq: Every day | ORAL | 2 refills | Status: DC

## 2020-12-06 NOTE — Progress Notes (Signed)
Laurie Simmons [id number] March 30, 1966 55 y.o.  Virtual Visit via Telephone Note  I connected with pt on 12/06/20 at  9:40 AM EDT by telephone and verified that I am speaking with the correct person using two identifiers.   I discussed the limitations, risks, security and privacy concerns of performing an evaluation and management service by telephone and the availability of in person appointments. I also discussed with the patient that there may be a patient responsible charge related to this service. The patient expressed understanding and agreed to proceed.   I discussed the assessment and treatment plan with the patient. The patient was provided an opportunity to ask questions and all were answered. The patient agreed with the plan and demonstrated an understanding of the instructions.   The patient was advised to call back or seek an in-person evaluation if the symptoms worsen or if the condition fails to improve as anticipated.  I provided 30 minutes of non-face-to-face time during this encounter.  The patient was located at home.  The provider was located at [place].   Aloha Gell, NP   Subjective:   Patient ID:  Laurie Simmons is a 55 y.o. (DOB 04-04-1966) female.  Chief Complaint: No chief complaint on file.   HPI Laurie Simmons presents for follow-up of anxiety, panic attacks, depression, and mood disorder.  Describes mood today as "ok". Tearful at times. Pleasant. Mood symptoms - reports decreased depression, anxiety, and irritability. Stating "I am much better than I was before". Medications are working much better to "manage" mood. Recent MRI did not reveal any cancer recurrence.  Biopsy of tongue was normal. Recovering from shingles. Planning to see a therapist. Varying interest and motivation. Taking medications as prescribed. Energy levels varies. Active, does not have a regular exercise routine with current physical disabilities..  Enjoys some usual interests and  activities. Married. Lives with husband and their two children - son 10 and daughter 71. Mostly staying home. Appetite adequate. Weight gain - 240 to 245 pounds. Sleeps better some nights than others - waking up during the night. Averages 6 to 7 hours.  Focus and concentration difficulties. Completing tasks. Managing aspects of household. Currently disabled with cancer treatment. Denies SI or HI.  Denies AH or VH.   Review of Systems:  Review of Systems  Musculoskeletal: Negative for gait problem.  Neurological: Negative for tremors.  Psychiatric/Behavioral:       Please refer to HPI    Medications: I have reviewed the patient's current medications.  Current Outpatient Medications  Medication Sig Dispense Refill  . albuterol (VENTOLIN HFA) 108 (90 Base) MCG/ACT inhaler INHALE 2 PUFFS INTO THE LUNGS EVERY 4 TO 6 HOURS AS NEEDED FOR COUGH OR WHEEZING 18 g 1  . ARIPiprazole (ABILIFY) 2 MG tablet Take 1 tablet (2 mg total) by mouth daily. 30 tablet 2  . Azelastine HCl 0.15 % SOLN Can use one spray in each nostril two times daily if needed. 90 mL 1  . clorazepate (TRANXENE) 3.75 MG tablet Take one tablet up to four times daily as needed for anxiety/panic attacks. 120 tablet 2  . docusate sodium (COLACE) 50 MG capsule Take 50 mg by mouth 2 (two) times daily.    . famotidine (PEPCID) 40 MG tablet TAKE 1 TABLET BY MOUTH TWICE DAILY AS DIRECTED 60 tablet 5  . fluticasone (FLONASE) 50 MCG/ACT nasal spray     . folic acid (FOLVITE) 1 MG tablet TAKE 1 TABLET BY MOUTH DAILY 30  tablet 5  . ipratropium (ATROVENT) 0.06 % nasal spray Use 2 sprays in each nostril every 6 hours to dry up nose if needed 15 mL 5  . loratadine (CLARITIN) 10 MG tablet Take 10 mg by mouth daily.    . montelukast (SINGULAIR) 10 MG tablet Take by mouth.    . ondansetron (ZOFRAN) 4 MG tablet Take 4 mg by mouth every 4 (four) hours as needed.    . potassium chloride (KLOR-CON) 10 MEQ tablet TAKE 1 TABLET BY MOUTH TWICE DAILY 60  tablet 1  . prochlorperazine (COMPAZINE) 10 MG tablet TAKE 1 TABLET BY MOUTH EVERY 6 HOURS AS NEEDED FOR NAUSEA 90 tablet 1  . rosuvastatin (CRESTOR) 10 MG tablet Take 10 mg by mouth. Takes twice a week  1  . Semaglutide,0.25 or 0.5MG/DOS, (OZEMPIC, 0.25 OR 0.5 MG/DOSE,) 2 MG/1.5ML SOPN Inject 0.5 mg into the skin. Once weekly    . SYMBICORT 160-4.5 MCG/ACT inhaler INHALE 2 PUFFS BY MOUTH TWICE DAILY TO PREVENT COUGH OR WHEEZE. RINSE, GARGLE AND SPIT AFTER USE 10.2 g 2  . tamoxifen (NOLVADEX) 20 MG tablet Take 1 tablet (20 mg total) by mouth daily. 30 tablet 5  . topiramate (TOPAMAX) 50 MG tablet Take 1 tablet (50 mg total) by mouth daily. 90 tablet 3  . traZODone (DESYREL) 50 MG tablet Take one to two tablets at bedtime. 60 tablet 5  . UNABLE TO FIND Allergy injections for cats, grass, and dust.    . valsartan-hydrochlorothiazide (DIOVAN-HCT) 320-25 MG tablet Take 1 tablet by mouth daily.  0  . VIIBRYD 40 MG TABS Take 1 tablet (40 mg total) by mouth daily. 90 tablet 1  . vitamin B-12 (CYANOCOBALAMIN) 500 MCG tablet Take 500 mcg by mouth daily.     No current facility-administered medications for this visit.    Medication Side Effects: None  Allergies:  Allergies  Allergen Reactions  . Effexor [Venlafaxine] Palpitations  . Nsaids Other (See Comments)    Pt reports kidney disease Other reaction(s): Other (See Comments) Pt reports kidney disease Pt reports kidney disease    Past Medical History:  Diagnosis Date  . Anxiety   . Arthritis   . Asthma   . Breast cancer ([place]) 2021  . Carpal tunnel syndrome   . Carpal tunnel syndrome    bilateral  . Chronic kidney disease    stage 3  . Depression   . Diabetes ([place])   . GERD (gastroesophageal reflux disease)   . Hypertension   . Irritable bowel syndrome   . Severe anemia [id number]    Family History  Problem Relation Age of Onset  . Pulmonary fibrosis Father   . Cancer Maternal Grandmother        carcinoma of the vulva and  possible ovarian cancer  . Prostate cancer Maternal Grandfather   . Lung cancer Paternal Grandfather        heavy smoker    Social History   Socioeconomic History  . Marital status: Married    Spouse name: Abbe Amsterdam  . Number of children: 2  . Years of education: Not on file  . Highest education level: Not on file  Occupational History  . Not on file  Tobacco Use  . Smoking status: Never Smoker  . Smokeless tobacco: Never Used  Vaping Use  . Vaping Use: Never used  Substance and Sexual Activity  . Alcohol use: No  . Drug use: No  . Sexual activity: Yes  Other Topics Concern  . Not  on file  Social History Narrative  . Not on file   Social Determinants of Health   Financial Resource Strain: Not on file  Food Insecurity: Not on file  Transportation Needs: Not on file  Physical Activity: Not on file  Stress: Not on file  Social Connections: Not on file  Intimate Partner Violence: Not on file    Past Medical History, Surgical history, Social history, and Family history were reviewed and updated as appropriate.   Please see review of systems for further details on the patient's review from today.   Objective:   Physical Exam:  There were no vitals taken for this visit.  Physical Exam Constitutional:      General: She is not in acute distress. Musculoskeletal:        General: No deformity.  Neurological:     Mental Status: She is alert and oriented to person, place, and time.     Coordination: Coordination normal.  Psychiatric:        Attention and Perception: Attention and perception normal. She does not perceive auditory or visual hallucinations.        Mood and Affect: Mood normal. Mood is not anxious or depressed. Affect is not labile, blunt, angry or inappropriate.        Speech: Speech normal.        Behavior: Behavior normal.        Thought Content: Thought content normal. Thought content is not paranoid or delusional. Thought content does not include  homicidal or suicidal ideation. Thought content does not include homicidal or suicidal plan.        Cognition and Memory: Cognition and memory normal.        Judgment: Judgment normal.     Comments: Insight intact     Lab Review:     Component Value Date/Time   NA 138 12/05/2020 0000   K 3.7 12/05/2020 0000   CL 102 12/05/2020 0000   CO2 27 (A) 12/05/2020 0000   GLUCOSE 97 10/21/2008 0545   BUN 13 12/05/2020 0000   CREATININE 1.1 12/05/2020 0000   CREATININE 0.76 10/20/2008 2105   CALCIUM 9.3 12/05/2020 0000   PROT 6.0 10/20/2008 2105   ALBUMIN 4.3 12/05/2020 0000   AST 20 12/05/2020 0000   ALT 30 12/05/2020 0000   ALKPHOS 82 12/05/2020 0000   BILITOT 0.2 (L) 10/20/2008 2105   GFRNONAA >60 10/20/2008 2105   GFRAA  10/20/2008 2105    >60        The eGFR has been calculated using the MDRD equation. This calculation has not been validated in all clinical situations. eGFR's persistently <60 mL/min signify possible Chronic Kidney Disease.       Component Value Date/Time   WBC 6.6 12/05/2020 0000   WBC 12.3 (H) 10/22/2008 0530   RBC 4.85 12/05/2020 0000   HGB 14.1 12/05/2020 0000   HGB 11.6 12/23/2018 1146   HCT 43 12/05/2020 0000   HCT 36.4 12/23/2018 1146   PLT 291 12/05/2020 0000   MCV 87 11/07/2020 0000   MCH 22.3 (L) 12/23/2018 1146   MCHC 31.9 12/23/2018 1146   MCHC 33.7 10/22/2008 0530   RDW 16.9 (H) 12/23/2018 1146   LYMPHSABS 2.8 12/23/2018 1146   EOSABS 0.2 12/23/2018 1146   BASOSABS 0.1 12/23/2018 1146    No results found for: POCLITH, LITHIUM   No results found for: PHENYTOIN, PHENOBARB, VALPROATE, CBMZ   .res Assessment: Plan:    Plan:  1. Viibryd 56m daily  2. Tranxene 3.69m up to 4 times daily 3. Topamax 50 mg daily 4. Abilify 237mdaily 5. Trazadone 5085m 1 to 2 tablets  RTC 4 weeks  Patient advised to contact office with any questions, adverse effects, or acute worsening in signs and symptoms.  Discussed potential benefits,  risk, and side effects of benzodiazepines to include potential risk of tolerance and dependence, as well as possible drowsiness. Advised patient not to drive if experiencing drowsiness and to take lowest possible effective dose to minimize risk of dependence and tolerance.    Diagnoses and all orders for this visit:  Major depressive disorder, recurrent episode, moderate (HCC)  Panic attacks  Generalized anxiety disorder  Episodic mood disorder (HCC) -     ARIPiprazole (ABILIFY) 2 MG tablet; Take 1 tablet (2 mg total) by mouth daily.  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  Future Appointments  Date Time Provider [place]/13/2022 10:00 AM Kozlow, EriDonnamarie PoagD [facility] None  01/02/2021  9:30 AM [facility] None  01/02/2021 10:00 AM ParDayton Scrape NP [facility] None    No orders of the defined types were placed in this encounter.     -------------------------------

## 2020-12-07 DIAGNOSIS — M7071 Other bursitis of hip, right hip: Secondary | ICD-10-CM | POA: Diagnosis not present

## 2020-12-07 DIAGNOSIS — R262 Difficulty in walking, not elsewhere classified: Secondary | ICD-10-CM | POA: Diagnosis not present

## 2020-12-07 DIAGNOSIS — M25551 Pain in right hip: Secondary | ICD-10-CM | POA: Diagnosis not present

## 2020-12-07 DIAGNOSIS — M545 Low back pain, unspecified: Secondary | ICD-10-CM | POA: Diagnosis not present

## 2020-12-09 IMAGING — MR MR BREAST BILAT WO/W CM
8 of 12 series · 33 of 48 positions shown · IV contrast (gadavist)
Comparison: Previous exam(s).

CLINICAL DATA: 54-year-old female with poorly differentiated
carcinoma involving the left axillary lymph node. No MRI evidence of
primary breast malignancy on workup. Patient presents for
re-evaluation after chemotherapy.

LABS:  None performed on site.
EXAM:
BILATERAL BREAST MRI WITH AND WITHOUT CONTRAST
TECHNIQUE: Multiplanar, multisequence MR images of both breasts were obtained
prior to and following the intravenous administration of 10 ml of
Gadavist.

[Series 2: t2_tirm_tra ipat (a-p) · axial · 3.0mm · 0.70mm/px · 1 of 64 slices shown]
[im 1/64]
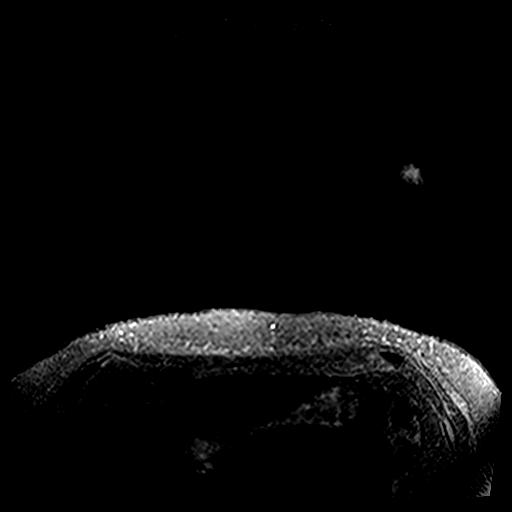

[Series 3: fl3d pre-cm no · axial · non-contrast · 1.2mm · 0.94mm/px · z∈[-97,+94]mm · 5 of 160 slices shown]
[im 1/160]
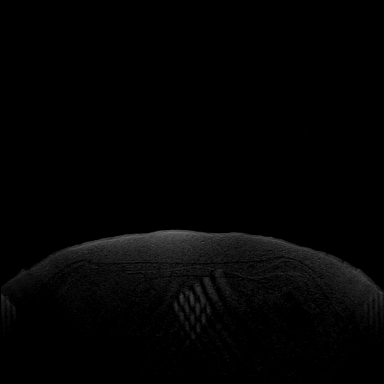
[im 40/160]
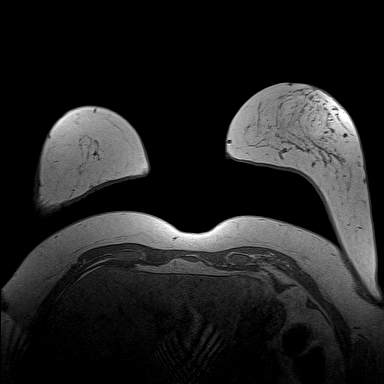
[im 80/160]
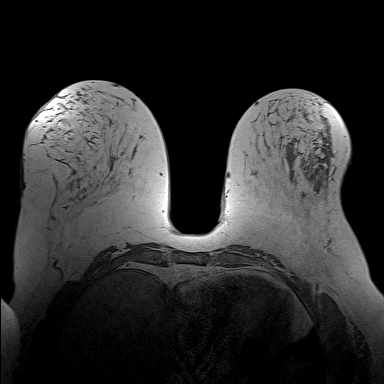
[im 120/160]
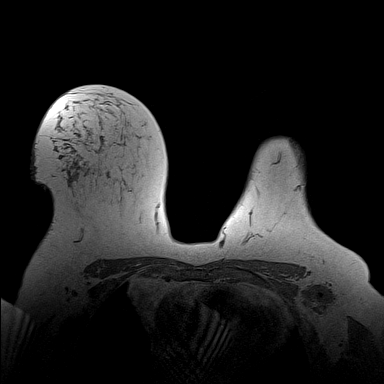
[im 160/160]
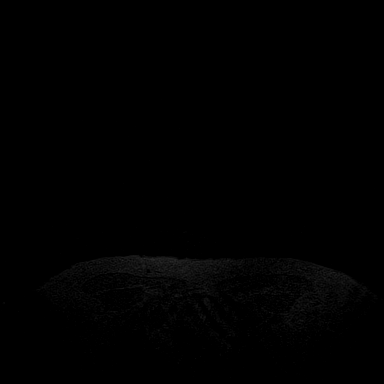

[Series 4: fl3d pre-cm · axial · non-contrast · 1.2mm · 0.94mm/px · z∈[-97,+94]mm · 5 of 160 slices shown]
[im 1/160]
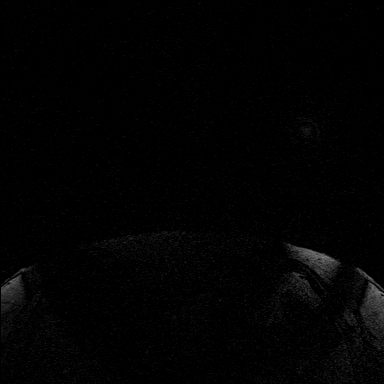
[im 40/160]
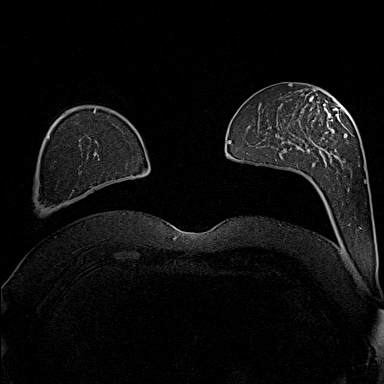
[im 80/160]
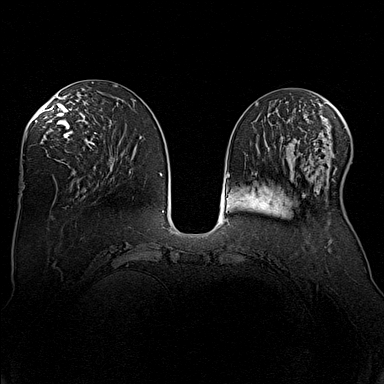
[im 120/160]
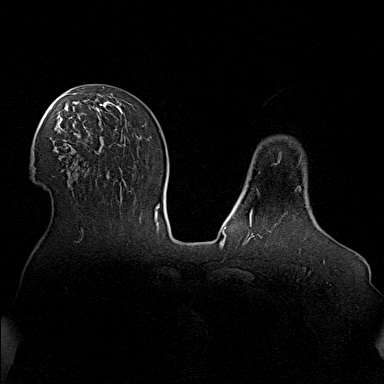
[im 160/160]
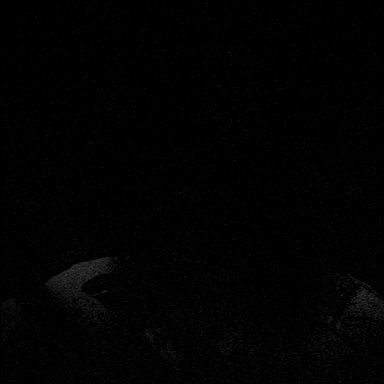

[Series 5: fl3d post-cm 20 · axial · 1.2mm · 0.94mm/px · z∈[-97,+94]mm · 5 of 160 slices shown (1 of 3)]
[im 1/160]
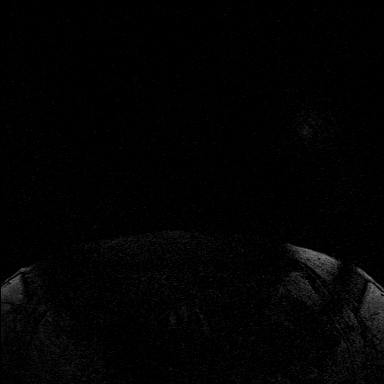
[im 40/160]
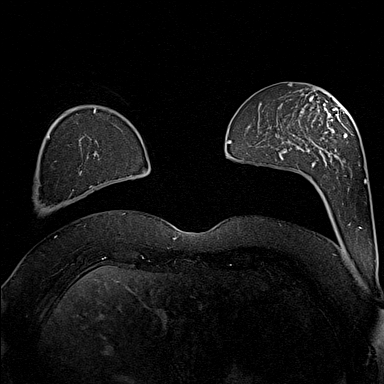
[im 80/160]
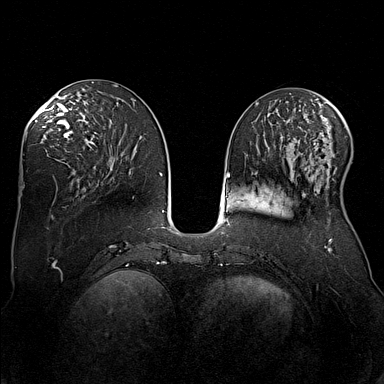
[im 120/160]
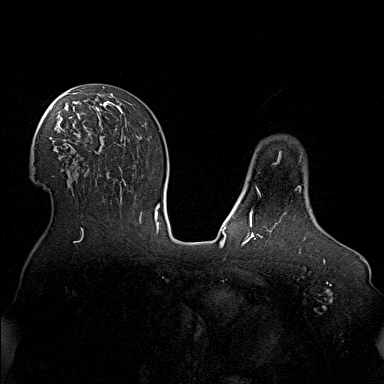
[im 160/160]
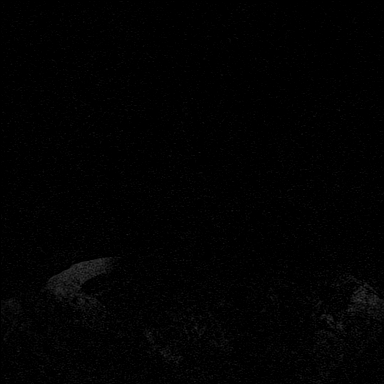

[Series 6: fl3d post-cm 20 · axial · 1.2mm · 0.94mm/px · z∈[-97,+94]mm · 5 of 160 slices shown (2 of 3)]
[im 1/160]
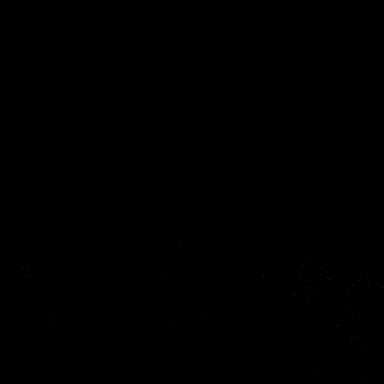
[im 40/160]
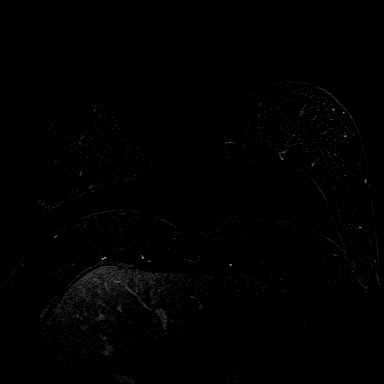
[im 80/160]
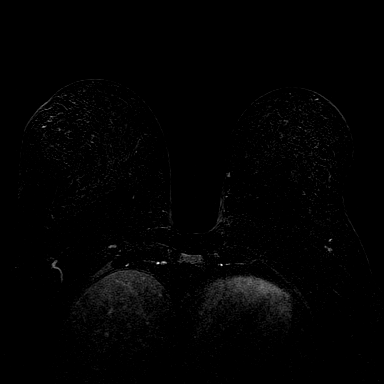
[im 120/160]
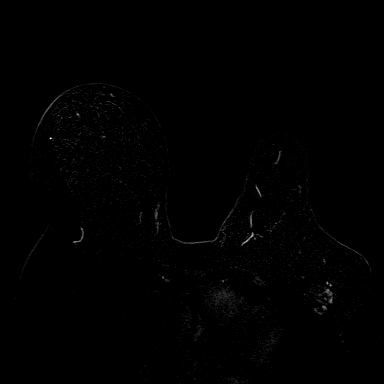
[im 160/160]
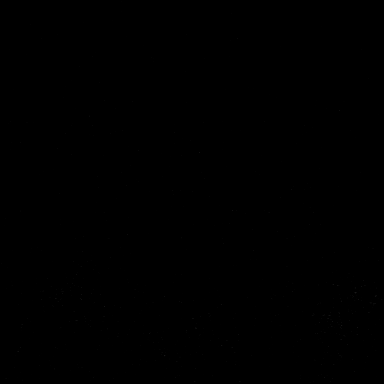

[Series 7: fl3d post-cm 20 · axial · 192.0mm · 0.94mm/px · 1 of 1 slices shown (3 of 3)]
[im 1/1]
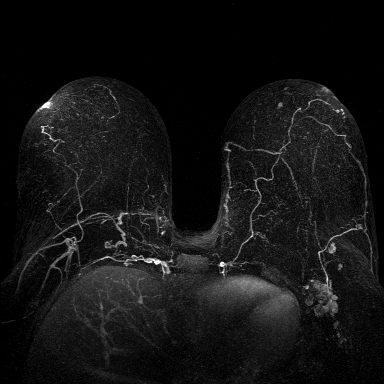

[Series 8: fl3d post-cm 3min · axial · 1.2mm · 0.94mm/px · z∈[-97,+94]mm · 6 of 160 slices shown]
[im 1/160]
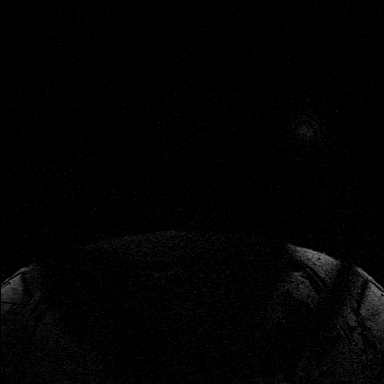
[im 32/160]
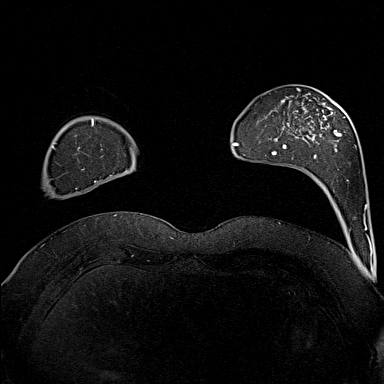
[im 64/160]
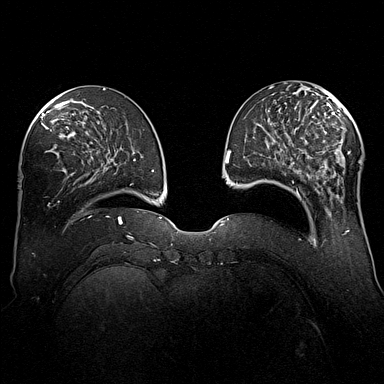
[im 96/160]
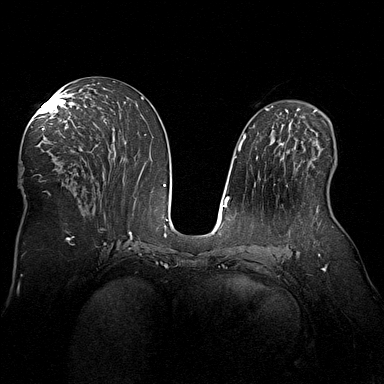
[im 128/160]
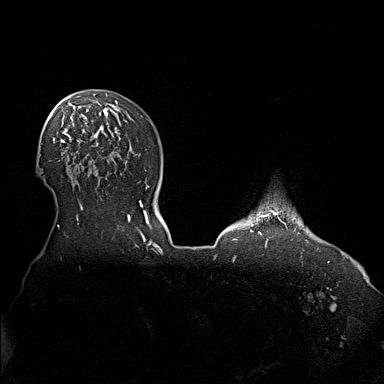
[im 160/160]
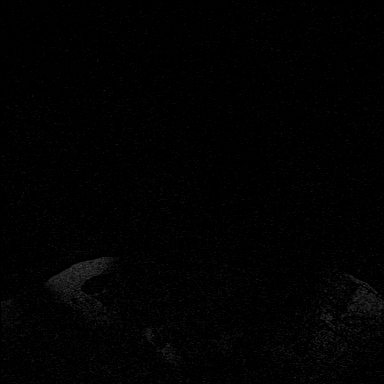

[Series 9: fl3d post-cm 3min_sub · axial · 1.2mm · 0.94mm/px · z∈[-97,+56]mm · 5 of 160 slices shown]
[im 1/160]
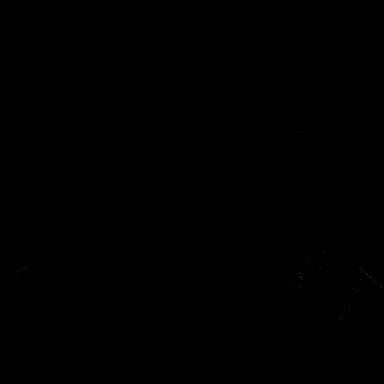
[im 32/160]
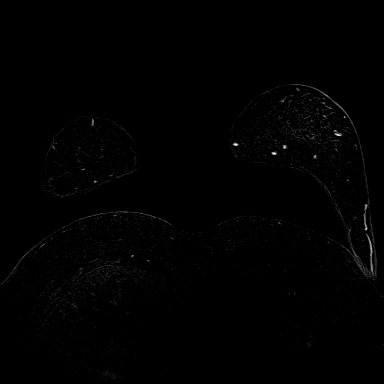
[im 64/160]
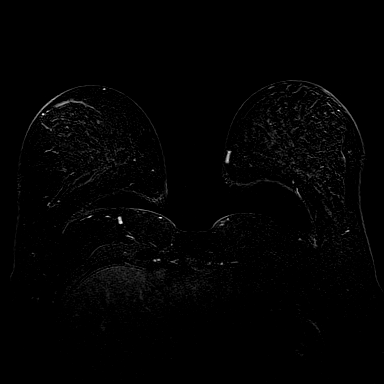
[im 96/160]
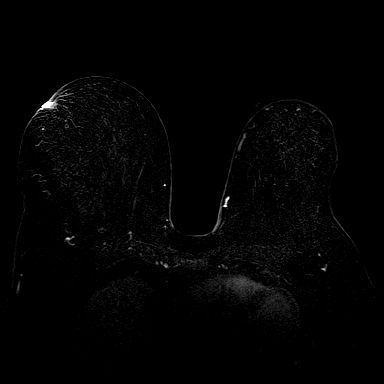
[im 128/160]
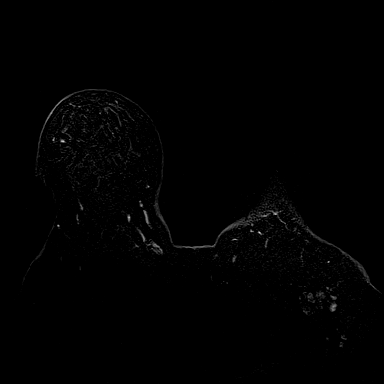

[33 of 48 positions shown; findings below may reference images not displayed]

Three-dimensional MR images were rendered by post-processing of the
original MR data on an independent workstation. The
three-dimensional MR images were interpreted, and findings are
reported in the following complete MRI report for this study. Three
dimensional images were evaluated at the independent interpreting
workstation using the DynaCAD thin client.
FINDINGS: Breast composition: c. Heterogeneous fibroglandular tissue.

Background parenchymal enhancement: Mild to moderate.

Right breast: No suspicious mass or abnormal enhancement.

Left breast: No suspicious mass or abnormal enhancement.

Lymph nodes: There is been interval decrease in size and prominence
of a multiple level 1 left axillary lymph nodes. The largest lymph
node today measures 3.0 x 1.7 cm (previously 6 x 3 cm). This lymph
node demonstrates artifact from post biopsy clip. No suspicious
right axillary or internal mammary chain lymphadenopathy.

Ancillary findings:  None.
IMPRESSION: Interval decrease in size of left axillary lymphadenopathy
consistent with response to chemotherapy. The largest lymph node
today measures 3 x 1.7 cm (previously 6 x 3 cm).

RECOMMENDATION:
Per clinical treatment plan.

BI-RADS CATEGORY  6: Known biopsy-proven malignancy.

## 2020-12-12 DIAGNOSIS — R262 Difficulty in walking, not elsewhere classified: Secondary | ICD-10-CM | POA: Diagnosis not present

## 2020-12-12 DIAGNOSIS — M7071 Other bursitis of hip, right hip: Secondary | ICD-10-CM | POA: Diagnosis not present

## 2020-12-12 DIAGNOSIS — M25551 Pain in right hip: Secondary | ICD-10-CM | POA: Diagnosis not present

## 2020-12-12 DIAGNOSIS — M545 Low back pain, unspecified: Secondary | ICD-10-CM | POA: Diagnosis not present

## 2020-12-13 ENCOUNTER — Ambulatory Visit (INDEPENDENT_AMBULATORY_CARE_PROVIDER_SITE_OTHER): Payer: BC Managed Care – PPO | Admitting: *Deleted

## 2020-12-13 DIAGNOSIS — M7071 Other bursitis of hip, right hip: Secondary | ICD-10-CM | POA: Diagnosis not present

## 2020-12-13 DIAGNOSIS — Z6841 Body Mass Index (BMI) 40.0 and over, adult: Secondary | ICD-10-CM | POA: Diagnosis not present

## 2020-12-13 DIAGNOSIS — R262 Difficulty in walking, not elsewhere classified: Secondary | ICD-10-CM | POA: Diagnosis not present

## 2020-12-13 DIAGNOSIS — J309 Allergic rhinitis, unspecified: Secondary | ICD-10-CM | POA: Diagnosis not present

## 2020-12-13 DIAGNOSIS — M25551 Pain in right hip: Secondary | ICD-10-CM | POA: Diagnosis not present

## 2020-12-13 DIAGNOSIS — M545 Low back pain, unspecified: Secondary | ICD-10-CM | POA: Diagnosis not present

## 2020-12-18 DIAGNOSIS — M7071 Other bursitis of hip, right hip: Secondary | ICD-10-CM | POA: Diagnosis not present

## 2020-12-18 DIAGNOSIS — R262 Difficulty in walking, not elsewhere classified: Secondary | ICD-10-CM | POA: Diagnosis not present

## 2020-12-18 DIAGNOSIS — M25551 Pain in right hip: Secondary | ICD-10-CM | POA: Diagnosis not present

## 2020-12-18 DIAGNOSIS — M545 Low back pain, unspecified: Secondary | ICD-10-CM | POA: Diagnosis not present

## 2020-12-20 DIAGNOSIS — M545 Low back pain, unspecified: Secondary | ICD-10-CM | POA: Diagnosis not present

## 2020-12-20 DIAGNOSIS — M25551 Pain in right hip: Secondary | ICD-10-CM | POA: Diagnosis not present

## 2020-12-20 DIAGNOSIS — M7071 Other bursitis of hip, right hip: Secondary | ICD-10-CM | POA: Diagnosis not present

## 2020-12-20 DIAGNOSIS — R262 Difficulty in walking, not elsewhere classified: Secondary | ICD-10-CM | POA: Diagnosis not present

## 2020-12-21 ENCOUNTER — Other Ambulatory Visit: Payer: Self-pay | Admitting: Oncology

## 2020-12-21 DIAGNOSIS — C773 Secondary and unspecified malignant neoplasm of axilla and upper limb lymph nodes: Secondary | ICD-10-CM

## 2020-12-25 ENCOUNTER — Ambulatory Visit (INDEPENDENT_AMBULATORY_CARE_PROVIDER_SITE_OTHER): Payer: BC Managed Care – PPO | Admitting: Allergy and Immunology

## 2020-12-25 ENCOUNTER — Encounter: Payer: Self-pay | Admitting: Allergy and Immunology

## 2020-12-25 ENCOUNTER — Other Ambulatory Visit: Payer: Self-pay

## 2020-12-25 VITALS — BP 116/78 | HR 88 | Temp 98.4°F | Resp 16 | Ht 65.0 in | Wt 248.0 lb

## 2020-12-25 DIAGNOSIS — J4540 Moderate persistent asthma, uncomplicated: Secondary | ICD-10-CM | POA: Diagnosis not present

## 2020-12-25 DIAGNOSIS — K219 Gastro-esophageal reflux disease without esophagitis: Secondary | ICD-10-CM | POA: Diagnosis not present

## 2020-12-25 DIAGNOSIS — J3089 Other allergic rhinitis: Secondary | ICD-10-CM | POA: Diagnosis not present

## 2020-12-25 DIAGNOSIS — J309 Allergic rhinitis, unspecified: Secondary | ICD-10-CM

## 2020-12-25 MED ORDER — LORATADINE 10 MG PO TABS: 10.0000 mg | ORAL_TABLET | Freq: Every day | ORAL | 3 refills | Status: DC

## 2020-12-25 NOTE — Progress Notes (Signed)
[facility] [place] [facility] [place]   Follow-up Note  Referring Provider: Maryella Shivers, MD Primary Provider: Marco Collie, MD Date of Office Visit: 12/25/2020  Subjective:   Laurie Simmons (DOB: [birth year]) is a 55 y.o. female who returns to the [facility] on 12/25/2020 in re-evaluation of the following:  HPI: Laurie Simmons returns to this clinic in evaluation of asthma and allergic rhinoconjunctivitis and reflux.  Her last visit to this clinic was 26 June 2020.  Overall she is doing very well with her asthma and has not had any significant issues requiring her to use a systemic steroid to treat an exacerbation and rare use of a short acting bronchodilator while she utilizes Symbicort mostly 1 time per day.  Likewise her upper airway is really been doing quite well and she has not required an antibiotic to treat an episode of sinusitis while she uses Flonase a few times a week.  Her reflux appears to be under very good control using famotidine mostly 1 time per day but occasionally twice a day.  When she was last seen in this clinic she had some history consistent with esophageal dysmotility but that has since resolved.  Her immunotherapy is going quite well currently at every 2 weeks at this point in time.  She has had [place] vaccines and has had a COVID infection in December 2020.  Allergies as of 12/25/2020       Reactions   Effexor [venlafaxine] Palpitations   Nsaids Other (See Comments)   Pt reports kidney disease Other reaction(s): Other (See Comments) Pt reports kidney disease Pt reports kidney disease        Medication List      albuterol 108 (90 Base) MCG/ACT inhaler Commonly known as: VENTOLIN HFA INHALE 2 PUFFS INTO THE LUNGS EVERY 4 TO 6 HOURS AS NEEDED FOR COUGH OR WHEEZING   ARIPiprazole 2 MG tablet Commonly known as: Abilify Take 1 tablet (2 mg total) by mouth daily.   Azelastine HCl 0.15 % Soln Can use one  spray in each nostril two times daily if needed.   clorazepate 3.75 MG tablet Commonly known as: TRANXENE Take one tablet up to four times daily as needed for anxiety/panic attacks.   docusate sodium 50 MG capsule Commonly known as: COLACE Take 50 mg by mouth 2 (two) times daily.   famotidine 40 MG tablet Commonly known as: PEPCID TAKE 1 TABLET BY MOUTH TWICE DAILY AS DIRECTED   fluticasone 50 MCG/ACT nasal spray Commonly known as: FLONASE   folic acid 1 MG tablet Commonly known as: FOLVITE TAKE 1 TABLET BY MOUTH DAILY   ipratropium 0.06 % nasal spray Commonly known as: ATROVENT Use 2 sprays in each nostril every 6 hours to dry up nose if needed   loratadine 10 MG tablet Commonly known as: CLARITIN Take 10 mg by mouth daily.   montelukast 10 MG tablet Commonly known as: SINGULAIR Take by mouth.   ondansetron 4 MG tablet Commonly known as: ZOFRAN Take 4 mg by mouth every 4 (four) hours as needed.   Ozempic (0.25 or 0.5 MG/DOSE) 2 MG/1.5ML Sopn Generic drug: Semaglutide(0.25 or 0.5MG /DOS) Inject 0.5 mg into the skin. Once weekly   potassium chloride 10 MEQ tablet Commonly known as: KLOR-CON TAKE 1 TABLET BY MOUTH TWICE DAILY   prochlorperazine 10 MG tablet Commonly known as: COMPAZINE TAKE 1 TABLET BY MOUTH EVERY 6 HOURS AS NEEDED FOR NAUSEA   rosuvastatin 10 MG  tablet Commonly known as: CRESTOR Take 10 mg by mouth. Takes twice a week   Symbicort 160-4.5 MCG/ACT inhaler Generic drug: budesonide-formoterol INHALE 2 PUFFS BY MOUTH TWICE DAILY TO PREVENT COUGH OR WHEEZE. RINSE, GARGLE AND SPIT AFTER USE   tamoxifen 20 MG tablet Commonly known as: NOLVADEX TAKE 1 TABLET(20 MG) BY MOUTH DAILY   topiramate 50 MG tablet Commonly known as: TOPAMAX Take 1 tablet (50 mg total) by mouth daily.   traZODone 50 MG tablet Commonly known as: DESYREL Take one to two tablets at bedtime.   UNABLE TO FIND Allergy injections for cats, grass, and dust.    valsartan-hydrochlorothiazide 320-25 MG tablet Commonly known as: DIOVAN-HCT Take 1 tablet by mouth daily.   Viibryd 40 MG Tabs Generic drug: Vilazodone HCl Take 1 tablet (40 mg total) by mouth daily.   vitamin B-12 500 MCG tablet Commonly known as: CYANOCOBALAMIN Take 500 mcg by mouth daily.        Past Medical History:  Diagnosis Date   Anxiety    Arthritis    Asthma    Breast cancer ([place]) 2021   Carpal tunnel syndrome    Carpal tunnel syndrome    bilateral   Chronic kidney disease    stage 3   Depression    Diabetes (HCC)    GERD (gastroesophageal reflux disease)    Hypertension    Irritable bowel syndrome    Severe anemia [id number]    Past Surgical History:  Procedure Laterality Date   caesarean     CHOLECYSTECTOMY     DILATION AND CURETTAGE OF UTERUS     HIP SURGERY Left    PORT A CATH INJECTION ([place])  2021    Review of systems negative except as noted in HPI / PMHx or noted below:  Review of Systems  Constitutional: Negative.   HENT: Negative.    Eyes: Negative.   Respiratory: Negative.    Cardiovascular: Negative.   Gastrointestinal: Negative.   Genitourinary: Negative.   Musculoskeletal: Negative.   Skin: Negative.   Neurological: Negative.   Endo/Heme/Allergies: Negative.   Psychiatric/Behavioral: Negative.      Objective:   Vitals:   12/25/20 1003  BP: 116/78  Pulse: 88  Resp: 16  Temp: 98.4 F (36.9 C)  SpO2: 98%   Height: 5\' 5"  (165.1 cm)  Weight: 248 lb (112.5 kg)   Physical Exam Constitutional:      Appearance: She is not diaphoretic.  HENT:     Head: Normocephalic.     Right Ear: Tympanic membrane, ear canal and external ear normal.     Left Ear: Tympanic membrane, ear canal and external ear normal.     Nose: Nose normal. No mucosal edema or rhinorrhea.     Mouth/Throat:     Pharynx: Uvula midline. No oropharyngeal exudate.  Eyes:     Conjunctiva/sclera: Conjunctivae normal.  Neck:     Thyroid: No thyromegaly.      Trachea: Trachea normal. No tracheal tenderness or tracheal deviation.  Cardiovascular:     Rate and Rhythm: Normal rate and regular rhythm.     Heart sounds: Normal heart sounds, S1 normal and S2 normal. No murmur heard. Pulmonary:     Effort: No respiratory distress.     Breath sounds: Normal breath sounds. No stridor. No wheezing or rales.  Lymphadenopathy:     Head:     Right side of head: No tonsillar adenopathy.     Left side of head: No tonsillar adenopathy.  Cervical: No cervical adenopathy.  Skin:    Findings: No erythema or rash.     Nails: There is no clubbing.  Neurological:     Mental Status: She is alert.    Diagnostics:    Spirometry was performed and demonstrated an FEV1 of 2.21 at 79 % of predicted.  Results of a barium swallow obtained 11 July 2020 was unremarkable without any identifiable defect.  Assessment and Plan:   1. Allergic rhinitis, unspecified seasonality, unspecified trigger   2. Asthma, moderate persistent, well-controlled   3. Other allergic rhinitis   4. LPRD (laryngopharyngeal reflux disease)      1. Continue Symbicort 160 2 inhalations 1-2 times per day    2. Continue Flonase - 1 spray each nostril 3-7 times per week  3. Continue famotidine 40 mg - 1 tablet 1-2 times per day  4. Continue immunotherapy and EpiPen  5. If needed:   A. Ventolin  B. OTC antihistamime  C. Nasal saline  D. Azelastine 1 spray each nostril 2 times per da  7. Return to clinic in 6 months or earlier if problem   Kolette appears to be doing quite well on her current plan which includes anti-inflammatory agents for her airway including immunotherapy and therapy directed against reflux.  She has a very good understanding about her disease state and appropriate use of her medications depending on the activity of her disease state.  Assuming she does well with the plan noted above I will see her back in this clinic in 6 months or earlier if there is a  problem.  Allena Katz, MD Allergy / Immunology [place]

## 2020-12-25 NOTE — Patient Instructions (Addendum)
  1. Continue Symbicort 160 2 inhalations 1-2 times per day    2. Continue Flonase - 1 spray each nostril 3-7 times per week  3. Continue famotidine 40 mg - 1 tablet 1-2 times per day  4. Continue immunotherapy and EpiPen  5. If needed:   A. Ventolin  B. OTC antihistamime  C. Nasal saline  D. Azelastine 1 spray each nostril 2 times per da  7. Return to clinic in 6 months or earlier if problem

## 2020-12-26 ENCOUNTER — Encounter: Payer: Self-pay | Admitting: Allergy and Immunology

## 2020-12-26 DIAGNOSIS — R262 Difficulty in walking, not elsewhere classified: Secondary | ICD-10-CM | POA: Diagnosis not present

## 2020-12-26 DIAGNOSIS — M545 Low back pain, unspecified: Secondary | ICD-10-CM | POA: Diagnosis not present

## 2020-12-26 DIAGNOSIS — M25551 Pain in right hip: Secondary | ICD-10-CM | POA: Diagnosis not present

## 2020-12-26 DIAGNOSIS — M7071 Other bursitis of hip, right hip: Secondary | ICD-10-CM | POA: Diagnosis not present

## 2020-12-27 DIAGNOSIS — M7071 Other bursitis of hip, right hip: Secondary | ICD-10-CM | POA: Diagnosis not present

## 2020-12-27 DIAGNOSIS — R262 Difficulty in walking, not elsewhere classified: Secondary | ICD-10-CM | POA: Diagnosis not present

## 2020-12-27 DIAGNOSIS — Z6841 Body Mass Index (BMI) 40.0 and over, adult: Secondary | ICD-10-CM | POA: Diagnosis not present

## 2020-12-27 DIAGNOSIS — M545 Low back pain, unspecified: Secondary | ICD-10-CM | POA: Diagnosis not present

## 2020-12-27 DIAGNOSIS — M25551 Pain in right hip: Secondary | ICD-10-CM | POA: Diagnosis not present

## 2021-01-01 ENCOUNTER — Other Ambulatory Visit: Payer: Self-pay | Admitting: Hematology and Oncology

## 2021-01-01 DIAGNOSIS — R110 Nausea: Secondary | ICD-10-CM

## 2021-01-01 NOTE — Progress Notes (Signed)
Laurie Simmons  [address] [place],  [place]  [address] [phone]  Clinic Day:  01/13/2021  Referring physician: Marco Collie, MD     CHIEF COMPLAINT:  CC: A 55 year old female with history of clinical stage IIB hormone receptor positive left breast cancer here to review imaging and 3 month evaluation  Current Treatment:  Tamoxifen 20 mg daily starting at the end of December  HISTORY OF PRESENT ILLNESS:  Laurie Simmons is a 55 y.o. female with clinical stage IIB (TX N2 M0) hormone receptor positive left breast cancer with left axillary adenopathy, but no obvious breast primary diagnosed in February.  She developed left axillary lymphadenopathy in December 2020 which did not improve with antibiotics.  Bilateral diagnostic mammogram and left breast ultrasound in January revealed 4 enlarged lymph nodes in the left axilla with the largest measuring 5.5 cm.  Ultrasound guided biopsy in February revealed poorly differentiated carcinoma.  Immunophenotype is consistent with metastatic carcinoma and with GATA-3 positivity, which is most consistent with metastatic breast carcinoma.  Estrogen receptors were positive at 95% and progesterone receptors were positive at 50.  HER2 was negative.  Ki67 was 20%.  Bilateral breast MRI in March did not reveal any evidence of primary malignancy.  It also revealed left axillary lymphadenopathy with 6 enlarged lymph nodes, the largest measuring 3 x 6 cm.  She has been seen by Dr. Noberto Retort to discuss mastectomy and lymph node dissection versus breast conservation.  She had been scheduled for a hysterectomy due to a large uterine fibroid measuring 9.8 cm, as well as uterine enlargement with Dr. Marvel Plan around the time of her diagnosis, so this has now been put on hold.  She had been on Provera 10 mg daily.  Provera was subsequently increased to 20 mg daily due to persistent heavy vaginal bleeding.  MRI brain did not reveal any metastatic  disease.  PET scan revealed enlarged hypermetabolic left subpectoral and axillary adenopathy, with an index node measuring 3.3 cm in short axis with maximum SUV 6.7.  No other hypermetabolic activity was observed.  Echocardiogram revealed ejection fraction between 60 and 65%.   She is receiving neoadjuvant chemotherapy with docetaxel/doxorubicin/cyclophosphamide (TAC) and had her 1st cycle on March 30th and has tolerated this fairly well.  She had worsening depression, being managed by her psychiatrist and Dr. Nyra Capes.  She reported insomnia so Dr. Nyra Capes had placed her on olanzapine 5 mg at bedtime.  The patient felt that this was too sedating, so we recommended she discuss this with Dr. Nyra Capes.  We had decreased her Provera to 10 mg daily, as her vaginal bleeding had improved.  She had a repeat echocardiogram on June 17th, which revealed normal left ventricular size and function with an ejection fraction of 60-65%, which was stable.  She completed 6 cycles of TAC chemotherapy in mid July.  CT chest, abdomen and pelvis in August did not reveal any evidence of metastatic disease.  Repeat MRI breast in August did not reveal any disease within the breast.  The left axillary lymph nodes had decreased by about 50%.  She underwent left mastectomy and axillary dissection on September 1st with Dr. Noberto Retort.  The final pathology did not reveal any malignancy within the breast.  9/26 lymph nodes were positive for metastasis, the largest measuring 15 mm.  She is premenopausal.  She was referred for post mastectomy radiation and completed this on November 23rd.  She was taking NSAID's for her back pain  but I had her stop that last month due to a rise in the creatinine to 1.6.  Her baseline creatinine has been mildly elevated at 1.3.  Bone density scan from September was normal.  At the end of December we had her start tamoxifen.    INTERVAL HISTORY:  Laurie Simmons is here for 4 week evaluation. She has been well since last visit.  She continues with tamoxifen daily without difficulty. She continues to have anxiety which has been a little worse over the last couple of weeks. She blames this on her busy schedule for herself and her children.She also stopped her trazadone about a week ago and states this may contribute to some of her anxiety; however, she does not want to restart the medicine at this time. She denies fever, chills, nausea or vomiting. She denies shortness of breath, chest pain or cough. She denies issue with bowel or bladder. She continues to have pain in her right hip, but this is improving with physical therapy and yoga. CBC and CMP are unremarkable.    REVIEW OF SYSTEMS:  Review of Systems  Constitutional:  Negative for appetite change, chills, diaphoresis, fatigue, fever and unexpected weight change.  HENT:   Negative for hearing loss, lump/mass, mouth sores, nosebleeds, sore throat, tinnitus, trouble swallowing and voice change.   Eyes:  Negative for eye problems and icterus.  Respiratory:  Negative for chest tightness, cough, hemoptysis, shortness of breath and wheezing.   Cardiovascular:  Negative for chest pain, leg swelling and palpitations.  Gastrointestinal:  Negative for abdominal distention, abdominal pain, blood in stool, constipation, diarrhea, nausea, rectal pain and vomiting.  Endocrine: Positive for hot flashes.  Genitourinary:  Negative for bladder incontinence, difficulty urinating, dyspareunia, dysuria, frequency, hematuria and nocturia.   Musculoskeletal:  Negative for arthralgias, back pain, flank pain, gait problem, myalgias, neck pain and neck stiffness.       Right hip pain  Skin:  Positive for rash. Negative for itching and wound.  Neurological:  Positive for numbness. Negative for dizziness, extremity weakness, gait problem, headaches, light-headedness, seizures and speech difficulty.  Hematological:  Negative for adenopathy. Does not bruise/bleed easily.  Psychiatric/Behavioral:   Positive for depression. Negative for confusion, decreased concentration, sleep disturbance and suicidal ideas. The patient is not nervous/anxious.     VITALS:  Blood pressure (!) 165/91, pulse 85, temperature 99 F (37.2 C), temperature source Oral, resp. rate 18, height 5' 5" (1.651 m), weight 245 lb 11.2 oz (111.4 kg), SpO2 98 %.  Wt Readings from Last 3 Encounters:  01/02/21 245 lb 11.2 oz (111.4 kg)  12/25/20 248 lb (112.5 kg)  12/05/20 248 lb 6.4 oz (112.7 kg)    Body mass index is 40.89 kg/m.  Performance status (ECOG): 1 - Symptomatic but completely ambulatory  PHYSICAL EXAM:  Physical Exam Constitutional:      General: She is not in acute distress.    Appearance: Normal appearance. She is normal weight. She is not ill-appearing, toxic-appearing or diaphoretic.  HENT:     Head: Normocephalic and atraumatic.     Nose: Nose normal. No congestion or rhinorrhea.     Mouth/Throat:     Mouth: Mucous membranes are moist.     Pharynx: Oropharynx is clear. No oropharyngeal exudate or posterior oropharyngeal erythema.  Eyes:     General: No scleral icterus.       Right eye: No discharge.        Left eye: No discharge.     Extraocular Movements: Extraocular  movements intact.     Conjunctiva/sclera: Conjunctivae normal.     Pupils: Pupils are equal, round, and reactive to light.  Neck:     Vascular: No carotid bruit.  Cardiovascular:     Rate and Rhythm: Normal rate and regular rhythm.     Heart sounds: No murmur heard.   No friction rub. No gallop.  Pulmonary:     Effort: Pulmonary effort is normal. No respiratory distress.     Breath sounds: Normal breath sounds. No stridor. No wheezing, rhonchi or rales.  Chest:     Chest wall: No mass, lacerations, deformity, swelling, tenderness, crepitus or edema. There is no dullness to percussion.  Breasts:    Breasts are symmetrical.     Right: Normal. No swelling, bleeding, inverted nipple, mass, nipple discharge, skin change,  tenderness, axillary adenopathy or supraclavicular adenopathy.     Left: Absent. No mass, skin change, tenderness, axillary adenopathy or supraclavicular adenopathy.  Abdominal:     General: Abdomen is flat. Bowel sounds are normal. There is no distension.     Palpations: There is no mass.     Tenderness: There is no abdominal tenderness. There is no right CVA tenderness, left CVA tenderness, guarding or rebound.     Hernia: No hernia is present.  Musculoskeletal:        General: No swelling, tenderness, deformity or signs of injury. Normal range of motion.     Cervical back: Normal range of motion and neck supple. No rigidity or tenderness.     Right lower leg: No edema.     Left lower leg: No edema.  Lymphadenopathy:     Cervical: No cervical adenopathy.     Upper Body:     Right upper body: No supraclavicular, axillary or pectoral adenopathy.     Left upper body: No supraclavicular, axillary or pectoral adenopathy.  Skin:    General: Skin is warm and dry.     Capillary Refill: Capillary refill takes less than 2 seconds.     Coloration: Skin is not jaundiced or pale.     Findings: No bruising, erythema or lesion.  Neurological:     General: No focal deficit present.     Mental Status: She is alert and oriented to person, place, and time. Mental status is at baseline.     Cranial Nerves: No cranial nerve deficit.     Sensory: No sensory deficit.     Motor: No weakness.     Coordination: Coordination normal.     Gait: Gait normal.     Deep Tendon Reflexes: Reflexes normal.  Psychiatric:        Mood and Affect: Mood normal.        Behavior: Behavior normal.        Thought Content: Thought content normal.        Judgment: Judgment normal.    LABS:   CBC Latest Ref Rng & Units 01/02/2021 12/05/2020 11/07/2020  WBC - 8.5 6.6 6.3  Hemoglobin 12.0 - 16.0 14.0 14.1 13.2  Hematocrit 36 - 46 42 43 40  Platelets 150 - 399 289 291 274   CMP Latest Ref Rng & Units 01/02/2021 12/05/2020  11/07/2020  Glucose 70 - 99 mg/dL - - -  BUN 4 - _0 25(A)  Creatinine 0.5 - 1.1 1.1 1.1 1.1  Sodium 137 - 147 139 138 138  Potassium 3.4 - 5.3 3.5 3.7 3.8  Chloride 99 - 108 103 102 106  CO2 13 -  22 27(A) 27(A) 23(A)  Calcium 8.7 - 10.7 9.3 9.3 8.7  Total Protein 6.0 - 8.3 g/dL - - -  Total Bilirubin 0.3 - 1.2 mg/dL - - -  Alkaline Phos 25 - 125 76 82 140(A)  AST 13 - 35 _0 ALT 7 - 35 _1 Lab Results  Component Value Date   LDH 185 10/20/2008     STUDIES:   Allergies:  Allergies  Allergen Reactions   Effexor [Venlafaxine] Palpitations   Nsaids Other (See Comments)    Pt reports kidney disease Other reaction(s): Other (See Comments) Pt reports kidney disease Pt reports kidney disease    Current Medications: Current Outpatient Medications  Medication Sig Dispense Refill   albuterol (VENTOLIN HFA) 108 (90 Base) MCG/ACT inhaler INHALE 2 PUFFS INTO THE LUNGS EVERY 4 TO 6 HOURS AS NEEDED FOR COUGH OR WHEEZING 18 g 1   ARIPiprazole (ABILIFY) 2 MG tablet Take 1 tablet (2 mg total) by mouth daily. 90 tablet 1   Azelastine HCl 0.15 % SOLN Can use one spray in each nostril two times daily if needed. 90 mL 1   clorazepate (TRANXENE) 3.75 MG tablet Take one tablet up to four times daily as needed for anxiety/panic attacks. 120 tablet 2   docusate sodium (COLACE) 50 MG capsule Take 50 mg by mouth 2 (two) times daily.     famotidine (PEPCID) 40 MG tablet TAKE 1 TABLET BY MOUTH TWICE DAILY AS DIRECTED 60 tablet 5   fluticasone (FLONASE) 50 MCG/ACT nasal spray      folic acid (FOLVITE) 1 MG tablet TAKE 1 TABLET BY MOUTH DAILY 30 tablet 5   ipratropium (ATROVENT) 0.06 % nasal spray Use 2 sprays in each nostril every 6 hours to dry up nose if needed (Patient not taking: Reported on 12/25/2020) 15 mL 5   loratadine (CLARITIN) 10 MG tablet Take 1 tablet (10 mg total) by mouth daily. 90 tablet 3   montelukast (SINGULAIR) 10 MG tablet Take by mouth.     ondansetron  (ZOFRAN) 4 MG tablet TAKE 1 TABLET BY MOUTH EVERY 4 HOURS AS NEEDED FOR NAUSEA 30 tablet 0   potassium chloride (KLOR-CON) 10 MEQ tablet TAKE 1 TABLET BY MOUTH TWICE DAILY 60 tablet 1   prochlorperazine (COMPAZINE) 10 MG tablet TAKE 1 TABLET BY MOUTH EVERY 6 HOURS AS NEEDED FOR NAUSEA 90 tablet 1   rosuvastatin (CRESTOR) 10 MG tablet Take 10 mg by mouth. Takes twice a week  1   Semaglutide,0.25 or 0.5MG/DOS, (OZEMPIC, 0.25 OR 0.5 MG/DOSE,) 2 MG/1.5ML SOPN Inject 0.5 mg into the skin. Once weekly     SYMBICORT 160-4.5 MCG/ACT inhaler INHALE 2 PUFFS BY MOUTH TWICE DAILY TO PREVENT COUGH OR WHEEZE. RINSE, GARGLE AND SPIT AFTER USE 10.2 g 2   tamoxifen (NOLVADEX) 20 MG tablet TAKE 1 TABLET(20 MG) BY MOUTH DAILY 90 tablet 3   topiramate (TOPAMAX) 50 MG tablet Take 1 tablet (50 mg total) by mouth daily. 90 tablet 3   UNABLE TO FIND Allergy injections for cats, grass, and dust.     valsartan-hydrochlorothiazide (DIOVAN-HCT) 320-25 MG tablet Take 1 tablet by mouth daily.  0   VIIBRYD 40 MG TABS Take 1 tablet (40 mg total) by mouth daily. 90 tablet 1   vitamin B-12 (CYANOCOBALAMIN) 500 MCG tablet Take 500 mcg by mouth daily.     No current facility-administered medications for this visit.     ASSESSMENT & PLAN:   Assessment:  1. Clinical stage IIB (TXN2M0), grade 3, ductal carcinoma with no breast primary.  She received neoadjuvant chemotherapy with docetaxel/doxorubicin/cyclophosphamide completed in July.  She has now undergone left mastectomy and axillary dissection, with no breast primary found and 9/26 lymph nodes positive for metastasis. She completed adjuvant radiation at the end of November, and was placed on hormonal therapy with tamoxifen at the end of December.  When she undergoes bilateral salpingo oophorectomy, we could consider an aromatase inhibitor.  Baseline bone density scan was completely normal. She continues tamoxifen without difficulties.  2. Large uterine fibroid.  She is planned  for a hysterectomy/BSO this year.  She continues to have sporadic menstrual cycles.  3. Depression and anxiety, managed by Dr. Nyra Capes and her psychiatrist. She has been placed on Celexa per her physician.   4.  Small hard nodule in the mid mastectomy incision, stable. This is unchanged.  5. Right hip pain.There is noted a rounded calcific density lateral and anterior to the distal right femur which corresponds in location to area of abnormal uptake seen on bone scan. Calcified soft tissue metastasis cannot be excluded. MRI was benign. She continues physical therapy.  7. Neuropathy. We will continue to monitor. She does not wish to treat at this time.  Plan: She will continue with tamoxifen daily.We will see her back in clinic in 2 months for repeat evaluation.   She verbalizes understanding of and agreement to the plans discussed today. She knows to call the office should any new questions or concerns arise.    Melodye Ped, NP [facility] [address] [place] [address] Dept: [phone] Dept Fax: [phone]

## 2021-01-02 ENCOUNTER — Inpatient Hospital Stay: Payer: BC Managed Care – PPO | Attending: Oncology | Admitting: Hematology and Oncology

## 2021-01-02 ENCOUNTER — Encounter: Payer: Self-pay | Admitting: Hematology and Oncology

## 2021-01-02 ENCOUNTER — Other Ambulatory Visit: Payer: Self-pay

## 2021-01-02 ENCOUNTER — Telehealth: Payer: Self-pay | Admitting: Hematology and Oncology

## 2021-01-02 ENCOUNTER — Inpatient Hospital Stay: Payer: BC Managed Care – PPO

## 2021-01-02 VITALS — BP 165/91 | HR 85 | Temp 99.0°F | Resp 18 | Ht 65.0 in | Wt 245.7 lb

## 2021-01-02 DIAGNOSIS — Z170 Estrogen receptor positive status [ER+]: Secondary | ICD-10-CM

## 2021-01-02 DIAGNOSIS — C50912 Malignant neoplasm of unspecified site of left female breast: Secondary | ICD-10-CM

## 2021-01-02 DIAGNOSIS — C773 Secondary and unspecified malignant neoplasm of axilla and upper limb lymph nodes: Secondary | ICD-10-CM | POA: Diagnosis not present

## 2021-01-02 LAB — CBC AND DIFFERENTIAL
HCT: 42 (ref 36–46)
Hemoglobin: 14 (ref 12.0–16.0)
Neutrophils Absolute: 6.29
Platelets: 289 (ref 150–399)
WBC: 8.5

## 2021-01-02 LAB — HEPATIC FUNCTION PANEL
ALT: 15 (ref 7–35)
AST: 20 (ref 13–35)
Alkaline Phosphatase: 76 (ref 25–125)
Bilirubin, Total: 0.4

## 2021-01-02 LAB — BASIC METABOLIC PANEL
BUN: 14 (ref 4–21)
CO2: 27 — AB (ref 13–22)
Chloride: 103 (ref 99–108)
Creatinine: 1.1 (ref 0.5–1.1)
Glucose: 109
Potassium: 3.5 (ref 3.4–5.3)
Sodium: 139 (ref 137–147)

## 2021-01-02 LAB — CBC: RBC: 4.78 (ref 3.87–5.11)

## 2021-01-02 LAB — COMPREHENSIVE METABOLIC PANEL
Albumin: 4.3 (ref 3.5–5.0)
Calcium: 9.3 (ref 8.7–10.7)

## 2021-01-02 NOTE — Telephone Encounter (Signed)
Per 6/21 los next appt scheduled and given to patient 

## 2021-01-04 ENCOUNTER — Ambulatory Visit: Payer: 59 | Admitting: Adult Health

## 2021-01-08 ENCOUNTER — Ambulatory Visit (INDEPENDENT_AMBULATORY_CARE_PROVIDER_SITE_OTHER): Payer: 59 | Admitting: Adult Health

## 2021-01-08 ENCOUNTER — Encounter: Payer: Self-pay | Admitting: Adult Health

## 2021-01-08 ENCOUNTER — Other Ambulatory Visit: Payer: Self-pay

## 2021-01-08 DIAGNOSIS — F39 Unspecified mood [affective] disorder: Secondary | ICD-10-CM | POA: Diagnosis not present

## 2021-01-08 DIAGNOSIS — F331 Major depressive disorder, recurrent, moderate: Secondary | ICD-10-CM | POA: Diagnosis not present

## 2021-01-08 DIAGNOSIS — F410 Panic disorder [episodic paroxysmal anxiety] without agoraphobia: Secondary | ICD-10-CM

## 2021-01-08 DIAGNOSIS — F411 Generalized anxiety disorder: Secondary | ICD-10-CM | POA: Diagnosis not present

## 2021-01-08 MED ORDER — ARIPIPRAZOLE 2 MG PO TABS: 2.0000 mg | ORAL_TABLET | Freq: Every day | ORAL | 1 refills | Status: DC

## 2021-01-08 MED ORDER — CLORAZEPATE DIPOTASSIUM 3.75 MG PO TABS: ORAL_TABLET | ORAL | 2 refills | Status: DC

## 2021-01-08 MED ORDER — VIIBRYD 40 MG PO TABS: 40.0000 mg | ORAL_TABLET | Freq: Every day | ORAL | 1 refills | Status: DC

## 2021-01-08 NOTE — Progress Notes (Signed)
Laurie Simmons [id number] 02/11/1966 55 y.o.  Subjective:   Patient ID:  Laurie Simmons is a 55 y.o. (DOB 1966-07-15) female.  Chief Complaint: No chief complaint on file.   HPI Laurie Simmons presents to the office today for follow-up of anxiety, panic attacks, depression, and mood disorder.  Describes mood today as "ok". Pleasant. Tearful at times. Mood symptoms - reports decreased depression, anxiety, and irritability. Stating "I'm doing pretty good right now". Feels like things are much "calmer". Has random emotional "spots". Reports some "mood swings". Gets really "emotional" at times. Feels like current medications are working wel for her. Stable interest and motivation. Taking medications as prescribed. Energy levels varies. Active, does not have a regular exercise routine with current physical disabilities. Is working with P/T. Enjoys some usual interests and activities. Married. Lives with husband and their two children - son 28 and daughter 62 - home for the summer. Mostly staying home. Appetite adequate. Weight gain - 240 to 245 pounds. Working with weight loss management. Sleeps better some nights than others - waking up during the night. Averages 7 hours.  Focus and concentration difficulties. Completing tasks. Managing some aspects of household. Currently disabled with cancer treatment. Denies SI or HI.  Denies AH or VH.   Review of Systems:  Review of Systems  Musculoskeletal:  Negative for gait problem.  Neurological:  Negative for tremors.  Psychiatric/Behavioral:         Please refer to HPI   Medications: I have reviewed the patient's current medications.  Current Outpatient Medications  Medication Sig Dispense Refill  . albuterol (VENTOLIN HFA) 108 (90 Base) MCG/ACT inhaler INHALE 2 PUFFS INTO THE LUNGS EVERY 4 TO 6 HOURS AS NEEDED FOR COUGH OR WHEEZING 18 g 1  . ARIPiprazole (ABILIFY) 2 MG tablet Take 1 tablet (2 mg total) by mouth daily. 90 tablet 1  . Azelastine HCl 0.15 %  SOLN Can use one spray in each nostril two times daily if needed. 90 mL 1  . clorazepate (TRANXENE) 3.75 MG tablet Take one tablet up to four times daily as needed for anxiety/panic attacks. 120 tablet 2  . docusate sodium (COLACE) 50 MG capsule Take 50 mg by mouth 2 (two) times daily.    . famotidine (PEPCID) 40 MG tablet TAKE 1 TABLET BY MOUTH TWICE DAILY AS DIRECTED 60 tablet 5  . fluticasone (FLONASE) 50 MCG/ACT nasal spray     . folic acid (FOLVITE) 1 MG tablet TAKE 1 TABLET BY MOUTH DAILY 30 tablet 5  . ipratropium (ATROVENT) 0.06 % nasal spray Use 2 sprays in each nostril every 6 hours to dry up nose if needed (Patient not taking: Reported on 12/25/2020) 15 mL 5  . loratadine (CLARITIN) 10 MG tablet Take 1 tablet (10 mg total) by mouth daily. 90 tablet 3  . montelukast (SINGULAIR) 10 MG tablet Take by mouth.    . ondansetron (ZOFRAN) 4 MG tablet TAKE 1 TABLET BY MOUTH EVERY 4 HOURS AS NEEDED FOR NAUSEA 30 tablet 0  . potassium chloride (KLOR-CON) 10 MEQ tablet TAKE 1 TABLET BY MOUTH TWICE DAILY 60 tablet 1  . prochlorperazine (COMPAZINE) 10 MG tablet TAKE 1 TABLET BY MOUTH EVERY 6 HOURS AS NEEDED FOR NAUSEA 90 tablet 1  . rosuvastatin (CRESTOR) 10 MG tablet Take 10 mg by mouth. Takes twice a week  1  . Semaglutide,0.25 or 0.5MG/DOS, (OZEMPIC, 0.25 OR 0.5 MG/DOSE,) 2 MG/1.5ML SOPN Inject 0.5 mg into the skin. Once weekly    .  SYMBICORT 160-4.5 MCG/ACT inhaler INHALE 2 PUFFS BY MOUTH TWICE DAILY TO PREVENT COUGH OR WHEEZE. RINSE, GARGLE AND SPIT AFTER USE 10.2 g 2  . tamoxifen (NOLVADEX) 20 MG tablet TAKE 1 TABLET(20 MG) BY MOUTH DAILY 90 tablet 3  . topiramate (TOPAMAX) 50 MG tablet Take 1 tablet (50 mg total) by mouth daily. 90 tablet 3  . UNABLE TO FIND Allergy injections for cats, grass, and dust.    . valsartan-hydrochlorothiazide (DIOVAN-HCT) 320-25 MG tablet Take 1 tablet by mouth daily.  0  . VIIBRYD 40 MG TABS Take 1 tablet (40 mg total) by mouth daily. 90 tablet 1  . vitamin B-12  (CYANOCOBALAMIN) 500 MCG tablet Take 500 mcg by mouth daily.     No current facility-administered medications for this visit.    Medication Side Effects: None  Allergies:  Allergies  Allergen Reactions  . Effexor [Venlafaxine] Palpitations  . Nsaids Other (See Comments)    Pt reports kidney disease Other reaction(s): Other (See Comments) Pt reports kidney disease Pt reports kidney disease    Past Medical History:  Diagnosis Date  . Anxiety   . Arthritis   . Asthma   . Breast cancer ([place]) 2021  . Carpal tunnel syndrome   . Carpal tunnel syndrome    bilateral  . Chronic kidney disease    stage 3  . Depression   . Diabetes ([place])   . GERD (gastroesophageal reflux disease)   . Hypertension   . Irritable bowel syndrome   . Severe anemia [id number]    Past Medical History, Surgical history, Social history, and Family history were reviewed and updated as appropriate.   Please see review of systems for further details on the patient's review from today.   Objective:   Physical Exam:  There were no vitals taken for this visit.  Physical Exam Constitutional:      General: She is not in acute distress. Musculoskeletal:        General: No deformity.  Neurological:     Mental Status: She is alert and oriented to person, place, and time.     Coordination: Coordination normal.  Psychiatric:        Attention and Perception: Attention and perception normal. She does not perceive auditory or visual hallucinations.        Mood and Affect: Mood normal. Mood is not anxious or depressed. Affect is not labile, blunt, angry or inappropriate.        Speech: Speech normal.        Behavior: Behavior normal.        Thought Content: Thought content normal. Thought content is not paranoid or delusional. Thought content does not include homicidal or suicidal ideation. Thought content does not include homicidal or suicidal plan.        Cognition and Memory: Cognition and memory normal.         Judgment: Judgment normal.     Comments: Insight intact    Lab Review:     Component Value Date/Time   NA 139 01/02/2021 0000   K 3.5 01/02/2021 0000   CL 103 01/02/2021 0000   CO2 27 (A) 01/02/2021 0000   GLUCOSE 97 10/21/2008 0545   BUN 14 01/02/2021 0000   CREATININE 1.1 01/02/2021 0000   CREATININE 0.76 10/20/2008 2105   CALCIUM 9.3 01/02/2021 0000   PROT 6.0 10/20/2008 2105   ALBUMIN 4.3 01/02/2021 0000   AST 20 01/02/2021 0000   ALT 15 01/02/2021 0000   ALKPHOS 76 01/02/2021  0000   BILITOT 0.2 (L) 10/20/2008 2105   GFRNONAA >60 10/20/2008 2105   GFRAA  10/20/2008 2105    >60        The eGFR has been calculated using the MDRD equation. This calculation has not been validated in all clinical situations. eGFR's persistently <60 mL/min signify possible Chronic Kidney Disease.       Component Value Date/Time   WBC 8.5 01/02/2021 0000   WBC 12.3 (H) 10/22/2008 0530   RBC 4.78 01/02/2021 0000   HGB 14.0 01/02/2021 0000   HGB 11.6 12/23/2018 1146   HCT 42 01/02/2021 0000   HCT 36.4 12/23/2018 1146   PLT 289 01/02/2021 0000   MCV 87 11/07/2020 0000   MCH 22.3 (L) 12/23/2018 1146   MCHC 31.9 12/23/2018 1146   MCHC 33.7 10/22/2008 0530   RDW 16.9 (H) 12/23/2018 1146   LYMPHSABS 2.8 12/23/2018 1146   EOSABS 0.2 12/23/2018 1146   BASOSABS 0.1 12/23/2018 1146    No results found for: POCLITH, LITHIUM   No results found for: PHENYTOIN, PHENOBARB, VALPROATE, CBMZ   .res Assessment: Plan:     Plan:  1. Viibryd 69m daily 2. Tranxene 3.728mup to 4 times daily 3. Topamax 50 mg daily 4. Abilify 36m35maily 5. Trazadone 31m61m1 to 2 tablets - no longer taking  RTC 4 weeks  Patient advised to contact office with any questions, adverse effects, or acute worsening in signs and symptoms.  Discussed potential benefits, risk, and side effects of benzodiazepines to include potential risk of tolerance and dependence, as well as possible drowsiness. Advised patient  not to drive if experiencing drowsiness and to take lowest possible effective dose to minimize risk of dependence and tolerance.   Diagnoses and all orders for this visit:  Episodic mood disorder (HCC) -     ARIPiprazole (ABILIFY) 2 MG tablet; Take 1 tablet (2 mg total) by mouth daily.  Major depressive disorder, recurrent episode, moderate (HCC) -     VIIBRYD 40 MG TABS; Take 1 tablet (40 mg total) by mouth daily.  Generalized anxiety disorder -     VIIBRYD 40 MG TABS; Take 1 tablet (40 mg total) by mouth daily. -     clorazepate (TRANXENE) 3.75 MG tablet; Take one tablet up to four times daily as needed for anxiety/panic attacks.  Panic attacks -     clorazepate (TRANXENE) 3.75 MG tablet; Take one tablet up to four times daily as needed for anxiety/panic attacks.    Please see After Visit Summary for patient specific instructions.  Future Appointments  Date Time Provider [place]16/2022 10:00 AM [facility] None  02/27/2021 10:30 AM McCaDerwood Kaplan [facility] None  06/27/2021  8:30 AM Kozlow, EricDonnamarie Poag [facility] None    No orders of the defined types were placed in this encounter.   -------------------------------

## 2021-01-10 ENCOUNTER — Telehealth: Payer: Self-pay | Admitting: Allergy and Immunology

## 2021-01-10 NOTE — Telephone Encounter (Signed)
Patient states her Symbicort prescription has increased to $70. She is wondering if there is a cheaper alternative that could be sent in to her instead.   Best Pharmacy: [facility] in [place]

## 2021-01-16 NOTE — Telephone Encounter (Signed)
Left detailed message asking patient if she has access to her drug formulary and if she could see if there is something else cheaper listed on the formulary.  I asked her to call us back.

## 2021-01-17 ENCOUNTER — Ambulatory Visit (INDEPENDENT_AMBULATORY_CARE_PROVIDER_SITE_OTHER): Payer: BC Managed Care – PPO | Admitting: *Deleted

## 2021-01-17 DIAGNOSIS — J309 Allergic rhinitis, unspecified: Secondary | ICD-10-CM | POA: Diagnosis not present

## 2021-01-17 DIAGNOSIS — M7071 Other bursitis of hip, right hip: Secondary | ICD-10-CM | POA: Diagnosis not present

## 2021-01-17 DIAGNOSIS — R262 Difficulty in walking, not elsewhere classified: Secondary | ICD-10-CM | POA: Diagnosis not present

## 2021-01-17 DIAGNOSIS — M25551 Pain in right hip: Secondary | ICD-10-CM | POA: Diagnosis not present

## 2021-01-17 DIAGNOSIS — M545 Low back pain, unspecified: Secondary | ICD-10-CM | POA: Diagnosis not present

## 2021-01-18 DIAGNOSIS — R262 Difficulty in walking, not elsewhere classified: Secondary | ICD-10-CM | POA: Diagnosis not present

## 2021-01-18 DIAGNOSIS — R531 Weakness: Secondary | ICD-10-CM | POA: Diagnosis not present

## 2021-01-18 DIAGNOSIS — M545 Low back pain, unspecified: Secondary | ICD-10-CM | POA: Diagnosis not present

## 2021-01-18 DIAGNOSIS — M25551 Pain in right hip: Secondary | ICD-10-CM | POA: Diagnosis not present

## 2021-01-18 DIAGNOSIS — M7071 Other bursitis of hip, right hip: Secondary | ICD-10-CM | POA: Diagnosis not present

## 2021-01-30 ENCOUNTER — Ambulatory Visit (INDEPENDENT_AMBULATORY_CARE_PROVIDER_SITE_OTHER): Payer: BC Managed Care – PPO

## 2021-01-30 ENCOUNTER — Other Ambulatory Visit: Payer: Self-pay | Admitting: *Deleted

## 2021-01-30 DIAGNOSIS — J309 Allergic rhinitis, unspecified: Secondary | ICD-10-CM

## 2021-01-30 MED ORDER — FLUTICASONE-SALMETEROL 250-50 MCG/ACT IN AEPB: INHALATION_SPRAY | RESPIRATORY_TRACT | 5 refills | Status: DC

## 2021-01-30 NOTE — Telephone Encounter (Signed)
Patient called her insurance and Advair Diskus is preferred.  Please advise.

## 2021-01-30 NOTE — Telephone Encounter (Signed)
Please use Advair 250 Diskus 1 inhalation twice a day to replace Symbicort

## 2021-01-30 NOTE — Telephone Encounter (Signed)
RX sent and patient informed.

## 2021-02-01 ENCOUNTER — Other Ambulatory Visit: Payer: Self-pay | Admitting: Oncology

## 2021-02-02 ENCOUNTER — Telehealth: Payer: Self-pay

## 2021-02-02 NOTE — Telephone Encounter (Signed)
Prior Approval received for VIIBRYD 40 MG effective 07/16/2019-07/14/2021 PA# [id number] with [employer] ID# [id number].

## 2021-02-14 ENCOUNTER — Ambulatory Visit (INDEPENDENT_AMBULATORY_CARE_PROVIDER_SITE_OTHER): Payer: BC Managed Care – PPO | Admitting: *Deleted

## 2021-02-14 DIAGNOSIS — J309 Allergic rhinitis, unspecified: Secondary | ICD-10-CM | POA: Diagnosis not present

## 2021-02-21 DIAGNOSIS — N6489 Other specified disorders of breast: Secondary | ICD-10-CM | POA: Diagnosis not present

## 2021-02-21 DIAGNOSIS — R922 Inconclusive mammogram: Secondary | ICD-10-CM | POA: Diagnosis not present

## 2021-02-21 DIAGNOSIS — R928 Other abnormal and inconclusive findings on diagnostic imaging of breast: Secondary | ICD-10-CM | POA: Diagnosis not present

## 2021-02-22 DIAGNOSIS — E1169 Type 2 diabetes mellitus with other specified complication: Secondary | ICD-10-CM | POA: Diagnosis not present

## 2021-02-22 DIAGNOSIS — E785 Hyperlipidemia, unspecified: Secondary | ICD-10-CM | POA: Diagnosis not present

## 2021-02-27 ENCOUNTER — Telehealth: Payer: Self-pay | Admitting: Hematology and Oncology

## 2021-02-27 ENCOUNTER — Other Ambulatory Visit: Payer: Self-pay

## 2021-02-27 ENCOUNTER — Inpatient Hospital Stay: Payer: BC Managed Care – PPO | Attending: Oncology | Admitting: Hematology and Oncology

## 2021-02-27 ENCOUNTER — Encounter: Payer: Self-pay | Admitting: Hematology and Oncology

## 2021-02-27 ENCOUNTER — Inpatient Hospital Stay: Payer: BC Managed Care – PPO

## 2021-02-27 VITALS — BP 174/98 | HR 98 | Temp 99.5°F | Resp 16 | Ht 65.0 in | Wt 252.4 lb

## 2021-02-27 DIAGNOSIS — Z170 Estrogen receptor positive status [ER+]: Secondary | ICD-10-CM | POA: Insufficient documentation

## 2021-02-27 DIAGNOSIS — D649 Anemia, unspecified: Secondary | ICD-10-CM | POA: Diagnosis not present

## 2021-02-27 DIAGNOSIS — C50912 Malignant neoplasm of unspecified site of left female breast: Secondary | ICD-10-CM | POA: Diagnosis not present

## 2021-02-27 DIAGNOSIS — Z79810 Long term (current) use of selective estrogen receptor modulators (SERMs): Secondary | ICD-10-CM | POA: Insufficient documentation

## 2021-02-27 DIAGNOSIS — R5383 Other fatigue: Secondary | ICD-10-CM | POA: Diagnosis not present

## 2021-02-27 DIAGNOSIS — C773 Secondary and unspecified malignant neoplasm of axilla and upper limb lymph nodes: Secondary | ICD-10-CM | POA: Diagnosis not present

## 2021-02-27 DIAGNOSIS — Z79899 Other long term (current) drug therapy: Secondary | ICD-10-CM | POA: Insufficient documentation

## 2021-02-27 LAB — BASIC METABOLIC PANEL
BUN: 20 (ref 4–21)
CO2: 28 — AB (ref 13–22)
Chloride: 101 (ref 99–108)
Creatinine: 1.1 (ref 0.5–1.1)
Glucose: 101
Potassium: 4.3 (ref 3.4–5.3)
Sodium: 136 — AB (ref 137–147)

## 2021-02-27 LAB — HEPATIC FUNCTION PANEL
ALT: 14 (ref 7–35)
AST: 22 (ref 13–35)
Alkaline Phosphatase: 87 (ref 25–125)
Bilirubin, Total: 0.2

## 2021-02-27 LAB — CBC AND DIFFERENTIAL
HCT: 40 (ref 36–46)
Hemoglobin: 13.6 (ref 12.0–16.0)
Neutrophils Absolute: 4.4
Platelets: 291 (ref 150–399)
WBC: 6.2

## 2021-02-27 LAB — COMPREHENSIVE METABOLIC PANEL
Albumin: 4.1 (ref 3.5–5.0)
Calcium: 9.2 (ref 8.7–10.7)

## 2021-02-27 LAB — CBC: RBC: 4.65 (ref 3.87–5.11)

## 2021-02-27 MED ORDER — ONDANSETRON HCL 4 MG PO TABS: 4.0000 mg | ORAL_TABLET | ORAL | 3 refills | Status: DC | PRN

## 2021-02-27 NOTE — Telephone Encounter (Signed)
Per 8/16 LOS, patient scheduled for Oct Appt's.  Gave patient Appt Summary

## 2021-02-27 NOTE — Progress Notes (Signed)
[facility]  [address] [place],  [place]  [address] [phone]  Clinic Day:  02/27/2021  Referring physician: Marco Collie, MD     CHIEF COMPLAINT:  CC: A 55 year old female with history of clinical stage IIB hormone receptor positive left breast cancer here to review imaging and 3 month evaluation  Current Treatment:  Tamoxifen 20 mg daily starting at the end of December  HISTORY OF PRESENT ILLNESS:  Laurie Simmons is a 55 y.o. female with clinical stage IIB (TX N2 M0) hormone receptor positive left breast cancer with left axillary adenopathy, but no obvious breast primary diagnosed in February.  She developed left axillary lymphadenopathy in December 2020 which did not improve with antibiotics.  Bilateral diagnostic mammogram and left breast ultrasound in January revealed 4 enlarged lymph nodes in the left axilla with the largest measuring 5.5 cm.  Ultrasound guided biopsy in February revealed poorly differentiated carcinoma.  Immunophenotype is consistent with metastatic carcinoma and with GATA-3 positivity, which is most consistent with metastatic breast carcinoma.  Estrogen receptors were positive at 95% and progesterone receptors were positive at 50.  HER2 was negative.  Ki67 was 20%.  Bilateral breast MRI in March did not reveal any evidence of primary malignancy.  It also revealed left axillary lymphadenopathy with 6 enlarged lymph nodes, the largest measuring 3 x 6 cm.  She has been seen by Dr. Noberto Retort to discuss mastectomy and lymph node dissection versus breast conservation.  She had been scheduled for a hysterectomy due to a large uterine fibroid measuring 9.8 cm, as well as uterine enlargement with Dr. Marvel Plan around the time of her diagnosis, so this has now been put on hold.  She had been on Provera 10 mg daily.  Provera was subsequently increased to 20 mg daily due to persistent heavy vaginal bleeding.  MRI brain did not reveal any metastatic  disease.  PET scan revealed enlarged hypermetabolic left subpectoral and axillary adenopathy, with an index node measuring 3.3 cm in short axis with maximum SUV 6.7.  No other hypermetabolic activity was observed.  Echocardiogram revealed ejection fraction between 60 and 65%.   She is receiving neoadjuvant chemotherapy with docetaxel/doxorubicin/cyclophosphamide (TAC) and had her 1st cycle on March 30th and has tolerated this fairly well.  She had worsening depression, being managed by her psychiatrist and Dr. Nyra Capes.  She reported insomnia so Dr. Nyra Capes had placed her on olanzapine 5 mg at bedtime.  The patient felt that this was too sedating, so we recommended she discuss this with Dr. Nyra Capes.  We had decreased her Provera to 10 mg daily, as her vaginal bleeding had improved.  She had a repeat echocardiogram on June 17th, which revealed normal left ventricular size and function with an ejection fraction of 60-65%, which was stable.  She completed 6 cycles of TAC chemotherapy in mid July.  CT chest, abdomen and pelvis in August did not reveal any evidence of metastatic disease.  Repeat MRI breast in August did not reveal any disease within the breast.  The left axillary lymph nodes had decreased by about 50%.  She underwent left mastectomy and axillary dissection on September 1st with Dr. Noberto Retort.  The final pathology did not reveal any malignancy within the breast.  9/26 lymph nodes were positive for metastasis, the largest measuring 15 mm.  She is premenopausal.  She was referred for post mastectomy radiation and completed this on November 23rd.  She was taking NSAID's for her back pain  but I had her stop that last month due to a rise in the creatinine to 1.6.  Her baseline creatinine has been mildly elevated at 1.3.  Bone density scan from September was normal.  At the end of December we had her start tamoxifen.    INTERVAL HISTORY:  [name] is here for 2 month evaluation. She continues on tamoxifen daily  with some nausea that is controlled by medication. She continues to have moderate fatigue and low energy. She has some neuropathy that is mainly isolated to her left hand. She does think she has some element of carpal tunnel to this wrist. She has off and on right flank and side pain. She still has some stiffness associated with her right hip; however, this is also improved. She has completed PT for her hip. She continues to be on hold with her hysterectomy. Her fibroid has decreased in size; however, her uterus remains too large for a vaginal hysterectomy. Her surgeon has asked that she lose weight before he will proceed with hysterectomy. She denies fever, chills, or vomiting. She denies shortness of breath, chest pain or cough. She denies issue with bowel or bladder. CBC and CMP are unremarkable today.   REVIEW OF SYSTEMS:  Review of Systems  Constitutional:  Positive for fatigue. Negative for appetite change, chills, diaphoresis, fever and unexpected weight change.  HENT:   Negative for hearing loss, lump/mass, mouth sores, nosebleeds, sore throat, tinnitus, trouble swallowing and voice change.   Eyes:  Negative for eye problems and icterus.  Respiratory:  Negative for chest tightness, cough, hemoptysis, shortness of breath and wheezing.   Cardiovascular:  Negative for chest pain, leg swelling and palpitations.  Gastrointestinal:  Positive for nausea. Negative for abdominal distention, abdominal pain, blood in stool, constipation, diarrhea, rectal pain and vomiting.  Endocrine: Positive for hot flashes.  Genitourinary:  Negative for bladder incontinence, difficulty urinating, dyspareunia, dysuria, frequency, hematuria and nocturia.   Musculoskeletal:  Positive for flank pain. Negative for arthralgias, back pain, gait problem, myalgias, neck pain and neck stiffness.       Right hip pain  Skin:  Negative for itching and wound.  Neurological:  Positive for numbness. Negative for dizziness, extremity  weakness, gait problem, headaches, light-headedness, seizures and speech difficulty.  Hematological:  Negative for adenopathy. Does not bruise/bleed easily.  Psychiatric/Behavioral:  Positive for depression. Negative for confusion, decreased concentration, sleep disturbance and suicidal ideas. The patient is not nervous/anxious.     VITALS:  Blood pressure (!) 174/98, pulse 98, temperature 99.5 F (37.5 C), temperature source Oral, resp. rate 16, height '5\' 5"'  (1.651 m), weight 252 lb 6.4 oz (114.5 kg), SpO2 98 %.  Wt Readings from Last 3 Encounters:  02/27/21 252 lb 6.4 oz (114.5 kg)  01/02/21 245 lb 11.2 oz (111.4 kg)  12/25/20 248 lb (112.5 kg)    Body mass index is 42 kg/m.  Performance status (ECOG): 1 - Symptomatic but completely ambulatory  PHYSICAL EXAM:  Physical Exam Constitutional:      General: She is not in acute distress.    Appearance: Normal appearance. She is normal weight. She is not ill-appearing, toxic-appearing or diaphoretic.  HENT:     Head: Normocephalic and atraumatic.     Nose: Nose normal. No congestion or rhinorrhea.     Mouth/Throat:     Mouth: Mucous membranes are moist.     Pharynx: Oropharynx is clear. No oropharyngeal exudate or posterior oropharyngeal erythema.  Eyes:     General: No scleral  icterus.       Right eye: No discharge.        Left eye: No discharge.     Extraocular Movements: Extraocular movements intact.     Conjunctiva/sclera: Conjunctivae normal.     Pupils: Pupils are equal, round, and reactive to light.  Neck:     Vascular: No carotid bruit.  Cardiovascular:     Rate and Rhythm: Normal rate and regular rhythm.     Heart sounds: No murmur heard.   No friction rub. No gallop.  Pulmonary:     Effort: Pulmonary effort is normal. No respiratory distress.     Breath sounds: Normal breath sounds. No stridor. No wheezing, rhonchi or rales.  Chest:     Chest wall: No mass, lacerations, deformity, swelling, tenderness, crepitus or  edema. There is no dullness to percussion.  Breasts:    Breasts are symmetrical.     Right: Normal. No swelling, bleeding, inverted nipple, mass, nipple discharge, skin change or tenderness.     Left: Absent. No mass, skin change or tenderness.  Abdominal:     General: Abdomen is flat. Bowel sounds are normal. There is no distension.     Palpations: There is no mass.     Tenderness: There is no abdominal tenderness. There is no right CVA tenderness, left CVA tenderness, guarding or rebound.     Hernia: No hernia is present.  Musculoskeletal:        General: No swelling, tenderness, deformity or signs of injury. Normal range of motion.     Cervical back: Normal range of motion and neck supple. No rigidity or tenderness.     Right lower leg: No edema.     Left lower leg: No edema.  Lymphadenopathy:     Cervical: No cervical adenopathy.     Upper Body:     Right upper body: No supraclavicular, axillary or pectoral adenopathy.     Left upper body: No supraclavicular, axillary or pectoral adenopathy.  Skin:    General: Skin is warm and dry.     Capillary Refill: Capillary refill takes less than 2 seconds.     Coloration: Skin is not jaundiced or pale.     Findings: No bruising, erythema or lesion.  Neurological:     General: No focal deficit present.     Mental Status: She is alert and oriented to person, place, and time. Mental status is at baseline.     Cranial Nerves: No cranial nerve deficit.     Sensory: No sensory deficit.     Motor: No weakness.     Coordination: Coordination normal.     Gait: Gait normal.     Deep Tendon Reflexes: Reflexes normal.  Psychiatric:        Mood and Affect: Mood normal.        Behavior: Behavior normal.        Thought Content: Thought content normal.        Judgment: Judgment normal.    LABS:   CBC Latest Ref Rng & Units 02/27/2021 01/02/2021 12/05/2020  WBC - 6.2 8.5 6.6  Hemoglobin 12.0 - 16.0 13.6 14.0 14.1  Hematocrit 36 - 46 40 42 43   Platelets 150 - 399 291 289 291   CMP Latest Ref Rng & Units 02/27/2021 01/02/2021 12/05/2020  Glucose 70 - 99 mg/dL - - -  BUN 4 - '21 20 14 13  ' Creatinine 0.5 - 1.1 1.1 1.1 1.1  Sodium 137 - 147 136(A) 139  138  Potassium 3.4 - 5.3 4.3 3.5 3.7  Chloride 99 - 108 101 103 102  CO2 13 - 22 28(A) 27(A) 27(A)  Calcium 8.7 - 10.7 9.2 9.3 9.3  Total Protein 6.0 - 8.3 g/dL - - -  Total Bilirubin 0.3 - 1.2 mg/dL - - -  Alkaline Phos 25 - 125 87 76 82  AST 13 - 35 '22 20 20  ' ALT 7 - 35 '14 15 30    ' Lab Results  Component Value Date   LDH 185 10/20/2008     STUDIES:   Allergies:  Allergies  Allergen Reactions   Effexor [Venlafaxine] Palpitations   Nsaids Other (See Comments)    Pt reports kidney disease Other reaction(s): Other (See Comments) Pt reports kidney disease Pt reports kidney disease    Current Medications: Current Outpatient Medications  Medication Sig Dispense Refill   ondansetron (ZOFRAN) 4 MG tablet Take 1 tablet (4 mg total) by mouth every 4 (four) hours as needed for nausea. 90 tablet 3   albuterol (VENTOLIN HFA) 108 (90 Base) MCG/ACT inhaler INHALE 2 PUFFS INTO THE LUNGS EVERY 4 TO 6 HOURS AS NEEDED FOR COUGH OR WHEEZING 18 g 1   ARIPiprazole (ABILIFY) 2 MG tablet Take 1 tablet (2 mg total) by mouth daily. 90 tablet 1   Azelastine HCl 0.15 % SOLN Can use one spray in each nostril two times daily if needed. 90 mL 1   clorazepate (TRANXENE) 3.75 MG tablet Take one tablet up to four times daily as needed for anxiety/panic attacks. 120 tablet 2   docusate sodium (COLACE) 50 MG capsule Take 50 mg by mouth 2 (two) times daily.     famotidine (PEPCID) 40 MG tablet TAKE 1 TABLET BY MOUTH TWICE DAILY AS DIRECTED 60 tablet 5   fluticasone (FLONASE) 50 MCG/ACT nasal spray      fluticasone-salmeterol (ADVAIR) 250-50 MCG/ACT AEPB Inhale one puff twice daily to prevent cough or wheeze. Rinse mouth after use. 60 each 5   folic acid (FOLVITE) 1 MG tablet TAKE 1 TABLET BY MOUTH  DAILY 30 tablet 5   ipratropium (ATROVENT) 0.06 % nasal spray Use 2 sprays in each nostril every 6 hours to dry up nose if needed (Patient not taking: Reported on 12/25/2020) 15 mL 5   loratadine (CLARITIN) 10 MG tablet Take 1 tablet (10 mg total) by mouth daily. 90 tablet 3   montelukast (SINGULAIR) 10 MG tablet Take by mouth.     ondansetron (ZOFRAN) 4 MG tablet TAKE 1 TABLET BY MOUTH EVERY 4 HOURS AS NEEDED FOR NAUSEA 30 tablet 0   potassium chloride (KLOR-CON) 10 MEQ tablet TAKE 1 TABLET BY MOUTH TWICE DAILY 60 tablet 1   prochlorperazine (COMPAZINE) 10 MG tablet TAKE 1 TABLET BY MOUTH EVERY 6 HOURS AS NEEDED FOR NAUSEA 90 tablet 1   rosuvastatin (CRESTOR) 10 MG tablet Take 10 mg by mouth. Takes twice a week  1   Semaglutide,0.25 or 0.5MG/DOS, (OZEMPIC, 0.25 OR 0.5 MG/DOSE,) 2 MG/1.5ML SOPN Inject 0.5 mg into the skin. Once weekly     SYMBICORT 160-4.5 MCG/ACT inhaler INHALE 2 PUFFS BY MOUTH TWICE DAILY TO PREVENT COUGH OR WHEEZE. RINSE, GARGLE AND SPIT AFTER USE 10.2 g 2   tamoxifen (NOLVADEX) 20 MG tablet TAKE 1 TABLET(20 MG) BY MOUTH DAILY 90 tablet 3   topiramate (TOPAMAX) 50 MG tablet Take 1 tablet (50 mg total) by mouth daily. 90 tablet 3   UNABLE TO FIND Allergy injections for cats, grass, and  dust.     valsartan-hydrochlorothiazide (DIOVAN-HCT) 320-25 MG tablet Take 1 tablet by mouth daily.  0   VIIBRYD 40 MG TABS Take 1 tablet (40 mg total) by mouth daily. 90 tablet 1   vitamin B-12 (CYANOCOBALAMIN) 500 MCG tablet Take 500 mcg by mouth daily.     No current facility-administered medications for this visit.     ASSESSMENT & PLAN:   Assessment:   1. Clinical stage IIB (TXN2M0), grade 3, ductal carcinoma with no breast primary.  She received neoadjuvant chemotherapy with docetaxel/doxorubicin/cyclophosphamide completed in July.  She has now undergone left mastectomy and axillary dissection, with no breast primary found and 9/26 lymph nodes positive for metastasis. She completed  adjuvant radiation at the end of November, and was placed on hormonal therapy with tamoxifen at the end of December.  When she undergoes bilateral salpingo oophorectomy, we could consider an aromatase inhibitor.  Baseline bone density scan was completely normal. She continues tamoxifen with complaints of nausea and fatigue.   2. Large uterine fibroid.  She is planned for a hysterectomy/BSO this year pending weight loss.  She continues to have sporadic menstrual cycles.  3. Depression and anxiety, managed by Dr. Nyra Capes and her psychiatrist. She has been placed on Celexa per her physician.   4.  Small hard nodule in the mid mastectomy incision, stable. This is unchanged.  5. Right hip pain.There is noted a rounded calcific density lateral and anterior to the distal right femur which corresponds in location to area of abnormal uptake seen on bone scan. Calcified soft tissue metastasis cannot be excluded. MRI was benign. She has completed physical therapy.  7. Neuropathy. We will continue to monitor. She does not wish to treat at this time as it is improving.   Plan: She will continue with tamoxifen daily. I will obtain thyroid studies today to assess fatigue and I will call her with results. I will obtain CT imaging to assess this ongoing right flank pain and we will review by phone unless a visit is necessary. Otherwise, she will return to clinic in 2 months for evaluation with Dr. Hinton Rao.   She verbalizes understanding of and agreement to the plans discussed today. She knows to call the office should any new questions or concerns arise.    Melodye Ped, NP [facility] AT [facility] [address] [place] [address] Dept: [phone] Dept Fax: [phone]

## 2021-02-28 ENCOUNTER — Ambulatory Visit (INDEPENDENT_AMBULATORY_CARE_PROVIDER_SITE_OTHER): Payer: BC Managed Care – PPO | Admitting: *Deleted

## 2021-02-28 DIAGNOSIS — J309 Allergic rhinitis, unspecified: Secondary | ICD-10-CM

## 2021-02-28 DIAGNOSIS — Z6841 Body Mass Index (BMI) 40.0 and over, adult: Secondary | ICD-10-CM | POA: Diagnosis not present

## 2021-02-28 LAB — THYROID PANEL WITH TSH
Free Thyroxine Index: 1.2 (ref 1.2–4.9)
T3 Uptake Ratio: 20 % — ABNORMAL LOW (ref 24–39)
T4, Total: 6.1 ug/dL (ref 4.5–12.0)
TSH: 3.07 u[IU]/mL (ref 0.450–4.500)

## 2021-03-01 ENCOUNTER — Telehealth: Payer: Self-pay

## 2021-03-01 NOTE — Telephone Encounter (Signed)
@   1619 - I notified t that her thyroid levels were normal. She verbalized understanding.   Per Melissa,NP:  Do you care to let her know her thyroid studies were normal.

## 2021-03-02 DIAGNOSIS — F339 Major depressive disorder, recurrent, unspecified: Secondary | ICD-10-CM | POA: Diagnosis not present

## 2021-03-02 DIAGNOSIS — C779 Secondary and unspecified malignant neoplasm of lymph node, unspecified: Secondary | ICD-10-CM | POA: Diagnosis not present

## 2021-03-02 DIAGNOSIS — E1169 Type 2 diabetes mellitus with other specified complication: Secondary | ICD-10-CM | POA: Diagnosis not present

## 2021-03-05 DIAGNOSIS — C773 Secondary and unspecified malignant neoplasm of axilla and upper limb lymph nodes: Secondary | ICD-10-CM | POA: Diagnosis not present

## 2021-03-05 DIAGNOSIS — Z6841 Body Mass Index (BMI) 40.0 and over, adult: Secondary | ICD-10-CM | POA: Diagnosis not present

## 2021-03-14 ENCOUNTER — Ambulatory Visit (INDEPENDENT_AMBULATORY_CARE_PROVIDER_SITE_OTHER): Payer: BC Managed Care – PPO | Admitting: *Deleted

## 2021-03-14 DIAGNOSIS — J309 Allergic rhinitis, unspecified: Secondary | ICD-10-CM | POA: Diagnosis not present

## 2021-03-14 DIAGNOSIS — Z6841 Body Mass Index (BMI) 40.0 and over, adult: Secondary | ICD-10-CM | POA: Diagnosis not present

## 2021-03-28 DIAGNOSIS — J029 Acute pharyngitis, unspecified: Secondary | ICD-10-CM | POA: Diagnosis not present

## 2021-03-28 DIAGNOSIS — J020 Streptococcal pharyngitis: Secondary | ICD-10-CM | POA: Diagnosis not present

## 2021-03-28 DIAGNOSIS — Z20822 Contact with and (suspected) exposure to covid-19: Secondary | ICD-10-CM | POA: Diagnosis not present

## 2021-03-29 ENCOUNTER — Other Ambulatory Visit: Payer: Self-pay | Admitting: Allergy and Immunology

## 2021-03-29 DIAGNOSIS — J301 Allergic rhinitis due to pollen: Secondary | ICD-10-CM | POA: Diagnosis not present

## 2021-03-29 NOTE — Progress Notes (Signed)
VIALS MADE. EXP 03-29-22 

## 2021-03-30 DIAGNOSIS — Z20822 Contact with and (suspected) exposure to covid-19: Secondary | ICD-10-CM | POA: Diagnosis not present

## 2021-03-30 DIAGNOSIS — U071 COVID-19: Secondary | ICD-10-CM | POA: Diagnosis not present

## 2021-04-10 ENCOUNTER — Ambulatory Visit (INDEPENDENT_AMBULATORY_CARE_PROVIDER_SITE_OTHER): Payer: 59 | Admitting: Adult Health

## 2021-04-10 ENCOUNTER — Encounter: Payer: Self-pay | Admitting: Adult Health

## 2021-04-10 DIAGNOSIS — F410 Panic disorder [episodic paroxysmal anxiety] without agoraphobia: Secondary | ICD-10-CM

## 2021-04-10 DIAGNOSIS — F39 Unspecified mood [affective] disorder: Secondary | ICD-10-CM | POA: Diagnosis not present

## 2021-04-10 DIAGNOSIS — F411 Generalized anxiety disorder: Secondary | ICD-10-CM | POA: Diagnosis not present

## 2021-04-10 DIAGNOSIS — F331 Major depressive disorder, recurrent, moderate: Secondary | ICD-10-CM | POA: Diagnosis not present

## 2021-04-10 NOTE — Progress Notes (Signed)
Laurie Simmons [id number] Dec 12, 1965 55 y.o.  Virtual Visit via Telephone Note  I connected with pt on 04/10/21 at  9:40 AM EDT by telephone and verified that I am speaking with the correct person using two identifiers.   I discussed the limitations, risks, security and privacy concerns of performing an evaluation and management service by telephone and the availability of in person appointments. I also discussed with the patient that there may be a patient responsible charge related to this service. The patient expressed understanding and agreed to proceed.   I discussed the assessment and treatment plan with the patient. The patient was provided an opportunity to ask questions and all were answered. The patient agreed with the plan and demonstrated an understanding of the instructions.   The patient was advised to call back or seek an in-person evaluation if the symptoms worsen or if the condition fails to improve as anticipated.  I provided 25 minutes of non-face-to-face time during this encounter.  The patient was located at home.  The provider was located at [place].   Aloha Gell, NP   Subjective:   Patient ID:  Laurie Simmons is a 55 y.o. (DOB 1966-04-10) female.  Chief Complaint: No chief complaint on file.   HPI  Naidelin Gugliotta Wellborn presents for follow-up of  anxiety, panic attacks, depression, and mood disorder.  Describes mood today as "ok". Pleasant. Tearful at times. Mood symptoms - reports decreased depression, anxiety - "random" - gets triggered from thinking about cancer and the start of the school - thinking about scohool is a trigger", and irritability. Denies panic attacks - "mini anxiety issues". Reports some mood swings - "none lately" Stating "I'm doing pretty good". Feels like she has wanted to sleep more, but is not necessarily tired. She and family recovering from Covid and Strep throat. Plans to see oncologist next month.  Feels like current medications are  working well for her. Stable interest and motivation. Taking medications as prescribed. Energy levels varies. Active, does not have a regular exercise routine with current physical disabilities. Walking most days.Has finished with P/T. Enjoys some usual interests and activities. Married. Lives with husband and their two children - son 29 and daughter 68 - home for the summer. Mostly staying home. Appetite adequate. Weight gain - 240 to 245 pounds. Working with weight loss management. Sleeps better some nights than others - waking up during the night. Averages 7 hours.  Focus and concentration difficulties. Completing tasks. Managing some aspects of household. Currently disabled with cancer treatment. Denies SI or HI.  Denies AH or VH.    Review of Systems:  Review of Systems  Musculoskeletal:  Negative for gait problem.  Neurological:  Negative for tremors.  Psychiatric/Behavioral:         Please refer to HPI   Medications: I have reviewed the patient's current medications.  Current Outpatient Medications  Medication Sig Dispense Refill   albuterol (VENTOLIN HFA) 108 (90 Base) MCG/ACT inhaler INHALE 2 PUFFS INTO THE LUNGS EVERY 4 TO 6 HOURS AS NEEDED FOR COUGH OR WHEEZING 18 g 1   ARIPiprazole (ABILIFY) 2 MG tablet Take 1 tablet (2 mg total) by mouth daily. 90 tablet 1   Azelastine HCl 0.15 % SOLN Can use one spray in each nostril two times daily if needed. 90 mL 1   clorazepate (TRANXENE) 3.75 MG tablet Take one tablet up to four times daily as needed for anxiety/panic attacks. 120 tablet 2   docusate sodium (  COLACE) 50 MG capsule Take 50 mg by mouth 2 (two) times daily.     famotidine (PEPCID) 40 MG tablet TAKE 1 TABLET BY MOUTH TWICE DAILY AS DIRECTED 60 tablet 2   fluticasone (FLONASE) 50 MCG/ACT nasal spray      fluticasone-salmeterol (ADVAIR) 250-50 MCG/ACT AEPB Inhale one puff twice daily to prevent cough or wheeze. Rinse mouth after use. 60 each 5   folic acid (FOLVITE) 1 MG  tablet TAKE 1 TABLET BY MOUTH DAILY 30 tablet 5   ipratropium (ATROVENT) 0.06 % nasal spray Use 2 sprays in each nostril every 6 hours to dry up nose if needed (Patient not taking: Reported on 12/25/2020) 15 mL 5   loratadine (CLARITIN) 10 MG tablet Take 1 tablet (10 mg total) by mouth daily. 90 tablet 3   montelukast (SINGULAIR) 10 MG tablet Take by mouth.     ondansetron (ZOFRAN) 4 MG tablet TAKE 1 TABLET BY MOUTH EVERY 4 HOURS AS NEEDED FOR NAUSEA 30 tablet 0   ondansetron (ZOFRAN) 4 MG tablet Take 1 tablet (4 mg total) by mouth every 4 (four) hours as needed for nausea. 90 tablet 3   potassium chloride (KLOR-CON) 10 MEQ tablet TAKE 1 TABLET BY MOUTH TWICE DAILY 60 tablet 1   prochlorperazine (COMPAZINE) 10 MG tablet TAKE 1 TABLET BY MOUTH EVERY 6 HOURS AS NEEDED FOR NAUSEA 90 tablet 1   rosuvastatin (CRESTOR) 10 MG tablet Take 10 mg by mouth. Takes twice a week  1   Semaglutide,0.25 or 0.5MG/DOS, (OZEMPIC, 0.25 OR 0.5 MG/DOSE,) 2 MG/1.5ML SOPN Inject 0.5 mg into the skin. Once weekly     SYMBICORT 160-4.5 MCG/ACT inhaler INHALE 2 PUFFS BY MOUTH TWICE DAILY TO PREVENT COUGH OR WHEEZE. RINSE, GARGLE AND SPIT AFTER USE 10.2 g 2   tamoxifen (NOLVADEX) 20 MG tablet TAKE 1 TABLET(20 MG) BY MOUTH DAILY 90 tablet 3   topiramate (TOPAMAX) 50 MG tablet Take 1 tablet (50 mg total) by mouth daily. 90 tablet 3   UNABLE TO FIND Allergy injections for cats, grass, and dust.     valsartan-hydrochlorothiazide (DIOVAN-HCT) 320-25 MG tablet Take 1 tablet by mouth daily.  0   VIIBRYD 40 MG TABS Take 1 tablet (40 mg total) by mouth daily. 90 tablet 1   vitamin B-12 (CYANOCOBALAMIN) 500 MCG tablet Take 500 mcg by mouth daily.     No current facility-administered medications for this visit.    Medication Side Effects: None  Allergies:  Allergies  Allergen Reactions   Effexor [Venlafaxine] Palpitations   Nsaids Other (See Comments)    Pt reports kidney disease Other reaction(s): Other (See Comments) Pt  reports kidney disease Pt reports kidney disease    Past Medical History:  Diagnosis Date   Anxiety    Arthritis    Asthma    Breast cancer ([place]) 2021   Carpal tunnel syndrome    Carpal tunnel syndrome    bilateral   Chronic kidney disease    stage 3   Depression    Diabetes (HCC)    GERD (gastroesophageal reflux disease)    Hypertension    Irritable bowel syndrome    Severe anemia [id number]    Family History  Problem Relation Age of Onset   Pulmonary fibrosis Father    Cancer Maternal Grandmother        carcinoma of the vulva and possible ovarian cancer   Prostate cancer Maternal Grandfather    Lung cancer Paternal Grandfather  heavy smoker    Social History   Socioeconomic History   Marital status: Married    Spouse name: Phil   Number of children: 2   Years of education: Not on file   Highest education level: Not on file  Occupational History   Not on file  Tobacco Use   Smoking status: Never   Smokeless tobacco: Never  Vaping Use   Vaping Use: Never used  Substance and Sexual Activity   Alcohol use: No   Drug use: No   Sexual activity: Yes  Other Topics Concern   Not on file  Social History Narrative   Not on file   Social Determinants of Health   Financial Resource Strain: Not on file  Food Insecurity: Not on file  Transportation Needs: Not on file  Physical Activity: Not on file  Stress: Not on file  Social Connections: Not on file  Intimate Partner Violence: Not on file    Past Medical History, Surgical history, Social history, and Family history were reviewed and updated as appropriate.   Please see review of systems for further details on the patient's review from today.   Objective:   Physical Exam:  There were no vitals taken for this visit.  Physical Exam Constitutional:      General: She is not in acute distress. Musculoskeletal:        General: No deformity.  Neurological:     Mental Status: She is alert and oriented  to person, place, and time.     Coordination: Coordination normal.  Psychiatric:        Attention and Perception: Attention and perception normal. She does not perceive auditory or visual hallucinations.        Mood and Affect: Mood normal. Mood is not anxious or depressed. Affect is not labile, blunt, angry or inappropriate.        Speech: Speech normal.        Behavior: Behavior normal.        Thought Content: Thought content normal. Thought content is not paranoid or delusional. Thought content does not include homicidal or suicidal ideation. Thought content does not include homicidal or suicidal plan.        Cognition and Memory: Cognition and memory normal.        Judgment: Judgment normal.     Comments: Insight intact    Lab Review:     Component Value Date/Time   NA 136 (A) 02/27/2021 0000   K 4.3 02/27/2021 0000   CL 101 02/27/2021 0000   CO2 28 (A) 02/27/2021 0000   GLUCOSE 97 10/21/2008 0545   BUN 20 02/27/2021 0000   CREATININE 1.1 02/27/2021 0000   CREATININE 0.76 10/20/2008 2105   CALCIUM 9.2 02/27/2021 0000   PROT 6.0 10/20/2008 2105   ALBUMIN 4.1 02/27/2021 0000   AST 22 02/27/2021 0000   ALT 14 02/27/2021 0000   ALKPHOS 87 02/27/2021 0000   BILITOT 0.2 (L) 10/20/2008 2105   GFRNONAA >60 10/20/2008 2105   GFRAA  10/20/2008 2105    >60        The eGFR has been calculated using the MDRD equation. This calculation has not been validated in all clinical situations. eGFR's persistently <60 mL/min signify possible Chronic Kidney Disease.       Component Value Date/Time   WBC 6.2 02/27/2021 0000   WBC 12.3 (H) 10/22/2008 0530   RBC 4.65 02/27/2021 0000   HGB 13.6 02/27/2021 0000   HGB 11.6 12/23/2018 1146  HCT 40 02/27/2021 0000   HCT 36.4 12/23/2018 1146   PLT 291 02/27/2021 0000   MCV 87 11/07/2020 0000   MCH 22.3 (L) 12/23/2018 1146   MCHC 31.9 12/23/2018 1146   MCHC 33.7 10/22/2008 0530   RDW 16.9 (H) 12/23/2018 1146   LYMPHSABS 2.8 12/23/2018  1146   EOSABS 0.2 12/23/2018 1146   BASOSABS 0.1 12/23/2018 1146    No results found for: POCLITH, LITHIUM   No results found for: PHENYTOIN, PHENOBARB, VALPROATE, CBMZ   .res Assessment: Plan:    Plan:  1. Viibryd 53m daily 2. Tranxene 3.7860mup to 4 times daily 3. Topamax 50 mg daily 4. Abilify 60m7maily  RTC 3 months  Patient advised to contact office with any questions, adverse effects, or acute worsening in signs and symptoms.  Discussed potential benefits, risk, and side effects of benzodiazepines to include potential risk of tolerance and dependence, as well as possible drowsiness. Advised patient not to drive if experiencing drowsiness and to take lowest possible effective dose to minimize risk of dependence and tolerance.  Discussed potential metabolic side effects associated with atypical antipsychotics, as well as potential risk for movement side effects. Advised pt to contact office if movement side effects occur.      Diagnoses and all orders for this visit:  Episodic mood disorder (HCC)  Generalized anxiety disorder  Panic attacks  Major depressive disorder, recurrent episode, moderate ([place] Please see After Visit Summary for patient specific instructions.  Future Appointments  Date Time Provider [place]Fair[place] Park0/17/2022  9:00 AM [facility] None  04/30/2021  9:30 AM [place] KaplanD [facility] None  06/27/2021  8:30 AM Kozlow, EriDonnamarie PoagD [facility] None    No orders of the defined types were placed in this encounter.     -------------------------------

## 2021-04-11 ENCOUNTER — Ambulatory Visit (INDEPENDENT_AMBULATORY_CARE_PROVIDER_SITE_OTHER): Payer: BC Managed Care – PPO | Admitting: *Deleted

## 2021-04-11 DIAGNOSIS — J309 Allergic rhinitis, unspecified: Secondary | ICD-10-CM | POA: Diagnosis not present

## 2021-04-24 ENCOUNTER — Other Ambulatory Visit: Payer: Self-pay | Admitting: Oncology

## 2021-04-24 DIAGNOSIS — E876 Hypokalemia: Secondary | ICD-10-CM

## 2021-04-24 NOTE — Progress Notes (Signed)
[name]  [address] [place],    [address] [phone]  Clinic Day:  04/30/2021  Referring physician: Marco Collie, MD   This document serves as a record of services personally performed by Hosie Poisson, MD. It was created on their behalf by [facility], a trained medical scribe. The creation of this record is based on the scribe's personal observations and the provider's statements to them.  CHIEF COMPLAINT:  CC: Clinical stage IIB hormone receptor positive left breast cancer  Current Treatment:  Tamoxifen 20 mg daily   HISTORY OF PRESENT ILLNESS:  Laurie Simmons is a 55 y.o. female with clinical stage IIB (TX N2 M0) hormone receptor positive left breast cancer with left axillary adenopathy, but no obvious breast primary diagnosed in February.  She developed left axillary lymphadenopathy in December 2020 which did not improve with antibiotics.  Bilateral diagnostic mammogram and left breast ultrasound in January revealed 4 enlarged lymph nodes in the left axilla with the largest measuring 5.5 cm.  Ultrasound guided biopsy in February revealed poorly differentiated carcinoma.  Immunophenotype is consistent with metastatic carcinoma and with GATA-3 positivity, which is most consistent with metastatic breast carcinoma.  Estrogen receptors were positive at 95% and progesterone receptors were positive at 50.  HER2 was negative.  Ki67 was 20%.  Bilateral breast MRI in March did not reveal any evidence of primary malignancy.  It also revealed left axillary lymphadenopathy with 6 enlarged lymph nodes, the largest measuring 3 x 6 cm.  She has been seen by Dr. Noberto Retort to discuss mastectomy and lymph node dissection versus breast conservation.  She had been scheduled for a hysterectomy due to a large uterine fibroid measuring 9.8 cm, as well as uterine enlargement with Dr. Marvel Plan around the time of her diagnosis, so this has now been put on hold.  She  had been on Provera 10 mg daily.  Provera was subsequently increased to 20 mg daily due to persistent heavy vaginal bleeding.  MRI brain did not reveal any metastatic disease.  PET scan revealed enlarged hypermetabolic left subpectoral and axillary adenopathy, with an index node measuring 3.3 cm in short axis with maximum SUV 6.7.  No other hypermetabolic activity was observed.  Echocardiogram revealed ejection fraction between 60 and 65%.   She is receiving neoadjuvant chemotherapy with docetaxel/doxorubicin/cyclophosphamide (TAC) and had her 1st cycle on March 30th and has tolerated this fairly well.  She had worsening depression, being managed by her psychiatrist and Dr. Nyra Capes.  She reported insomnia so Dr. Nyra Capes had placed her on olanzapine 5 mg at bedtime.  The patient felt that this was too sedating, so we recommended she discuss this with Dr. Nyra Capes.  We had decreased her Provera to 10 mg daily, as her vaginal bleeding had improved.  She had a repeat echocardiogram on June 17th, which revealed normal left ventricular size and function with an ejection fraction of 60-65%, which was stable.  She completed 6 cycles of TAC chemotherapy in mid July.  CT chest, abdomen and pelvis in August did not reveal any evidence of metastatic disease.  Repeat MRI breast in August did not reveal any disease within the breast.  The left axillary lymph nodes had decreased by about 50%.  She underwent left mastectomy and axillary dissection on September 1st with Dr. Noberto Retort.  The final pathology did not reveal any malignancy within the breast.  9/26 lymph nodes were positive for metastasis, the largest measuring 15 mm.  She is  premenopausal.  She was referred for post mastectomy radiation and completed this on November 23rd.  She was taking NSAID's for her back pain but I had her stop that last month due to a rise in the creatinine to 1.6.  Her baseline creatinine has been mildly elevated at 1.3.  Bone density scan from  September was normal.  At the end of December we had her start tamoxifen.  She continues to be on hold with her hysterectomy. Her fibroid has decreased in size; however, her uterus remains too large for a vaginal hysterectomy. Her surgeon has asked that she lose weight before he will proceed with hysterectomy.  INTERVAL HISTORY:  [name] is here for routine follow up. She had her annual mammogram and visit with Dr.Lininger in August which was all clear. She continues tamoxifen daily without significant difficulty other than some nausea, but is willing to stay on it for now. We discussed an alternative with aromatase inhibitor of monthly goserelin injections. She complaints of severe anxiety and depression, but this has been a lifelong problem, and she denies any suicidal ideation. She contracted COVID in September and has residual fatigue. She complaints of soreness of the left mastectomy with palpation. Her creatinine of 1.1 is stable, and the rest is unremarkable. Her  appetite is good, and her weight is stable since her last visit.  She denies fever, chills or other signs of infection.  She denies vomiting, bowel issues, or abdominal pain.  She denies sore throat, cough, dyspnea, or chest pain.  REVIEW OF SYSTEMS:  Review of Systems  Constitutional:  Positive for fatigue. Negative for appetite change, chills, fever and unexpected weight change.  HENT:  Negative.    Eyes: Negative.   Respiratory: Negative.  Negative for chest tightness, cough, hemoptysis, shortness of breath and wheezing.   Cardiovascular: Negative.  Negative for chest pain, leg swelling and palpitations.  Gastrointestinal:  Positive for nausea. Negative for abdominal distention, abdominal pain, blood in stool, constipation, diarrhea and vomiting.  Endocrine: Negative.   Genitourinary: Negative.  Negative for difficulty urinating, dysuria, frequency and hematuria.   Musculoskeletal:  Negative for arthralgias, back pain, flank pain,  gait problem and myalgias.       Soreness of the left mastectomy  Skin: Negative.   Neurological: Negative.  Negative for dizziness, extremity weakness, gait problem, headaches, light-headedness, numbness, seizures and speech difficulty.  Hematological: Negative.   Psychiatric/Behavioral:  Positive for depression. Negative for sleep disturbance. The patient is nervous/anxious.     VITALS:  Blood pressure (!) 143/92, pulse (!) 103, temperature 98.9 F (37.2 C), temperature source Oral, resp. rate 18, height '5\' 5"'  (1.651 m), weight 251 lb 14.4 oz (114.3 kg), SpO2 97 %.  Wt Readings from Last 3 Encounters:  04/30/21 251 lb 14.4 oz (114.3 kg)  02/27/21 252 lb 6.4 oz (114.5 kg)  01/02/21 245 lb 11.2 oz (111.4 kg)    Body mass index is 41.92 kg/m.  Performance status (ECOG): 1 - Symptomatic but completely ambulatory  PHYSICAL EXAM:  Physical Exam Constitutional:      General: She is not in acute distress.    Appearance: Normal appearance. She is normal weight.  HENT:     Head: Normocephalic and atraumatic.  Eyes:     General: No scleral icterus.    Extraocular Movements: Extraocular movements intact.     Conjunctiva/sclera: Conjunctivae normal.     Pupils: Pupils are equal, round, and reactive to light.  Cardiovascular:     Rate and Rhythm:  Normal rate and regular rhythm.     Pulses: Normal pulses.     Heart sounds: Normal heart sounds. No murmur heard.   No friction rub. No gallop.  Pulmonary:     Effort: Pulmonary effort is normal. No respiratory distress.     Breath sounds: Normal breath sounds.  Chest:  Breasts:    Right: Normal.     Left: Absent.     Comments: Right breast is without masses. Left mastectomy has some firmness in the left upper chest, superior to the mastectomy incision. She has 2 tiny tender nodules of the left mastectomy site, one at the medial aspect and the other inferior to the mid incision. I feel these are small neuromas. Abdominal:     General:  Bowel sounds are normal. There is no distension.     Palpations: Abdomen is soft. There is no hepatomegaly, splenomegaly or mass.     Tenderness: There is no abdominal tenderness.  Musculoskeletal:        General: Normal range of motion.     Cervical back: Normal range of motion and neck supple.     Right lower leg: No edema.     Left lower leg: No edema.  Lymphadenopathy:     Cervical: No cervical adenopathy.  Skin:    General: Skin is warm and dry.  Neurological:     General: No focal deficit present.     Mental Status: She is alert and oriented to person, place, and time. Mental status is at baseline.  Psychiatric:        Mood and Affect: Mood normal.        Behavior: Behavior normal.        Thought Content: Thought content normal.        Judgment: Judgment normal.    LABS:   CBC Latest Ref Rng & Units 04/30/2021 02/27/2021 01/02/2021  WBC - 6.6 6.2 8.5  Hemoglobin 12.0 - 16.0 13.8 13.6 14.0  Hematocrit 36 - 46 42 40 42  Platelets 150 - 399 265 291 289   CMP Latest Ref Rng & Units 04/30/2021 02/27/2021 01/02/2021  Glucose 70 - 99 mg/dL - - -  BUN 4 - 21 - 20 14  Creatinine 0.5 - 1.1 1.1 1.1 1.1  Sodium 137 - 147 140 136(A) 139  Potassium 3.4 - 5.3 3.8 4.3 3.5  Chloride 99 - 108 104 101 103  CO2 13 - 22 28(A) 28(A) 27(A)  Calcium 8.7 - 10.7 9.1 9.2 9.3  Total Protein 6.0 - 8.3 g/dL - - -  Total Bilirubin 0.3 - 1.2 mg/dL - - -  Alkaline Phos 25 - 125 98 87 76  AST 13 - 35 '20 22 20  ' ALT 7 - 35 '15 14 15    ' Lab Results  Component Value Date   LDH 185 10/20/2008     STUDIES:   Allergies:  Allergies  Allergen Reactions   Effexor [Venlafaxine] Palpitations   Nsaids Other (See Comments)    Pt reports kidney disease Other reaction(s): Other (See Comments) Pt reports kidney disease Pt reports kidney disease    Current Medications: Current Outpatient Medications  Medication Sig Dispense Refill   albuterol (VENTOLIN HFA) 108 (90 Base) MCG/ACT inhaler INHALE 2  PUFFS INTO THE LUNGS EVERY 4 TO 6 HOURS AS NEEDED FOR COUGH OR WHEEZING 18 g 1   ARIPiprazole (ABILIFY) 2 MG tablet Take 1 tablet (2 mg total) by mouth daily. 90 tablet 1   Azelastine HCl 0.15 %  SOLN Can use one spray in each nostril two times daily if needed. 90 mL 1   clorazepate (TRANXENE) 3.75 MG tablet Take one tablet up to four times daily as needed for anxiety/panic attacks. 120 tablet 2   docusate sodium (COLACE) 50 MG capsule Take 50 mg by mouth 2 (two) times daily.     famotidine (PEPCID) 40 MG tablet TAKE 1 TABLET BY MOUTH TWICE DAILY AS DIRECTED 60 tablet 2   fluticasone (FLONASE) 50 MCG/ACT nasal spray      fluticasone-salmeterol (ADVAIR) 250-50 MCG/ACT AEPB Inhale one puff twice daily to prevent cough or wheeze. Rinse mouth after use. 60 each 5   folic acid (FOLVITE) 1 MG tablet TAKE 1 TABLET BY MOUTH DAILY 30 tablet 5   ipratropium (ATROVENT) 0.06 % nasal spray Use 2 sprays in each nostril every 6 hours to dry up nose if needed (Patient not taking: Reported on 12/25/2020) 15 mL 5   loratadine (CLARITIN) 10 MG tablet Take 1 tablet (10 mg total) by mouth daily. 90 tablet 3   montelukast (SINGULAIR) 10 MG tablet Take by mouth.     ondansetron (ZOFRAN) 4 MG tablet TAKE 1 TABLET BY MOUTH EVERY 4 HOURS AS NEEDED FOR NAUSEA 30 tablet 0   ondansetron (ZOFRAN) 4 MG tablet Take 1 tablet (4 mg total) by mouth every 4 (four) hours as needed for nausea. 90 tablet 3   potassium chloride (KLOR-CON) 10 MEQ tablet TAKE 1 TABLET BY MOUTH TWICE DAILY 60 tablet 1   prochlorperazine (COMPAZINE) 10 MG tablet TAKE 1 TABLET BY MOUTH EVERY 6 HOURS AS NEEDED FOR NAUSEA 90 tablet 1   rosuvastatin (CRESTOR) 10 MG tablet Take 10 mg by mouth. Takes twice a week  1   Semaglutide,0.25 or 0.5MG/DOS, (OZEMPIC, 0.25 OR 0.5 MG/DOSE,) 2 MG/1.5ML SOPN Inject 0.5 mg into the skin. Once weekly     SYMBICORT 160-4.5 MCG/ACT inhaler INHALE 2 PUFFS BY MOUTH TWICE DAILY TO PREVENT COUGH OR WHEEZE. RINSE, GARGLE AND SPIT AFTER  USE 10.2 g 2   tamoxifen (NOLVADEX) 20 MG tablet TAKE 1 TABLET(20 MG) BY MOUTH DAILY 90 tablet 3   topiramate (TOPAMAX) 50 MG tablet Take 1 tablet (50 mg total) by mouth daily. 90 tablet 3   UNABLE TO FIND Allergy injections for cats, grass, and dust.     valsartan-hydrochlorothiazide (DIOVAN-HCT) 320-25 MG tablet Take 1 tablet by mouth daily.  0   VIIBRYD 40 MG TABS Take 1 tablet (40 mg total) by mouth daily. 90 tablet 1   vitamin B-12 (CYANOCOBALAMIN) 500 MCG tablet Take 500 mcg by mouth daily.     No current facility-administered medications for this visit.     ASSESSMENT & PLAN:   Assessment:   1. Clinical stage IIB (TXN2M0), grade 3, ductal carcinoma with no breast primary, diagnosed in February 2021.  She received neoadjuvant chemotherapy with docetaxel/doxorubicin/cyclophosphamide completed in July.  She underwent left mastectomy and axillary dissection, with no breast primary found and 9/26 lymph nodes positive for metastasis. She completed adjuvant radiation at the end of November, and was placed on hormonal therapy with tamoxifen at the end of December.  When she undergoes bilateral salpingo oophorectomy, we could consider an aromatase inhibitor.  Baseline bone density scan was completely normal. She continues tamoxifen with complaints of nausea and fatigue.   2. Large uterine fibroid.  She is planned for a hysterectomy/BSO this year pending weight loss.  She continues to have sporadic menstrual cycles.  3. Depression and anxiety, managed  by Dr. Nyra Capes and her psychiatrist. She has been placed on Celexa per her physician.   4.  Small hard nodule in the mid mastectomy incision, stable. This is unchanged and I can palpate a second at the medial aspect of the incision, which is consistent with a small neuroma.  5. Right hip pain.There is noted a rounded calcific density lateral and anterior to the distal right femur which corresponds in location to area of abnormal uptake seen on bone  scan. Calcified soft tissue metastasis cannot be excluded. MRI was benign. She has completed physical therapy.  6. Neuropathy. We will continue to monitor. She does not wish to treat at this time as it is improving.   Plan: She will continue with tamoxifen daily. Otherwise, she will return to clinic in 4 months for reevaluation. She verbalizes understanding of and agreement to the plans discussed today. She knows to call the office should any new questions or concerns arise.    Derwood Kaplan, MD [facility] [address] [place] [address] Dept: [phone] Dept Fax: [phone]   I, Rita Ohara, am acting as scribe for Derwood Kaplan, MD  I have reviewed this report as typed by the medical scribe, and it is complete and accurate.

## 2021-04-25 DIAGNOSIS — Z6841 Body Mass Index (BMI) 40.0 and over, adult: Secondary | ICD-10-CM | POA: Diagnosis not present

## 2021-04-30 ENCOUNTER — Telehealth: Payer: Self-pay | Admitting: Oncology

## 2021-04-30 ENCOUNTER — Encounter: Payer: Self-pay | Admitting: Oncology

## 2021-04-30 ENCOUNTER — Ambulatory Visit (INDEPENDENT_AMBULATORY_CARE_PROVIDER_SITE_OTHER): Payer: BC Managed Care – PPO | Admitting: *Deleted

## 2021-04-30 ENCOUNTER — Other Ambulatory Visit: Payer: Self-pay | Admitting: Oncology

## 2021-04-30 ENCOUNTER — Inpatient Hospital Stay: Payer: BC Managed Care – PPO | Attending: Oncology

## 2021-04-30 ENCOUNTER — Other Ambulatory Visit: Payer: Self-pay | Admitting: Hematology and Oncology

## 2021-04-30 ENCOUNTER — Inpatient Hospital Stay (INDEPENDENT_AMBULATORY_CARE_PROVIDER_SITE_OTHER): Payer: BC Managed Care – PPO | Admitting: Oncology

## 2021-04-30 VITALS — BP 143/92 | HR 103 | Temp 98.9°F | Resp 18 | Ht 65.0 in | Wt 251.9 lb

## 2021-04-30 DIAGNOSIS — C773 Secondary and unspecified malignant neoplasm of axilla and upper limb lymph nodes: Secondary | ICD-10-CM

## 2021-04-30 DIAGNOSIS — Z9012 Acquired absence of left breast and nipple: Secondary | ICD-10-CM

## 2021-04-30 DIAGNOSIS — J309 Allergic rhinitis, unspecified: Secondary | ICD-10-CM

## 2021-04-30 DIAGNOSIS — D500 Iron deficiency anemia secondary to blood loss (chronic): Secondary | ICD-10-CM | POA: Diagnosis not present

## 2021-04-30 DIAGNOSIS — D649 Anemia, unspecified: Secondary | ICD-10-CM | POA: Diagnosis not present

## 2021-04-30 LAB — HEPATIC FUNCTION PANEL
ALT: 15 (ref 7–35)
AST: 20 (ref 13–35)
Alkaline Phosphatase: 98 (ref 25–125)
Bilirubin, Total: 0.2

## 2021-04-30 LAB — CBC AND DIFFERENTIAL
HCT: 42 (ref 36–46)
Hemoglobin: 13.8 (ref 12.0–16.0)
Neutrophils Absolute: 4.82
Platelets: 265 (ref 150–399)
WBC: 6.6

## 2021-04-30 LAB — COMPREHENSIVE METABOLIC PANEL
Albumin: 4 (ref 3.5–5.0)
Calcium: 9.1 (ref 8.7–10.7)

## 2021-04-30 LAB — BASIC METABOLIC PANEL
CO2: 28 — AB (ref 13–22)
Chloride: 104 (ref 99–108)
Creatinine: 1.1 (ref 0.5–1.1)
Glucose: 138
Potassium: 3.8 (ref 3.4–5.3)
Sodium: 140 (ref 137–147)

## 2021-04-30 LAB — CBC: RBC: 4.92 (ref 3.87–5.11)

## 2021-04-30 NOTE — Telephone Encounter (Signed)
Per 10/17 LOS, patient scheduled for Feb 2023 Appt's.  Gave patient Appt Summary

## 2021-05-09 DIAGNOSIS — Z6841 Body Mass Index (BMI) 40.0 and over, adult: Secondary | ICD-10-CM | POA: Diagnosis not present

## 2021-05-23 ENCOUNTER — Ambulatory Visit (INDEPENDENT_AMBULATORY_CARE_PROVIDER_SITE_OTHER): Payer: BC Managed Care – PPO | Admitting: *Deleted

## 2021-05-23 DIAGNOSIS — E1169 Type 2 diabetes mellitus with other specified complication: Secondary | ICD-10-CM | POA: Diagnosis not present

## 2021-05-23 DIAGNOSIS — J309 Allergic rhinitis, unspecified: Secondary | ICD-10-CM

## 2021-05-23 DIAGNOSIS — Z6841 Body Mass Index (BMI) 40.0 and over, adult: Secondary | ICD-10-CM | POA: Diagnosis not present

## 2021-05-23 DIAGNOSIS — E785 Hyperlipidemia, unspecified: Secondary | ICD-10-CM | POA: Diagnosis not present

## 2021-06-01 DIAGNOSIS — E1169 Type 2 diabetes mellitus with other specified complication: Secondary | ICD-10-CM | POA: Diagnosis not present

## 2021-06-01 DIAGNOSIS — E785 Hyperlipidemia, unspecified: Secondary | ICD-10-CM | POA: Diagnosis not present

## 2021-06-01 DIAGNOSIS — N1831 Chronic kidney disease, stage 3a: Secondary | ICD-10-CM | POA: Diagnosis not present

## 2021-06-12 ENCOUNTER — Ambulatory Visit (INDEPENDENT_AMBULATORY_CARE_PROVIDER_SITE_OTHER): Payer: BC Managed Care – PPO

## 2021-06-12 DIAGNOSIS — J309 Allergic rhinitis, unspecified: Secondary | ICD-10-CM

## 2021-06-12 NOTE — Progress Notes (Signed)
VIAL MADE. EXP 06-12-22

## 2021-06-13 DIAGNOSIS — J3081 Allergic rhinitis due to animal (cat) (dog) hair and dander: Secondary | ICD-10-CM | POA: Diagnosis not present

## 2021-06-14 DIAGNOSIS — J029 Acute pharyngitis, unspecified: Secondary | ICD-10-CM | POA: Diagnosis not present

## 2021-06-14 DIAGNOSIS — J020 Streptococcal pharyngitis: Secondary | ICD-10-CM | POA: Diagnosis not present

## 2021-06-14 DIAGNOSIS — Z20822 Contact with and (suspected) exposure to covid-19: Secondary | ICD-10-CM | POA: Diagnosis not present

## 2021-06-14 DIAGNOSIS — R0981 Nasal congestion: Secondary | ICD-10-CM | POA: Diagnosis not present

## 2021-06-20 DIAGNOSIS — Z6841 Body Mass Index (BMI) 40.0 and over, adult: Secondary | ICD-10-CM | POA: Diagnosis not present

## 2021-06-21 ENCOUNTER — Other Ambulatory Visit: Payer: Self-pay | Admitting: Allergy and Immunology

## 2021-06-21 ENCOUNTER — Other Ambulatory Visit: Payer: Self-pay | Admitting: Oncology

## 2021-06-21 DIAGNOSIS — E876 Hypokalemia: Secondary | ICD-10-CM

## 2021-06-27 ENCOUNTER — Ambulatory Visit (INDEPENDENT_AMBULATORY_CARE_PROVIDER_SITE_OTHER): Payer: BC Managed Care – PPO | Admitting: Allergy and Immunology

## 2021-06-27 ENCOUNTER — Encounter: Payer: Self-pay | Admitting: Allergy and Immunology

## 2021-06-27 ENCOUNTER — Other Ambulatory Visit: Payer: Self-pay

## 2021-06-27 VITALS — BP 126/82 | HR 99 | Resp 16

## 2021-06-27 DIAGNOSIS — K219 Gastro-esophageal reflux disease without esophagitis: Secondary | ICD-10-CM | POA: Diagnosis not present

## 2021-06-27 DIAGNOSIS — J4540 Moderate persistent asthma, uncomplicated: Secondary | ICD-10-CM

## 2021-06-27 DIAGNOSIS — J3089 Other allergic rhinitis: Secondary | ICD-10-CM | POA: Diagnosis not present

## 2021-06-27 NOTE — Progress Notes (Signed)
[facility] [place] [facility] [place]   Follow-up Note  Referring Provider: Marco Collie, MD Primary Provider: Marco Collie, MD Date of Office Visit: 06/27/2021  Subjective:   Laurie Simmons (DOB: [birth year]) is a 55 y.o. female who returns to the [facility] on 06/27/2021 in re-evaluation of the following:  HPI: Tonjua returns to this clinic in evaluation of asthma, allergic rhinoconjunctivitis, and reflux.  Her last visit to this clinic was 25 December 2020.  She has been doing relatively well with her asthma and allergic rhinoconjunctivitis and has not required a systemic steroid or antibiotic for any type of airway issue since her last visit.  But she contracted COVID in September 2022 and required the administration of antiviral agents and fortunately that was a minimal issue although she has had some lingering fatigue since that point.  And she contracted strep about 2 weeks ago and was treated with Augmentin.  Rarely does she use a short acting bronchodilator.  She continues to use Advair usually 1 time per day and feels as though this uses Advair is adequate.  Sometimes she gets a little bit nasal congestion at nighttime but overall she thinks that her nose is doing relatively well.  She continues on immunotherapy currently every 2 weeks without any adverse effect.  She has had some issues with her reflux.  She had a chest pain and spasm issue developed about 2 weeks ago that lasted about a day and she has also had lots more throat clearing and some mucus in her throat and some occasional burning in her throat.  She did try using Nexium for about 2 weeks and that may have actually helped.  She continues to use famotidine consistently.  She has had 3 COVID vaccines and 2 COVID infections and has received this year's flu vaccine.  Allergies as of 06/27/2021       Reactions   Effexor [venlafaxine] Palpitations   Nsaids Other (See Comments)   Pt reports  kidney disease Other reaction(s): Other (See Comments) Pt reports kidney disease Pt reports kidney disease        Medication List    albuterol 108 (90 Base) MCG/ACT inhaler Commonly known as: VENTOLIN HFA INHALE 2 PUFFS INTO THE LUNGS EVERY 4 TO 6 HOURS AS NEEDED FOR COUGH OR WHEEZING   ARIPiprazole 2 MG tablet Commonly known as: Abilify Take 1 tablet (2 mg total) by mouth daily.   Azelastine HCl 0.15 % Soln Can use one spray in each nostril two times daily if needed.   clorazepate 3.75 MG tablet Commonly known as: TRANXENE Take one tablet up to four times daily as needed for anxiety/panic attacks.   docusate sodium 50 MG capsule Commonly known as: COLACE Take 50 mg by mouth 2 (two) times daily.   famotidine 40 MG tablet Commonly known as: PEPCID TAKE 1 TABLET BY MOUTH TWICE DAILY AS DIRECTED   fluticasone 50 MCG/ACT nasal spray Commonly known as: FLONASE   fluticasone-salmeterol 250-50 MCG/ACT Aepb Commonly known as: ADVAIR Inhale one puff twice daily to prevent cough or wheeze. Rinse mouth after use.   folic acid 1 MG tablet Commonly known as: FOLVITE TAKE 1 TABLET BY MOUTH DAILY   loratadine 10 MG tablet Commonly known as: CLARITIN Take 1 tablet (10 mg total) by mouth daily.   ondansetron 4 MG tablet Commonly known as: ZOFRAN TAKE 1 TABLET BY MOUTH EVERY 4 HOURS AS NEEDED FOR NAUSEA   ondansetron 4 MG  tablet Commonly known as: ZOFRAN Take 1 tablet (4 mg total) by mouth every 4 (four) hours as needed for nausea.   Ozempic (0.25 or 0.5 MG/DOSE) 2 MG/1.5ML Sopn Generic drug: Semaglutide(0.25 or 0.5MG /DOS) Inject 0.5 mg into the skin. Once weekly   potassium chloride 10 MEQ tablet Commonly known as: KLOR-CON TAKE 1 TABLET BY MOUTH TWICE DAILY   prochlorperazine 10 MG tablet Commonly known as: COMPAZINE TAKE 1 TABLET BY MOUTH EVERY 6 HOURS AS NEEDED FOR NAUSEA   rosuvastatin 10 MG tablet Commonly known as: CRESTOR Take 10 mg by mouth. Takes twice a  week   tamoxifen 20 MG tablet Commonly known as: NOLVADEX TAKE 1 TABLET(20 MG) BY MOUTH DAILY   topiramate 50 MG tablet Commonly known as: TOPAMAX Take 1 tablet (50 mg total) by mouth daily.   UNABLE TO FIND Allergy injections for cats, grass, and dust.   valsartan-hydrochlorothiazide 320-25 MG tablet Commonly known as: DIOVAN-HCT Take 1 tablet by mouth daily.   Viibryd 40 MG Tabs Generic drug: Vilazodone HCl Take 1 tablet (40 mg total) by mouth daily.   vitamin B-12 500 MCG tablet Commonly known as: CYANOCOBALAMIN Take 500 mcg by mouth daily.    Past Medical History:  Diagnosis Date   Anxiety    Arthritis    Asthma    Breast cancer ([place]) 2021   Carpal tunnel syndrome    Carpal tunnel syndrome    bilateral   Chronic kidney disease    stage 3   Depression    Diabetes (HCC)    GERD (gastroesophageal reflux disease)    Hypertension    Irritable bowel syndrome    Severe anemia [id number]    Past Surgical History:  Procedure Laterality Date   caesarean     CHOLECYSTECTOMY     DILATION AND CURETTAGE OF UTERUS     HIP SURGERY Left    PORT A CATH INJECTION ([facility])  2021    Review of systems negative except as noted in HPI / PMHx or noted below:  Review of Systems  Constitutional: Negative.   HENT: Negative.    Eyes: Negative.   Respiratory: Negative.    Cardiovascular: Negative.   Gastrointestinal: Negative.   Genitourinary: Negative.   Musculoskeletal: Negative.   Skin: Negative.   Neurological: Negative.   Endo/Heme/Allergies: Negative.   Psychiatric/Behavioral: Negative.      Objective:   Vitals:   06/27/21 0825  BP: 126/82  Pulse: 99  Resp: 16  SpO2: 98%          Physical Exam Constitutional:      Appearance: She is not diaphoretic.  HENT:     Head: Normocephalic.     Right Ear: Tympanic membrane, ear canal and external ear normal.     Left Ear: Tympanic membrane, ear canal and external ear normal.     Nose: Nose normal. No mucosal  edema or rhinorrhea.     Mouth/Throat:     Pharynx: Uvula midline. No oropharyngeal exudate.  Eyes:     Conjunctiva/sclera: Conjunctivae normal.  Neck:     Thyroid: No thyromegaly.     Trachea: Trachea normal. No tracheal tenderness or tracheal deviation.  Cardiovascular:     Rate and Rhythm: Normal rate and regular rhythm.     Heart sounds: Normal heart sounds, S1 normal and S2 normal. No murmur heard. Pulmonary:     Effort: No respiratory distress.     Breath sounds: Normal breath sounds. No stridor. No wheezing or rales.  Lymphadenopathy:     Head:     Right side of head: No tonsillar adenopathy.     Left side of head: No tonsillar adenopathy.     Cervical: No cervical adenopathy.  Skin:    Findings: No erythema or rash.     Nails: There is no clubbing.  Neurological:     Mental Status: She is alert.    Diagnostics:    Spirometry was performed and demonstrated an FEV1 of 2.51 at 91 % of predicted.  Assessment and Plan:   1. Asthma, moderate persistent, well-controlled   2. Other allergic rhinitis   3. LPRD (laryngopharyngeal reflux disease)      1. Continue Advair 250 - 1 inhalations 1-2 times per day    2. Continue Flonase - 1 spray each nostril 3-7 times per week  3. Treat reflux with Nexium 40 mg in AM + famotidine 40 mg in PM  4. Continue immunotherapy and EpiPen  5. If needed:   A. Ventolin  B. OTC antihistamime  C. Nasal saline  D. Azelastine 1 spray each nostril 2 times per day  7. Return to clinic in 6 months or earlier if problem   Adryanna seems very stable with her atopic respiratory disease on her current plan which includes anti-inflammatory agents for airway and immunotherapy.  And her big problem at this point appears to be a flare of her LPR for which I am going to have her consistently use a combination of Nexium and famotidine.  I am not exactly sure how long she will have to use this combination but certainly a month or so and then she can go  back to using famotidine only if she is doing well.  Assuming this plan is working well for her over the course of the next 6 months I will see her back in this clinic at that point in time or earlier if there is a problem.   Allena Katz, MD Allergy / [facility]

## 2021-06-27 NOTE — Patient Instructions (Addendum)
°  1. Continue Advair 250 - 1 inhalations 1-2 times per day    2. Continue Flonase - 1 spray each nostril 3-7 times per week  3. Treat reflux with Nexium 40 mg in AM + famotidine 40 mg in PM  4. Continue immunotherapy and EpiPen  5. If needed:   A. Ventolin  B. OTC antihistamime  C. Nasal saline  D. Azelastine 1 spray each nostril 2 times per day  7. Return to clinic in 6 months or earlier if problem

## 2021-06-28 ENCOUNTER — Encounter: Payer: Self-pay | Admitting: Allergy and Immunology

## 2021-07-04 ENCOUNTER — Ambulatory Visit (INDEPENDENT_AMBULATORY_CARE_PROVIDER_SITE_OTHER): Payer: BC Managed Care – PPO | Admitting: *Deleted

## 2021-07-04 DIAGNOSIS — J309 Allergic rhinitis, unspecified: Secondary | ICD-10-CM | POA: Diagnosis not present

## 2021-07-18 ENCOUNTER — Ambulatory Visit (INDEPENDENT_AMBULATORY_CARE_PROVIDER_SITE_OTHER): Payer: BC Managed Care – PPO | Admitting: *Deleted

## 2021-07-18 DIAGNOSIS — J309 Allergic rhinitis, unspecified: Secondary | ICD-10-CM | POA: Diagnosis not present

## 2021-07-19 ENCOUNTER — Other Ambulatory Visit: Payer: Self-pay

## 2021-07-19 ENCOUNTER — Ambulatory Visit (INDEPENDENT_AMBULATORY_CARE_PROVIDER_SITE_OTHER): Payer: 59 | Admitting: Adult Health

## 2021-07-19 ENCOUNTER — Encounter: Payer: Self-pay | Admitting: Adult Health

## 2021-07-19 DIAGNOSIS — F331 Major depressive disorder, recurrent, moderate: Secondary | ICD-10-CM | POA: Diagnosis not present

## 2021-07-19 DIAGNOSIS — F410 Panic disorder [episodic paroxysmal anxiety] without agoraphobia: Secondary | ICD-10-CM | POA: Diagnosis not present

## 2021-07-19 DIAGNOSIS — F411 Generalized anxiety disorder: Secondary | ICD-10-CM | POA: Diagnosis not present

## 2021-07-19 DIAGNOSIS — F39 Unspecified mood [affective] disorder: Secondary | ICD-10-CM

## 2021-07-19 MED ORDER — CLORAZEPATE DIPOTASSIUM 3.75 MG PO TABS: ORAL_TABLET | ORAL | 2 refills | Status: DC

## 2021-07-19 MED ORDER — VIIBRYD 40 MG PO TABS: 40.0000 mg | ORAL_TABLET | Freq: Every day | ORAL | 3 refills | Status: DC

## 2021-07-19 MED ORDER — TOPIRAMATE 50 MG PO TABS: 50.0000 mg | ORAL_TABLET | Freq: Every day | ORAL | 3 refills | Status: DC

## 2021-07-19 MED ORDER — ARIPIPRAZOLE 2 MG PO TABS: 2.0000 mg | ORAL_TABLET | Freq: Every day | ORAL | 3 refills | Status: DC

## 2021-07-19 NOTE — Progress Notes (Signed)
[name] [id number] 01/29/1966 56 y.o.  Subjective:   Patient ID:  Laurie Simmons is a 56 y.o. (DOB Nov 21, 1965) female.  Chief Complaint: No chief complaint on file.   HPI Laurie Simmons presents to the office today for follow-up of anxiety, panic attacks, depression, and mood disorder.  Describes mood today as "ok". Pleasant. Tearful at times. Mood symptoms - reports depression and anxiety. Feels irritable at times. Reports "mini" panic attacks. Reports mood swings - more low periods. Reports some struggles over the holidays. Has lost a lot of family members. Frustrated about weight gain. Recovering from Covid and Strep throat. Stating "I don't fel like doing much". Family doing well. Feels like current medications are working well for her. Stable interest and motivation. Taking medications as prescribed. Energy levels varies. Active, does not have a regular exercise routine with current physical disabilities. Walking most days.Has finished with P/T. Enjoys some usual interests and activities. Married. Lives with husband and their two children - son 76 and daughter 32 - home for the summer - [employer]. Mostly staying home. Appetite adequate. Weight gain - 245 to 247 pounds. Working with weight loss management - Ozempic. Sleeps better some nights than others. Averages 7 hours of broken sleep.  Focus and concentration difficulties - "I can't focus or remember things". Would like to read but can't focus on that. Completing tasks. Managing some aspects of household - able to do more some days than others. Currently disabled and receiving SSI benefits. Denies SI or HI.  Denies AH or VH.   Review of Systems:  Review of Systems  Musculoskeletal:  Negative for gait problem.  Neurological:  Negative for tremors.  Psychiatric/Behavioral:         Please refer to HPI   Medications: I have reviewed the patient's current medications.  Current Outpatient Medications  Medication Sig Dispense Refill   albuterol  (VENTOLIN HFA) 108 (90 Base) MCG/ACT inhaler INHALE 2 PUFFS INTO THE LUNGS EVERY 4 TO 6 HOURS AS NEEDED FOR COUGH OR WHEEZING 18 g 1   ARIPiprazole (ABILIFY) 2 MG tablet Take 1 tablet (2 mg total) by mouth daily. 90 tablet 1   Azelastine HCl 0.15 % SOLN Can use one spray in each nostril two times daily if needed. 90 mL 1   clorazepate (TRANXENE) 3.75 MG tablet Take one tablet up to four times daily as needed for anxiety/panic attacks. 120 tablet 2   docusate sodium (COLACE) 50 MG capsule Take 50 mg by mouth 2 (two) times daily.     famotidine (PEPCID) 40 MG tablet TAKE 1 TABLET BY MOUTH TWICE DAILY AS DIRECTED 60 tablet 5   fluticasone (FLONASE) 50 MCG/ACT nasal spray      fluticasone-salmeterol (ADVAIR) 250-50 MCG/ACT AEPB Inhale one puff twice daily to prevent cough or wheeze. Rinse mouth after use. 60 each 5   folic acid (FOLVITE) 1 MG tablet TAKE 1 TABLET BY MOUTH DAILY 30 tablet 5   loratadine (CLARITIN) 10 MG tablet Take 1 tablet (10 mg total) by mouth daily. 90 tablet 3   ondansetron (ZOFRAN) 4 MG tablet TAKE 1 TABLET BY MOUTH EVERY 4 HOURS AS NEEDED FOR NAUSEA 30 tablet 0   ondansetron (ZOFRAN) 4 MG tablet Take 1 tablet (4 mg total) by mouth every 4 (four) hours as needed for nausea. 90 tablet 3   potassium chloride (KLOR-CON) 10 MEQ tablet TAKE 1 TABLET BY MOUTH TWICE DAILY 60 tablet 1   prochlorperazine (COMPAZINE) 10 MG tablet TAKE 1  TABLET BY MOUTH EVERY 6 HOURS AS NEEDED FOR NAUSEA 90 tablet 1   rosuvastatin (CRESTOR) 10 MG tablet Take 10 mg by mouth. Takes twice a week  1   Semaglutide,0.25 or 0.5MG/DOS, (OZEMPIC, 0.25 OR 0.5 MG/DOSE,) 2 MG/1.5ML SOPN Inject 0.5 mg into the skin. Once weekly     tamoxifen (NOLVADEX) 20 MG tablet TAKE 1 TABLET(20 MG) BY MOUTH DAILY 90 tablet 3   topiramate (TOPAMAX) 50 MG tablet Take 1 tablet (50 mg total) by mouth daily. 90 tablet 3   UNABLE TO FIND Allergy injections for cats, grass, and dust.     valsartan-hydrochlorothiazide (DIOVAN-HCT) 320-25 MG  tablet Take 1 tablet by mouth daily.  0   VIIBRYD 40 MG TABS Take 1 tablet (40 mg total) by mouth daily. 90 tablet 1   vitamin B-12 (CYANOCOBALAMIN) 500 MCG tablet Take 500 mcg by mouth daily.     No current facility-administered medications for this visit.    Medication Side Effects: None  Allergies:  Allergies  Allergen Reactions   Effexor [Venlafaxine] Palpitations   Nsaids Other (See Comments)    Pt reports kidney disease Other reaction(s): Other (See Comments) Pt reports kidney disease Pt reports kidney disease    Past Medical History:  Diagnosis Date   Anxiety    Arthritis    Asthma    Breast cancer ([place]) 2021   Carpal tunnel syndrome    Carpal tunnel syndrome    bilateral   Chronic kidney disease    stage 3   Depression    Diabetes (HCC)    GERD (gastroesophageal reflux disease)    Hypertension    Irritable bowel syndrome    Severe anemia [id number]    Past Medical History, Surgical history, Social history, and Family history were reviewed and updated as appropriate.   Please see review of systems for further details on the patient's review from today.   Objective:   Physical Exam:  There were no vitals taken for this visit.  Physical Exam Constitutional:      General: She is not in acute distress. Musculoskeletal:        General: No deformity.  Neurological:     Mental Status: She is alert and oriented to person, place, and time.     Coordination: Coordination normal.  Psychiatric:        Attention and Perception: Attention and perception normal. She does not perceive auditory or visual hallucinations.        Mood and Affect: Mood normal. Mood is not anxious or depressed. Affect is not labile, blunt, angry or inappropriate.        Speech: Speech normal.        Behavior: Behavior normal.        Thought Content: Thought content normal. Thought content is not paranoid or delusional. Thought content does not include homicidal or suicidal ideation. Thought  content does not include homicidal or suicidal plan.        Cognition and Memory: Cognition and memory normal.        Judgment: Judgment normal.     Comments: Insight intact    Lab Review:     Component Value Date/Time   NA 140 04/30/2021 0000   K 3.8 04/30/2021 0000   CL 104 04/30/2021 0000   CO2 28 (A) 04/30/2021 0000   GLUCOSE 97 10/21/2008 0545   BUN 20 02/27/2021 0000   CREATININE 1.1 04/30/2021 0000   CREATININE 0.76 10/20/2008 2105   CALCIUM 9.1 04/30/2021 0000  PROT 6.0 10/20/2008 2105   ALBUMIN 4.0 04/30/2021 0000   AST 20 04/30/2021 0000   ALT 15 04/30/2021 0000   ALKPHOS 98 04/30/2021 0000   BILITOT 0.2 (L) 10/20/2008 2105   GFRNONAA >60 10/20/2008 2105   GFRAA  10/20/2008 2105    >60        The eGFR has been calculated using the MDRD equation. This calculation has not been validated in all clinical situations. eGFR's persistently <60 mL/min signify possible Chronic Kidney Disease.       Component Value Date/Time   WBC 6.6 04/30/2021 0000   WBC 12.3 (H) 10/22/2008 0530   RBC 4.92 04/30/2021 0000   HGB 13.8 04/30/2021 0000   HGB 11.6 12/23/2018 1146   HCT 42 04/30/2021 0000   HCT 36.4 12/23/2018 1146   PLT 265 04/30/2021 0000   MCV 87 11/07/2020 0000   MCH 22.3 (L) 12/23/2018 1146   MCHC 31.9 12/23/2018 1146   MCHC 33.7 10/22/2008 0530   RDW 16.9 (H) 12/23/2018 1146   LYMPHSABS 2.8 12/23/2018 1146   EOSABS 0.2 12/23/2018 1146   BASOSABS 0.1 12/23/2018 1146    No results found for: POCLITH, LITHIUM   No results found for: PHENYTOIN, PHENOBARB, VALPROATE, CBMZ   .res Assessment: Plan:    Plan:  1. Viibryd 68m daily 2. Tranxene 3.720mup to 4 times daily 3. Topamax 50 mg daily 4. Abilify 40m31maily  Set up with therapist  RTC 3 months  Patient advised to contact office with any questions, adverse effects, or acute worsening in signs and symptoms.  Discussed potential benefits, risk, and side effects of benzodiazepines to include  potential risk of tolerance and dependence, as well as possible drowsiness. Advised patient not to drive if experiencing drowsiness and to take lowest possible effective dose to minimize risk of dependence and tolerance.  Discussed potential metabolic side effects associated with atypical antipsychotics, as well as potential risk for movement side effects. Advised pt to contact office if movement side effects occur.    Diagnoses and all orders for this visit:  Generalized anxiety disorder  Major depressive disorder, recurrent episode, moderate (HCC)  Panic attacks  Episodic mood disorder ([facility]   Please see After Visit Summary for patient specific instructions.  Future Appointments  Date Time [place]/16/2023 10:30 AM ParMelodye PedP [facility] None  12/26/2021  8:30 AM Kozlow, EriDonnamarie PoagD [facility] None    No orders of the defined types were placed in this encounter.   -------------------------------

## 2021-07-20 ENCOUNTER — Other Ambulatory Visit: Payer: Self-pay | Admitting: Oncology

## 2021-07-20 ENCOUNTER — Other Ambulatory Visit: Payer: Self-pay | Admitting: Allergy and Immunology

## 2021-08-01 ENCOUNTER — Ambulatory Visit (INDEPENDENT_AMBULATORY_CARE_PROVIDER_SITE_OTHER): Payer: BC Managed Care – PPO | Admitting: *Deleted

## 2021-08-01 DIAGNOSIS — J309 Allergic rhinitis, unspecified: Secondary | ICD-10-CM

## 2021-08-08 DIAGNOSIS — Z20822 Contact with and (suspected) exposure to covid-19: Secondary | ICD-10-CM | POA: Diagnosis not present

## 2021-08-08 DIAGNOSIS — R519 Headache, unspecified: Secondary | ICD-10-CM | POA: Diagnosis not present

## 2021-08-08 DIAGNOSIS — J069 Acute upper respiratory infection, unspecified: Secondary | ICD-10-CM | POA: Diagnosis not present

## 2021-08-09 ENCOUNTER — Ambulatory Visit (INDEPENDENT_AMBULATORY_CARE_PROVIDER_SITE_OTHER): Payer: 59 | Admitting: Psychiatry

## 2021-08-09 ENCOUNTER — Other Ambulatory Visit: Payer: Self-pay

## 2021-08-09 DIAGNOSIS — F411 Generalized anxiety disorder: Secondary | ICD-10-CM

## 2021-08-09 NOTE — Progress Notes (Signed)
[facility] Counsel[facility] Initial Adult Exam  Name: Laurie Simmons Date: 08/09/2021 MRN: [id number] DOB: 1965/10/18 PCP: Marco Collie, MD  Time spent: 58 minutes  Guardian/Payee:  patient   Paperwork requested:  No   Reason for Visit /Presenting Problem: anxiety, depression, frustration, overthinking  Mental Status Exam:    Appearance:   Casual     Behavior:  Appropriate, Sharing, and Motivated  Motor:  Normal  Speech/Language:   Clear and Coherent  Affect:  Anxious, some depression  Mood:  anxious and depressed  Thought process:  goal directed  Thought content:    overthinking  Sensory/Perceptual disturbances:    WNL  Orientation:  oriented to person, place, time/date, situation, day of week, month of year, year, and stated date of Jan. 26, 2023  Attention:  Fair  Concentration:  Fair  Memory:  Some related to "chemo brain fog"  Fund of knowledge:   Good  Insight:    Fair  Judgment:   Good  Impulse Control:  Good   Reported Symptoms:  see above symptoms  Risk Assessment: Danger to Self:  No Self-injurious Behavior: No Danger to Others: No Duty to Warn:no Physical Aggression / Violence:No  Access to Firearms a concern: No  Gang Involvement:No  Patient / guardian was educated about steps to take if suicide or homicide risk level increases between visits: Denies any SI or HI. While future psychiatric events cannot be accurately predicted, the patient does not currently require acute inpatient psychiatric care and does not currently meet [facility] involuntary commitment criteria.  Substance Abuse History: Current substance abuse: No     Past Psychiatric History:   Previous psychological history is significant for anxiety and depression Outpatient Providers:most recently "[facility]" History of Psych Hospitalization: No  Psychological Testing:  n/a    Abuse History: Victim of No.,  none    Report needed: No. Victim of Neglect:No. Perpetrator of  n/a   Witness /  Exposure to Domestic Violence: No   Protective Services Involvement: No  Witness to [employer] Violence:  No   Family History: Patient confirms info below. Family History  Problem Relation Age of Onset   Pulmonary fibrosis Father    Cancer Maternal Grandmother        carcinoma of the vulva and possible ovarian cancer   Prostate cancer Maternal Grandfather    Lung cancer Paternal Grandfather        heavy smoker    Living situation: the patient lives with their spouse of 5 yrs, 40 yr old son, and 40 yr old daughter when she's home from college at [employer].   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, mining: No   Educational History: Education: Scientist, product/process development:   Protestant, and attends [employer] "my home church"  Any cultural differences that may affect / interfere with treatment:  not applicable   Recreation/Hobbies: crochet, scrapbooking but have been unmotivated on my hobbies recently  Stressors:Health problems   Other: lost 2 aunts and uncle    Strengths:  Supportive Relationships, Family, Church, Spirituality, Hopefulness, and Able to Communicate Effectively  Barriers:  "I think too much." "Every little thing reminds me of things  that trigger my anxiety."    Legal History: Pending legal issue / charges: The patient has no significant history of legal issues. History of legal issue / charges:  none  Medical History/Surgical History:reviewed and confirmed info below. Past Medical History:  Diagnosis Date   Anxiety    Arthritis    Asthma    Breast cancer ([place]) 2021   Carpal tunnel syndrome    Carpal tunnel syndrome     bilateral   Chronic kidney disease    stage 3   Depression    Diabetes (HCC)    GERD (gastroesophageal reflux disease)    Hypertension    Irritable bowel syndrome    Severe anemia [id number]    Past Surgical History:  Procedure Laterality Date   caesarean     CHOLECYSTECTOMY     DILATION AND CURETTAGE OF UTERUS     HIP SURGERY Left    PORT A CATH INJECTION ([place] [facility])  2021    Medications:Patient confirms info below. Current Outpatient Medications  Medication Sig Dispense Refill   albuterol (VENTOLIN HFA) 108 (90 Base) MCG/ACT inhaler INHALE 2 PUFFS INTO THE LUNGS EVERY 4 TO 6 HOURS AS NEEDED FOR COUGH OR WHEEZING 18 g 1   ARIPiprazole (ABILIFY) 2 MG tablet Take 1 tablet (2 mg total) by mouth daily. 90 tablet 3   Azelastine HCl 0.15 % SOLN Can use one spray in each nostril two times daily if needed. 90 mL 1   clorazepate (TRANXENE) 3.75 MG tablet Take one tablet up to four times daily as needed for anxiety/panic attacks. 120 tablet 2   docusate sodium (COLACE) 50 MG capsule Take 50 mg by mouth 2 (two) times daily.     famotidine (PEPCID) 40 MG tablet TAKE 1 TABLET BY MOUTH TWICE DAILY AS DIRECTED 60 tablet 5   fluticasone (FLONASE) 50 MCG/ACT nasal spray      fluticasone-salmeterol (ADVAIR DISKUS) 250-50 MCG/ACT AEPB INHALE 1 PUFF BY MOUTH TWICE DAILY TO PREVENT COUGH OR WHEEZE. RINSE MOUTH AFTER USE 60 each 4   folic acid (FOLVITE) 1 MG tablet TAKE 1 TABLET BY MOUTH DAILY 30 tablet 5   loratadine (CLARITIN) 10 MG tablet Take 1 tablet (10 mg total) by mouth daily. 90 tablet 3   ondansetron (ZOFRAN) 4 MG tablet TAKE 1 TABLET BY MOUTH EVERY 4 HOURS AS NEEDED FOR NAUSEA 30 tablet 0   ondansetron (ZOFRAN) 4 MG tablet Take 1 tablet (4 mg total) by mouth every 4 (four) hours as needed for nausea. 90 tablet 3   potassium chloride (KLOR-CON) 10 MEQ tablet TAKE 1 TABLET BY MOUTH TWICE DAILY 60 tablet 1   prochlorperazine (COMPAZINE) 10 MG tablet TAKE 1 TABLET BY MOUTH EVERY 6 HOURS AS NEEDED FOR  NAUSEA 90 tablet 1   rosuvastatin (CRESTOR) 10 MG tablet Take 10 mg by mouth. Takes twice a week  1   Semaglutide,0.25 or 0.5MG /DOS, (OZEMPIC, 0.25 OR 0.5 MG/DOSE,) 2 MG/1.5ML SOPN Inject 0.5 mg into the skin. Once weekly     tamoxifen (NOLVADEX) 20 MG tablet TAKE 1 TABLET(20 MG) BY MOUTH DAILY 90 tablet 3   topiramate (TOPAMAX) 50 MG tablet Take 1 tablet (50 mg total) by mouth daily. 90 tablet 3   UNABLE TO FIND Allergy injections for cats, grass, and dust.     valsartan-hydrochlorothiazide (DIOVAN-HCT) 320-25 MG tablet Take 1 tablet by mouth daily.  0   VIIBRYD 40 MG TABS Take 1 tablet (40 mg total) by mouth daily. 90 tablet 3  vitamin B-12 (CYANOCOBALAMIN) 500 MCG tablet Take 500 mcg by mouth daily.     No current facility-administered medications for this visit.    Allergies  Allergen Reactions   Effexor [Venlafaxine] Palpitations   Nsaids Other (See Comments)    Pt reports kidney disease Other reaction(s): Other (See Comments) Pt reports kidney disease Pt reports kidney disease    Diagnoses:    ICD-10-CM   1. Generalized anxiety disorder  F41.1      Treatment goal plan: Patient not signing treatment plan on computer screen due to COVID.  Treatment goals remain on treatment plan as patient works with strategies to try and achieve her goals.  Progress is assessed each session and noted in the "subject" and/or "plan" section of treatment note. Long-term goal: Develop healthy cognitive patterns and beliefs about self and the world that lead to alleviation and help prevent the relapse of depressive symptoms Short-term goal: Replace negative self-defeating self-talk with verbalization of realistic and positive cognitive messages. Strategies: Reinforce positive, reality-based cognitive messages that enhance self-confidence and increase adaptive action.   Plan of Care:   This is patient's first appointment with this therapist and today we completed her initial evaluation and  initial treatment goal plan collaboratively.  Patient is a 56 year old married for 31 yrs to her husband and we "have a pretty good relationship but doesn't help much around the house." Patient "is an only child", father is deceased and mother is struggling with some dementia but lives by herself. 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 Close to family.  In her mental status exam, she appears casual with appropriate and somewhat motivated behavior, motor skills normal, speech and language clear and coherent, affect and mood are anxious with some depression, thought process is goal directed, she is well oriented to person/place/time/date/situation/day of week/month of year/year and stated date of August 09, 2021.  Attention and concentration are fair, memory she reports is "off some due to chemo brain fog", insight is fair and sometimes better, judgment and impulse control are both good.  She denies any thoughts at all of any harm to herself or anyone else.  Denies any current substance abuse.  Reports few friends but is close to some family members.  She reports her strengths as being supportive relationships, family, church, spirituality, hopefulness, and able to communicate effectively.  She admits that her barriers to treatment may be "I think too much" and also "every little thing reminds me of things that trigger my anxiety."  Does have some extensive medical history as noted in the medical sections above in this initial evaluation.  She is on Social Security disability.  Husband is employed full-time. Reports last receiving therapy from [facility] but didn't feel it was helpful. Neither parent she reports had any mental health concerns. Patient feeling she needs to work mostly on "not having  that nervous feeling all the time", wants to enjoy life without reliving the times I was working.  Patient seems motivated for working with her treatment goals and is going to be scheduled to return within 2 weeks.  Review of initial treatment goal plan and patient is in agreement.  Next appointment within 2 weeks.  This record has been created using [employer].  Chart creation errors have been sought, but may not  always have been located and corrected.  Such creation errors do not reflect on the standard of medical care provided.   Shanon Ace, LCSW

## 2021-08-15 ENCOUNTER — Ambulatory Visit (INDEPENDENT_AMBULATORY_CARE_PROVIDER_SITE_OTHER): Payer: BC Managed Care – PPO | Admitting: *Deleted

## 2021-08-15 DIAGNOSIS — J309 Allergic rhinitis, unspecified: Secondary | ICD-10-CM

## 2021-08-16 ENCOUNTER — Other Ambulatory Visit: Payer: Self-pay | Admitting: Oncology

## 2021-08-16 DIAGNOSIS — E876 Hypokalemia: Secondary | ICD-10-CM

## 2021-08-23 ENCOUNTER — Telehealth: Payer: Self-pay | Admitting: Adult Health

## 2021-08-23 NOTE — Telephone Encounter (Signed)
Pt LVM stating that this past week,  her anxiety has gotten worse.  She would like Gina to call her back

## 2021-08-23 NOTE — Telephone Encounter (Signed)
She really needs to get set up to work with a therapist. I don't feel like she is able to work at this time. A review is normal.

## 2021-08-23 NOTE — Telephone Encounter (Signed)
Pt stated she received a letter stating that her disability was up for review.This made her have a panic attack and she is very nervous of the idea of gong back to work.She has not been able to calm down since receiving the letter.She is very nervous and can't sleep.She has taken 3 clorazepate this morning already and wants to know can any meds be adjusted to help with increased anxiety at the time.

## 2021-08-23 NOTE — Telephone Encounter (Signed)
Pt informed

## 2021-08-28 DIAGNOSIS — C779 Secondary and unspecified malignant neoplasm of lymph node, unspecified: Secondary | ICD-10-CM | POA: Diagnosis not present

## 2021-08-28 DIAGNOSIS — F410 Panic disorder [episodic paroxysmal anxiety] without agoraphobia: Secondary | ICD-10-CM | POA: Diagnosis not present

## 2021-08-28 DIAGNOSIS — E1169 Type 2 diabetes mellitus with other specified complication: Secondary | ICD-10-CM | POA: Diagnosis not present

## 2021-08-28 DIAGNOSIS — F411 Generalized anxiety disorder: Secondary | ICD-10-CM | POA: Diagnosis not present

## 2021-08-28 DIAGNOSIS — M7050 Other bursitis of knee, unspecified knee: Secondary | ICD-10-CM | POA: Diagnosis not present

## 2021-08-29 ENCOUNTER — Ambulatory Visit (INDEPENDENT_AMBULATORY_CARE_PROVIDER_SITE_OTHER): Payer: BC Managed Care – PPO | Admitting: *Deleted

## 2021-08-29 DIAGNOSIS — J309 Allergic rhinitis, unspecified: Secondary | ICD-10-CM | POA: Diagnosis not present

## 2021-08-30 ENCOUNTER — Other Ambulatory Visit: Payer: Self-pay

## 2021-08-30 ENCOUNTER — Encounter: Payer: Self-pay | Admitting: Hematology and Oncology

## 2021-08-30 ENCOUNTER — Inpatient Hospital Stay: Payer: BC Managed Care – PPO

## 2021-08-30 ENCOUNTER — Inpatient Hospital Stay: Payer: BC Managed Care – PPO | Attending: Hematology and Oncology | Admitting: Hematology and Oncology

## 2021-08-30 VITALS — BP 134/92 | HR 94 | Temp 99.1°F | Resp 18 | Ht 65.0 in | Wt 248.1 lb

## 2021-08-30 DIAGNOSIS — Z170 Estrogen receptor positive status [ER+]: Secondary | ICD-10-CM | POA: Insufficient documentation

## 2021-08-30 DIAGNOSIS — C773 Secondary and unspecified malignant neoplasm of axilla and upper limb lymph nodes: Secondary | ICD-10-CM | POA: Insufficient documentation

## 2021-08-30 DIAGNOSIS — F419 Anxiety disorder, unspecified: Secondary | ICD-10-CM

## 2021-08-30 DIAGNOSIS — F32A Depression, unspecified: Secondary | ICD-10-CM

## 2021-08-30 DIAGNOSIS — Z79810 Long term (current) use of selective estrogen receptor modulators (SERMs): Secondary | ICD-10-CM | POA: Diagnosis not present

## 2021-08-30 DIAGNOSIS — R5383 Other fatigue: Secondary | ICD-10-CM | POA: Insufficient documentation

## 2021-08-30 DIAGNOSIS — Z79899 Other long term (current) drug therapy: Secondary | ICD-10-CM | POA: Insufficient documentation

## 2021-08-30 DIAGNOSIS — Z9012 Acquired absence of left breast and nipple: Secondary | ICD-10-CM | POA: Diagnosis not present

## 2021-08-30 DIAGNOSIS — R110 Nausea: Secondary | ICD-10-CM | POA: Insufficient documentation

## 2021-08-30 DIAGNOSIS — C801 Malignant (primary) neoplasm, unspecified: Secondary | ICD-10-CM | POA: Diagnosis not present

## 2021-08-30 HISTORY — DX: Depression, unspecified: F32.A

## 2021-08-30 HISTORY — DX: Anxiety disorder, unspecified: F41.9

## 2021-08-30 HISTORY — DX: Other fatigue: R53.83

## 2021-08-30 LAB — CBC AND DIFFERENTIAL
HCT: 42 (ref 36–46)
Hemoglobin: 14.1 (ref 12.0–16.0)
Neutrophils Absolute: 5.03
Platelets: 296 (ref 150–399)
WBC: 7.4

## 2021-08-30 LAB — BASIC METABOLIC PANEL
BUN: 23 — AB (ref 4–21)
CO2: 25 — AB (ref 13–22)
Chloride: 104 (ref 99–108)
Creatinine: 1.3 — AB (ref 0.5–1.1)
Glucose: 103
Potassium: 3.5 (ref 3.4–5.3)
Sodium: 138 (ref 137–147)

## 2021-08-30 LAB — CBC: RBC: 4.92 (ref 3.87–5.11)

## 2021-08-30 LAB — COMPREHENSIVE METABOLIC PANEL
Albumin: 4.1 (ref 3.5–5.0)
Calcium: 9.1 (ref 8.7–10.7)

## 2021-08-30 LAB — HEPATIC FUNCTION PANEL
ALT: 17 (ref 7–35)
AST: 20 (ref 13–35)
Alkaline Phosphatase: 94 (ref 25–125)
Bilirubin, Total: 0.3

## 2021-08-30 LAB — IRON AND TIBC
Iron: 58 ug/dL (ref 28–170)
Saturation Ratios: 14 % (ref 10.4–31.8)
TIBC: 418 ug/dL (ref 250–450)
UIBC: 360 ug/dL

## 2021-08-30 LAB — FERRITIN: Ferritin: 70 ng/mL (ref 11–307)

## 2021-08-30 NOTE — Assessment & Plan Note (Addendum)
Clinical stage IIB (TXN2M0), grade 3, ductal carcinoma with no breast primary, diagnosed in February 2021.  She received neoadjuvant chemotherapy with docetaxel/doxorubicin/cyclophosphamide completed in July.  She underwent left mastectomy and axillary dissection, with no breast primary found and 9/26 lymph nodes positive for metastasis. She completed adjuvant radiation at the end of November, and was placed on hormonal therapy with tamoxifen at the end of December.  When she undergoes bilateral salpingo oophorectomy, we could consider an aromatase inhibitor.  Baseline bone density scan was completely normal. She continues tamoxifen with complaints of nausea and fatigue. She does complain of an area to her left mastectomy site that is very worrisome for her. I will order ultrasound of the site and she will return to clinic in 2 weeks for follow up with Dr. Hinton Rao.

## 2021-08-30 NOTE — Progress Notes (Signed)
Patient Care Team: Marco Collie, MD as PCP - General (Family Medicine) [name], MD as Obstetrician (Obstetrics and Gynecology) [name], MD as Referring Physician (Surgery) Derwood Kaplan, MD as Consulting Physician (Oncology) Gatha Mayer, MD as Consulting Physician (Radiation Oncology)  Clinic Day:  08/30/2021  Referring physician: Marco Collie, MD  ASSESSMENT & PLAN:   Assessment & Plan: Secondary malignant neoplasm of axillary lymph nodes [facility]) Clinical stage IIB (TXN2M0), grade 3, ductal carcinoma with no breast primary, diagnosed in February 2021.  She received neoadjuvant chemotherapy with docetaxel/doxorubicin/cyclophosphamide completed in July.  She underwent left mastectomy and axillary dissection, with no breast primary found and 9/26 lymph nodes positive for metastasis. She completed adjuvant radiation at the end of November, and was placed on hormonal therapy with tamoxifen at the end of December.  When she undergoes bilateral salpingo oophorectomy, we could consider an aromatase inhibitor.  Baseline bone density scan was completely normal. She continues tamoxifen with complaints of nausea and fatigue. She does complain of an area to her left mastectomy site that is very worrisome for her. I will order ultrasound of the site and she will return to clinic in 2 weeks for follow up with Dr. Hinton Rao.  Fatigue She has ongoing fatigue, but states it has worsened over the last couple of months. She has also had an increase in her anxiety and depression and thinks this might be exacerbating her fatigue. I will check iron studies today as well.   Anxiety and depression She is being managed by her PCP for anxiety and depression. She states this has increased significantly over the last couple of months. She is in touch with her PCP who would like for her to try therapy before she makes any adjustments to her medications and she is currently undergoing sessions  with a therapist. She describes outside stressors including anxiety over her disability case being under review and different family issues. She is hoping that once these many issues are resolved, her anxiety will improve. She is noticeably more stressed today.   The patient understands the plans discussed today and is in agreement with them.  She knows to contact our office if she develops concerns prior to her next appointment.   Melodye Ped, NP  [facility] AT [facility] [address] [place] [address] Dept: [phone] Dept Fax: [phone]   Orders Placed This Encounter  Procedures   MM Digital Diagnostic Unilat L    Standing Status:   Future    Standing Expiration Date:   08/30/2022    Order Specific Question:   Reason for exam:    Answer:   New right lump    Order Specific Question:   Preferred imaging location?    Answer:   External   CBC w Diff ([facility] scanned report) STAT    Standing Status:   Future    Number of Occurrences:   1    Standing Expiration Date:   08/30/2022   CMP ([facility] scanned report) STAT    Standing Status:   Future    Number of Occurrences:   1    Standing Expiration Date:   08/30/2022   Ferritin    Standing Status:   Future    Number of Occurrences:   1    Standing Expiration Date:   08/30/2022   Iron and TIBC    Standing Status:   Future    Number of Occurrences:  1    Standing Expiration Date:   08/30/2022      CHIEF COMPLAINT:  CC: A 56 year old female with history of breast cancer here for 3 month evaluation.  Current Treatment:  Tamoxifen  INTERVAL HISTORY:  Laurie Simmons is here today for repeat clinical assessment. She denies fevers or chills. She denies pain. Her appetite is good. Her weight has decreased 3 pounds over last 3 months .  I have reviewed the past medical history, past surgical history, social history and family history with the patient and they are unchanged  from previous note.  ALLERGIES:  is allergic to effexor [venlafaxine] and nsaids.  MEDICATIONS:  Current Outpatient Medications  Medication Sig Dispense Refill   albuterol (VENTOLIN HFA) 108 (90 Base) MCG/ACT inhaler INHALE 2 PUFFS INTO THE LUNGS EVERY 4 TO 6 HOURS AS NEEDED FOR COUGH OR WHEEZING 18 g 1   ARIPiprazole (ABILIFY) 2 MG tablet Take 1 tablet (2 mg total) by mouth daily. 90 tablet 3   Azelastine HCl 0.15 % SOLN Can use one spray in each nostril two times daily if needed. 90 mL 1   clorazepate (TRANXENE) 3.75 MG tablet Take one tablet up to four times daily as needed for anxiety/panic attacks. 120 tablet 2   docusate sodium (COLACE) 50 MG capsule Take 50 mg by mouth 2 (two) times daily.     famotidine (PEPCID) 40 MG tablet TAKE 1 TABLET BY MOUTH TWICE DAILY AS DIRECTED 60 tablet 5   fluticasone (FLONASE) 50 MCG/ACT nasal spray      fluticasone-salmeterol (ADVAIR DISKUS) 250-50 MCG/ACT AEPB INHALE 1 PUFF BY MOUTH TWICE DAILY TO PREVENT COUGH OR WHEEZE. RINSE MOUTH AFTER USE 60 each 4   folic acid (FOLVITE) 1 MG tablet TAKE 1 TABLET BY MOUTH DAILY 30 tablet 5   loratadine (CLARITIN) 10 MG tablet Take 1 tablet (10 mg total) by mouth daily. 90 tablet 3   ondansetron (ZOFRAN) 4 MG tablet TAKE 1 TABLET BY MOUTH EVERY 4 HOURS AS NEEDED FOR NAUSEA 30 tablet 0   ondansetron (ZOFRAN) 4 MG tablet Take 1 tablet (4 mg total) by mouth every 4 (four) hours as needed for nausea. 90 tablet 3   potassium chloride (KLOR-CON) 10 MEQ tablet TAKE 1 TABLET BY MOUTH TWICE DAILY 60 tablet 1   prochlorperazine (COMPAZINE) 10 MG tablet TAKE 1 TABLET BY MOUTH EVERY 6 HOURS AS NEEDED FOR NAUSEA 90 tablet 1   rosuvastatin (CRESTOR) 10 MG tablet Take 10 mg by mouth. Takes twice a week  1   Semaglutide,0.25 or 0.5MG/DOS, (OZEMPIC, 0.25 OR 0.5 MG/DOSE,) 2 MG/1.5ML SOPN Inject 0.5 mg into the skin. Once weekly     tamoxifen (NOLVADEX) 20 MG tablet TAKE 1 TABLET(20 MG) BY MOUTH DAILY 90 tablet 3   topiramate  (TOPAMAX) 50 MG tablet Take 1 tablet (50 mg total) by mouth daily. 90 tablet 3   UNABLE TO FIND Allergy injections for cats, grass, and dust.     valsartan-hydrochlorothiazide (DIOVAN-HCT) 320-25 MG tablet Take 1 tablet by mouth daily.  0   VIIBRYD 40 MG TABS Take 1 tablet (40 mg total) by mouth daily. 90 tablet 3   vitamin B-12 (CYANOCOBALAMIN) 500 MCG tablet Take 500 mcg by mouth daily.     No current facility-administered medications for this visit.    HISTORY OF PRESENT ILLNESS:   Oncology History  Secondary malignant neoplasm of axillary lymph nodes ([place])  08/16/2019 Cancer Staging   Staging form: Breast, AJCC 8th Edition - Clinical stage  from 08/16/2019: Stage IIIA (cT0, cN2(f), cM0, G3, ER+, PR+, HER2-) - Signed by Derwood Kaplan, MD on 06/28/2020 Staging comments: TAC x 6 as neoadjuvant chemo    08/23/2019 Initial Diagnosis   Secondary malignant neoplasm of axillary lymph nodes ([place])   03/15/2020 Cancer Staging   Staging form: Breast, AJCC 8th Edition - Pathologic stage from 03/15/2020: No Stage Recommended (ypT0, pN2a, cM0, G3, ER+, PR+, HER2-) - Signed by Derwood Kaplan, MD on 06/28/2020 Staging comments: Mastectomy with no primary found, postop radiation, then hormonal Rx        REVIEW OF SYSTEMS:   Constitutional: Denies fevers, chills or abnormal weight loss Eyes: Denies blurriness of vision Ears, nose, mouth, throat, and face: Denies mucositis or sore throat Respiratory: Denies cough, dyspnea or wheezes Cardiovascular: Denies palpitation, chest discomfort or lower extremity swelling Gastrointestinal:  Denies nausea, heartburn or change in bowel habits Skin: Denies abnormal skin rashes Lymphatics: Denies new lymphadenopathy or easy bruising Neurological:Denies numbness, tingling or new weaknesses Behavioral/Psych: Increased anxiety and depression Breast: Left mastectomy with nodule All other systems were reviewed with the patient and are  negative.   VITALS:  Blood pressure (!) 134/92, pulse 94, temperature 99.1 F (37.3 C), temperature source Oral, resp. rate 18, height '5\' 5"'  (1.651 m), weight 248 lb 1.6 oz (112.5 kg), SpO2 97 %.  Wt Readings from Last 3 Encounters:  08/30/21 248 lb 1.6 oz (112.5 kg)  04/30/21 251 lb 14.4 oz (114.3 kg)  02/27/21 252 lb 6.4 oz (114.5 kg)    Body mass index is 41.29 kg/m.  Performance status (ECOG): 1 - Symptomatic but completely ambulatory  PHYSICAL EXAM:   GENERAL:alert, no distress and comfortable SKIN: skin color, texture, turgor are normal, no rashes or significant lesions EYES: normal, Conjunctiva are pink and non-injected, sclera clear OROPHARYNX:no exudate, no erythema and lips, buccal mucosa, and tongue normal  NECK: supple, thyroid normal size, non-tender, without nodularity LYMPH:  no palpable lymphadenopathy in the cervical, axillary or inguinal LUNGS: clear to auscultation and percussion with normal breathing effort HEART: regular rate & rhythm and no murmurs and no lower extremity edema ABDOMEN:abdomen soft, non-tender and normal bowel sounds Musculoskeletal:no cyanosis of digits and no clubbing  NEURO: alert & oriented x 3 with fluent speech, no focal motor/sensory deficits Breast: Right breast without mass; left breast with nodule noted to mastectomy site  LABORATORY DATA:  I have reviewed the data as listed    Component Value Date/Time   NA 138 08/30/2021 0000   K 3.5 08/30/2021 0000   CL 104 08/30/2021 0000   CO2 25 (A) 08/30/2021 0000   GLUCOSE 97 10/21/2008 0545   BUN 23 (A) 08/30/2021 0000   CREATININE 1.3 (A) 08/30/2021 0000   CREATININE 0.76 10/20/2008 2105   CALCIUM 9.1 08/30/2021 0000   PROT 6.0 10/20/2008 2105   ALBUMIN 4.1 08/30/2021 0000   AST 20 08/30/2021 0000   ALT 17 08/30/2021 0000   ALKPHOS 94 08/30/2021 0000   BILITOT 0.2 (L) 10/20/2008 2105   GFRNONAA >60 10/20/2008 2105   GFRAA  10/20/2008 2105    >60        The eGFR has been  calculated using the MDRD equation. This calculation has not been validated in all clinical situations. eGFR's persistently <60 mL/min signify possible Chronic Kidney Disease.    No results found for: SPEP, UPEP  Lab Results  Component Value Date   WBC 7.4 08/30/2021   NEUTROABS 5.03 08/30/2021   HGB 14.1 08/30/2021  HCT 42 08/30/2021   MCV 87 11/07/2020   PLT 296 08/30/2021      Chemistry      Component Value Date/Time   NA 138 08/30/2021 0000   K 3.5 08/30/2021 0000   CL 104 08/30/2021 0000   CO2 25 (A) 08/30/2021 0000   BUN 23 (A) 08/30/2021 0000   CREATININE 1.3 (A) 08/30/2021 0000   CREATININE 0.76 10/20/2008 2105   GLU 103 08/30/2021 0000      Component Value Date/Time   CALCIUM 9.1 08/30/2021 0000   ALKPHOS 94 08/30/2021 0000   AST 20 08/30/2021 0000   ALT 17 08/30/2021 0000   BILITOT 0.2 (L) 10/20/2008 2105       RADIOGRAPHIC STUDIES: I have personally reviewed the radiological images as listed and agreed with the findings in the report. No results found.

## 2021-08-30 NOTE — Assessment & Plan Note (Signed)
She is being managed by her PCP for anxiety and depression. She states this has increased significantly over the last couple of months. She is in touch with her PCP who would like for her to try therapy before she makes any adjustments to her medications and she is currently undergoing sessions with a therapist. She describes outside stressors including anxiety over her disability case being under review and different family issues. She is hoping that once these many issues are resolved, her anxiety will improve. She is noticeably more stressed today.

## 2021-08-30 NOTE — Assessment & Plan Note (Signed)
She has ongoing fatigue, but states it has worsened over the last couple of months. She has also had an increase in her anxiety and depression and thinks this might be exacerbating her fatigue. I will check iron studies today as well.

## 2021-09-05 DIAGNOSIS — R222 Localized swelling, mass and lump, trunk: Secondary | ICD-10-CM | POA: Diagnosis not present

## 2021-09-06 ENCOUNTER — Ambulatory Visit (INDEPENDENT_AMBULATORY_CARE_PROVIDER_SITE_OTHER): Payer: Medicare PPO | Admitting: Psychiatry

## 2021-09-06 DIAGNOSIS — F411 Generalized anxiety disorder: Secondary | ICD-10-CM

## 2021-09-06 NOTE — Progress Notes (Signed)
[facility] Counselor/Therapist Progress Note  Patient ID: Laurie Simmons, MRN: [id number],    Date: 09/06/2021  Time Spent: 50 minutes  Virtual Visit via Telehealth Note: MyChart Video session Connected with patient by a telemedicine/telehealth application, with their informed consent, and verified patient privacy and that I am speaking with the correct person using two identifiers. I discussed the limitations, risks, security and privacy concerns of performing psychotherapy and the availability of in person appointments. I also discussed with the patient that there may be a patient responsible charge related to this service. The patient expressed understanding and agreed to proceed. I discussed the treatment planning with the patient. The patient was provided an opportunity to ask questions and all were answered. The patient agreed with the plan and demonstrated an understanding of the instructions. The patient was advised to call  our office if  symptoms worsen or feel they are in a crisis state and need immediate contact.   Therapist Location:home office Patient Location: home   Treatment Type: Individual Therapy  Reported Symptoms: anxiety and depression  Mental Status Exam:  Appearance:   N/a   telehealth      Behavior:  Sharing  Motor:  Normal  Speech/Language:   Clear and Coherent  Affect:  N/a  telehealth  Mood:  anxious  Thought process:  goal directed  Thought content:    overthinking  Sensory/Perceptual disturbances:    WNL  Orientation:  oriented to person, place, time/date, situation, day of week, month of year, year, and stated date of Feb. 23, 2023  Attention:  Good  Concentration:  Good and Fair; "can't watch tv for longer than 30 minutes"  Memory:  Reports some forgetfulness after chemo or when times are busier  [employer] of knowledge:   Good  Insight:    Good and Fair  Judgment:   Good  Impulse Control:  Good   Risk Assessment: Danger to Self:   No Self-injurious Behavior: No Danger to Others: No Duty to Warn:no Physical Aggression / Violence:No  Access to Firearms a concern: No  Gang Involvement:No   Subjective:  Patient today reporting strong anxiety after getting her disability review paperwork and "totally freaked out and got hysterically crying and hyperventilating" and the thought of me losing disability and returning to work.  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  That's all I think about , hard to go to sleep.  Stressed over what to write down on my paperwork for the disability review.  States she found a lump in breast and is being biopsied on that, "they said they thought it was scar tissue but I'm very worried it could be cancer." "Been having some trouble with my depression, lower interest and energy levels, force myself to do my chores, want to sleep during the day and worry a lot at night. States she's worried about her disability review paperwork and sent it in recently. Has been in touch with supportive people incuding husband and cousin. Flashbacks to when I had cancer previously.  Biopsy this coming Wednesday "and am worrying all the time". "My mom worried a lot and I'm just like her." Looked at her patterns of worrying, assuming the negative or that things will go wrong versus right and tried to offer support her current situation but also offer some hope for her future in being able to make some changes and not automatically assume she is a chronic worrier and  can never get better.  Worked on her current stressors and anxiety which is based on assuming negatives are going to happen, and encouraged patient to try to make a shift from excessive worrying to a position of being more "neutral" since the info/test results she is assuming will be negative have not come back yet as she hasn't had procedure til this coming Wednesday.  This is virtual appt so hard to really read patient and her  ability to believe she can be better in some ways, or her motivation to try strategies suggested especially re: trying to interrupt some of the excessive worrying.   Interventions: Solution-Oriented/Positive Psychology and Ego-Supportive  Treatment goal plan: Patient not signing treatment plan on computer screen due to [place].  Treatment goals remain on treatment plan as patient works with strategies to try and achieve her goals.  Progress is assessed each session and noted in the "subject" and/or "plan" section of treatment note. Long-term goal: Develop healthy cognitive patterns and beliefs about self and the world that lead to alleviation and help prevent the relapse of depressive symptoms Short-term goal: Replace negative self-defeating self-talk with verbalization of realistic and positive cognitive messages. Strategies: Reinforce positive, reality-based cognitive messages that enhance self-confidence and increase adaptive action.    Diagnosis:   ICD-10-CM   1. Generalized anxiety disorder  F41.1      Plan:  Patient today showing some motivation and participation in session as we worked on confronting her excessive anxiety and worrying which she reports more currently is relating to her disability review and a biopsy to be done next week.  She focused most of the time on her thoughts and feelings that things are not going to go well with her review and with her biopsy and how this is causing her great worry.  Listened to patient as she explained details, and how she feels powerless to change. Explained how she and her mother have always been worriers and haven't been able to not worry.  As noted above, worked with patient on how excessive worrying and assuming worst case scenarios are not based on facts but rather assumptions and tend to aggravate anxiety and depression. Tried to offer encouragement to her that she can eventually change from "excessive worrying about a lot of things", not to  mean she won't ever be stressed about various concerns but to also explain differences in excessive worrying/ assuming the worst versus being concerned about things  but not rushing to assumptions when we don't have much information to really go on. Advantages of "staying in neutral" til we have more info discussed. Patient to work some as she is able, on not getting pulled into totally negative thought patterns and work instead on trying to achieve that more neutral position until she does have more information/answers.   Goal review and progress/challenges noted with patient.  Next appt within 3 weeks.  This record has been created using [employer].  Chart creation errors have been sought, but may not always have been located and corrected.  Such creation errors do not reflect on the standard of medical care provided.   Shanon Ace, LCSW

## 2021-09-10 NOTE — Progress Notes (Signed)
[facility]  [address] [place],  [place]  [address] [phone]  Clinic Day:  09/14/2021  Referring physician: Marco Collie, MD   This document serves as a record of services personally performed by Hosie Poisson, MD. It was created on their behalf by Curry,Lauren E, a trained medical scribe. The creation of this record is based on the scribe's personal observations and the provider's statements to them.  CHIEF COMPLAINT:  CC: Clinical stage IIB hormone receptor positive left breast cancer  Current Treatment:  Tamoxifen 20 mg daily   HISTORY OF PRESENT ILLNESS:  Laurie Simmons is a 56 y.o. female with clinical stage IIB (TX N2 M0) hormone receptor positive left breast cancer with left axillary adenopathy, but no obvious breast primary diagnosed in February.  She developed left axillary lymphadenopathy in December 2020 which did not improve with antibiotics.  Bilateral diagnostic mammogram and left breast ultrasound in January revealed 4 enlarged lymph nodes in the left axilla with the largest measuring 5.5 cm.  Ultrasound guided biopsy in February revealed poorly differentiated carcinoma.  Immunophenotype is consistent with metastatic carcinoma and with GATA-3 positivity, which is most consistent with metastatic breast carcinoma.  Estrogen receptors were positive at 95% and progesterone receptors were positive at 50.  HER2 was negative.  Ki67 was 20%.  Bilateral breast MRI in March did not reveal any evidence of primary malignancy.  It also revealed left axillary lymphadenopathy with 6 enlarged lymph nodes, the largest measuring 3 x 6 cm.  She has been seen by Dr. Noberto Retort to discuss mastectomy and lymph node dissection versus breast conservation.  She had been scheduled for a hysterectomy due to a large uterine fibroid measuring 9.8 cm, as well as uterine enlargement with Dr. Marvel Plan around the time of her diagnosis, so this has now been put on hold.  She  had been on Provera 10 mg daily.  Provera was subsequently increased to 20 mg daily due to persistent heavy vaginal bleeding.  MRI brain did not reveal any metastatic disease.  PET scan revealed enlarged hypermetabolic left subpectoral and axillary adenopathy, with an index node measuring 3.3 cm in short axis with maximum SUV 6.7.  No other hypermetabolic activity was observed.  Echocardiogram revealed ejection fraction between 60 and 65%.   She is receiving neoadjuvant chemotherapy with docetaxel/doxorubicin/cyclophosphamide (TAC) and had her 1st cycle on March 30th and has tolerated this fairly well.  She had worsening depression, being managed by her psychiatrist and Dr. Nyra Capes.  She reported insomnia so Dr. Nyra Capes had placed her on olanzapine 5 mg at bedtime.  The patient felt that this was too sedating, so we recommended she discuss this with Dr. Nyra Capes.  We had decreased her Provera to 10 mg daily, as her vaginal bleeding had improved.  She had a repeat echocardiogram on June 17th, which revealed normal left ventricular size and function with an ejection fraction of 60-65%, which was stable.  She completed 6 cycles of TAC chemotherapy in mid July.  CT chest, abdomen and pelvis in August did not reveal any evidence of metastatic disease.  Repeat MRI breast in August did not reveal any disease within the breast.  The left axillary lymph nodes had decreased by about 50%.  She underwent left mastectomy and axillary dissection on September 1st with Dr. Noberto Retort.  The final pathology did not reveal any malignancy within the breast.  9/26 lymph nodes were positive for metastasis, the largest measuring 15 mm.  She is  premenopausal.  She was referred for post mastectomy radiation and completed this on November 23rd.  She was taking NSAID's for her back pain but I had her stop that last month due to a rise in the creatinine to 1.6.  Her baseline creatinine has been mildly elevated at 1.3.  Bone density scan from  September was normal.  At the end of December we had her start tamoxifen.  She continues to be on hold with her hysterectomy. Her fibroid has decreased in size; however, her uterus remains too large for a vaginal hysterectomy. Her surgeon has asked that she lose weight before he will proceed with hysterectomy. She had her annual mammogram and visit with Dr.Lininger in August 2022 which was all clear.  INTERVAL HISTORY:  Laurie Simmons is here for follow up to review recent imaging results. An ultrasound of the left breast was obtained after the patient was able to palpate an area of concern. This revealed an indeterminate mass in the inferior left breast below the mastectomy scar, possibly representing fat necrosis, but a biopsy was recommended. Biopsy from March 1st revealed adipose tissue with fat necrosis, fibrosis and chronic inflammation. No malignancy identified. She states that she is doing well other than anxiety and stress from her recent results. She is relieved that the biopsy is benign. She continues tamoxifen daily, but notes fatigue, depression and insomnia. She also has struggled to lose weight since starting this medication. Her  appetite is good, and her weight is stable since her last visit.  She denies fever, chills or other signs of infection.  She denies nausea, vomiting, bowel issues, or abdominal pain.  She denies sore throat, cough, dyspnea, or chest pain.  REVIEW OF SYSTEMS:  Review of Systems  Constitutional:  Positive for fatigue. Negative for appetite change, chills, fever and unexpected weight change.  HENT:  Negative.    Eyes: Negative.   Respiratory: Negative.  Negative for chest tightness, cough, hemoptysis, shortness of breath and wheezing.   Cardiovascular: Negative.  Negative for chest pain, leg swelling and palpitations.  Gastrointestinal: Negative.  Negative for abdominal distention, abdominal pain, blood in stool, constipation, diarrhea, nausea and vomiting.  Endocrine:  Negative.   Genitourinary: Negative.  Negative for difficulty urinating, dysuria, frequency and hematuria.   Musculoskeletal: Negative.  Negative for arthralgias, back pain, flank pain, gait problem and myalgias.  Skin: Negative.   Neurological: Negative.  Negative for dizziness, extremity weakness, gait problem, headaches, light-headedness, numbness, seizures and speech difficulty.  Hematological: Negative.   Psychiatric/Behavioral:  Positive for depression. Negative for sleep disturbance. The patient is nervous/anxious.     VITALS:  Height '5\' 5"'  (1.651 m), weight 248 lb 1.6 oz (112.5 kg).  Wt Readings from Last 3 Encounters:  09/14/21 248 lb 1.6 oz (112.5 kg)  08/30/21 248 lb 1.6 oz (112.5 kg)  04/30/21 251 lb 14.4 oz (114.3 kg)    Body mass index is 41.29 kg/m.  Performance status (ECOG): 1 - Symptomatic but completely ambulatory  PHYSICAL EXAM:  Physical Exam Constitutional:      General: She is not in acute distress.    Appearance: Normal appearance. She is normal weight.  HENT:     Head: Normocephalic and atraumatic.  Eyes:     General: No scleral icterus.    Extraocular Movements: Extraocular movements intact.     Conjunctiva/sclera: Conjunctivae normal.     Pupils: Pupils are equal, round, and reactive to light.  Cardiovascular:     Rate and Rhythm: Normal rate and  regular rhythm.     Pulses: Normal pulses.     Heart sounds: Normal heart sounds. No murmur heard.   No friction rub. No gallop.  Pulmonary:     Effort: Pulmonary effort is normal. No respiratory distress.     Breath sounds: Normal breath sounds.  Chest:     Comments: Firm tissue above the left mastectomy that feels consistent with scar tissue. Otherwise, negative. No masses in the right breast. Abdominal:     General: Bowel sounds are normal. There is no distension.     Palpations: Abdomen is soft. There is no hepatomegaly, splenomegaly or mass.     Tenderness: There is no abdominal tenderness.   Musculoskeletal:        General: Normal range of motion.     Cervical back: Normal range of motion and neck supple.     Right lower leg: No edema.     Left lower leg: No edema.  Lymphadenopathy:     Cervical: No cervical adenopathy.  Skin:    General: Skin is warm and dry.  Neurological:     General: No focal deficit present.     Mental Status: She is alert and oriented to person, place, and time. Mental status is at baseline.  Psychiatric:        Mood and Affect: Mood normal.        Behavior: Behavior normal.        Thought Content: Thought content normal.        Judgment: Judgment normal.    LABS:   CBC Latest Ref Rng & Units 08/30/2021 04/30/2021 02/27/2021  WBC - 7.4 6.6 6.2  Hemoglobin 12.0 - 16.0 14.1 13.8 13.6  Hematocrit 36 - 46 42 42 40  Platelets 150 - 399 296 265 291   CMP Latest Ref Rng & Units 08/30/2021 04/30/2021 02/27/2021  Glucose 70 - 99 mg/dL - - -  BUN 4 - 21 23(A) - 20  Creatinine 0.5 - 1.1 1.3(A) 1.1 1.1  Sodium 137 - 147 138 140 136(A)  Potassium 3.4 - 5.3 3.5 3.8 4.3  Chloride 99 - 108 104 104 101  CO2 13 - 22 25(A) 28(A) 28(A)  Calcium 8.7 - 10.7 9.1 9.1 9.2  Total Protein 6.0 - 8.3 g/dL - - -  Total Bilirubin 0.3 - 1.2 mg/dL - - -  Alkaline Phos 25 - 125 94 98 87  AST 13 - 35 '20 20 22  ' ALT 7 - 35 '17 15 14    ' Lab Results  Component Value Date   LDH 185 10/20/2008     STUDIES:    EXAM: 09/05/2021 ULTRASOUND OF THE LEFT BREAST   COMPARISON: Previous exam(s).   FINDINGS:  On physical exam, at the site of concern reported by the patient  along the superior aspect of the mastectomy scar I do not feel a  discrete mass or focal area of thickening. Along the lumpectomy scar  there is nodularity which may be related to scar tissue.  Targeted ultrasound is performed at the palpable sites of concern  above the mastectomy scar demonstrating no cystic or solid mass.  Below the mastectomy scar there is an irregular complex mass  measuring 0.6  x 0.6 x 0.8 cm, possibly fat necrosis. No internal  vascularity. No additional cystic or solid mass identified. Targeted  ultrasound of the left axilla demonstrates no abnormality.   IMPRESSION:  Indeterminate mass in the inferior left breast below the mastectomy  scar, possibly representing fat  necrosis.   SURGICAL PATHOLOGY: 09/12/2021 Soft tissue needle core biopsy, left chest/breast, coil clip:  Adipose tissue with fat necrosis, fibrosis and chronic inflammation  No malignancy identified.  Allergies:  Allergies  Allergen Reactions   Effexor [Venlafaxine] Palpitations   Nsaids Other (See Comments)    Pt reports kidney disease Other reaction(s): Other (See Comments) Pt reports kidney disease Pt reports kidney disease    Current Medications: Current Outpatient Medications  Medication Sig Dispense Refill   albuterol (VENTOLIN HFA) 108 (90 Base) MCG/ACT inhaler INHALE 2 PUFFS INTO THE LUNGS EVERY 4 TO 6 HOURS AS NEEDED FOR COUGH OR WHEEZING 18 g 1   anastrozole (ARIMIDEX) 1 MG tablet Take 1 tablet (1 mg total) by mouth daily. 30 tablet 3   ARIPiprazole (ABILIFY) 2 MG tablet Take 1 tablet (2 mg total) by mouth daily. 90 tablet 3   Azelastine HCl 0.15 % SOLN Can use one spray in each nostril two times daily if needed. 90 mL 1   clorazepate (TRANXENE) 3.75 MG tablet Take one tablet up to four times daily as needed for anxiety/panic attacks. 120 tablet 2   docusate sodium (COLACE) 50 MG capsule Take 50 mg by mouth 2 (two) times daily.     famotidine (PEPCID) 40 MG tablet TAKE 1 TABLET BY MOUTH TWICE DAILY AS DIRECTED 60 tablet 5   fluticasone-salmeterol (ADVAIR DISKUS) 250-50 MCG/ACT AEPB INHALE 1 PUFF BY MOUTH TWICE DAILY TO PREVENT COUGH OR WHEEZE. RINSE MOUTH AFTER USE 60 each 4   folic acid (FOLVITE) 1 MG tablet TAKE 1 TABLET BY MOUTH DAILY 30 tablet 5   loratadine (CLARITIN) 10 MG tablet Take 1 tablet (10 mg total) by mouth daily. 90 tablet 3   ondansetron (ZOFRAN) 4 MG tablet  TAKE 1 TABLET BY MOUTH EVERY 4 HOURS AS NEEDED FOR NAUSEA 30 tablet 0   potassium chloride (KLOR-CON) 10 MEQ tablet TAKE 1 TABLET BY MOUTH TWICE DAILY 60 tablet 1   prochlorperazine (COMPAZINE) 10 MG tablet TAKE 1 TABLET BY MOUTH EVERY 6 HOURS AS NEEDED FOR NAUSEA 90 tablet 1   rosuvastatin (CRESTOR) 10 MG tablet Take 10 mg by mouth. Takes twice a week  1   Semaglutide,0.25 or 0.5MG/DOS, (OZEMPIC, 0.25 OR 0.5 MG/DOSE,) 2 MG/1.5ML SOPN Inject 0.5 mg into the skin. Once weekly     tamoxifen (NOLVADEX) 20 MG tablet TAKE 1 TABLET(20 MG) BY MOUTH DAILY 90 tablet 3   topiramate (TOPAMAX) 50 MG tablet Take 1 tablet (50 mg total) by mouth daily. 90 tablet 3   UNABLE TO FIND Allergy injections for cats, grass, and dust.     valsartan-hydrochlorothiazide (DIOVAN-HCT) 320-25 MG tablet Take 1 tablet by mouth daily.  0   VIIBRYD 40 MG TABS Take 1 tablet (40 mg total) by mouth daily. 90 tablet 3   vitamin B-12 (CYANOCOBALAMIN) 500 MCG tablet Take 500 mcg by mouth daily.     No current facility-administered medications for this visit.     ASSESSMENT & PLAN:   Assessment:   1. Clinical stage IIB (TXN2M0), grade 3, ductal carcinoma with no breast primary, diagnosed in February 2021.  She received neoadjuvant chemotherapy with docetaxel/doxorubicin/cyclophosphamide completed in July.  She underwent left mastectomy and axillary dissection, with no breast primary found and 9/26 lymph nodes positive for metastasis. She completed adjuvant radiation at the end of November, and was placed on hormonal therapy with tamoxifen at the end of December. Baseline bone density scan was completely normal. She continues tamoxifen with complaints, and  so we will switch her to anastrozole 1 mg daily. She has not had a menstrual cycle in over 1 year.  2. Large uterine fibroid.  She is planned for a hysterectomy/BSO this year pending weight loss.   She has not had a menstrual cycle in over 1 year.  3. Depression and anxiety,  managed by Dr. Nyra Capes and her psychiatrist. She has been placed on Celexa per her physician.   4.  Small hard nodule in the mid mastectomy incision, stable. Ultrasound was uncertain and so a biopsy was performed and confirms fat necrosis and fibrosis.   5. Right hip pain.There is noted a rounded calcific density lateral and anterior to the distal right femur which corresponds in location to area of abnormal uptake seen on bone scan. Calcified soft tissue metastasis cannot be excluded. MRI was benign. She has completed physical therapy.  6. Neuropathy. We will continue to monitor. She does not wish to treat at this time as it is improving.   Plan: Recent biopsy is benign. As requested, we reviewed alternative treatment options with anastrozole and letrozole in view of her toxicities with tamoxifen. The potential toxicities of the alternatives were reviewed today. As she has not had a menstrual period in over 1 year, I feel it would be safe to pursue either of these options. She is in agreement, so I will send in anastrozole 1 mg today. She will be due for repeat bone density in September. We will plan to see her back in 4 months for repeat evaluation. I wrote her a prescription for mastectomy bras. She verbalizes understanding of and agreement to the plans discussed today. She knows to call the office should any new questions or concerns arise.    Derwood Kaplan, MD [facility] AT [facility] [address] [place] [address] Dept: [phone] Dept Fax: [phone]   I, Rita Ohara, am acting as scribe for Derwood Kaplan, MD  I have reviewed this report as typed by the medical scribe, and it is complete and accurate.

## 2021-09-11 ENCOUNTER — Encounter: Payer: Self-pay | Admitting: Hematology and Oncology

## 2021-09-12 ENCOUNTER — Ambulatory Visit (INDEPENDENT_AMBULATORY_CARE_PROVIDER_SITE_OTHER): Payer: BC Managed Care – PPO | Admitting: *Deleted

## 2021-09-12 DIAGNOSIS — J309 Allergic rhinitis, unspecified: Secondary | ICD-10-CM | POA: Diagnosis not present

## 2021-09-12 DIAGNOSIS — N641 Fat necrosis of breast: Secondary | ICD-10-CM | POA: Diagnosis not present

## 2021-09-12 DIAGNOSIS — R928 Other abnormal and inconclusive findings on diagnostic imaging of breast: Secondary | ICD-10-CM | POA: Diagnosis not present

## 2021-09-12 DIAGNOSIS — N6032 Fibrosclerosis of left breast: Secondary | ICD-10-CM | POA: Diagnosis not present

## 2021-09-12 DIAGNOSIS — N6012 Diffuse cystic mastopathy of left breast: Secondary | ICD-10-CM | POA: Diagnosis not present

## 2021-09-14 ENCOUNTER — Other Ambulatory Visit: Payer: Self-pay

## 2021-09-14 ENCOUNTER — Other Ambulatory Visit: Payer: Self-pay | Admitting: Oncology

## 2021-09-14 ENCOUNTER — Other Ambulatory Visit: Payer: Self-pay | Admitting: Hematology and Oncology

## 2021-09-14 ENCOUNTER — Inpatient Hospital Stay: Payer: BC Managed Care – PPO | Attending: Hematology and Oncology | Admitting: Oncology

## 2021-09-14 VITALS — Ht 65.0 in | Wt 248.1 lb

## 2021-09-14 DIAGNOSIS — C773 Secondary and unspecified malignant neoplasm of axilla and upper limb lymph nodes: Secondary | ICD-10-CM

## 2021-09-14 MED ORDER — ANASTROZOLE 1 MG PO TABS: 1.0000 mg | ORAL_TABLET | Freq: Every day | ORAL | 3 refills | Status: DC

## 2021-09-21 ENCOUNTER — Encounter: Payer: Self-pay | Admitting: Oncology

## 2021-09-25 ENCOUNTER — Other Ambulatory Visit: Payer: Self-pay

## 2021-09-25 ENCOUNTER — Ambulatory Visit (INDEPENDENT_AMBULATORY_CARE_PROVIDER_SITE_OTHER): Payer: Medicare PPO | Admitting: Psychiatry

## 2021-09-25 DIAGNOSIS — F411 Generalized anxiety disorder: Secondary | ICD-10-CM | POA: Diagnosis not present

## 2021-09-25 NOTE — Progress Notes (Signed)
?    [facility] Counselor/Therapist Progress Note ? ?Patient ID: Laurie Simmons, MRN: [id number],   ? ?Date: 09/25/2021 ? ?Time Spent: 48  minutes  ? ?Treatment Type: Individual Therapy ? ?Reported Symptoms: anxiety, depression "better", fatigue ? ?Mental Status Exam: ? ?Appearance:   Casual     ?Behavior:  Appropriate, Sharing, and Motivated  ?Motor:  Normal  ?Speech/Language:   Clear and Coherent  ?Affect:  anxious  ?Mood:  anxious  ?Thought process:  goal directed  ?Thought content:    overthinking  ?Sensory/Perceptual disturbances:    WNL  ?Orientation:  oriented to person, place, time/date, situation, day of week, month of year, year, and stated date of September 25, 2021  ?Attention:  Fair  ?Concentration:  Fair  ?Memory:  Some short term memory concerns  ?Fund of knowledge:   Good  ?Insight:    Good  ?Judgment:   Good  ?Impulse Control:  Good  ? ?Risk Assessment: ?Danger to Self:  No ?Self-injurious Behavior: No ?Danger to Others: No ?Duty to Warn:no ?Physical Aggression / Violence:No  ?Access to Firearms a concern: No  ?Gang Involvement:No  ? ?Subjective:  Patient today reporting anxiety but did find out from her recent biopsy that "it was scar tissue and not another problem to be worried about."  Wanting to discuss issues with her last job and still having bad memories or things that I see now that may remind me of past stressors. Gave specific example which we worked with today in session. Tendency to assume the worst and look for what might go wrong, and was able to work on this further in session today.  Noticeably more relaxed than last session since she got the results of the biopsy that it was scar tissue.  Energy level some better at times and her anxiety fluctuates at times as well.  Depression some better.  Still reporting lower interest in activities.  Staying in touch with supportive people and family.  States again today that her mom "is a Research officer, trade union just like me".  Trying not to assume the negative and  we will be working on this in between sessions.  Discussed again today the ability of staying in a "neutral" position until getting feedback or hearing test results for example, rather than excessively worrying from the start and maintaining that worry until she does get test results or feedback.  ? ?Interventions: Solution-Oriented/Positive Psychology, Ego-Supportive, and Insight-Oriented ?  ?Treatment goal plan: ?Patient not signing treatment plan on computer screen due to [place].  Treatment goals remain on treatment plan as patient works with strategies to try and achieve her goals.  Progress is assessed each session and noted in the "subject" and/or "plan" section of treatment note. ?Long-term goal: ?Develop healthy cognitive patterns and beliefs about self and the world that lead to alleviation and help prevent the relapse of depressive symptoms ?Short-term goal: ?Replace negative self-defeating self-talk with verbalization of realistic and positive cognitive messages. ?Strategies: ?Reinforce positive, reality-based cognitive messages that enhance self-confidence and increase adaptive action. ?  ?Diagnosis: ?  ICD-10-CM   ?1. Generalized anxiety disorder  F41.1   ?  ? ? ?Plan: Patient today showing some motivation and did participate well in session today working further on her anxiety and excessive worrying  about her health concerns and her disability review.  Some better today as since she found out from her latest test that "it was on scar tissue", her worrying has not been quite as excessive and seems to feel  a little more encouraged.  Still feels somewhat powerless to change but states she will try and did work in session today with some of her assumptions that tend to instill fear and anxiety.  Encouraged patient in her staying in the present and focusing on what she can control or change, trying to avoid making assumptions that are negative and fear inducing, getting outside some daily as she is able,  staying in touch with people who are supportive, looking for the positives in every day and within herself, remaining on her prescribed medication, healthy nutrition and exercise, stop assuming worst case scenarios, practice more consistent positive self talk, read inspirational books or listen to helpful podcasts that feel supportive to her, consider using daily affirmations that are supportive, stop self negating, reduce overthinking and over analyzing, challenge and counteract her self doubt, allow her faith to be an emotional support as well as spiritual, recognize the strength she shows as she works with goal-directed behaviors to move in a direction that supports her improved emotional health and overall wellbeing. ? ?Goal review and progress/challenges noted with patient. ? ?Next appointment within 2 to 3 weeks. ? ?This record has been created using [employer].  Chart creation errors have been sought, but may not always have been located and corrected.  Such creation errors do not reflect on the standard of medical care provided. ? ? ?Shanon Ace, LCSW ? ? ? ? ? ? ? ? ? ? ? ? ? ? ? ? ? ? ?

## 2021-09-26 ENCOUNTER — Telehealth: Payer: Self-pay | Admitting: Adult Health

## 2021-09-26 NOTE — Telephone Encounter (Signed)
Patient lvm at 3:18 today. She is requesting if there is an alternate Rollene Fare can prescribe that the insurance will pay for. Sharifa stated currently her insurance is not covering the cost of the Rx. # [phone]. ?

## 2021-09-26 NOTE — Telephone Encounter (Signed)
Received a PA thru [facility] today. Initiated it and let patient know. She said she had about 3 weeks of samples.  ?

## 2021-09-27 ENCOUNTER — Other Ambulatory Visit: Payer: Self-pay

## 2021-09-27 MED ORDER — VILAZODONE HCL 40 MG PO TABS: 40.0000 mg | ORAL_TABLET | Freq: Every day | ORAL | 0 refills | Status: DC

## 2021-09-27 NOTE — Telephone Encounter (Signed)
Prior Authorization approved for VIIBRYD 40 MG and VILAZODONE 40 MG according to cover my meds effective 07/15/2021-07/14/2022, PA# [id number]. ?

## 2021-09-27 NOTE — Telephone Encounter (Signed)
Rx sent for generic. Cost is $45 for 29-monthsupply. Patient notified.  ?

## 2021-10-01 ENCOUNTER — Ambulatory Visit (INDEPENDENT_AMBULATORY_CARE_PROVIDER_SITE_OTHER): Payer: BC Managed Care – PPO | Admitting: *Deleted

## 2021-10-01 DIAGNOSIS — E1169 Type 2 diabetes mellitus with other specified complication: Secondary | ICD-10-CM | POA: Diagnosis not present

## 2021-10-01 DIAGNOSIS — J309 Allergic rhinitis, unspecified: Secondary | ICD-10-CM | POA: Diagnosis not present

## 2021-10-01 DIAGNOSIS — I10 Essential (primary) hypertension: Secondary | ICD-10-CM | POA: Diagnosis not present

## 2021-10-01 DIAGNOSIS — E785 Hyperlipidemia, unspecified: Secondary | ICD-10-CM | POA: Diagnosis not present

## 2021-10-08 DIAGNOSIS — E785 Hyperlipidemia, unspecified: Secondary | ICD-10-CM | POA: Diagnosis not present

## 2021-10-08 DIAGNOSIS — E1169 Type 2 diabetes mellitus with other specified complication: Secondary | ICD-10-CM | POA: Diagnosis not present

## 2021-10-08 DIAGNOSIS — I10 Essential (primary) hypertension: Secondary | ICD-10-CM | POA: Diagnosis not present

## 2021-10-23 ENCOUNTER — Encounter: Payer: Self-pay | Admitting: Adult Health

## 2021-10-23 ENCOUNTER — Ambulatory Visit (INDEPENDENT_AMBULATORY_CARE_PROVIDER_SITE_OTHER): Payer: 59 | Admitting: Adult Health

## 2021-10-23 DIAGNOSIS — F39 Unspecified mood [affective] disorder: Secondary | ICD-10-CM

## 2021-10-23 DIAGNOSIS — F411 Generalized anxiety disorder: Secondary | ICD-10-CM | POA: Diagnosis not present

## 2021-10-23 DIAGNOSIS — F410 Panic disorder [episodic paroxysmal anxiety] without agoraphobia: Secondary | ICD-10-CM | POA: Diagnosis not present

## 2021-10-23 DIAGNOSIS — F331 Major depressive disorder, recurrent, moderate: Secondary | ICD-10-CM

## 2021-10-23 NOTE — Progress Notes (Signed)
[name] ?[id number] ?[birth year] ?56 y.o. ? ?Subjective:  ? ?Patient ID:  Laurie Simmons is a 56 y.o. (DOB 05/26/1966) female. ? ?Chief Complaint: No chief complaint on file. ? ? ?HPI ?Laurie Simmons Theiler presents to the office today for follow-up of anxiety, panic attacks, depression, and mood disorder. ? ?Describes mood today as "ok". Pleasant. Tearful at times. Mood symptoms - reports increased anxiety. Decreased depression and irritability. Denies panic attacks - has feeling of panic from what she went through. Mood is consistent - having some off days - no mood swings. Feels like current medications are working well for her.   Family doing well. Stable interest and motivation. Taking medications as prescribed. ?Energy levels lower. Active, does not have a regular exercise routine with current physical disabilities. Walking most days.Has finished with P/T. ?Enjoys some usual interests and activities. Married. Lives with husband and their two children - son 61 and daughter at [employer]. Mostly staying home. ?Appetite adequate. Weight loss - 5 pounds - 240 pounds. Working with weight loss management - Ozempic. ?Sleeps better some nights than others. Averages 7 hours of broken sleep. Napping during the day. ?Focus and concentration difficulties. Feeling more alert. Starting to get things done around the house. Completing tasks. Currently disabled and receiving SSI benefits. ?Denies SI or HI.  ?Denies AH or VH.  ? ?  ?Review of Systems:  ?Review of Systems  ?Musculoskeletal:  Negative for gait problem.  ?Neurological:  Negative for tremors.  ?Psychiatric/Behavioral:    ?     Please refer to HPI  ? ?Medications: I have reviewed the patient's current medications. ? ?Current Outpatient Medications  ?Medication Sig Dispense Refill  ? albuterol (VENTOLIN HFA) 108 (90 Base) MCG/ACT inhaler INHALE 2 PUFFS INTO THE LUNGS EVERY 4 TO 6 HOURS AS NEEDED FOR COUGH OR WHEEZING 18 g 1  ? anastrozole (ARIMIDEX) 1 MG tablet Take 1 tablet (1 mg total)  by mouth daily. 30 tablet 3  ? ARIPiprazole (ABILIFY) 2 MG tablet Take 1 tablet (2 mg total) by mouth daily. 90 tablet 3  ? Azelastine HCl 0.15 % SOLN Can use one spray in each nostril two times daily if needed. 90 mL 1  ? clorazepate (TRANXENE) 3.75 MG tablet Take one tablet up to four times daily as needed for anxiety/panic attacks. 120 tablet 2  ? docusate sodium (COLACE) 50 MG capsule Take 50 mg by mouth 2 (two) times daily.    ? famotidine (PEPCID) 40 MG tablet TAKE 1 TABLET BY MOUTH TWICE DAILY AS DIRECTED 60 tablet 5  ? fluticasone-salmeterol (ADVAIR DISKUS) 250-50 MCG/ACT AEPB INHALE 1 PUFF BY MOUTH TWICE DAILY TO PREVENT COUGH OR WHEEZE. RINSE MOUTH AFTER USE 60 each 4  ? folic acid (FOLVITE) 1 MG tablet TAKE 1 TABLET BY MOUTH DAILY 30 tablet 5  ? loratadine (CLARITIN) 10 MG tablet Take 1 tablet (10 mg total) by mouth daily. 90 tablet 3  ? ondansetron (ZOFRAN) 4 MG tablet TAKE 1 TABLET BY MOUTH EVERY 4 HOURS AS NEEDED FOR NAUSEA 30 tablet 0  ? potassium chloride (KLOR-CON) 10 MEQ tablet TAKE 1 TABLET BY MOUTH TWICE DAILY 60 tablet 1  ? prochlorperazine (COMPAZINE) 10 MG tablet TAKE 1 TABLET BY MOUTH EVERY 6 HOURS AS NEEDED FOR NAUSEA 90 tablet 1  ? rosuvastatin (CRESTOR) 10 MG tablet Take 10 mg by mouth. Takes twice a week  1  ? Semaglutide,0.25 or 0.5MG/DOS, (OZEMPIC, 0.25 OR 0.5 MG/DOSE,) 2 MG/1.5ML SOPN Inject 0.5 mg into the  skin. Once weekly    ? topiramate (TOPAMAX) 50 MG tablet Take 1 tablet (50 mg total) by mouth daily. 90 tablet 3  ? UNABLE TO FIND Allergy injections for cats, grass, and dust.    ? valsartan-hydrochlorothiazide (DIOVAN-HCT) 320-25 MG tablet Take 1 tablet by mouth daily.  0  ? VIIBRYD 40 MG TABS Take 1 tablet (40 mg total) by mouth daily. 90 tablet 3  ? Vilazodone HCl (VIIBRYD) 40 MG TABS Take 1 tablet (40 mg total) by mouth daily. 90 tablet 0  ? vitamin B-12 (CYANOCOBALAMIN) 500 MCG tablet Take 500 mcg by mouth daily.    ? ?No current facility-administered medications for this  visit.  ? ? ?Medication Side Effects: None ? ?Allergies:  ?Allergies  ?Allergen Reactions  ? Effexor [Venlafaxine] Palpitations  ? Nsaids Other (See Comments)  ?  Pt reports kidney disease ?Other reaction(s): Other (See Comments) ?Pt reports kidney disease ?Pt reports kidney disease  ? ? ?Past Medical History:  ?Diagnosis Date  ? Anxiety   ? Arthritis   ? Asthma   ? Breast cancer ([place]) 2021  ? Carpal tunnel syndrome   ? Carpal tunnel syndrome   ? bilateral  ? Chronic kidney disease   ? stage 3  ? Depression   ? Diabetes ([place])   ? GERD (gastroesophageal reflux disease)   ? Hypertension   ? Irritable bowel syndrome   ? Severe anemia [id number]  ? ? ?Past Medical History, Surgical history, Social history, and Family history were reviewed and updated as appropriate.  ? ?Please see review of systems for further details on the patient's review from today.  ? ?Objective:  ? ?Physical Exam:  ?There were no vitals taken for this visit. ? ?Physical Exam ?Constitutional:   ?   General: She is not in acute distress. ?Musculoskeletal:     ?   General: No deformity.  ?Neurological:  ?   Mental Status: She is alert and oriented to person, place, and time.  ?   Coordination: Coordination normal.  ?Psychiatric:     ?   Attention and Perception: Attention and perception normal. She does not perceive auditory or visual hallucinations.     ?   Mood and Affect: Mood normal. Mood is not anxious or depressed. Affect is not labile, blunt, angry or inappropriate.     ?   Speech: Speech normal.     ?   Behavior: Behavior normal.     ?   Thought Content: Thought content normal. Thought content is not paranoid or delusional. Thought content does not include homicidal or suicidal ideation. Thought content does not include homicidal or suicidal plan.     ?   Cognition and Memory: Cognition and memory normal.     ?   Judgment: Judgment normal.  ?   Comments: Insight intact  ? ? ?Lab Review:  ?   ?Component Value Date/Time  ? NA 138 08/30/2021 0000  ?  K 3.5 08/30/2021 0000  ? CL 104 08/30/2021 0000  ? CO2 25 (A) 08/30/2021 0000  ? GLUCOSE 97 10/21/2008 0545  ? BUN 23 (A) 08/30/2021 0000  ? CREATININE 1.3 (A) 08/30/2021 0000  ? CREATININE 0.76 10/20/2008 2105  ? CALCIUM 9.1 08/30/2021 0000  ? PROT 6.0 10/20/2008 2105  ? ALBUMIN 4.1 08/30/2021 0000  ? AST 20 08/30/2021 0000  ? ALT 17 08/30/2021 0000  ? ALKPHOS 94 08/30/2021 0000  ? BILITOT 0.2 (L) 10/20/2008 2105  ? GFRNONAA >60 10/20/2008 2105  ?  GFRAA  10/20/2008 2105  ?  >60        ?The eGFR has been calculated ?using the MDRD equation. ?This calculation has not been ?validated in all clinical ?situations. ?eGFR's persistently ?<60 mL/min signify ?possible Chronic Kidney Disease.  ? ? ?   ?Component Value Date/Time  ? WBC 7.4 08/30/2021 0000  ? WBC 12.3 (H) 10/22/2008 0530  ? RBC 4.92 08/30/2021 0000  ? HGB 14.1 08/30/2021 0000  ? HGB 11.6 12/23/2018 1146  ? HCT 42 08/30/2021 0000  ? HCT 36.4 12/23/2018 1146  ? PLT 296 08/30/2021 0000  ? MCV 87 11/07/2020 0000  ? MCH 22.3 (L) 12/23/2018 1146  ? MCHC 31.9 12/23/2018 1146  ? MCHC 33.7 10/22/2008 0530  ? RDW 16.9 (H) 12/23/2018 1146  ? LYMPHSABS 2.8 12/23/2018 1146  ? EOSABS 0.2 12/23/2018 1146  ? BASOSABS 0.1 12/23/2018 1146  ? ? ?No results found for: POCLITH, LITHIUM  ? ?No results found for: PHENYTOIN, PHENOBARB, VALPROATE, CBMZ  ? ?.res ?Assessment: Plan:   ? ?Plan: ? ?1. Viibryd 61m daily ?2. Tranxene 3.75mup to 4 times daily ?3. Topamax 50 mg daily ?4. Abilify 69m46maily ? ?Set up with therapist ? ?RTC 3 months ? ?Patient advised to contact office with any questions, adverse effects, or acute worsening in signs and symptoms. ? ?Discussed potential benefits, risk, and side effects of benzodiazepines to include potential risk of tolerance and dependence, as well as possible drowsiness. Advised patient not to drive if experiencing drowsiness and to take lowest possible effective dose to minimize risk of dependence and tolerance. ? ?Discussed potential  metabolic side effects associated with atypical antipsychotics, as well as potential risk for movement side effects. Advised pt to contact office if movement side effects occur.   ?There are no diagnoses linked

## 2021-10-25 ENCOUNTER — Ambulatory Visit (INDEPENDENT_AMBULATORY_CARE_PROVIDER_SITE_OTHER): Payer: BC Managed Care – PPO | Admitting: *Deleted

## 2021-10-25 DIAGNOSIS — J309 Allergic rhinitis, unspecified: Secondary | ICD-10-CM

## 2021-11-02 ENCOUNTER — Ambulatory Visit: Payer: BC Managed Care – PPO | Admitting: Psychiatry

## 2021-11-13 ENCOUNTER — Ambulatory Visit (INDEPENDENT_AMBULATORY_CARE_PROVIDER_SITE_OTHER): Payer: BC Managed Care – PPO | Admitting: Psychiatry

## 2021-11-13 DIAGNOSIS — F331 Major depressive disorder, recurrent, moderate: Secondary | ICD-10-CM | POA: Diagnosis not present

## 2021-11-13 NOTE — Progress Notes (Signed)
?    [facility] Counselor/Therapist Progress Note ? ?Patient ID: Laurie Simmons, MRN: [id number],   ? ?Date: 11/13/2021 ? ?Time Spent: 53 minutes  ? ?Treatment Type: Individual Therapy ? ?Reported Symptoms: anxiety "worse" sometimes random and sometimes it's more specific. Depressed.  ? ?Mental Status Exam: ? ?Appearance:   Casual     ?Behavior:  Appropriate, Sharing, and Motivated  ?Motor:  Normal  ?Speech/Language:   Clear and Coherent  ?Affect:  Depressed and anxiety  ?Mood:  anxious and depressed  ?Thought process:  goal directed  ?Thought content:    Overthinking, some ruminating  ?Sensory/Perceptual disturbances:    WNL  ?Orientation:  oriented to person, place, time/date, situation, day of week, month of year, year, and stated date of Nov 13, 2021  ?Attention:  Sometimes good and sometimes I have difficulty paying attention  ?Concentration:  Good and Fair  ?Memory:  Some memory issues "due to my attention not always being good"  ?Fund of knowledge:   Good  ?Insight:    Good  ?Judgment:   Good  ?Impulse Control:  Good  ? ?Risk Assessment: ?Danger to Self:  No ?Self-injurious Behavior: No ?Danger to Others: No ?Duty to Warn:no ?Physical Aggression / Violence:No  ?Access to Firearms a concern: No  ?Gang Involvement:No  ? ?Subjective:  Patient in today and reports "I'm not doing well and not sure what's causing it. Sleeps during the day and not much at night." Needs to get tasks done during the day but end up sleeping more.Tries to get things done in the morning because "that's when I have more energy." Discussed her sleep irregularity and taking naps in the morning and afternoon even though I'm not really sleepy. Also looked at some options for trying to interrupt the cycle of morning/afternoon naps and sleep more regularly at night.  Anxiety has been worse more recently. Had gotten some better with anxiety but now am back to where I used to be in having problems with it. Hard to work with strategies and imagine them  to be helpful. Some assuming the worst and looking for things to go wrong versus right.  Reviewed from last session, the ability to shift her thoughts into "neutral" when things are pending, rather than automatically assuming the worst.  Encouraged patient in more intentionally noticing more positives as she acknowledges she leans more to the negative side versus a more positive side, and worked on this in session today in hopes she'll practice more between sessions. ? ?Interventions: Solution-Oriented/Positive Psychology and Insight-Oriented ?  ?Treatment goal plan: ?Patient not signing treatment plan on computer screen due to [place].  Treatment goals remain on treatment plan as patient works with strategies to try and achieve her goals.  Progress is assessed each session and noted in the "subject" and/or "plan" section of treatment note. ?Long-term goal: ?Develop healthy cognitive patterns and beliefs about self and the world that lead to alleviation and help prevent the relapse of depressive symptoms ?Short-term goal: ?Replace negative self-defeating self-talk with verbalization of realistic and positive cognitive messages. ?Strategies: ?Reinforce positive, reality-based cognitive messages that enhance self-confidence and increase adaptive action. ? ?Diagnosis: ?  ICD-10-CM   ?1. Major depressive disorder, recurrent episode, moderate (HCC)  F33.1   ?  ? ? ?Plan:  Today showing some motivation and did participate in session focusing on her anxiety and depression, and trying to limit her sleeping during the day so as to hopefully sleep better at night.  Encouraged her when she is  able to be around other people and not too isolated and also might keep her interests and other things going in a more positive direction.  Also worked on trying to stop assuming the worst and looking for things to always go wrong versus looking in a more neutral direction and considering that things may actually go well on some occasions.   Some feelings of powerlessness when it comes to making changes "as I have always looked at things this way".  Medically she is feeling more stable right now except for the sleep issues. Encouraged patient in the practice of more positive behaviors daily including staying in the present and focusing on what she can change or control, trying not to make assumptions that are negative and fear inducing, getting outside some daily as she is able, staying in touch with people who are supportive, looking for the positives in every day within herself, remaining on her prescribed medication, healthy nutrition and exercise, stop assuming worst case scenarios, practice more consistent positive self talk, use inspirational books or helpful podcasts that feel supportive to her, consider using daily affirmations that are supportive, stop self negating, reduce overthinking and over analyzing, challenge and counteract her self doubt, allow her faith to be an emotional support as well as spiritual, realize the strength she shows when she works with goal-directed behaviors to move in a direction that supports her improved emotional health. ? ?Goal review and progress/challenges noted with patient. ? ?Next appointment within 3 weeks. ? ?This record has been created using [employer].  Chart creation errors have been sought, but may not always have been located and corrected.  Such creation errors do not reflect on the standard of medical care provided. ? ? ?Shanon Ace, LCSW ? ? ? ? ? ? ? ? ? ? ? ? ? ? ? ? ? ? ?

## 2021-11-14 ENCOUNTER — Ambulatory Visit (INDEPENDENT_AMBULATORY_CARE_PROVIDER_SITE_OTHER): Payer: BC Managed Care – PPO | Admitting: *Deleted

## 2021-11-14 DIAGNOSIS — J3081 Allergic rhinitis due to animal (cat) (dog) hair and dander: Secondary | ICD-10-CM | POA: Diagnosis not present

## 2021-11-14 DIAGNOSIS — J309 Allergic rhinitis, unspecified: Secondary | ICD-10-CM | POA: Diagnosis not present

## 2021-11-14 NOTE — Progress Notes (Signed)
VIALS EXP 11-15-22 ?

## 2021-12-01 DIAGNOSIS — R051 Acute cough: Secondary | ICD-10-CM | POA: Diagnosis not present

## 2021-12-01 DIAGNOSIS — R070 Pain in throat: Secondary | ICD-10-CM | POA: Diagnosis not present

## 2021-12-01 DIAGNOSIS — J069 Acute upper respiratory infection, unspecified: Secondary | ICD-10-CM | POA: Diagnosis not present

## 2021-12-01 DIAGNOSIS — G501 Atypical facial pain: Secondary | ICD-10-CM | POA: Diagnosis not present

## 2021-12-03 ENCOUNTER — Ambulatory Visit: Payer: 59 | Admitting: Psychiatry

## 2021-12-03 DIAGNOSIS — F331 Major depressive disorder, recurrent, moderate: Secondary | ICD-10-CM | POA: Diagnosis not present

## 2021-12-03 NOTE — Progress Notes (Signed)
[facility] Counselor/Therapist Progress Note  Patient ID: Laurie Simmons, MRN: [id number],    Date: 12/03/2021  Time Spent: 55 minutes   Treatment Type: Individual Therapy  Reported Symptoms: "not particularly motivated", not as depressed, "anxious"  Mental Status Exam:  Appearance:   Casual     Behavior:  Appropriate and Sharing  Motor:  Normal  Speech/Language:   Clear and Coherent  Affect:  Anxious and some depression  Mood:  anxious and depressed  Thought process:  goal directed  Thought content:    WNL  Sensory/Perceptual disturbances:    WNL  Orientation:  oriented to person, place, time/date, situation, day of week, month of year, year, and stated date of Dec 03, 2021  Attention:  Fair  Concentration:  Good and Fair  Memory:  Some short term issues and Dr is aware  Fund of knowledge:   Good  Insight:    Good and Fair  Judgment:   Good  Impulse Control:  Good   Risk Assessment: Danger to Self:  No Self-injurious Behavior: No Danger to Others: No Duty to Warn:no Physical Aggression / Violence:No  Access to Firearms a concern: No  Gang Involvement:No   Subjective:  Patient in today reporting. Still some trouble sleeping at night but is not sleeping as much during the day. Continues to have more energy in the mornings and easier to get things done. Energy still lower later in the day. Sleep about the same, but sometimes restless off and on rest of night. "No real changes in my anxiety, maybe a little worse, thinking about things from the past." Mother having some memory issues and have spoken with her doctor, and went with mother to appt and mother is to return for more testing. "Worrying a lot but have improved a little bit". "But don't freak out like I did a year ago." "Wanting more peacefulness and calmness, and I don't want to be overthinking things all the time." Looked at strategies that can help her in efforts to reduce her worrying. Names 2 positives for right  now: her health is improving some and both kids are at home with school out for summer. To work more on motivation between sessions and following through on task, as well as decrease overthinking and excessive worrying.  Interventions: Solution-Oriented/Positive Psychology and Insight-Oriented   Treatment goal plan: Patient not signing treatment plan on computer screen due to [place].  Treatment goals remain on treatment plan as patient works with strategies to try and achieve her goals.  Progress is assessed each session and noted in the "subject" and/or "plan" section of treatment note. Long-term goal: Develop healthy cognitive patterns and beliefs about self and the world that lead to alleviation and help prevent the relapse of depressive symptoms Short-term goal: Replace negative self-defeating self-talk with verbalization of realistic and positive cognitive messages. Strategies: Reinforce positive, reality-based cognitive messages that enhance self-confidence and increase adaptive action.  Diagnosis:   ICD-10-CM   1. Major depressive disorder, recurrent episode, moderate ([place])  F33.1       Plan: Patient today showing good participation but having some difficulty with motivation. Talked freely in session to day re: her anxiety and some depression mostly related to personal and health issues. Continues to limit daytime sleeping to help improve sleep at night. Working further on trying to interrupt her worrying thought patterns and feels she's not obsessing as much about things, but haven't totally stopped the excessive worrying and assuming the worst just  yet, but "at least am working on it." Encouraged patient in practicing more positive behaviors as discussed in session including: Staying in the present and focusing more on what she can change or control versus cannot, trying not to assume that things are always undergoing a negative and fear inducing direction, getting outside some daily as  she is able, staying in touch with people who are supportive, looking for the positives in every day within herself, remaining on her prescribed medication, healthy nutrition and exercise, stop assuming worst case scenarios, practice more consistent positive self talk, use inspirational books or helpful podcast that feel supportive to her, consider using daily affirmations that are supportive, stop self negating, reduce overthinking and over analyzing, challenge and counteract her self doubt, allow her faith to be an emotional support as well as spiritual, and recognize the strength she shows when she works with goal-directed behaviors to move in a direction that supports her improved emotional health and overall wellbeing.  Goal review and progress/challenges noted with patient.  Next appointment within 3 weeks.  This record has been created using [employer].  Chart creation errors have been sought, but may not always have been located and corrected.  Such creation errors do not reflect on the standard of medical care provided.   Shanon Ace, LCSW

## 2021-12-06 ENCOUNTER — Ambulatory Visit (INDEPENDENT_AMBULATORY_CARE_PROVIDER_SITE_OTHER): Payer: BC Managed Care – PPO

## 2021-12-06 DIAGNOSIS — J309 Allergic rhinitis, unspecified: Secondary | ICD-10-CM | POA: Diagnosis not present

## 2021-12-10 ENCOUNTER — Other Ambulatory Visit: Payer: Self-pay | Admitting: Hematology and Oncology

## 2021-12-10 DIAGNOSIS — C50912 Malignant neoplasm of unspecified site of left female breast: Secondary | ICD-10-CM

## 2021-12-26 ENCOUNTER — Ambulatory Visit (INDEPENDENT_AMBULATORY_CARE_PROVIDER_SITE_OTHER): Payer: BC Managed Care – PPO | Admitting: Allergy and Immunology

## 2021-12-26 ENCOUNTER — Encounter: Payer: Self-pay | Admitting: Allergy and Immunology

## 2021-12-26 ENCOUNTER — Other Ambulatory Visit: Payer: Self-pay | Admitting: Adult Health

## 2021-12-26 VITALS — BP 128/96 | HR 104 | Resp 20 | Ht 64.5 in | Wt 246.0 lb

## 2021-12-26 DIAGNOSIS — J4540 Moderate persistent asthma, uncomplicated: Secondary | ICD-10-CM

## 2021-12-26 DIAGNOSIS — K219 Gastro-esophageal reflux disease without esophagitis: Secondary | ICD-10-CM

## 2021-12-26 DIAGNOSIS — J309 Allergic rhinitis, unspecified: Secondary | ICD-10-CM | POA: Diagnosis not present

## 2021-12-26 DIAGNOSIS — J3089 Other allergic rhinitis: Secondary | ICD-10-CM

## 2021-12-26 MED ORDER — EPINEPHRINE 0.3 MG/0.3ML IJ SOAJ: INTRAMUSCULAR | 3 refills | Status: AC

## 2021-12-26 NOTE — Patient Instructions (Addendum)
  1. Continue Advair 250 - 1 inhalations 1-2 times per day    2. Continue Flonase - 1 spray each nostril 3-7 times per week  3.  Continue famotidine 40 mg -1 tablet 2 times per day  4. Continue immunotherapy and EpiPen  5. If needed:   A. Ventolin  B. OTC antihistamime  C. Nasal saline  D. Azelastine 1 spray each nostril 2 times per day  6. Return to clinic in 6 months or earlier if problem  7. Obtain fall flu vaccine

## 2021-12-26 NOTE — Progress Notes (Signed)
[place] [facility] [place] [facility] [place]   Follow-up Note  Referring Provider: Marco Collie, MD Primary Provider: Marco Collie, MD Date of Office Visit: 12/26/2021  Subjective:   Laurie Simmons (DOB: 08-May-1966) is a 56 y.o. female who returns to the [facility] on 12/26/2021 in re-evaluation of the following:  HPI: Tracina returns to this clinic in evaluation of asthma, allergic rhinoconjunctivitis, and reflux.  Her last visit to this clinic was 27 June 2021.  She apparently contracted COVID at the end of May 2023 that was manifested as upper airway symptoms of about 2 weeks duration.  She actually tested negative for COVID but her husband was at home at the exact same time with COVID infection.  She was given Z-Pak and prednisone.  Other than that event she has really done very well and has not had any significant upper or lower airway symptoms and rarely uses a short acting bronchodilator while utilizing a combination of Advair once a day, Flonase intermittently and continues on immunotherapy currently every 3 weeks.  Her reflux is doing relatively well.  Because of the kidney issue she is staying away from proton pump inhibitors and she is using famotidine twice a day which she thinks is working well.  Allergies as of 12/26/2021       Reactions   Effexor [venlafaxine] Palpitations   Nsaids Other (See Comments)   Pt reports kidney disease Other reaction(s): Other (See Comments) Pt reports kidney disease Pt reports kidney disease        Medication List    Advair Diskus 250-50 MCG/ACT Aepb Generic drug: fluticasone-salmeterol INHALE 1 PUFF BY MOUTH TWICE DAILY TO PREVENT COUGH OR WHEEZE. RINSE MOUTH AFTER USE   albuterol 108 (90 Base) MCG/ACT inhaler Commonly known as: VENTOLIN HFA INHALE 2 PUFFS INTO THE LUNGS EVERY 4 TO 6 HOURS AS NEEDED FOR COUGH OR WHEEZING   anastrozole 1 MG tablet Commonly known as: ARIMIDEX TAKE 1 TABLET(1 MG) BY  MOUTH DAILY   ARIPiprazole 2 MG tablet Commonly known as: Abilify Take 1 tablet (2 mg total) by mouth daily.   Azelastine HCl 0.15 % Soln Can use one spray in each nostril two times daily if needed.   clorazepate 3.75 MG tablet Commonly known as: TRANXENE Take one tablet up to four times daily as needed for anxiety/panic attacks.   docusate sodium 50 MG capsule Commonly known as: COLACE Take 50 mg by mouth 2 (two) times daily.   famotidine 40 MG tablet Commonly known as: PEPCID TAKE 1 TABLET BY MOUTH TWICE DAILY AS DIRECTED   folic acid 1 MG tablet Commonly known as: FOLVITE TAKE 1 TABLET BY MOUTH DAILY   loratadine 10 MG tablet Commonly known as: CLARITIN Take 1 tablet (10 mg total) by mouth daily.   ondansetron 4 MG tablet Commonly known as: ZOFRAN TAKE 1 TABLET BY MOUTH EVERY 4 HOURS AS NEEDED FOR NAUSEA   Ozempic (2 MG/DOSE) 8 MG/3ML Sopn Generic drug: Semaglutide (2 MG/DOSE) Inject 2 mg into the skin once a week.   potassium chloride 10 MEQ tablet Commonly known as: KLOR-CON TAKE 1 TABLET BY MOUTH TWICE DAILY   prochlorperazine 10 MG tablet Commonly known as: COMPAZINE TAKE 1 TABLET BY MOUTH EVERY 6 HOURS AS NEEDED FOR NAUSEA   rosuvastatin 10 MG tablet Commonly known as: CRESTOR Take 10 mg by mouth. Takes twice a week   topiramate 50 MG tablet Commonly known as: TOPAMAX Take 1 tablet (50 mg  total) by mouth daily.   UNABLE TO FIND Allergy injections for cats, grass, and dust.   valsartan-hydrochlorothiazide 320-25 MG tablet Commonly known as: DIOVAN-HCT Take 1 tablet by mouth daily.   Vilazodone HCl 40 MG Tabs Commonly known as: VIIBRYD Take 1 tablet (40 mg total) by mouth daily.    Past Medical History:  Diagnosis Date   Anxiety    Arthritis    Asthma    Breast cancer ([place]) 2021   Carpal tunnel syndrome    Carpal tunnel syndrome    bilateral   Chronic kidney disease    stage 3   Depression    Diabetes (HCC)    GERD (gastroesophageal  reflux disease)    Hypertension    Irritable bowel syndrome    Severe anemia [id number]    Past Surgical History:  Procedure Laterality Date   caesarean     CHOLECYSTECTOMY     DILATION AND CURETTAGE OF UTERUS     HIP SURGERY Left    PORT A CATH INJECTION ([place] [facility])  2021    Review of systems negative except as noted in HPI / PMHx or noted below:  Review of Systems  Constitutional: Negative.   HENT: Negative.    Eyes: Negative.   Respiratory: Negative.    Cardiovascular: Negative.   Gastrointestinal: Negative.   Genitourinary: Negative.   Musculoskeletal: Negative.   Skin: Negative.   Neurological: Negative.   Endo/Heme/Allergies: Negative.   Psychiatric/Behavioral: Negative.       Objective:   Vitals:   12/26/21 0845 12/26/21 0904  BP: (!) 146/102 (!) 128/96  Pulse: (!) 104   Resp: 20   SpO2: 97%    Height: 5' 4.5" (163.8 cm)  Weight: 246 lb (111.6 kg)   Physical Exam Constitutional:      Appearance: She is not diaphoretic.  HENT:     Head: Normocephalic.     Right Ear: Tympanic membrane, ear canal and external ear normal.     Left Ear: Tympanic membrane, ear canal and external ear normal.     Nose: Nose normal. No mucosal edema or rhinorrhea.     Mouth/Throat:     Pharynx: Uvula midline. No oropharyngeal exudate.  Eyes:     Conjunctiva/sclera: Conjunctivae normal.  Neck:     Thyroid: No thyromegaly.     Trachea: Trachea normal. No tracheal tenderness or tracheal deviation.  Cardiovascular:     Rate and Rhythm: Normal rate and regular rhythm.     Heart sounds: Normal heart sounds, S1 normal and S2 normal. No murmur heard. Pulmonary:     Effort: No respiratory distress.     Breath sounds: Normal breath sounds. No stridor. No wheezing or rales.  Lymphadenopathy:     Head:     Right side of head: No tonsillar adenopathy.     Left side of head: No tonsillar adenopathy.     Cervical: No cervical adenopathy.  Skin:    Findings: No erythema or rash.      Nails: There is no clubbing.  Neurological:     Mental Status: She is alert.     Diagnostics:    Spirometry was performed and demonstrated an FEV1 of 2.67 at 100 % of predicted.  Assessment and Plan:   1. Asthma, moderate persistent, well-controlled   2. Other allergic rhinitis   3. LPRD (laryngopharyngeal reflux disease)   4. Allergic rhinitis, unspecified seasonality, unspecified trigger      1. Continue Advair 250 - 1 inhalations 1-2 times  per day    2. Continue Flonase - 1 spray each nostril 3-7 times per week  3.  Continue famotidine 40 mg -1 tablet 2 times per day  4. Continue immunotherapy and EpiPen  5. If needed:   A. Ventolin  B. OTC antihistamime  C. Nasal saline  D. Azelastine 1 spray each nostril 2 times per day  6. Return to clinic in 6 months or earlier if problem  7. Obtain fall flu vaccine   Gearldean has really done very well on her current therapy which includes immunotherapy and anti-inflammatory agents for both her upper and lower airway and therapy directed against reflux.  She will continue on this plan and I will see her back in this clinic in 6 months or earlier if there is a problem.   Allena Katz, MD Allergy / Immunology [place]

## 2021-12-27 ENCOUNTER — Encounter: Payer: Self-pay | Admitting: Allergy and Immunology

## 2022-01-01 ENCOUNTER — Ambulatory Visit (INDEPENDENT_AMBULATORY_CARE_PROVIDER_SITE_OTHER): Payer: 59 | Admitting: Psychiatry

## 2022-01-01 DIAGNOSIS — F331 Major depressive disorder, recurrent, moderate: Secondary | ICD-10-CM | POA: Diagnosis not present

## 2022-01-01 NOTE — Progress Notes (Signed)
[facility] Counselor/Therapist Progress Note  Patient ID: Laurie Simmons, MRN: [id number],    Date: 01/01/2022  Time Spent: 55 minutes   Treatment Type: Individual Therapy  Reported Symptoms:  anxiety   Mental Status Exam:  Appearance:   Casual     Behavior:  Appropriate, Sharing, and Motivated  Motor:  Normal  Speech/Language:   Clear and Coherent  Affect:  anxious  Mood:  anxious  Thought process:  goal directed  Thought content:    Rumination  Sensory/Perceptual disturbances:    WNL  Orientation:  oriented to person, place, time/date, situation, day of week, month of year, year, and stated date of January 01, 2022  Attention:  Good  Concentration:  Fair  Memory:  Fox of knowledge:   Good  Insight:    Good and Fair  Judgment:   Good  Impulse Control:  Good   Risk Assessment: Danger to Self:  No Self-injurious Behavior: No Danger to Others: No Duty to Warn:no Physical Aggression / Violence:No  Access to Firearms a concern: No  Gang Involvement:No   Subjective:  Patient in today reporting "anxiety and overly focusing" on things. To work on setting limits with things that she know upsets her, but has difficult times not exposing herself to it. Sleep is about the same, somewhat erratic, and usually takes a nap in afternoon. Some difficulty with 54 yr old daughter "who is very bull-headed and is always angry about something." Patient feeling hesitant and like she doesn't have much control with daughter's behavior.  Looked at different ways of responding to daughter and setting some limits but also not being punitive with her daughter and could actually help daughter develop some better ways of responding.  Interventions: Solution-Oriented/Positive Psychology and Insight-Oriented  Treatment goal plan: Patient not signing treatment plan on computer screen due to [place].  Treatment goals remain on treatment plan as patient works with strategies to try and achieve her  goals.  Progress is assessed each session and noted in the "subject" and/or "plan" section of treatment note. Long-term goal: Develop healthy cognitive patterns and beliefs about self and the world that lead to alleviation and help prevent the relapse of depressive symptoms Short-term goal: Replace negative self-defeating self-talk with verbalization of realistic and positive cognitive messages. Strategies: Reinforce positive, reality-based cognitive messages that enhance self-confidence and increase adaptive action.   Diagnosis:   ICD-10-CM   1. Major depressive disorder, recurrent episode, moderate (HCC)  F33.1       Plan: Today showing good participation and motivation today in session as she used goal directed behaviors and working on having more positive and less defeating self talk, recognizing her strengths, and being willing to test out those strengths in relationship to others.  Also working to let go of negatives from the past and stop assuming negatives in the future.  Showing some improved motivation this week.  To continue limiting daytime sleeping to help her sleep better at night.  Continue interrupting negative thought patterns or cycles of worry, that tend to lead her to obsess about things, and practice allowing more positive thoughts and looking for things to go in a positive way versus assuming the negative.  She adds that this is a long time habit and has tended to imagine "the worst" but also willing to try to interrupt this of things now and into the future.  We will follow up on this next session. Encouraged patient in her practice of more  positive behaviors including: Staying in the present and focusing more on what she can control or change versus cannot, trying not to assume that things are always going in a negative and fear inducing direction, getting outside some daily as she is able, staying in touch with people who are supportive, remaining on her prescribed medication,  healthy nutrition and exercise, trying to not assume worst case scenarios, practice more consistent positive self talk, use inspirational books or helpful podcast that feel supportive to her, reduce overthinking and over analyzing, challenge and counteract her self doubt, allow her faith to be an emotional support as well as spiritual, and realize the strength she shows when working with goal directed behaviors to move in a direction that supports her improved emotional health. .   Goal review and progress/challenges noted with patient.  Next appointment within 3 weeks.  This record has been created using [employer].  Chart creation errors have been sought, but may not always have been located and corrected.  Such creation errors do not reflect on the standard of medical care provided.   Shanon Ace, LCSW

## 2022-01-05 ENCOUNTER — Other Ambulatory Visit: Payer: Self-pay | Admitting: Oncology

## 2022-01-22 ENCOUNTER — Ambulatory Visit (INDEPENDENT_AMBULATORY_CARE_PROVIDER_SITE_OTHER): Payer: Medicare PPO | Admitting: Adult Health

## 2022-01-22 ENCOUNTER — Encounter: Payer: Self-pay | Admitting: Adult Health

## 2022-01-22 DIAGNOSIS — F39 Unspecified mood [affective] disorder: Secondary | ICD-10-CM | POA: Diagnosis not present

## 2022-01-22 DIAGNOSIS — F411 Generalized anxiety disorder: Secondary | ICD-10-CM

## 2022-01-22 DIAGNOSIS — F410 Panic disorder [episodic paroxysmal anxiety] without agoraphobia: Secondary | ICD-10-CM | POA: Diagnosis not present

## 2022-01-22 DIAGNOSIS — F331 Major depressive disorder, recurrent, moderate: Secondary | ICD-10-CM | POA: Diagnosis not present

## 2022-01-22 MED ORDER — VILAZODONE HCL 40 MG PO TABS
ORAL_TABLET | ORAL | 1 refills | Status: DC
Start: 2022-01-22 — End: 2022-03-25

## 2022-01-22 MED ORDER — CLORAZEPATE DIPOTASSIUM 3.75 MG PO TABS
ORAL_TABLET | ORAL | 2 refills | Status: DC
Start: 2022-01-22 — End: 2022-05-24

## 2022-01-22 NOTE — Progress Notes (Signed)
Laurie Simmons [id number] 23-Nov-1965 56 y.o.  Subjective:   Patient ID:  Laurie Simmons is a 56 y.o. (DOB 02-Jun-1966) female.  Chief Complaint: No chief complaint on file.   HPI Laurie Simmons presents to the office today for follow-up of anxiety, panic attacks, depression, and mood disorder.  Describes mood today as "so-so". Pleasant. Tearful at times. Mood symptoms - reports increased anxiety.  Decreased depression and irritability. Reports over thinking. Reports worry and rumination. Denies panic attacks - "min attacks". Mood is consistent - having some down days - no mood swings. Feels like current medications are helpful. Working with a Transport planner. Stable interest and motivation. Taking medications as prescribed. Energy levels lower. Active, does not have a regular exercise routine with current physical disabilities.  Enjoys some usual interests and activities. Married. Lives with husband and their two children. Mostly staying home. Appetite adequate. Weight loss - 5 pounds - 240 pounds. Working with weight loss management - Ozempic. Sleeps better some nights than others. Averages 7 hours of broken sleep. Napping during the day - afternoons. Focus and concentration difficulties. Getting some things done around the house. Completing tasks. Currently disabled and receiving SSI benefits. Denies SI or HI.  Denies AH or VH.   Reports post Covid symptoms.     Review of Systems:  Review of Systems  Musculoskeletal:  Negative for gait problem.  Neurological:  Negative for tremors.  Psychiatric/Behavioral:         Please refer to HPI    Medications: I have reviewed the patient's current medications.  Current Outpatient Medications  Medication Sig Dispense Refill   albuterol (VENTOLIN HFA) 108 (90 Base) MCG/ACT inhaler INHALE 2 PUFFS INTO THE LUNGS EVERY 4 TO 6 HOURS AS NEEDED FOR COUGH OR WHEEZING 18 g 1   anastrozole (ARIMIDEX) 1 MG tablet TAKE 1 TABLET(1 MG) BY MOUTH DAILY 90 tablet 3    ARIPiprazole (ABILIFY) 2 MG tablet Take 1 tablet (2 mg total) by mouth daily. 90 tablet 3   Azelastine HCl 0.15 % SOLN Can use one spray in each nostril two times daily if needed. 90 mL 1   clorazepate (TRANXENE) 3.75 MG tablet Take one tablet up to four times daily as needed for anxiety/panic attacks. 120 tablet 2   docusate sodium (COLACE) 50 MG capsule Take 50 mg by mouth 2 (two) times daily.     EPINEPHrine (EPIPEN 2-PAK) 0.3 mg/0.3 mL IJ SOAJ injection Use as directed for life-threatening allergic reaction. 1 each 3   famotidine (PEPCID) 40 MG tablet TAKE 1 TABLET BY MOUTH TWICE DAILY AS DIRECTED 60 tablet 5   fluticasone-salmeterol (ADVAIR DISKUS) 250-50 MCG/ACT AEPB INHALE 1 PUFF BY MOUTH TWICE DAILY TO PREVENT COUGH OR WHEEZE. RINSE MOUTH AFTER USE 60 each 4   folic acid (FOLVITE) 1 MG tablet TAKE 1 TABLET BY MOUTH DAILY 30 tablet 5   loratadine (CLARITIN) 10 MG tablet Take 1 tablet (10 mg total) by mouth daily. 90 tablet 3   ondansetron (ZOFRAN) 4 MG tablet TAKE 1 TABLET BY MOUTH EVERY 4 HOURS AS NEEDED FOR NAUSEA 30 tablet 0   OZEMPIC, 2 MG/DOSE, 8 MG/3ML SOPN Inject 2 mg into the skin once a week.     potassium chloride (KLOR-CON) 10 MEQ tablet TAKE 1 TABLET BY MOUTH TWICE DAILY 60 tablet 1   prochlorperazine (COMPAZINE) 10 MG tablet TAKE 1 TABLET BY MOUTH EVERY 6 HOURS AS NEEDED FOR NAUSEA 90 tablet 1   rosuvastatin (CRESTOR) 10 MG tablet Take  10 mg by mouth. Takes twice a week  1   topiramate (TOPAMAX) 50 MG tablet Take 1 tablet (50 mg total) by mouth daily. 90 tablet 3   UNABLE TO FIND Allergy injections for cats, grass, and dust.     valsartan-hydrochlorothiazide (DIOVAN-HCT) 320-25 MG tablet Take 1 tablet by mouth daily.  0   Vilazodone HCl (VIIBRYD) 40 MG TABS TAKE 1 TABLET(40 MG) BY MOUTH DAILY 90 tablet 1   No current facility-administered medications for this visit.    Medication Side Effects: None  Allergies:  Allergies  Allergen Reactions   Effexor [Venlafaxine]  Palpitations   Nsaids Other (See Comments)    Pt reports kidney disease Other reaction(s): Other (See Comments) Pt reports kidney disease Pt reports kidney disease    Past Medical History:  Diagnosis Date   Anxiety    Arthritis    Asthma    Breast cancer ([place]) 2021   Carpal tunnel syndrome    Carpal tunnel syndrome    bilateral   Chronic kidney disease    stage 3   Depression    Diabetes (HCC)    GERD (gastroesophageal reflux disease)    Hypertension    Irritable bowel syndrome    Severe anemia [id number]    Past Medical History, Surgical history, Social history, and Family history were reviewed and updated as appropriate.   Please see review of systems for further details on the patient's review from today.   Objective:   Physical Exam:  There were no vitals taken for this visit.  Physical Exam Constitutional:      General: She is not in acute distress. Musculoskeletal:        General: No deformity.  Neurological:     Mental Status: She is alert and oriented to person, place, and time.     Coordination: Coordination normal.  Psychiatric:        Attention and Perception: Attention and perception normal. She does not perceive auditory or visual hallucinations.        Mood and Affect: Mood normal. Mood is not anxious or depressed. Affect is not labile, blunt, angry or inappropriate.        Speech: Speech normal.        Behavior: Behavior normal.        Thought Content: Thought content normal. Thought content is not paranoid or delusional. Thought content does not include homicidal or suicidal ideation. Thought content does not include homicidal or suicidal plan.        Cognition and Memory: Cognition and memory normal.        Judgment: Judgment normal.     Comments: Insight intact     Lab Review:     Component Value Date/Time   NA 138 08/30/2021 0000   K 3.5 08/30/2021 0000   CL 104 08/30/2021 0000   CO2 25 (A) 08/30/2021 0000   GLUCOSE 97 10/21/2008 0545    BUN 23 (A) 08/30/2021 0000   CREATININE 1.3 (A) 08/30/2021 0000   CREATININE 0.76 10/20/2008 2105   CALCIUM 9.1 08/30/2021 0000   PROT 6.0 10/20/2008 2105   ALBUMIN 4.1 08/30/2021 0000   AST 20 08/30/2021 0000   ALT 17 08/30/2021 0000   ALKPHOS 94 08/30/2021 0000   BILITOT 0.2 (L) 10/20/2008 2105   GFRNONAA >60 10/20/2008 2105   GFRAA  10/20/2008 2105    >60        The eGFR has been calculated using the MDRD equation. This calculation has not been  validated in all clinical situations. eGFR's persistently <60 mL/min signify possible Chronic Kidney Disease.       Component Value Date/Time   WBC 7.4 08/30/2021 0000   WBC 12.3 (H) 10/22/2008 0530   RBC 4.92 08/30/2021 0000   HGB 14.1 08/30/2021 0000   HGB 11.6 12/23/2018 1146   HCT 42 08/30/2021 0000   HCT 36.4 12/23/2018 1146   PLT 296 08/30/2021 0000   MCV 87 11/07/2020 0000   MCH 22.3 (L) 12/23/2018 1146   MCHC 31.9 12/23/2018 1146   MCHC 33.7 10/22/2008 0530   RDW 16.9 (H) 12/23/2018 1146   LYMPHSABS 2.8 12/23/2018 1146   EOSABS 0.2 12/23/2018 1146   BASOSABS 0.1 12/23/2018 1146    No results found for: "POCLITH", "LITHIUM"   No results found for: "PHENYTOIN", "PHENOBARB", "VALPROATE", "CBMZ"   .res Assessment: Plan:    Plan:  1. Viibryd 45m daily 2. Tranxene 3.767mup to 4 times daily 3. Topamax 50 mg daily 4. Abilify 46m64maily  RTC 4 months  Patient advised to contact office with any questions, adverse effects, or acute worsening in signs and symptoms.  Discussed potential benefits, risk, and side effects of benzodiazepines to include potential risk of tolerance and dependence, as well as possible drowsiness. Advised patient not to drive if experiencing drowsiness and to take lowest possible effective dose to minimize risk of dependence and tolerance.  Discussed potential metabolic side effects associated with atypical antipsychotics, as well as potential risk for movement side effects. Advised pt to  contact office if movement side effects occur.   Diagnoses and all orders for this visit:  Major depressive disorder, recurrent episode, moderate (HCC)  Generalized anxiety disorder -     Vilazodone HCl (VIIBRYD) 40 MG TABS; TAKE 1 TABLET(40 MG) BY MOUTH DAILY -     clorazepate (TRANXENE) 3.75 MG tablet; Take one tablet up to four times daily as needed for anxiety/panic attacks.  Panic attacks -     clorazepate (TRANXENE) 3.75 MG tablet; Take one tablet up to four times daily as needed for anxiety/panic attacks.  [place]odic mood [place]   Please see After Visit Summary for patient specific instructions.  Future Appointments  Date Time Provider DepNavasota/06/2022  2:30 PM [facility] None  01/23/2022  3:00 PM ParDayton Scrape NP [facility] None  01/29/2022 10:00 AM DowShanon AceCSW [facility] None  02/26/2022 10:00 AM DowShanon AceCSW [facility] None  05/24/2022  9:40 AM Jamail Cullers, RegBerdie OgrenP [facility] None  06/27/2022  8:30 AM Kozlow, EriDonnamarie PoagD [facility] None    No orders of the defined types were placed in this encounter.   -------------------------------

## 2022-01-23 ENCOUNTER — Inpatient Hospital Stay: Payer: Medicare PPO

## 2022-01-23 ENCOUNTER — Encounter: Payer: Self-pay | Admitting: Hematology and Oncology

## 2022-01-23 ENCOUNTER — Inpatient Hospital Stay: Payer: Medicare PPO | Attending: Hematology and Oncology | Admitting: Hematology and Oncology

## 2022-01-23 ENCOUNTER — Other Ambulatory Visit: Payer: Self-pay | Admitting: Hematology and Oncology

## 2022-01-23 ENCOUNTER — Ambulatory Visit (INDEPENDENT_AMBULATORY_CARE_PROVIDER_SITE_OTHER): Payer: BC Managed Care – PPO | Admitting: *Deleted

## 2022-01-23 DIAGNOSIS — C773 Secondary and unspecified malignant neoplasm of axilla and upper limb lymph nodes: Secondary | ICD-10-CM | POA: Diagnosis not present

## 2022-01-23 DIAGNOSIS — J309 Allergic rhinitis, unspecified: Secondary | ICD-10-CM | POA: Diagnosis not present

## 2022-01-23 DIAGNOSIS — D500 Iron deficiency anemia secondary to blood loss (chronic): Secondary | ICD-10-CM | POA: Diagnosis not present

## 2022-01-23 LAB — COMPREHENSIVE METABOLIC PANEL
Albumin: 4.3 (ref 3.5–5.0)
Calcium: 9.7 (ref 8.7–10.7)

## 2022-01-23 LAB — BASIC METABOLIC PANEL
BUN: 19 (ref 4–21)
CO2: 29 — AB (ref 13–22)
Chloride: 102 (ref 99–108)
Creatinine: 1.6 — AB (ref 0.5–1.1)
Glucose: 97
Potassium: 3.7 mEq/L (ref 3.5–5.1)
Sodium: 138 (ref 137–147)

## 2022-01-23 LAB — HEPATIC FUNCTION PANEL
ALT: 21 U/L (ref 7–35)
AST: 23 (ref 13–35)
Alkaline Phosphatase: 95 (ref 25–125)
Bilirubin, Total: 0.5

## 2022-01-23 LAB — CBC: RBC: 5.03 (ref 3.87–5.11)

## 2022-01-23 LAB — CBC AND DIFFERENTIAL
HCT: 43 (ref 36–46)
Hemoglobin: 14.3 (ref 12.0–16.0)
Neutrophils Absolute: 5.82
Platelets: 331 10*3/uL (ref 150–400)
WBC: 9.1

## 2022-01-23 MED ORDER — GABAPENTIN 300 MG PO CAPS: 300.0000 mg | ORAL_CAPSULE | Freq: Two times a day (BID) | ORAL | 3 refills | Status: DC

## 2022-01-23 NOTE — Assessment & Plan Note (Signed)
Clinical stage IIB (TXN2M0), grade 3, ductal carcinoma with no breast primary, diagnosed in February 2021.  She received neoadjuvant chemotherapy with docetaxel/doxorubicin/cyclophosphamide completed in July.  She underwent left mastectomy and axillary dissection, with no breast primary found and 9/26 lymph nodes positive for metastasis. She completed adjuvant radiation at the end of November, and was placed on hormonal therapy with tamoxifen at the end of December. Baseline bone density scan was completely normal. She was unable to tolerate tamoxifen and was switched to anastrazole. She is tolerating this well with minimal side effects, mainly muscle and joint pain and fatigue. She is also still recovering from a bout with COVID back in May and has noted an increase in her heart rate. She has an appointment with her PCP tomorrow. She will return to clinic in 4 months with repeat evaluation.

## 2022-01-23 NOTE — Progress Notes (Signed)
Patient Care Team: Marco Collie, MD as PCP - General (Family Medicine) [name], MD as Obstetrician (Obstetrics and Gynecology) [name], MD as Referring Physician (Surgery) Derwood Kaplan, MD as Consulting Physician (Oncology) Gatha Mayer, MD as Consulting Physician (Radiation Oncology)  Clinic Day:  01/23/2022  Referring physician: Marco Collie, MD  ASSESSMENT & PLAN:   Assessment & Plan: Secondary malignant neoplasm of axillary lymph nodes [facility])  Clinical stage IIB (TXN2M0), grade 3, ductal carcinoma with no breast primary, diagnosed in February 2021.  She received neoadjuvant chemotherapy with docetaxel/doxorubicin/cyclophosphamide completed in July.  She underwent left mastectomy and axillary dissection, with no breast primary found and 9/26 lymph nodes positive for metastasis. She completed adjuvant radiation at the end of November, and was placed on hormonal therapy with tamoxifen at the end of December. Baseline bone density scan was completely normal. She was unable to tolerate tamoxifen and was switched to anastrazole. She is tolerating this well with minimal side effects, mainly muscle and joint pain and fatigue. She is also still recovering from a bout with COVID back in May and has noted an increase in her heart rate. She has an appointment with her PCP tomorrow. She will return to clinic in 4 months with repeat evaluation.   The patient understands the plans discussed today and is in agreement with them.  She knows to contact our office if she develops concerns prior to her next appointment.    Melodye Ped, NP  [facility] [address] [place] [address] Dept: [phone] Dept Fax: [phone]   Orders Placed This Encounter  Procedures   CBC and differential    This external order was created through the Results Console.   CBC    This external order was created  through the Results Console.   Basic metabolic panel    This external order was created through the Results Console.   Comprehensive metabolic panel    This external order was created through the Results Console.   Hepatic function panel    This external order was created through the Results Console.      CHIEF COMPLAINT:  CC: A 56 year old female with history of breast cancer here for 4 month evaluation.   Current Treatment:  Anastrazole  INTERVAL HISTORY:  Kathleen is here today for repeat clinical assessment. She denies fevers or chills. She denies pain. Her appetite is good. Her weight has been stable.  I have reviewed the past medical history, past surgical history, social history and family history with the patient and they are unchanged from previous note.  ALLERGIES:  is allergic to effexor [venlafaxine] and nsaids.  MEDICATIONS:  Current Outpatient Medications  Medication Sig Dispense Refill   gabapentin (NEURONTIN) 300 MG capsule Take 1 capsule (300 mg total) by mouth 2 (two) times daily. 60 capsule 3   albuterol (VENTOLIN HFA) 108 (90 Base) MCG/ACT inhaler INHALE 2 PUFFS INTO THE LUNGS EVERY 4 TO 6 HOURS AS NEEDED FOR COUGH OR WHEEZING 18 g 1   anastrozole (ARIMIDEX) 1 MG tablet TAKE 1 TABLET(1 MG) BY MOUTH DAILY 90 tablet 3   ARIPiprazole (ABILIFY) 2 MG tablet Take 1 tablet (2 mg total) by mouth daily. 90 tablet 3   Azelastine HCl 0.15 % SOLN Can use one spray in each nostril two times daily if needed. 90 mL 1   clorazepate (TRANXENE) 3.75 MG tablet Take one tablet up to four times daily  as needed for anxiety/panic attacks. 120 tablet 2   docusate sodium (COLACE) 50 MG capsule Take 50 mg by mouth 2 (two) times daily.     EPINEPHrine (EPIPEN 2-PAK) 0.3 mg/0.3 mL IJ SOAJ injection Use as directed for life-threatening allergic reaction. 1 each 3   famotidine (PEPCID) 40 MG tablet TAKE 1 TABLET BY MOUTH TWICE DAILY AS DIRECTED 60 tablet 5   fluticasone-salmeterol (ADVAIR DISKUS)  250-50 MCG/ACT AEPB INHALE 1 PUFF BY MOUTH TWICE DAILY TO PREVENT COUGH OR WHEEZE. RINSE MOUTH AFTER USE 60 each 4   folic acid (FOLVITE) 1 MG tablet TAKE 1 TABLET BY MOUTH DAILY 30 tablet 5   loratadine (CLARITIN) 10 MG tablet Take 1 tablet (10 mg total) by mouth daily. 90 tablet 3   ondansetron (ZOFRAN) 4 MG tablet TAKE 1 TABLET BY MOUTH EVERY 4 HOURS AS NEEDED FOR NAUSEA 30 tablet 0   OZEMPIC, 2 MG/DOSE, 8 MG/3ML SOPN Inject 2 mg into the skin once a week.     potassium chloride (KLOR-CON) 10 MEQ tablet TAKE 1 TABLET BY MOUTH TWICE DAILY 60 tablet 1   prochlorperazine (COMPAZINE) 10 MG tablet TAKE 1 TABLET BY MOUTH EVERY 6 HOURS AS NEEDED FOR NAUSEA 90 tablet 1   rosuvastatin (CRESTOR) 10 MG tablet Take 10 mg by mouth. Takes twice a week  1   topiramate (TOPAMAX) 50 MG tablet Take 1 tablet (50 mg total) by mouth daily. 90 tablet 3   UNABLE TO FIND Allergy injections for cats, grass, and dust.     valsartan-hydrochlorothiazide (DIOVAN-HCT) 320-25 MG tablet Take 1 tablet by mouth daily.  0   Vilazodone HCl (VIIBRYD) 40 MG TABS TAKE 1 TABLET(40 MG) BY MOUTH DAILY 90 tablet 1   No current facility-administered medications for this visit.    HISTORY OF PRESENT ILLNESS:   Oncology History  Secondary malignant neoplasm of axillary lymph nodes ([place])  08/16/2019 Cancer Staging   Staging form: Breast, AJCC 8th Edition - Clinical stage from 08/16/2019: Stage IIIA (cT0, cN2(f), cM0, G3, ER+, PR+, HER2-) - Signed by Derwood Kaplan, MD on 06/28/2020 Staging comments: TAC x 6 as neoadjuvant chemo   08/23/2019 Initial Diagnosis   Secondary malignant neoplasm of axillary lymph nodes ([place])   03/15/2020 Cancer Staging   Staging form: Breast, AJCC 8th Edition - Pathologic stage from 03/15/2020: No Stage Recommended (ypT0, pN2a, cM0, G3, ER+, PR+, HER2-) - Signed by Derwood Kaplan, MD on 06/28/2020 Staging comments: Mastectomy with no primary found, postop radiation, then hormonal Rx       REVIEW  OF SYSTEMS:   Constitutional: Denies fevers, chills or abnormal weight loss Eyes: Denies blurriness of vision Ears, nose, mouth, throat, and face: Denies mucositis or sore throat Respiratory: Denies cough, dyspnea or wheezes Cardiovascular: Denies palpitation, chest discomfort or lower extremity swelling Gastrointestinal:  Denies nausea, heartburn or change in bowel habits Skin: Denies abnormal skin rashes Lymphatics: Denies new lymphadenopathy or easy bruising Neurological:Denies numbness, tingling or new weaknesses Behavioral/Psych: Mood is stable, no new changes  All other systems were reviewed with the patient and are negative.   VITALS:  Blood pressure (!) 137/98, pulse (!) 106, temperature 99.5 F (37.5 C), temperature source Oral, resp. rate 18, height 5' 4.5" (1.638 m), weight 246 lb 4.8 oz (111.7 kg), SpO2 98 %.  Wt Readings from Last 3 Encounters:  01/23/22 246 lb 4.8 oz (111.7 kg)  12/26/21 246 lb (111.6 kg)  09/14/21 248 lb 1.6 oz (112.5 kg)    Body  mass index is 41.62 kg/m.  Performance status (ECOG): 1 - Symptomatic but completely ambulatory  PHYSICAL EXAM:   GENERAL:alert, no distress and comfortable SKIN: skin color, texture, turgor are normal, no rashes or significant lesions EYES: normal, Conjunctiva are pink and non-injected, sclera clear OROPHARYNX:no exudate, no erythema and lips, buccal mucosa, and tongue normal  NECK: supple, thyroid normal size, non-tender, without nodularity LYMPH:  no palpable lymphadenopathy in the cervical, axillary or inguinal LUNGS: clear to auscultation and percussion with normal breathing effort HEART: regular rate & rhythm and no murmurs and no lower extremity edema ABDOMEN:abdomen soft, non-tender and normal bowel sounds Musculoskeletal:no cyanosis of digits and no clubbing  NEURO: alert & oriented x 3 with fluent speech, no focal motor/sensory deficits  LABORATORY DATA:  I have reviewed the data as listed    Component  Value Date/Time   NA 138 01/23/2022 0000   K 3.7 01/23/2022 0000   CL 102 01/23/2022 0000   CO2 29 (A) 01/23/2022 0000   GLUCOSE 97 10/21/2008 0545   BUN 19 01/23/2022 0000   CREATININE 1.6 (A) 01/23/2022 0000   CREATININE 0.76 10/20/2008 2105   CALCIUM 9.7 01/23/2022 0000   PROT 6.0 10/20/2008 2105   ALBUMIN 4.3 01/23/2022 0000   AST 23 01/23/2022 0000   ALT 21 01/23/2022 0000   ALKPHOS 95 01/23/2022 0000   BILITOT 0.2 (L) 10/20/2008 2105   GFRNONAA >60 10/20/2008 2105   GFRAA  10/20/2008 2105    >60        The eGFR has been calculated using the MDRD equation. This calculation has not been validated in all clinical situations. eGFR's persistently <60 mL/min signify possible Chronic Kidney Disease.    No results found for: "SPEP", "UPEP"  Lab Results  Component Value Date   WBC 9.1 01/23/2022   NEUTROABS 5.82 01/23/2022   HGB 14.3 01/23/2022   HCT 43 01/23/2022   MCV 87 11/07/2020   PLT 331 01/23/2022      Chemistry      Component Value Date/Time   NA 138 01/23/2022 0000   K 3.7 01/23/2022 0000   CL 102 01/23/2022 0000   CO2 29 (A) 01/23/2022 0000   BUN 19 01/23/2022 0000   CREATININE 1.6 (A) 01/23/2022 0000   CREATININE 0.76 10/20/2008 2105   GLU 97 01/23/2022 0000      Component Value Date/Time   CALCIUM 9.7 01/23/2022 0000   ALKPHOS 95 01/23/2022 0000   AST 23 01/23/2022 0000   ALT 21 01/23/2022 0000   BILITOT 0.2 (L) 10/20/2008 2105       RADIOGRAPHIC STUDIES: I have personally reviewed the radiological images as listed and agreed with the findings in the report. No results found.

## 2022-01-23 NOTE — H&P (View-Only) (Signed)
Patient Care Team: Marco Collie, MD as PCP - General (Family Medicine) [name], MD as Obstetrician (Obstetrics and Gynecology) [name], MD as Referring Physician (Surgery) Derwood Kaplan, MD as Consulting Physician (Oncology) Gatha Mayer, MD as Consulting Physician (Radiation Oncology)  Clinic Day:  01/23/2022  Referring physician: Marco Collie, MD  ASSESSMENT & PLAN:   Assessment & Plan: Secondary malignant neoplasm of axillary lymph nodes [facility])  Clinical stage IIB (TXN2M0), grade 3, ductal carcinoma with no breast primary, diagnosed in February 2021.  She received neoadjuvant chemotherapy with docetaxel/doxorubicin/cyclophosphamide completed in July.  She underwent left mastectomy and axillary dissection, with no breast primary found and 9/26 lymph nodes positive for metastasis. She completed adjuvant radiation at the end of November, and was placed on hormonal therapy with tamoxifen at the end of December. Baseline bone density scan was completely normal. She was unable to tolerate tamoxifen and was switched to anastrazole. She is tolerating this well with minimal side effects, mainly muscle and joint pain and fatigue. She is also still recovering from a bout with COVID back in May and has noted an increase in her heart rate. She has an appointment with her PCP tomorrow. She will return to clinic in 4 months with repeat evaluation.   The patient understands the plans discussed today and is in agreement with them.  She knows to contact our office if she develops concerns prior to her next appointment.    Melodye Ped, NP  [facility] [address] [place] [address] Dept: [phone] Dept Fax: [phone]   Orders Placed This Encounter  Procedures   CBC and differential    This external order was created through the Results Console.   CBC    This external order was created  through the Results Console.   Basic metabolic panel    This external order was created through the Results Console.   Comprehensive metabolic panel    This external order was created through the Results Console.   Hepatic function panel    This external order was created through the Results Console.      CHIEF COMPLAINT:  CC: A 56 year old female with history of breast cancer here for 4 month evaluation.   Current Treatment:  Anastrazole  INTERVAL HISTORY:  Juliyah is here today for repeat clinical assessment. She denies fevers or chills. She denies pain. Her appetite is good. Her weight has been stable.  I have reviewed the past medical history, past surgical history, social history and family history with the patient and they are unchanged from previous note.  ALLERGIES:  is allergic to effexor [venlafaxine] and nsaids.  MEDICATIONS:  Current Outpatient Medications  Medication Sig Dispense Refill   gabapentin (NEURONTIN) 300 MG capsule Take 1 capsule (300 mg total) by mouth 2 (two) times daily. 60 capsule 3   albuterol (VENTOLIN HFA) 108 (90 Base) MCG/ACT inhaler INHALE 2 PUFFS INTO THE LUNGS EVERY 4 TO 6 HOURS AS NEEDED FOR COUGH OR WHEEZING 18 g 1   anastrozole (ARIMIDEX) 1 MG tablet TAKE 1 TABLET(1 MG) BY MOUTH DAILY 90 tablet 3   ARIPiprazole (ABILIFY) 2 MG tablet Take 1 tablet (2 mg total) by mouth daily. 90 tablet 3   Azelastine HCl 0.15 % SOLN Can use one spray in each nostril two times daily if needed. 90 mL 1   clorazepate (TRANXENE) 3.75 MG tablet Take one tablet up to four times daily  as needed for anxiety/panic attacks. 120 tablet 2   docusate sodium (COLACE) 50 MG capsule Take 50 mg by mouth 2 (two) times daily.     EPINEPHrine (EPIPEN 2-PAK) 0.3 mg/0.3 mL IJ SOAJ injection Use as directed for life-threatening allergic reaction. 1 each 3   famotidine (PEPCID) 40 MG tablet TAKE 1 TABLET BY MOUTH TWICE DAILY AS DIRECTED 60 tablet 5   fluticasone-salmeterol (ADVAIR DISKUS)  250-50 MCG/ACT AEPB INHALE 1 PUFF BY MOUTH TWICE DAILY TO PREVENT COUGH OR WHEEZE. RINSE MOUTH AFTER USE 60 each 4   folic acid (FOLVITE) 1 MG tablet TAKE 1 TABLET BY MOUTH DAILY 30 tablet 5   loratadine (CLARITIN) 10 MG tablet Take 1 tablet (10 mg total) by mouth daily. 90 tablet 3   ondansetron (ZOFRAN) 4 MG tablet TAKE 1 TABLET BY MOUTH EVERY 4 HOURS AS NEEDED FOR NAUSEA 30 tablet 0   OZEMPIC, 2 MG/DOSE, 8 MG/3ML SOPN Inject 2 mg into the skin once a week.     potassium chloride (KLOR-CON) 10 MEQ tablet TAKE 1 TABLET BY MOUTH TWICE DAILY 60 tablet 1   prochlorperazine (COMPAZINE) 10 MG tablet TAKE 1 TABLET BY MOUTH EVERY 6 HOURS AS NEEDED FOR NAUSEA 90 tablet 1   rosuvastatin (CRESTOR) 10 MG tablet Take 10 mg by mouth. Takes twice a week  1   topiramate (TOPAMAX) 50 MG tablet Take 1 tablet (50 mg total) by mouth daily. 90 tablet 3   UNABLE TO FIND Allergy injections for cats, grass, and dust.     valsartan-hydrochlorothiazide (DIOVAN-HCT) 320-25 MG tablet Take 1 tablet by mouth daily.  0   Vilazodone HCl (VIIBRYD) 40 MG TABS TAKE 1 TABLET(40 MG) BY MOUTH DAILY 90 tablet 1   No current facility-administered medications for this visit.    HISTORY OF PRESENT ILLNESS:   Oncology History  Secondary malignant neoplasm of axillary lymph nodes ([place])  08/16/2019 Cancer Staging   Staging form: Breast, AJCC 8th Edition - Clinical stage from 08/16/2019: Stage IIIA (cT0, cN2(f), cM0, G3, ER+, PR+, HER2-) - Signed by Derwood Kaplan, MD on 06/28/2020 Staging comments: TAC x 6 as neoadjuvant chemo   08/23/2019 Initial Diagnosis   Secondary malignant neoplasm of axillary lymph nodes ([place])   03/15/2020 Cancer Staging   Staging form: Breast, AJCC 8th Edition - Pathologic stage from 03/15/2020: No Stage Recommended (ypT0, pN2a, cM0, G3, ER+, PR+, HER2-) - Signed by Derwood Kaplan, MD on 06/28/2020 Staging comments: Mastectomy with no primary found, postop radiation, then hormonal Rx       REVIEW  OF SYSTEMS:   Constitutional: Denies fevers, chills or abnormal weight loss Eyes: Denies blurriness of vision Ears, nose, mouth, throat, and face: Denies mucositis or sore throat Respiratory: Denies cough, dyspnea or wheezes Cardiovascular: Denies palpitation, chest discomfort or lower extremity swelling Gastrointestinal:  Denies nausea, heartburn or change in bowel habits Skin: Denies abnormal skin rashes Lymphatics: Denies new lymphadenopathy or easy bruising Neurological:Denies numbness, tingling or new weaknesses Behavioral/Psych: Mood is stable, no new changes  All other systems were reviewed with the patient and are negative.   VITALS:  Blood pressure (!) 137/98, pulse (!) 106, temperature 99.5 F (37.5 C), temperature source Oral, resp. rate 18, height 5' 4.5" (1.638 m), weight 246 lb 4.8 oz (111.7 kg), SpO2 98 %.  Wt Readings from Last 3 Encounters:  01/23/22 246 lb 4.8 oz (111.7 kg)  12/26/21 246 lb (111.6 kg)  09/14/21 248 lb 1.6 oz (112.5 kg)    Body  mass index is 41.62 kg/m.  Performance status (ECOG): 1 - Symptomatic but completely ambulatory  PHYSICAL EXAM:   GENERAL:alert, no distress and comfortable SKIN: skin color, texture, turgor are normal, no rashes or significant lesions EYES: normal, Conjunctiva are pink and non-injected, sclera clear OROPHARYNX:no exudate, no erythema and lips, buccal mucosa, and tongue normal  NECK: supple, thyroid normal size, non-tender, without nodularity LYMPH:  no palpable lymphadenopathy in the cervical, axillary or inguinal LUNGS: clear to auscultation and percussion with normal breathing effort HEART: regular rate & rhythm and no murmurs and no lower extremity edema ABDOMEN:abdomen soft, non-tender and normal bowel sounds Musculoskeletal:no cyanosis of digits and no clubbing  NEURO: alert & oriented x 3 with fluent speech, no focal motor/sensory deficits  LABORATORY DATA:  I have reviewed the data as listed    Component  Value Date/Time   NA 138 01/23/2022 0000   K 3.7 01/23/2022 0000   CL 102 01/23/2022 0000   CO2 29 (A) 01/23/2022 0000   GLUCOSE 97 10/21/2008 0545   BUN 19 01/23/2022 0000   CREATININE 1.6 (A) 01/23/2022 0000   CREATININE 0.76 10/20/2008 2105   CALCIUM 9.7 01/23/2022 0000   PROT 6.0 10/20/2008 2105   ALBUMIN 4.3 01/23/2022 0000   AST 23 01/23/2022 0000   ALT 21 01/23/2022 0000   ALKPHOS 95 01/23/2022 0000   BILITOT 0.2 (L) 10/20/2008 2105   GFRNONAA >60 10/20/2008 2105   GFRAA  10/20/2008 2105    >60        The eGFR has been calculated using the MDRD equation. This calculation has not been validated in all clinical situations. eGFR's persistently <60 mL/min signify possible Chronic Kidney Disease.    No results found for: "SPEP", "UPEP"  Lab Results  Component Value Date   WBC 9.1 01/23/2022   NEUTROABS 5.82 01/23/2022   HGB 14.3 01/23/2022   HCT 43 01/23/2022   MCV 87 11/07/2020   PLT 331 01/23/2022      Chemistry      Component Value Date/Time   NA 138 01/23/2022 0000   K 3.7 01/23/2022 0000   CL 102 01/23/2022 0000   CO2 29 (A) 01/23/2022 0000   BUN 19 01/23/2022 0000   CREATININE 1.6 (A) 01/23/2022 0000   CREATININE 0.76 10/20/2008 2105   GLU 97 01/23/2022 0000      Component Value Date/Time   CALCIUM 9.7 01/23/2022 0000   ALKPHOS 95 01/23/2022 0000   AST 23 01/23/2022 0000   ALT 21 01/23/2022 0000   BILITOT 0.2 (L) 10/20/2008 2105       RADIOGRAPHIC STUDIES: I have personally reviewed the radiological images as listed and agreed with the findings in the report. No results found.

## 2022-01-24 DIAGNOSIS — R000 Tachycardia, unspecified: Secondary | ICD-10-CM | POA: Diagnosis not present

## 2022-01-24 DIAGNOSIS — R079 Chest pain, unspecified: Secondary | ICD-10-CM | POA: Diagnosis not present

## 2022-01-24 DIAGNOSIS — Z6841 Body Mass Index (BMI) 40.0 and over, adult: Secondary | ICD-10-CM | POA: Diagnosis not present

## 2022-01-24 DIAGNOSIS — R9431 Abnormal electrocardiogram [ECG] [EKG]: Secondary | ICD-10-CM | POA: Diagnosis not present

## 2022-01-29 ENCOUNTER — Ambulatory Visit (INDEPENDENT_AMBULATORY_CARE_PROVIDER_SITE_OTHER): Payer: 59 | Admitting: Psychiatry

## 2022-01-29 DIAGNOSIS — F411 Generalized anxiety disorder: Secondary | ICD-10-CM

## 2022-01-29 NOTE — Progress Notes (Signed)
[facility] Counselor/Therapist Progress Note  Patient ID: Laurie Simmons, MRN: [id number],    Date: 01/29/2022  Time Spent: 50 minutes   Treatment Type: Individual Therapy  Reported Symptoms: anxiety (stronger symptom), depression  Mental Status Exam:  Appearance:   Casual     Behavior:  Appropriate, Sharing, and Motivated  Motor:  Normal  Speech/Language:   Clear and Coherent  Affect:  Anxiety, depression  Mood:  anxious and depressed  Thought process:  goal directed  Thought content:    Overthinking, some ruminating  Sensory/Perceptual disturbances:    WNL  Orientation:  oriented to person, place, time/date, situation, day of week, month of year, year, and stated date of January 29, 2022  Attention:  Good  Concentration:  Good and Fair  Memory:  WNL  Fund of knowledge:   Good  Insight:    Good  Judgment:   Good  Impulse Control:  Good   Risk Assessment: Danger to Self:  No Self-injurious Behavior: No Danger to Others: No Duty to Warn:no Physical Aggression / Violence:No  Access to Firearms a concern: No  Gang Involvement:No   Subjective: Patient in today reporting anxiety as main symptom, along with depression.  States "I saw PCP last week because of some after Covid symptoms" and she found through EKG "which was abnormal" and next step is echocardiogram and a stress test. Symptoms not happening every day, but does experience excessive worrying daily and assuming "worst case scenarios" which she worked on in session today. Went with family recently to [place] and had good time. Understandably preoccupied with her waiting for echocardiogram and stress test. Focused on this today re: coping skills, helpful self-talk, managing anxiety to help patient try to let go of anxious/fearful thoughts and be able to manage her anxiety in ways that do not support her fears and anxiety but rather support her as she waits for the test trying not to assume "the worst outcome."    Interventions: Cognitive Behavioral Therapy  Long-term goal: Develop healthy cognitive patterns and beliefs about self and the world that lead to alleviation and help prevent the relapse of depressive symptoms Short-term goal: Replace negative self-defeating self-talk with verbalization of realistic and positive cognitive messages. Strategies: Reinforce positive, reality-based cognitive messages that enhance self-confidence and increase adaptive action.  Diagnosis:   ICD-10-CM   1. Generalized anxiety disorder  F41.1      Plan: Patient today showing good motivation and participation in session working on her more prominent symptom which currently is her anxiety, and also her depression.  Talked through her recent health concerns including having an EKG that was "abnormal but her physician felt things were probably okay but to be on the safe side she is going to have her do an echocardiogram and a stress test soon and then have a follow-up appointment on August 1 with Dr. To get results".  Patient states she has been very anxious and worrying excessively, lots of fearful thoughts.  She worked well in session today processing all of this and seemed to arrive at a place of being some calmer and able to coach herself through some of her anxiety.  Discussed her excessive worrying and holding onto fearful thoughts and how this is working against her.  Worked with patient on trying to focus where she really is right now which is in a waiting mode for her next testing and then getting results and trying to be more encouraging of herself knowing that right  now she does not have the answers that she wants, but trying to have more helpful behaviors and coaching herself through this time including focusing on things that mean a lot to her, focusing on her faith, using prayers, listening to some of her favorite music, and having contact with others that she knows cares about her.  Patient agreed that each of  these things would be helpful if she could do them and plans to follow through in making that effort.  Gave her examples of what this could look like and also of being able to interrupt fearful/anxious thoughts and work to replace them with more reality-based and empowering thoughts. Encouraged patient in the practice of more positive behaviors including: Remaining in the present and focusing more on what she can change or control versus cannot, trying not to assume that things are always going in a negative and fear inducing direction, getting outside some daily, staying in touch with people who are supportive, remaining on her prescribed medication, healthy nutrition and exercise, trying to not assume worst case scenarios, practice more consistent positive self talk, use inspirational books or helpful podcast that feel supportive to her, reduce overthinking over analyzing, challenge and counteract her self doubt, allow her faith to be an emotional support as well as spiritual, and realize the strength she shows when working with goal directed behaviors to move in a direction that supports her improved emotional health and overall wellbeing.  Goal review and progress/challenges noted with patient.  Next appointment within 2 to 3 weeks.  This record has been created using [employer].  Chart creation errors have been sought, but may not always have been located and corrected.  Such creation errors do not reflect on the standard of medical care provided.   Shanon Ace, LCSW

## 2022-02-01 ENCOUNTER — Other Ambulatory Visit: Payer: Self-pay | Admitting: Allergy and Immunology

## 2022-02-03 ENCOUNTER — Other Ambulatory Visit: Payer: Self-pay | Admitting: Allergy and Immunology

## 2022-02-04 DIAGNOSIS — E1169 Type 2 diabetes mellitus with other specified complication: Secondary | ICD-10-CM | POA: Diagnosis not present

## 2022-02-04 DIAGNOSIS — E785 Hyperlipidemia, unspecified: Secondary | ICD-10-CM | POA: Diagnosis not present

## 2022-02-04 DIAGNOSIS — I10 Essential (primary) hypertension: Secondary | ICD-10-CM | POA: Diagnosis not present

## 2022-02-06 DIAGNOSIS — R0609 Other forms of dyspnea: Secondary | ICD-10-CM | POA: Diagnosis not present

## 2022-02-06 DIAGNOSIS — R9431 Abnormal electrocardiogram [ECG] [EKG]: Secondary | ICD-10-CM | POA: Diagnosis not present

## 2022-02-07 ENCOUNTER — Encounter: Payer: Self-pay | Admitting: Cardiology

## 2022-02-07 DIAGNOSIS — R079 Chest pain, unspecified: Secondary | ICD-10-CM | POA: Diagnosis not present

## 2022-02-07 DIAGNOSIS — R0609 Other forms of dyspnea: Secondary | ICD-10-CM | POA: Diagnosis not present

## 2022-02-07 DIAGNOSIS — R9431 Abnormal electrocardiogram [ECG] [EKG]: Secondary | ICD-10-CM | POA: Diagnosis not present

## 2022-02-09 ENCOUNTER — Other Ambulatory Visit: Payer: Self-pay | Admitting: Allergy and Immunology

## 2022-02-11 ENCOUNTER — Ambulatory Visit (INDEPENDENT_AMBULATORY_CARE_PROVIDER_SITE_OTHER): Payer: BC Managed Care – PPO | Admitting: *Deleted

## 2022-02-11 DIAGNOSIS — E1169 Type 2 diabetes mellitus with other specified complication: Secondary | ICD-10-CM | POA: Diagnosis not present

## 2022-02-11 DIAGNOSIS — I10 Essential (primary) hypertension: Secondary | ICD-10-CM | POA: Diagnosis not present

## 2022-02-11 DIAGNOSIS — J309 Allergic rhinitis, unspecified: Secondary | ICD-10-CM

## 2022-02-11 DIAGNOSIS — R0789 Other chest pain: Secondary | ICD-10-CM | POA: Diagnosis not present

## 2022-02-11 DIAGNOSIS — R9439 Abnormal result of other cardiovascular function study: Secondary | ICD-10-CM | POA: Diagnosis not present

## 2022-02-11 DIAGNOSIS — Z6841 Body Mass Index (BMI) 40.0 and over, adult: Secondary | ICD-10-CM | POA: Diagnosis not present

## 2022-02-12 ENCOUNTER — Encounter: Payer: Self-pay | Admitting: Cardiology

## 2022-02-12 ENCOUNTER — Ambulatory Visit (INDEPENDENT_AMBULATORY_CARE_PROVIDER_SITE_OTHER): Payer: BC Managed Care – PPO | Admitting: Cardiology

## 2022-02-12 VITALS — BP 146/100 | HR 60 | Ht 65.0 in | Wt 247.0 lb

## 2022-02-12 DIAGNOSIS — I10 Essential (primary) hypertension: Secondary | ICD-10-CM

## 2022-02-12 DIAGNOSIS — R9439 Abnormal result of other cardiovascular function study: Secondary | ICD-10-CM

## 2022-02-12 DIAGNOSIS — K219 Gastro-esophageal reflux disease without esophagitis: Secondary | ICD-10-CM | POA: Diagnosis not present

## 2022-02-12 DIAGNOSIS — E119 Type 2 diabetes mellitus without complications: Secondary | ICD-10-CM | POA: Diagnosis not present

## 2022-02-12 HISTORY — DX: Abnormal result of other cardiovascular function study: R94.39

## 2022-02-12 HISTORY — DX: Essential (primary) hypertension: I10

## 2022-02-12 HISTORY — DX: Type 2 diabetes mellitus without complications: E11.9

## 2022-02-12 LAB — CBC WITH DIFFERENTIAL/PLATELET
Basophils Absolute: 0.1 10*3/uL (ref 0.0–0.2)
Basos: 1 %
EOS (ABSOLUTE): 0.2 10*3/uL (ref 0.0–0.4)
Eos: 2 %
Hematocrit: 43.9 % (ref 34.0–46.6)
Hemoglobin: 15 g/dL (ref 11.1–15.9)
Immature Grans (Abs): 0 10*3/uL (ref 0.0–0.1)
Immature Granulocytes: 0 %
Lymphocytes Absolute: 1.6 10*3/uL (ref 0.7–3.1)
Lymphs: 19 %
MCH: 29.1 pg (ref 26.6–33.0)
MCHC: 34.2 g/dL (ref 31.5–35.7)
MCV: 85 fL (ref 79–97)
Monocytes Absolute: 0.5 10*3/uL (ref 0.1–0.9)
Monocytes: 6 %
Neutrophils Absolute: 6.1 10*3/uL (ref 1.4–7.0)
Neutrophils: 72 %
Platelets: 329 10*3/uL (ref 150–450)
RBC: 5.15 x10E6/uL (ref 3.77–5.28)
RDW: 13.1 % (ref 11.7–15.4)
WBC: 8.5 10*3/uL (ref 3.4–10.8)

## 2022-02-12 LAB — BASIC METABOLIC PANEL
BUN/Creatinine Ratio: 12 (ref 9–23)
BUN: 16 mg/dL (ref 6–24)
CO2: 26 mmol/L (ref 20–29)
Calcium: 10 mg/dL (ref 8.7–10.2)
Chloride: 97 mmol/L (ref 96–106)
Creatinine, Ser: 1.36 mg/dL — ABNORMAL HIGH (ref 0.57–1.00)
Glucose: 112 mg/dL — ABNORMAL HIGH (ref 70–99)
Potassium: 3.9 mmol/L (ref 3.5–5.2)
Sodium: 137 mmol/L (ref 134–144)
eGFR: 46 mL/min/{1.73_m2} — ABNORMAL LOW (ref >59)

## 2022-02-12 NOTE — Patient Instructions (Signed)
Medication Instructions:  Your physician recommends that you continue on your current medications as directed. Please refer to the Current Medication list given to you today.  *If you need a refill on your cardiac medications before your next appointment, please call your pharmacy*   Lab Work: Your physician recommends that you return for lab work in: Today for a CBC and a BMP  If you have labs (blood work) drawn today and your tests are completely normal, you will receive your results only by: MyChart Message (if you have [place]) OR A paper copy in the mail If you have any lab test that is abnormal or we need to change your treatment, we will call you to review the results.   Testing/Procedures:  [place] [address] Dept: [phone] Loc: [facility]  02/12/2022  You are scheduled for a Cardiac Catheterization on Wednesday, August 2 with Dr. [name].  1. Please arrive at the Main Entrance A at [facility]: [place], [place] [address] at 6:30 AM (This time is two hours before your procedure to ensure your preparation). Free valet parking service is available.   Special note: Every effort is made to have your procedure done on time. Please understand that emergencies sometimes delay scheduled procedures.  2. Diet: Do not eat solid foods after midnight.  You may have clear liquids until 5 AM upon the day of the procedure.  3. Labs: You will need to have blood drawn on , September 1 at [facility] [facility]: [address], [place] . You do not need to be fasting.  4. Medication instructions in preparation for your procedure:  Contrast Allergy: No    Stop taking, Diovan (Valsartan) Tuesday, August 1,      On the morning of your procedure, take Aspirin and any morning medicines NOT listed above.  You may use sips of water.  5.  Plan to go home the same day, you will only stay overnight if medically necessary. 6. You MUST have a responsible adult to drive you home. 7. An adult MUST be with you the first 24 hours after you arrive home. 8. Bring a current list of your medications, and the last time and date medication taken. 9. Bring ID and current insurance cards. 10.Please wear clothes that are easy to get on and off and wear slip-on shoes.  Thank you for allowing [place] to care for you!   -- [facility]    Follow-Up: At [facility], you and your health needs are our priority.  As part of our continuing mission to provide you with exceptional heart care, we have created designated Provider Care Teams.  These Care Teams include your primary Cardiologist (physician) and Advanced Practice Providers (APPs -  Physician Assistants and Nurse Practitioners) who all work together to provide you with the care you need, when you need it.  We recommend signing up for the patient portal called "MyChart".  Sign up information is provided on this After Visit Summary.  MyChart is used to connect with patients for Virtual Visits (Telemedicine).  Patients are able to view lab/test results, encounter notes, upcoming appointments, etc.  Non-urgent messages can be sent to your provider as well.   To learn more about what you can do with MyChart, go to [email or web address].    Your next appointment:   6 week(s)  The format  for your next appointment:   In Person  Provider:   Jenne Campus, MD    Other Instructions   Important Information About [place]

## 2022-02-12 NOTE — Progress Notes (Unsigned)
Cardiology Consultation:    Date:  02/12/2022   ID:  AMYJO MIZRACHI, DOB May 02, 1966, MRN [id number]  PCP:  Marco Collie, MD  Cardiologist:  Jenne Campus, MD   Referring MD: Marco Collie, MD   Chief Complaint  Patient presents with   rapisd hr   Abnormal results    History of Present Illness:    Laurie Simmons is a 56 y.o. female who is being seen today for the evaluation of abnormal stress test at the request of Marco Collie, MD. past medical history significant for diabetes which is well controlled essential hypertension, dyslipidemia.  She was complaining of having some shortness of breath as well as chest tightness and eventually echocardiogram has been performed which showed normal left ventricle ejection fraction without significant pathology, she also had a stress test done which show ischemia involving LAD territory.  She is here to talk about it.  Have to admit her symptomatology is atypical.  She said she gets heartburn after eating but also described to have some chest tightness that happens with exercise for example shopping at [facility] and that point look at it is very suspicious.  She does not have family history of premature coronary artery disease, she never smoked but she is diabetic hypertensive with dyslipidemia she also had radiation towards the left breast.  So if there may be some injury to her coronaries because of radiation.  Past Medical History:  Diagnosis Date   Anxiety    Arthritis    Asthma    Breast cancer ([place]) 2021   Carpal tunnel syndrome    Carpal tunnel syndrome    bilateral   Chronic kidney disease    stage 3   Depression    Diabetes (HCC)    GERD (gastroesophageal reflux disease)    Hypertension    Irritable bowel syndrome    Severe anemia [id number]    Past Surgical History:  Procedure Laterality Date   caesarean     CHOLECYSTECTOMY     DILATION AND CURETTAGE OF UTERUS     HIP SURGERY Left    PORT A CATH INJECTION ([place] HX)  2021    Current  Medications: Current Meds  Medication Sig   albuterol (VENTOLIN HFA) 108 (90 Base) MCG/ACT inhaler INHALE 2 PUFFS INTO THE LUNGS EVERY 4 TO 6 HOURS AS NEEDED FOR COUGH OR WHEEZING (Patient taking differently: Inhale 2 puffs into the lungs every 6 (six) hours as needed for wheezing or shortness of breath. INHALE 2 PUFFS INTO THE LUNGS EVERY 4 TO 6 HOURS AS NEEDED FOR COUGH OR WHEEZING)   anastrozole (ARIMIDEX) 1 MG tablet TAKE 1 TABLET(1 MG) BY MOUTH DAILY (Patient taking differently: Take 1 mg by mouth daily.)   ARIPiprazole (ABILIFY) 2 MG tablet Take 1 tablet (2 mg total) by mouth daily.   Azelastine HCl 0.15 % SOLN Can use one spray in each nostril two times daily if needed. (Patient taking differently: Place 1 spray into both nostrils daily as needed (Allergies). Can use one spray in each nostril two times daily if needed.)   Biotin 10000 MCG TBDP Take 1 tablet by mouth daily at 6 (six) AM.   clorazepate (TRANXENE) 3.75 MG tablet Take one tablet up to four times daily as needed for anxiety/panic attacks. (Patient taking differently: Take 3.75 mg by mouth 4 (four) times daily as needed for anxiety or sleep. Take one tablet up to four times daily as needed for anxiety/panic attacks.)   docusate sodium (COLACE)  50 MG capsule Take 50 mg by mouth 2 (two) times daily.   EPINEPHrine (EPIPEN 2-PAK) 0.3 mg/0.3 mL IJ SOAJ injection Use as directed for life-threatening allergic reaction. (Patient taking differently: Inject 0.3 mg into the muscle as needed for anaphylaxis. Use as directed for life-threatening allergic reaction.)   famotidine (PEPCID) 40 MG tablet TAKE 1 TABLET BY MOUTH TWICE DAILY AS DIRECTED (Patient taking differently: Take 40 mg by mouth 2 (two) times daily as needed for heartburn or indigestion. TAKE 1 TABLET BY MOUTH TWICE DAILY AS DIRECTED)   fluticasone-salmeterol (ADVAIR DISKUS) 250-50 MCG/ACT AEPB INHALE 1 PUFF BY MOUTH TWICE DAILY TO PREVENT COUGH OR WHEEZE. RINSE MOUTH AFTER USE  (Patient taking differently: Inhale 1 puff into the lungs in the morning and at bedtime.)   folic acid (FOLVITE) 1 MG tablet TAKE 1 TABLET BY MOUTH DAILY (Patient taking differently: Take 1 mg by mouth daily.)   gabapentin (NEURONTIN) 300 MG capsule Take 1 capsule (300 mg total) by mouth 2 (two) times daily.   loratadine (CLARITIN) 10 MG tablet Can take one tablet by mouth once daily if needed. (Patient taking differently: Take 10 mg by mouth daily as needed for allergies or rhinitis. Can take one tablet by mouth once daily if needed.)   Methylcobalamin (B12) 5000 MCG SUBL Place 1 Piece of gum under the tongue daily at 6 (six) AM.   ondansetron (ZOFRAN) 4 MG tablet TAKE 1 TABLET BY MOUTH EVERY 4 HOURS AS NEEDED FOR NAUSEA (Patient taking differently: Take 4 mg by mouth every 4 (four) hours as needed for nausea or vomiting.)   OZEMPIC, 2 MG/DOSE, 8 MG/3ML SOPN Inject 2 mg into the skin once a week.   potassium chloride (KLOR-CON) 10 MEQ tablet TAKE 1 TABLET BY MOUTH TWICE DAILY (Patient taking differently: Take 10 mEq by mouth 2 (two) times daily.)   prochlorperazine (COMPAZINE) 10 MG tablet TAKE 1 TABLET BY MOUTH EVERY 6 HOURS AS NEEDED FOR NAUSEA (Patient taking differently: Take 10 mg by mouth every 6 (six) hours as needed for vomiting or nausea.)   rosuvastatin (CRESTOR) 10 MG tablet Take 10 mg by mouth daily. Takes twice a week   topiramate (TOPAMAX) 50 MG tablet Take 1 tablet (50 mg total) by mouth daily.   UNABLE TO FIND Inject 1 each as directed See admin instructions. Allergy injections for cats, grass, and dust.   valsartan-hydrochlorothiazide (DIOVAN-HCT) 320-25 MG tablet Take 1 tablet by mouth daily.   Vilazodone HCl (VIIBRYD) 40 MG TABS TAKE 1 TABLET(40 MG) BY MOUTH DAILY (Patient taking differently: Take 40 mg by mouth daily. TAKE 1 TABLET(40 MG) BY MOUTH DAILY)     Allergies:   Effexor [venlafaxine] and Nsaids   Social History   Socioeconomic History   Marital status: Married     Spouse name: Phil   Number of children: 2   Years of education: Not on file   Highest education level: Not on file  Occupational History   Not on file  Tobacco Use   Smoking status: Never   Smokeless tobacco: Never  Vaping Use   Vaping Use: Never used  Substance and Sexual Activity   Alcohol use: No   Drug use: No   Sexual activity: Yes  Other Topics Concern   Not on file  Social History Narrative   Not on file   Social Determinants of Health   Financial Resource Strain: Not on file  Food Insecurity: Not on file  Transportation Needs: Not on file  Physical Activity: Not on file  Stress: Not on file  Social Connections: Not on file     Family History: The patient's family history includes Cancer in her maternal grandmother; Lung cancer in her paternal grandfather; Prostate cancer in her maternal grandfather; Pulmonary fibrosis in her father. ROS:   Please see the history of present illness.    All 14 point review of systems negative except as described per history of present illness.  EKGs/Labs/Other Studies Reviewed:    The following studies were reviewed today: Stress to show ischemia involving LAD territory  EKG:  EKG is  ordered today.  The ekg ordered today demonstrates normal sinus rhythm normal P interval nonspecific ST segment changes  Recent Labs: 02/27/2021: TSH 3.070 01/23/2022: ALT 21; BUN 19; Creatinine 1.6; Hemoglobin 14.3; Platelets 331; Potassium 3.7; Sodium 138  Recent Lipid Panel No results found for: "CHOL", "TRIG", "HDL", "CHOLHDL", "VLDL", "LDLCALC", "LDLDIRECT"  Physical Exam:    VS:  BP (!) 146/100 (BP Location: Left Arm, Patient Position: Sitting)   Pulse 60   Ht '5\' 5"'$  (1.651 m)   Wt 247 lb (112 kg)   SpO2 96%   BMI 41.10 kg/m     Wt Readings from Last 3 Encounters:  02/12/22 247 lb (112 kg)  01/23/22 246 lb 4.8 oz (111.7 kg)  12/26/21 246 lb (111.6 kg)     GEN:  Well nourished, well developed in no acute distress HEENT:  Normal NECK: No JVD; No carotid bruits LYMPHATICS: No lymphadenopathy CARDIAC: RRR, no murmurs, no rubs, no gallops RESPIRATORY:  Clear to auscultation without rales, wheezing or rhonchi  ABDOMEN: Soft, non-tender, non-distended MUSCULOSKELETAL:  No edema; No deformity  SKIN: Warm and dry NEUROLOGIC:  Alert and oriented x 3 PSYCHIATRIC:  Normal affect   ASSESSMENT:    1. Gastroesophageal reflux disease, unspecified whether esophagitis present   2. Abnormal stress test: Anterior wall ischemia   3. Essential hypertension   4. Type 2 diabetes mellitus without complication, without long-term current use of insulin (HCC)    PLAN:    In order of problems listed above:  Abnormal stress test with anterior wall ischemia we had a long discussion about what to do with the situation we decided all pros and cons of different approaches.  She decided to proceed with cardiac catheterization.  Procedure has been explained to her including all risk benefits as well as alternative.  She is already on aspirin which I will continue she does have nitroglycerin as needed.  She is also on statin which I will continue. Essential hypertension blood pressure elevated but this is first visit my office.  We will continue monitoring and she seems to be very nervous. Dyslipidemia she recently started to Crestor which I will continue I did review K PN which show LDL 95 HDL 41 most likely we will have to intensify lipid therapy. Diabetes that being followed by internal medicine team.  Her hemoglobin A1c is 6.0 good control continue present management.   Medication Adjustments/Labs and Tests Ordered: Current medicines are reviewed at length with the patient today.  Concerns regarding medicines are outlined above.  No orders of the defined types were placed in this encounter.  No orders of the defined types were placed in this encounter.   Signed, Park Liter, MD, [facility]. 02/12/2022 8:37 AM    [place]

## 2022-02-13 ENCOUNTER — Other Ambulatory Visit: Payer: Self-pay

## 2022-02-13 ENCOUNTER — Encounter (HOSPITAL_COMMUNITY): Payer: Self-pay | Admitting: Interventional Cardiology

## 2022-02-13 ENCOUNTER — Encounter (HOSPITAL_COMMUNITY)
Admission: RE | Disposition: A | Discharge status: Home / Self Care | Payer: Self-pay | Source: Ambulatory Visit | Attending: Interventional Cardiology

## 2022-02-13 ENCOUNTER — Ambulatory Visit (HOSPITAL_COMMUNITY)
Admission: RE | Admit: 2022-02-13 | Discharge: 2022-02-13 | Disposition: A | Discharge status: Home / Self Care | Payer: BC Managed Care – PPO | Source: Ambulatory Visit | Attending: Interventional Cardiology | Admitting: Interventional Cardiology

## 2022-02-13 DIAGNOSIS — C773 Secondary and unspecified malignant neoplasm of axilla and upper limb lymph nodes: Secondary | ICD-10-CM | POA: Diagnosis not present

## 2022-02-13 DIAGNOSIS — I2510 Atherosclerotic heart disease of native coronary artery without angina pectoris: Secondary | ICD-10-CM | POA: Diagnosis not present

## 2022-02-13 DIAGNOSIS — R9439 Abnormal result of other cardiovascular function study: Secondary | ICD-10-CM

## 2022-02-13 DIAGNOSIS — Z79811 Long term (current) use of aromatase inhibitors: Secondary | ICD-10-CM | POA: Diagnosis not present

## 2022-02-13 HISTORY — PX: LEFT HEART CATH AND CORONARY ANGIOGRAPHY: CATH118249

## 2022-02-13 LAB — GLUCOSE, CAPILLARY: Glucose-Capillary: 113 mg/dL — ABNORMAL HIGH (ref 70–99)

## 2022-02-13 SURGERY — LEFT HEART CATH AND CORONARY ANGIOGRAPHY
Anesthesia: LOCAL

## 2022-02-13 MED ORDER — SODIUM CHLORIDE 0.9% FLUSH: 3.0000 mL | INTRAVENOUS | Status: DC | PRN

## 2022-02-13 MED ORDER — SODIUM CHLORIDE 0.9 % WEIGHT BASED INFUSION: 1.0000 mL/kg/h | INTRAVENOUS | Status: DC

## 2022-02-13 MED ORDER — SODIUM CHLORIDE 0.9 % IV SOLN
INTRAVENOUS | Status: AC
Start: 2022-02-13 — End: 2022-02-13

## 2022-02-13 MED ORDER — VERAPAMIL HCL 2.5 MG/ML IV SOLN
INTRAVENOUS | Status: DC | PRN
  Administered 2022-02-13 (×2): 10 mL via INTRA_ARTERIAL

## 2022-02-13 MED ORDER — MIDAZOLAM HCL 2 MG/2ML IJ SOLN
INTRAMUSCULAR | Status: AC
  Filled 2022-02-13: qty 2

## 2022-02-13 MED ORDER — ONDANSETRON HCL 4 MG/2ML IJ SOLN: 4.0000 mg | Freq: Four times a day (QID) | INTRAMUSCULAR | Status: DC | PRN

## 2022-02-13 MED ORDER — FENTANYL CITRATE (PF) 100 MCG/2ML IJ SOLN
INTRAMUSCULAR | Status: DC | PRN
  Administered 2022-02-13 (×2): 25 ug via INTRAVENOUS

## 2022-02-13 MED ORDER — HEPARIN SODIUM (PORCINE) 1000 UNIT/ML IJ SOLN
INTRAMUSCULAR | Status: AC
  Filled 2022-02-13: qty 10

## 2022-02-13 MED ORDER — VERAPAMIL HCL 2.5 MG/ML IV SOLN
INTRAVENOUS | Status: AC
Start: 2022-02-13 — End: ?
  Filled 2022-02-13: qty 2

## 2022-02-13 MED ORDER — ACETAMINOPHEN 325 MG PO TABS: 650.0000 mg | ORAL_TABLET | ORAL | Status: DC | PRN

## 2022-02-13 MED ORDER — LABETALOL HCL 5 MG/ML IV SOLN: 10.0000 mg | INTRAVENOUS | Status: DC | PRN

## 2022-02-13 MED ORDER — SODIUM CHLORIDE 0.9% FLUSH: 3.0000 mL | Freq: Two times a day (BID) | INTRAVENOUS | Status: DC

## 2022-02-13 MED ORDER — HYDRALAZINE HCL 20 MG/ML IJ SOLN: 10.0000 mg | INTRAMUSCULAR | Status: DC | PRN

## 2022-02-13 MED ORDER — HEPARIN SODIUM (PORCINE) 1000 UNIT/ML IJ SOLN
INTRAMUSCULAR | Status: DC | PRN
  Administered 2022-02-13: 5000 [IU] via INTRAVENOUS

## 2022-02-13 MED ORDER — SODIUM CHLORIDE 0.9 % IV SOLN: 250.0000 mL | INTRAVENOUS | Status: DC | PRN

## 2022-02-13 MED ORDER — MIDAZOLAM HCL 2 MG/2ML IJ SOLN
INTRAMUSCULAR | Status: DC | PRN
  Administered 2022-02-13: 1 mg via INTRAVENOUS
  Administered 2022-02-13: 2 mg via INTRAVENOUS

## 2022-02-13 MED ORDER — LIDOCAINE HCL (PF) 1 % IJ SOLN
INTRAMUSCULAR | Status: DC | PRN
  Administered 2022-02-13: 2 mL

## 2022-02-13 MED ORDER — HEPARIN (PORCINE) IN NACL 1000-0.9 UT/500ML-% IV SOLN
INTRAVENOUS | Status: AC
  Filled 2022-02-13: qty 1000

## 2022-02-13 MED ORDER — SODIUM CHLORIDE 0.9 % WEIGHT BASED INFUSION
3.0000 mL/kg/h | INTRAVENOUS | Status: AC
Start: 2022-02-13 — End: 2022-02-13
  Administered 2022-02-13: 3 mL/kg/h via INTRAVENOUS

## 2022-02-13 MED ORDER — FENTANYL CITRATE (PF) 100 MCG/2ML IJ SOLN
INTRAMUSCULAR | Status: AC
  Filled 2022-02-13: qty 2

## 2022-02-13 MED ORDER — HEPARIN (PORCINE) IN NACL 1000-0.9 UT/500ML-% IV SOLN
INTRAVENOUS | Status: DC | PRN
  Administered 2022-02-13 (×2): 500 mL

## 2022-02-13 MED ORDER — IOHEXOL 350 MG/ML SOLN
INTRAVENOUS | Status: DC | PRN
  Administered 2022-02-13: 50 mL via INTRA_ARTERIAL

## 2022-02-13 MED ORDER — ASPIRIN 81 MG PO CHEW
81.0000 mg | CHEWABLE_TABLET | ORAL | Status: AC
  Administered 2022-02-13: 81 mg via ORAL

## 2022-02-13 MED ORDER — LIDOCAINE HCL (PF) 1 % IJ SOLN
INTRAMUSCULAR | Status: AC
  Filled 2022-02-13: qty 30

## 2022-02-13 SURGICAL SUPPLY — 10 items
BAND CMPR LRG ZPHR (HEMOSTASIS) ×1
BAND ZEPHYR COMPRESS 30 LONG (HEMOSTASIS) ×1 IMPLANT
CATH 5FR JL3.5 JR4 ANG PIG MP (CATHETERS) ×1 IMPLANT
GLIDESHEATH SLEND SS 6F .021 (SHEATH) ×1 IMPLANT
GUIDEWIRE INQWIRE 1.5J.035X260 (WIRE) IMPLANT
INQWIRE 1.5J .035X260CM (WIRE) ×2
KIT HEART LEFT (KITS) ×2 IMPLANT
PACK CARDIAC CATHETERIZATION (CUSTOM PROCEDURE TRAY) ×2 IMPLANT
TRANSDUCER W/STOPCOCK (MISCELLANEOUS) ×2 IMPLANT
TUBING CIL FLEX 10 FLL-RA (TUBING) ×2 IMPLANT

## 2022-02-13 NOTE — Interval H&P Note (Signed)
Cath Lab Visit (complete for each Cath Lab visit)  Clinical Evaluation Leading to the Procedure:   ACS: No.  Non-ACS:    Anginal Classification: CCS III  Anti-ischemic medical therapy: Minimal Therapy (1 class of medications)  Non-Invasive Test Results: Intermediate-risk stress test findings: cardiac mortality 1-3%/year  Prior CABG: No previous CABG   Anterior ischemia on stress test in patient with atypical chest pain.     History and Physical Interval Note:  02/13/2022 8:44 AM  Laurie Simmons  has presented today for surgery, with the diagnosis of abnormal stress test.  The various methods of treatment have been discussed with the patient and family. After consideration of risks, benefits and other options for treatment, the patient has consented to  Procedure(s): LEFT HEART CATH AND CORONARY ANGIOGRAPHY (N/A) as a surgical intervention.  The patient's history has been reviewed, patient examined, no change in status, stable for surgery.  I have reviewed the patient's chart and labs.  Questions were answered to the patient's satisfaction.     Laurie Simmons

## 2022-02-13 NOTE — Progress Notes (Signed)
Patient and husband was given discharge instructions. Both verbalized understanding. 

## 2022-02-13 NOTE — Discharge Instructions (Signed)

## 2022-02-18 ENCOUNTER — Telehealth: Payer: Self-pay

## 2022-02-18 ENCOUNTER — Ambulatory Visit (INDEPENDENT_AMBULATORY_CARE_PROVIDER_SITE_OTHER): Payer: BC Managed Care – PPO | Admitting: Allergy and Immunology

## 2022-02-18 VITALS — BP 132/86 | HR 96 | Resp 16 | Ht 64.0 in | Wt 245.4 lb

## 2022-02-18 DIAGNOSIS — J4540 Moderate persistent asthma, uncomplicated: Secondary | ICD-10-CM

## 2022-02-18 DIAGNOSIS — K219 Gastro-esophageal reflux disease without esophagitis: Secondary | ICD-10-CM | POA: Diagnosis not present

## 2022-02-18 DIAGNOSIS — J3089 Other allergic rhinitis: Secondary | ICD-10-CM

## 2022-02-18 MED ORDER — SUCRALFATE 1 G PO TABS: 1.0000 g | ORAL_TABLET | Freq: Three times a day (TID) | ORAL | 5 refills | Status: DC

## 2022-02-18 MED ORDER — FLUTICASONE-SALMETEROL 250-50 MCG/ACT IN AEPB
1.0000 | INHALATION_SPRAY | Freq: Two times a day (BID) | RESPIRATORY_TRACT | 5 refills | Status: DC
Start: 2022-02-18 — End: 2022-11-08

## 2022-02-18 MED ORDER — ALBUTEROL SULFATE HFA 108 (90 BASE) MCG/ACT IN AERS: 2.0000 | INHALATION_SPRAY | RESPIRATORY_TRACT | 1 refills | Status: DC | PRN

## 2022-02-18 NOTE — Telephone Encounter (Signed)
Referral has been sent to Jackquline Denmark, MD office for an upper endoscopy.  He no longer has an office in [place].    Office Address: [address] [place] [address], [place], [place] [address]  Fax: [phone]) Phone: [phone]

## 2022-02-18 NOTE — Progress Notes (Unsigned)
[place] [facility] [place] [facility] [place]   Follow-up Note  Referring Provider: Marco Collie, MD Primary Provider: Marco Collie, MD Date of Office Visit: 02/18/2022  Subjective:   Laurie Simmons (DOB: Nov 28, 1965) is a 56 y.o. female who returns to the [facility] on 02/18/2022 in re-evaluation of the following:  HPI: Analiz returns to this clinic in evaluation of asthma, allergic rhinoconjunctivitis, reflux.  I last saw her in this clinic on 26 December 2021.  Her issue today is that she is having very significant problems with reflux.  She is having regurgitation and she is having burning and she is getting more throat clearing and epigastric discomfort.  She continues to use famotidine twice a day, adds Tums, and has eliminated all caffeine.  She is not on any fish oils or other supplements other than B12 and biotin.  Her last upper endoscopy was 17 years ago.  Her asthma is doing great.  She rarely uses a short acting bronchodilator.  Her upper airway has been doing very good.  She continues to use her Advair mostly once a day and Flonase a few times per week and continues on immunotherapy without adverse effect.  She just completed extensive evaluation for chest pain and palpitations with a negative cardiac catheterization.  Allergies as of 02/18/2022       Reactions   Effexor [venlafaxine] Palpitations   Nsaids Other (See Comments)   Pt reports kidney disease        Medication List    acetaminophen 500 MG tablet Commonly known as: TYLENOL Take 1,000 mg by mouth every 6 (six) hours as needed for moderate pain.   Advair Diskus 250-50 MCG/ACT Aepb Generic drug: fluticasone-salmeterol INHALE 1 PUFF BY MOUTH TWICE DAILY TO PREVENT COUGH OR WHEEZE. RINSE MOUTH AFTER USE   albuterol 108 (90 Base) MCG/ACT inhaler Commonly known as: VENTOLIN HFA INHALE 2 PUFFS INTO THE LUNGS EVERY 4 TO 6 HOURS AS NEEDED FOR COUGH OR WHEEZING   anastrozole 1 MG  tablet Commonly known as: ARIMIDEX TAKE 1 TABLET(1 MG) BY MOUTH DAILY   ARIPiprazole 2 MG tablet Commonly known as: Abilify Take 1 tablet (2 mg total) by mouth daily.   aspirin EC 81 MG tablet Take 81 mg by mouth daily. Swallow whole.   Azelastine HCl 0.15 % Soln Can use one spray in each nostril two times daily if needed.   B-12 5000 MCG Caps Take 5,000 mcg by mouth daily.   Biotin 10000 MCG Tbdp Take 10,000 mcg by mouth daily.   calcium carbonate 750 MG chewable tablet Commonly known as: TUMS EX Chew 1,500 mg by mouth daily as needed for heartburn.   clorazepate 3.75 MG tablet Commonly known as: TRANXENE Take one tablet up to four times daily as needed for anxiety/panic attacks.   EPINEPHrine 0.3 mg/0.3 mL Soaj injection Commonly known as: EpiPen 2-Pak Use as directed for life-threatening allergic reaction.   famotidine 40 MG tablet Commonly known as: PEPCID TAKE 1 TABLET BY MOUTH TWICE DAILY AS DIRECTED   fluticasone 50 MCG/ACT nasal spray Commonly known as: FLONASE Place 2 sprays into both nostrils daily as needed for allergies or rhinitis.   folic acid 1 MG tablet Commonly known as: FOLVITE TAKE 1 TABLET BY MOUTH DAILY   gabapentin 300 MG capsule Commonly known as: NEURONTIN Take 1 capsule (300 mg total) by mouth 2 (two) times daily. What changed: when to take this   loratadine 10 MG tablet Commonly  known as: CLARITIN Can take one tablet by mouth once daily if needed.   nitroGLYCERIN 0.4 MG SL tablet Commonly known as: NITROSTAT Place 0.4 mg under the tongue every 5 (five) minutes as needed for chest pain.   ondansetron 4 MG tablet Commonly known as: ZOFRAN TAKE 1 TABLET BY MOUTH EVERY 4 HOURS AS NEEDED FOR NAUSEA   Ozempic (2 MG/DOSE) 8 MG/3ML Sopn Generic drug: Semaglutide (2 MG/DOSE) Inject 2 mg into the skin every Sunday.   potassium chloride 10 MEQ tablet Commonly known as: KLOR-CON TAKE 1 TABLET BY MOUTH TWICE DAILY   prochlorperazine 10  MG tablet Commonly known as: COMPAZINE TAKE 1 TABLET BY MOUTH EVERY 6 HOURS AS NEEDED FOR NAUSEA   rosuvastatin 10 MG tablet Commonly known as: CRESTOR Take 10 mg by mouth 2 (two) times a week. Tuesdays and Thursdays   senna-docusate 8.6-50 MG tablet Commonly known as: Senokot-S Take 2 tablets by mouth daily.   topiramate 50 MG tablet Commonly known as: TOPAMAX Take 1 tablet (50 mg total) by mouth daily.   UNABLE TO FIND Inject 1 each as directed See admin instructions. Allergy injections for cats, grass, and dust.   valsartan-hydrochlorothiazide 320-25 MG tablet Commonly known as: DIOVAN-HCT Take 1 tablet by mouth daily.   Vilazodone HCl 40 MG Tabs Commonly known as: VIIBRYD TAKE 1 TABLET(40 MG) BY MOUTH DAILY    Past Medical History:  Diagnosis Date   Anxiety    Arthritis    Asthma    Breast cancer ([place]) 2021   Carpal tunnel syndrome    Carpal tunnel syndrome    bilateral   Chronic kidney disease    stage 3   Depression    Diabetes (HCC)    GERD (gastroesophageal reflux disease)    Hypertension    Irritable bowel syndrome    Severe anemia [id number]    Past Surgical History:  Procedure Laterality Date   caesarean     CHOLECYSTECTOMY     DILATION AND CURETTAGE OF UTERUS     HIP SURGERY Left    LEFT HEART CATH AND CORONARY ANGIOGRAPHY N/A 02/13/2022   Procedure: LEFT HEART CATH AND CORONARY ANGIOGRAPHY;  Surgeon: Jettie Booze, MD;  Location: [place] [facility];  Service: Cardiovascular;  Laterality: N/A;   PORT A CATH INJECTION ([place] HX)  2021    Review of systems negative except as noted in HPI / PMHx or noted below:  Review of Systems  Constitutional: Negative.   HENT: Negative.    Eyes: Negative.   Respiratory: Negative.    Cardiovascular: Negative.   Gastrointestinal: Negative.   Genitourinary: Negative.   Musculoskeletal: Negative.   Skin: Negative.   Neurological: Negative.   Endo/Heme/Allergies: Negative.   Psychiatric/Behavioral:  Negative.       Objective:   Vitals:   02/18/22 1022  BP: 132/86  Pulse: 96  Resp: 16  SpO2: 98%   Height: '5\' 4"'$  (162.6 cm)  Weight: 245 lb 6.4 oz (111.3 kg)   Physical Exam Constitutional:      Appearance: She is not diaphoretic.  HENT:     Head: Normocephalic.     Right Ear: Tympanic membrane, ear canal and external ear normal.     Left Ear: Tympanic membrane, ear canal and external ear normal.     Nose: Nose normal. No mucosal edema or rhinorrhea.     Mouth/Throat:     Pharynx: Uvula midline. No oropharyngeal exudate.  Eyes:     Conjunctiva/sclera: Conjunctivae normal.  Neck:     Thyroid: No thyromegaly.     Trachea: Trachea normal. No tracheal tenderness or tracheal deviation.  Cardiovascular:     Rate and Rhythm: Normal rate and regular rhythm.     Heart sounds: Normal heart sounds, S1 normal and S2 normal. No murmur heard. Pulmonary:     Effort: No respiratory distress.     Breath sounds: Normal breath sounds. No stridor. No wheezing or rales.  Lymphadenopathy:     Head:     Right side of head: No tonsillar adenopathy.     Left side of head: No tonsillar adenopathy.     Cervical: No cervical adenopathy.  Skin:    Findings: No erythema or rash.     Nails: There is no clubbing.  Neurological:     Mental Status: She is alert.     Diagnostics: Spirometry was performed and demonstrated an FEV1 of 2.29 at 86 % of predicted.  Assessment and Plan:   1. Asthma, moderate persistent, well-controlled   2. Other allergic rhinitis   3. LPRD (laryngopharyngeal reflux disease)     1. Continue Advair 250 - 1 inhalations 1-2 times per day    2. Continue Flonase - 1 spray each nostril 3-7 times per week  3. Continue immunotherapy and EpiPen  4. Treat and evaluate reflux:   A. Continue famotidine 40 mg -1 tablet 2 times per day  B. Add carafate 1 gm 3 times a day and at bedtime  C. Obtain upper endoscopy  5. If needed:   A. Ventolin  B. OTC  antihistamime  C. Nasal saline  D. Azelastine 1 spray each nostril 2 times per day  6. Return to clinic in 6 months or earlier if problem  7. Obtain fall flu vaccine   Laurie Simmons is doing very well regarding her airway issue but her reflux is out of control.  She will not use a proton pump inhibitor because of a kidney issue.  Her famotidine twice a day is inadequate therapy.  I will give her some Carafate until that point in time in which she can have an upper endoscopy performed.  I will see her back in this clinic in 6 months or earlier if there is a problem with the plan noted above.   Allena Katz, MD Allergy / Immunology [place]

## 2022-02-18 NOTE — Patient Instructions (Addendum)
  1. Continue Advair 250 - 1 inhalations 1-2 times per day    2. Continue Flonase - 1 spray each nostril 3-7 times per week  3. Continue immunotherapy and EpiPen  4. Treat and evaluate reflux:   A. Continue famotidine 40 mg -1 tablet 2 times per day  B. Add carafate 1 gm 3 times a day and at bedtime  C. Obtain upper endoscopy  5. If needed:   A. Ventolin  B. OTC antihistamime  C. Nasal saline  D. Azelastine 1 spray each nostril 2 times per day  6. Return to clinic in 6 months or earlier if problem  7. Obtain fall flu vaccine

## 2022-02-19 ENCOUNTER — Encounter: Payer: Self-pay | Admitting: Allergy and Immunology

## 2022-02-26 ENCOUNTER — Ambulatory Visit (INDEPENDENT_AMBULATORY_CARE_PROVIDER_SITE_OTHER): Payer: 59 | Admitting: Psychiatry

## 2022-02-26 DIAGNOSIS — F411 Generalized anxiety disorder: Secondary | ICD-10-CM

## 2022-02-26 NOTE — Progress Notes (Signed)
[facility] Counselor/Therapist Progress Note  Patient ID: Laurie Simmons, MRN: [id number],    Date: 02/26/2022  Time Spent: 50 minutes   Treatment Type: Individual Therapy  Reported Symptoms: anxiety, "some depression", relieved recent cardiac testing was normal-Dr thinks palpatations may be related to her previously having Covid.  Mental Status Exam:  Appearance:   Casual     Behavior:  Appropriate, Sharing, and Motivated  Motor:  Normal  Speech/Language:   Clear and Coherent  Affect:  anxious  Mood:  anxious  Thought process:  goal directed  Thought content:    "A little bit of obsessive thoughts"  Sensory/Perceptual disturbances:    WNL  Orientation:  oriented to person, place, time/date, situation, day of week, month of year, year, and stated date of February 26, 2022  Attention:  Good  Concentration:  Good and Fair  Memory:  WNL  Fund of knowledge:   Good  Insight:    Good  Judgment:   Good  Impulse Control:  Good   Risk Assessment: Danger to Self:  No Self-injurious Behavior: No Danger to Others: No Duty to Warn:no Physical Aggression / Violence:No  Access to Firearms a concern: No  Gang Involvement:No   Subjective:  Patient in today reporting anxiety as her main symptom and related to health issues and past of working in school system, but "know I don't have to go back there, now." Reports some improvement in thinking negatively and not assuming worst case scenarios. Reports a couple more panic attacks due to having heart cath done "but all was ok in results." Processed anxious thoughts. 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 Not as bad "as it used to be". "Handling stressors/triggers some better than I used to." Trying to manage her anxiety more effectively. Not many friends. Encouraged her interaction at church with more people and has been able to do this some. Worked on her self-doubt. Discussed and reviewed her need for  positive self-talk, good coping skills, and being patient with herself as she continues to heal.  Interventions: Cognitive Behavioral Therapy, Solution-Oriented/Positive Psychology, and Ego-Supportive  Long-term goal: Develop healthy cognitive patterns and beliefs about self and the world that lead to alleviation and help prevent the relapse of depressive symptoms Short-term goal: Replace negative self-defeating self-talk with verbalization of realistic and positive cognitive messages. Strategies: Reinforce positive, reality-based cognitive messages that enhance self-confidence and increase adaptive action.   Diagnosis:   ICD-10-CM   1. 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  Discussed that at more length today and trying to help her in working on her self doubt and also in more confidence in handling the thoughts when they do arise, and realizing what her reality is at that point in terms of that job being in her past and not in her present, including how things are very different for her now.  Is showing a little bit of progress and managing stressful situations although did report she has had a few panic attacks with all being health related when she was alone or with medical staff only during procedures, which the results came out well.  Does seem to be feeling more encouraged about results.  Is to have an  endoscopic soon regarding her "acid reflux issues".  Willing to work more on being a little more interactive with people when she has the opportunity such as at her church. Encouraged patient in her practice of more positive and encouraging behaviors including: Staying in the present and focusing more on what she can change versus cannot, trying not to assume that things are always  going in a negative and fear inducing direction, staying in touch with people who are supportive of her, getting outside some each day, staying on her prescribed medication, healthy nutrition and exercise, trying to not assume worst case scenarios, positive self talk, use of inspirational books or helpful podcast that feel supportive to her, reduce overthinking and over analyzing, challenge and counteract her self doubt, allow her faith to be an emotional support as well as spiritual, and recognize the strength she shows when working with goal directed behaviors to move in a direction that supports her improved emotional health.  Review and progress/challenges noted with patient.  Next appointment within 3 weeks.  This record has been created using [employer].  Chart creation errors have been sought, but may not always have been located and corrected.  Such creation errors do not reflect on the standard of medical care provided.   Shanon Ace, LCSW

## 2022-02-27 ENCOUNTER — Ambulatory Visit (INDEPENDENT_AMBULATORY_CARE_PROVIDER_SITE_OTHER): Payer: BC Managed Care – PPO | Admitting: *Deleted

## 2022-02-27 DIAGNOSIS — J309 Allergic rhinitis, unspecified: Secondary | ICD-10-CM | POA: Diagnosis not present

## 2022-02-27 DIAGNOSIS — R928 Other abnormal and inconclusive findings on diagnostic imaging of breast: Secondary | ICD-10-CM | POA: Diagnosis not present

## 2022-03-01 ENCOUNTER — Encounter: Payer: Self-pay | Admitting: Gastroenterology

## 2022-03-01 NOTE — Telephone Encounter (Signed)
I called the office about the Referral to see Jackquline Denmark, MD for an upper endoscopy. The office has received the referral I notified the patient and she plans to call the office to make an appointment. Patient has been notified that he no longer has an Technical sales engineer office and she will be seen at their [place] location. I provided the patient with their address and phone number.

## 2022-03-11 ENCOUNTER — Encounter (INDEPENDENT_AMBULATORY_CARE_PROVIDER_SITE_OTHER): Payer: BC Managed Care – PPO | Admitting: *Deleted

## 2022-03-11 DIAGNOSIS — J309 Allergic rhinitis, unspecified: Secondary | ICD-10-CM

## 2022-03-11 DIAGNOSIS — C773 Secondary and unspecified malignant neoplasm of axilla and upper limb lymph nodes: Secondary | ICD-10-CM | POA: Diagnosis not present

## 2022-03-11 NOTE — Progress Notes (Signed)
This encounter was created in error - please disregard.

## 2022-03-24 ENCOUNTER — Other Ambulatory Visit: Payer: Self-pay | Admitting: Adult Health

## 2022-03-24 DIAGNOSIS — F411 Generalized anxiety disorder: Secondary | ICD-10-CM

## 2022-03-25 ENCOUNTER — Ambulatory Visit (INDEPENDENT_AMBULATORY_CARE_PROVIDER_SITE_OTHER): Payer: 59 | Admitting: Psychiatry

## 2022-03-25 DIAGNOSIS — F411 Generalized anxiety disorder: Secondary | ICD-10-CM | POA: Diagnosis not present

## 2022-03-25 NOTE — Progress Notes (Signed)
[facility] Counselor/Therapist Progress Note  Patient ID: CHIFFON KITTLESON, MRN: [id number],    Date: 03/25/2022  Time Spent: 55 minutes  Treatment Type: Individual Therapy  Reported Symptoms: anxiety, depression (improved)  Mental Status Exam:  Appearance:   Casual     Behavior:  Appropriate, Sharing, and Motivated  Motor:  Normal  Speech/Language:   Clear and Coherent  Affect:  anxious  Mood:  anxious  Thought process:  goal directed  Thought content:    WNL  Sensory/Perceptual disturbances:    WNL  Orientation:  oriented to person, place, time/date, situation, day of week, month of year, year, and stated date of Sept. 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 Not as anxious today and "not getting as wound up emotionally over things" . Reports being better able to calm herself down at times. Acknowledges letting 8th grade son manipulate her or wanting to set better boundaries with him which she processed in session today and considering some motivating steps for son in getting up and getting ready for school and taking his adhd medication. Last session discussed her trying to make some friends at her church but states she has not had a chance to follow through on some suggested strategies in working with that. Does feel she is managing her anxiety better. "Much better than when I was working." Reviewed need for more improved self-talk. Working to  improve coping skills and patience.Continued work on her self-doubt and some progress noted today.  Interventions: Cognitive Behavioral Therapy and Ego-Supportive  Long-term goal: Develop healthy cognitive patterns and beliefs about self and the world that lead to alleviation and help prevent the relapse of depressive symptoms Short-term goal: Replace negative self-defeating self-talk with verbalization of realistic and positive cognitive messages. Strategies: Reinforce positive, reality-based cognitive messages that enhance self-confidence and increase adaptive action.   Diagnosis:   ICD-10-CM   1. Generalized anxiety disorder  F41.1      Plan: Patient in today showing better motivation and active participation in session working further on her anxiety, and reporting that depression has decreased.  Discussed some ways of being closer to people and developing friendships which still feels a little threatening to patient.  Continued work on self doubt, not assuming worst case scenarios, trying to have more confidence, better manage stressful situations, and increasing self reliance.  Also working on some issues with her son who is ADHD. Encouraged patient in practicing more positive and encouraging behaviors including: Staying in the present and focusing more on what she can change versus cannot, trying not to assume that things are always going in a negative and fear inducing direction, staying in touch with people who are supportive of her, get outside some each day, remaining on her prescribed medication, healthy nutrition and exercise, trying not to assume worst case scenarios, positive self talk, reduce overthinking and over analyzing, challenge and counteract her self  doubt, and realize the strength she shows working with goal directed behaviors move in a direction that supports her improved emotional health.  Goal review and progress/challenges noted with patient.  Next appointment within  3 weeks.  This record has been created using [employer].  Chart creation errors have been sought, but may not always have been located and corrected.  Such creation errors do not reflect on the standard of medical care provided.   Shanon Ace, LCSW

## 2022-03-26 ENCOUNTER — Ambulatory Visit: Payer: BC Managed Care – PPO | Admitting: Psychiatry

## 2022-03-27 ENCOUNTER — Encounter: Payer: Self-pay | Admitting: Cardiology

## 2022-03-27 ENCOUNTER — Ambulatory Visit (INDEPENDENT_AMBULATORY_CARE_PROVIDER_SITE_OTHER): Payer: BC Managed Care – PPO | Admitting: *Deleted

## 2022-03-27 ENCOUNTER — Ambulatory Visit: Payer: BC Managed Care – PPO | Attending: Cardiology | Admitting: Cardiology

## 2022-03-27 VITALS — BP 128/84 | HR 106 | Ht 65.0 in | Wt 246.6 lb

## 2022-03-27 DIAGNOSIS — I10 Essential (primary) hypertension: Secondary | ICD-10-CM | POA: Diagnosis not present

## 2022-03-27 DIAGNOSIS — J309 Allergic rhinitis, unspecified: Secondary | ICD-10-CM | POA: Diagnosis not present

## 2022-03-27 DIAGNOSIS — E119 Type 2 diabetes mellitus without complications: Secondary | ICD-10-CM

## 2022-03-27 DIAGNOSIS — E785 Hyperlipidemia, unspecified: Secondary | ICD-10-CM

## 2022-03-27 DIAGNOSIS — R9439 Abnormal result of other cardiovascular function study: Secondary | ICD-10-CM | POA: Diagnosis not present

## 2022-03-27 HISTORY — DX: Hyperlipidemia, unspecified: E78.5

## 2022-03-27 MED ORDER — ASPIRIN 81 MG PO TBEC: 81.0000 mg | DELAYED_RELEASE_TABLET | Freq: Every day | ORAL | 3 refills | Status: AC

## 2022-03-27 MED ORDER — ROSUVASTATIN CALCIUM 10 MG PO TABS: 10.0000 mg | ORAL_TABLET | Freq: Every day | ORAL | 3 refills | Status: DC

## 2022-03-27 NOTE — Patient Instructions (Addendum)
Medication Instructions:  Your physician has recommended you make the following change in your medication:   INCREASE: Crestor to '10mg'$  daily by mouth  START: Enteric Coated ASA 1 tablet daily by mouth    Lab Work: Your physician recommends that you return for lab work in: 6 weeks You need to have labs done when you are fasting.  You can come Monday through Friday 8:30 am to 12:00 pm and 1:15 to 4:30. You do not need to make an appointment as the order has already been placed. The labs you are going to have done are AST, ALT Lipids.    Testing/Procedures: None Ordered   Follow-Up: At [facility], you and your health needs are our priority.  As part of our continuing mission to provide you with exceptional heart care, we have created designated Provider Care Teams.  These Care Teams include your primary Cardiologist (physician) and Advanced Practice Providers (APPs -  Physician Assistants and Nurse Practitioners) who all work together to provide you with the care you need, when you need it.  We recommend signing up for the patient portal called "MyChart".  Sign up information is provided on this After Visit Summary.  MyChart is used to connect with patients for Virtual Visits (Telemedicine).  Patients are able to view lab/test results, encounter notes, upcoming appointments, etc.  Non-urgent messages can be sent to your provider as well.   To learn more about what you can do with MyChart, go to [email or web address].    Your next appointment:   6 month(s)  The format for your next appointment:   In Person  Provider:   Jenne Campus, MD    Other Instructions NA

## 2022-03-27 NOTE — Addendum Note (Signed)
Addended by: Jacobo Forest D on: 03/27/2022 08:27 AM   Modules accepted: Orders

## 2022-03-27 NOTE — Progress Notes (Signed)
Cardiology Office Note:    Date:  03/27/2022   ID:  Laurie Simmons, DOB 02/05/1966, MRN [id number]  PCP:  Marco Collie, MD  Cardiologist:  Jenne Campus, MD    Referring MD: Marco Collie, MD   Chief Complaint  Patient presents with   Follow-up  Doing fine  History of Present Illness:    Laurie Simmons is a 56 y.o. female with past medical history significant for atypical chest pain, palpitations.  As a part of evaluation she did have a stress test on stress to show ischemia in LAD territory, after that cardiac catheterization has been performed which showed normal LAD, there was up to 25% in the right coronary artery.  Additional problems include essential hypertension dyslipidemia and borderline diabetes. She is doing well.  She started being LB more active.  Denies have any chest pain tightness squeezing pressure burning chest and she is very pleased with the results of her cardiac catheterization  Past Medical History:  Diagnosis Date   Anxiety    Arthritis    Asthma    Breast cancer ([place]) 2021   Carpal tunnel syndrome    Carpal tunnel syndrome    bilateral   Chronic kidney disease    stage 3   Depression    Diabetes (HCC)    GERD (gastroesophageal reflux disease)    Hypertension    Irritable bowel syndrome    Severe anemia [id number]    Past Surgical History:  Procedure Laterality Date   caesarean     CHOLECYSTECTOMY     DILATION AND CURETTAGE OF UTERUS     HIP SURGERY Left    LEFT HEART CATH AND CORONARY ANGIOGRAPHY N/A 02/13/2022   Procedure: LEFT HEART CATH AND CORONARY ANGIOGRAPHY;  Surgeon: Jettie Booze, MD;  Location: [facility];  Service: Cardiovascular;  Laterality: N/A;   PORT A CATH INJECTION ([place] [facility])  2021    Current Medications: Current Meds  Medication Sig   acetaminophen (TYLENOL) 500 MG tablet Take 3 mg by mouth every 6 (six) hours as needed for moderate pain.   albuterol (VENTOLIN HFA) 108 (90 Base) MCG/ACT inhaler Inhale 2 puffs into  the lungs every 4 (four) hours as needed for wheezing or shortness of breath.   anastrozole (ARIMIDEX) 1 MG tablet TAKE 1 TABLET(1 MG) BY MOUTH DAILY (Patient taking differently: Take 1 mg by mouth daily.)   ARIPiprazole (ABILIFY) 2 MG tablet Take 1 tablet (2 mg total) by mouth daily.   Azelastine HCl 0.15 % SOLN Can use one spray in each nostril two times daily if needed. (Patient taking differently: Place 1 spray into the nose 2 (two) times daily. Can use one spray in each nostril two times daily if needed.)   Biotin 10000 MCG TBDP Take 10,000 mcg by mouth daily.   calcium carbonate (TUMS EX) 750 MG chewable tablet Chew 1,500 mg by mouth daily as needed for heartburn.   clorazepate (TRANXENE) 3.75 MG tablet Take one tablet up to four times daily as needed for anxiety/panic attacks. (Patient taking differently: Take 3.75 mg by mouth 4 (four) times daily as needed for anxiety or sleep. Take one tablet up to four times daily as needed for anxiety/panic attacks.)   Cyanocobalamin (B-12) 5000 MCG CAPS Take 5,000 mcg by mouth daily.   EPINEPHrine (EPIPEN 2-PAK) 0.3 mg/0.3 mL IJ SOAJ injection Use as directed for life-threatening allergic reaction. (Patient taking differently: Inject 0.3 mg into the muscle as needed for anaphylaxis. Use as  directed for life-threatening allergic reaction.)   famotidine (PEPCID) 40 MG tablet TAKE 1 TABLET BY MOUTH TWICE DAILY AS DIRECTED (Patient taking differently: Take 40 mg by mouth 2 (two) times daily. TAKE 1 TABLET BY MOUTH TWICE DAILY AS DIRECTED)   fluticasone (FLONASE) 50 MCG/ACT nasal spray Place 2 sprays into both nostrils daily as needed for allergies or rhinitis.   fluticasone-salmeterol (ADVAIR DISKUS) 250-50 MCG/ACT AEPB Inhale 1 puff into the lungs in the morning and at bedtime.   folic acid (FOLVITE) 1 MG tablet TAKE 1 TABLET BY MOUTH DAILY (Patient taking differently: Take 1 mg by mouth daily.)   gabapentin (NEURONTIN) 300 MG capsule Take 1 capsule (300 mg  total) by mouth 2 (two) times daily. (Patient taking differently: Take 300 mg by mouth at bedtime.)   loratadine (CLARITIN) 10 MG tablet Can take one tablet by mouth once daily if needed. (Patient taking differently: Take 10 mg by mouth daily.)   ondansetron (ZOFRAN) 4 MG tablet TAKE 1 TABLET BY MOUTH EVERY 4 HOURS AS NEEDED FOR NAUSEA (Patient taking differently: Take 4 mg by mouth every 4 (four) hours as needed for nausea or vomiting.)   OZEMPIC, 2 MG/DOSE, 8 MG/3ML SOPN Inject 2 mg into the skin every Sunday.   potassium chloride (KLOR-CON) 10 MEQ tablet TAKE 1 TABLET BY MOUTH TWICE DAILY (Patient taking differently: Take 10 mEq by mouth daily.)   prochlorperazine (COMPAZINE) 10 MG tablet TAKE 1 TABLET BY MOUTH EVERY 6 HOURS AS NEEDED FOR NAUSEA (Patient taking differently: Take 10 mg by mouth every 6 (six) hours as needed for nausea or vomiting.)   rosuvastatin (CRESTOR) 10 MG tablet Take 10 mg by mouth 2 (two) times a week. Tuesdays and Thursdays   senna-docusate (SENOKOT-S) 8.6-50 MG tablet Take 2 tablets by mouth daily.   topiramate (TOPAMAX) 50 MG tablet Take 1 tablet (50 mg total) by mouth daily.   UNABLE TO FIND Inject 1 each as directed See admin instructions. Allergy injections for cats, grass, and dust.   valsartan-hydrochlorothiazide (DIOVAN-HCT) 320-25 MG tablet Take 1 tablet by mouth daily.   Vilazodone HCl (VIIBRYD) 40 MG TABS TAKE 1 TABLET(40 MG) BY MOUTH DAILY (Patient taking differently: Take 40 mg by mouth daily. TAKE 1 TABLET(40 MG) BY MOUTH DAILY)   [DISCONTINUED] aspirin EC 81 MG tablet Take 81 mg by mouth daily. Swallow whole.   [DISCONTINUED] nitroGLYCERIN (NITROSTAT) 0.4 MG SL tablet Place 0.4 mg under the tongue every 5 (five) minutes as needed for chest pain.   [DISCONTINUED] sucralfate (CARAFATE) 1 g tablet Take 1 tablet (1 g total) by mouth 4 (four) times daily -  with meals and at bedtime.     Allergies:   Effexor [venlafaxine] and Nsaids   Social History    Socioeconomic History   Marital status: Married    Spouse name: Phil   Number of children: 2   Years of education: Not on file   Highest education level: Not on file  Occupational History   Not on file  Tobacco Use   Smoking status: Never   Smokeless tobacco: Never  Vaping Use   Vaping Use: Never used  Substance and Sexual Activity   Alcohol use: No   Drug use: No   Sexual activity: Yes  Other Topics Concern   Not on file  Social History Narrative   Not on file   Social Determinants of Health   Financial Resource Strain: Not on file  Food Insecurity: Not on file  Transportation Needs:  Not on file  Physical Activity: Not on file  Stress: Not on file  Social Connections: Not on file     Family History: The patient's family history includes Cancer in her maternal grandmother; Lung cancer in her paternal grandfather; Prostate cancer in her maternal grandfather; Pulmonary fibrosis in her father. ROS:   Please see the history of present illness.    All 14 point review of systems negative except as described per history of present illness  EKGs/Labs/Other Studies Reviewed:      Recent Labs: 01/23/2022: ALT 21 02/12/2022: BUN 16; Creatinine, Ser 1.36; Hemoglobin 15.0; Platelets 329; Potassium 3.9; Sodium 137  Recent Lipid Panel No results found for: "CHOL", "TRIG", "HDL", "CHOLHDL", "VLDL", "LDLCALC", "LDLDIRECT"  Physical Exam:    VS:  BP 128/84 (BP Location: Left Arm, Patient Position: Sitting)   Pulse (!) 106   Ht '5\' 5"'$  (1.651 m)   Wt 246 lb 9.6 oz (111.9 kg)   LMP 01/23/2011   SpO2 97%   BMI 41.04 kg/m     Wt Readings from Last 3 Encounters:  03/27/22 246 lb 9.6 oz (111.9 kg)  02/18/22 245 lb 6.4 oz (111.3 kg)  02/13/22 243 lb (110.2 kg)     GEN:  Well nourished, well developed in no acute distress HEENT: Normal NECK: No JVD; No carotid bruits LYMPHATICS: No lymphadenopathy CARDIAC: RRR, no murmurs, no rubs, no gallops RESPIRATORY:  Clear to  auscultation without rales, wheezing or rhonchi  ABDOMEN: Soft, non-tender, non-distended MUSCULOSKELETAL:  No edema; No deformity  SKIN: Warm and dry LOWER EXTREMITIES: no swelling NEUROLOGIC:  Alert and oriented x 3 PSYCHIATRIC:  Normal affect   ASSESSMENT:    1. Abnormal stress test: Anterior wall ischemia   2. Essential hypertension   3. Type 2 diabetes mellitus without complication, without long-term current use of insulin ([place])   4. Dyslipidemia    PLAN:    In order of problems listed above:  Coronary artery disease but only 25% RCA.  That was falsely abnormal stress test.  He is very pleased with the results of the test.  And since she does have coronary artery disease we will continue with aspirin 81 mg daily, I will also intensified her statin therapy.  We will increase Crestor to 10 mg every single day rather than twice a week like she is taking right now I did review K PN which show me her LDL 95 HDL 41.  Target will be LDL less than 70.  She does have coronary artery disease likely nonobstructive She will hypertension: Blood pressure well controlled continue present management. Diabetes her hemoglobin A1c is 6.0 last time in July.  Continue present management. We did talk about healthy lifestyle need to exercise on the regular basis as well as good diet and I did discuss basic of Mediterranean diet with her.   Medication Adjustments/Labs and Tests Ordered: Current medicines are reviewed at length with the patient today.  Concerns regarding medicines are outlined above.  No orders of the defined types were placed in this encounter.  Medication changes: No orders of the defined types were placed in this encounter.   Signed, Park Liter, MD, [facility] 03/27/2022 8:14 AM    [place]

## 2022-04-01 ENCOUNTER — Ambulatory Visit: Payer: BC Managed Care – PPO | Admitting: Psychiatry

## 2022-04-04 ENCOUNTER — Ambulatory Visit (INDEPENDENT_AMBULATORY_CARE_PROVIDER_SITE_OTHER): Payer: BC Managed Care – PPO | Admitting: Gastroenterology

## 2022-04-04 ENCOUNTER — Encounter: Payer: Self-pay | Admitting: Gastroenterology

## 2022-04-04 VITALS — BP 130/70 | HR 97 | Ht 65.0 in | Wt 249.5 lb

## 2022-04-04 DIAGNOSIS — Z1211 Encounter for screening for malignant neoplasm of colon: Secondary | ICD-10-CM

## 2022-04-04 DIAGNOSIS — K219 Gastro-esophageal reflux disease without esophagitis: Secondary | ICD-10-CM

## 2022-04-04 NOTE — Progress Notes (Signed)
HPI : Laurie Simmons is a very pleasant 56 year old female with history of breast cancer, chronic kidney disease, anxiety/depression, diabetes and obesity who is referred to [place] by Dr. Marco Collie for further evaluation and management of chronic GERD symptoms.  The patient states that she has had GERD for many years, but it has been well controlled until a recent flareup of symptoms.  She had taken Nexium for a long time, but was switched over to famotidine because of her renal insufficiency.  The famotidine had been working well, but she experienced 2 weeks of very bothersome reflux symptoms a little over a month ago.  Her symptoms consisted of acid regurgitation, burning pain in her epigastrium, chest and throat, excessive belching and hiccups.  The symptoms are worse in the evening.  She continues to take her famotidine twice daily despite the symptoms, but they did not improve.  Sucralfate was added, and this seemed to help her symptoms quite a bit.  She stopped taking the sucralfate a few weeks ago, and her symptoms have remained controlled on twice daily famotidine. She denies consumption of coffee, caffeine, alcohol.  She does eat chocolate on occasion.  She does admit to eating fatty and greasy foods when she eats out on the weekends, but does not recall specifically what meal triggered the worsening of her reflux symptoms at this time. Her weight has been stable.  She denies any dysphagia.  She has no family history of GI malignancy.  She is a lifetime non-smoker.  She reports having a colonoscopy around 2018 in [place], which she says was normal.  She says she was recommended to repeat in 10 years. She denies any chronic lower GI symptoms other than mild constipation which is managed with a stool softener.   Past Medical History:  Diagnosis Date   Anxiety    Arthritis    Asthma    Breast cancer ([place]) 2021   Carpal tunnel syndrome    Carpal tunnel syndrome    bilateral   Chronic kidney  disease    stage 3   Depression    Diabetes (HCC)    GERD (gastroesophageal reflux disease)    Hypertension    Irritable bowel syndrome    Obesity    Severe anemia 12/14/2015   Colonoscopy 2018?,  [place] per patient: Normal per patient, recommended repeat 10 years  Past Surgical History:  Procedure Laterality Date   caesarean     CESAREAN SECTION     CHOLECYSTECTOMY     DILATION AND CURETTAGE OF UTERUS     HIP SURGERY Left    LEFT HEART CATH AND CORONARY ANGIOGRAPHY N/A 02/13/2022   Procedure: LEFT HEART CATH AND CORONARY ANGIOGRAPHY;  Surgeon: Jettie Booze, MD;  Location: [facility];  Service: Cardiovascular;  Laterality: N/A;   MASTECTOMY     PORT A CATH INJECTION ([place] [facility])  2021   Family History  Problem Relation Age of Onset   Pulmonary fibrosis Father    Cancer Maternal Grandmother        carcinoma of the vulva and possible ovarian cancer   Prostate cancer Maternal Grandfather    Lung cancer Paternal Grandfather        heavy smoker   Social History   Tobacco Use   Smoking status: Never   Smokeless tobacco: Never  Vaping Use   Vaping Use: Never used  Substance Use Topics   Alcohol use: No   Drug use: No   Current Outpatient Medications  Medication Sig Dispense Refill   albuterol (VENTOLIN HFA) 108 (90 Base) MCG/ACT inhaler Inhale 2 puffs into the lungs every 4 (four) hours as needed for wheezing or shortness of breath. 18 g 1   anastrozole (ARIMIDEX) 1 MG tablet TAKE 1 TABLET(1 MG) BY MOUTH DAILY (Patient taking differently: Take 1 mg by mouth daily.) 90 tablet 3   ARIPiprazole (ABILIFY) 2 MG tablet Take 1 tablet (2 mg total) by mouth daily. 90 tablet 3   aspirin EC 81 MG tablet Take 1 tablet (81 mg total) by mouth daily. Swallow whole. 90 tablet 3   Azelastine HCl 0.15 % SOLN Can use one spray in each nostril two times daily if needed. (Patient taking differently: Place 1 spray into the nose 2 (two) times daily. Can use one spray in each  nostril two times daily if needed.) 90 mL 1   Biotin 10000 MCG TBDP Take 10,000 mcg by mouth daily.     calcium carbonate (TUMS EX) 750 MG chewable tablet Chew 1,500 mg by mouth daily as needed for heartburn.     clorazepate (TRANXENE) 3.75 MG tablet Take one tablet up to four times daily as needed for anxiety/panic attacks. (Patient taking differently: Take 3.75 mg by mouth 4 (four) times daily as needed for anxiety or sleep. Take one tablet up to four times daily as needed for anxiety/panic attacks.) 120 tablet 2   Cyanocobalamin (B-12) 5000 MCG CAPS Take 5,000 mcg by mouth daily.     EPINEPHrine (EPIPEN 2-PAK) 0.3 mg/0.3 mL IJ SOAJ injection Use as directed for life-threatening allergic reaction. (Patient taking differently: Inject 0.3 mg into the muscle as needed for anaphylaxis. Use as directed for life-threatening allergic reaction.) 1 each 3   famotidine (PEPCID) 40 MG tablet TAKE 1 TABLET BY MOUTH TWICE DAILY AS DIRECTED (Patient taking differently: Take 40 mg by mouth 2 (two) times daily. TAKE 1 TABLET BY MOUTH TWICE DAILY AS DIRECTED) 60 tablet 4   fluticasone (FLONASE) 50 MCG/ACT nasal spray Place 2 sprays into both nostrils daily as needed for allergies or rhinitis.     fluticasone-salmeterol (ADVAIR DISKUS) 250-50 MCG/ACT AEPB Inhale 1 puff into the lungs in the morning and at bedtime. 60 each 5   folic acid (FOLVITE) 1 MG tablet TAKE 1 TABLET BY MOUTH DAILY (Patient taking differently: Take 1 mg by mouth daily.) 30 tablet 5   gabapentin (NEURONTIN) 300 MG capsule Take 1 capsule (300 mg total) by mouth 2 (two) times daily. (Patient taking differently: Take 300 mg by mouth at bedtime.) 60 capsule 3   loratadine (CLARITIN) 10 MG tablet Can take one tablet by mouth once daily if needed. (Patient taking differently: Take 10 mg by mouth daily.) 90 tablet 1   ondansetron (ZOFRAN) 4 MG tablet TAKE 1 TABLET BY MOUTH EVERY 4 HOURS AS NEEDED FOR NAUSEA (Patient taking differently: Take 4 mg by mouth  every 4 (four) hours as needed for nausea or vomiting.) 30 tablet 0   OZEMPIC, 2 MG/DOSE, 8 MG/3ML SOPN Inject 2 mg into the skin every Sunday.     potassium chloride (KLOR-CON) 10 MEQ tablet TAKE 1 TABLET BY MOUTH TWICE DAILY (Patient taking differently: Take 10 mEq by mouth daily.) 60 tablet 1   prochlorperazine (COMPAZINE) 10 MG tablet TAKE 1 TABLET BY MOUTH EVERY 6 HOURS AS NEEDED FOR NAUSEA (Patient taking differently: Take 10 mg by mouth every 6 (six) hours as needed for nausea or vomiting.) 90 tablet 1   rosuvastatin (CRESTOR) 10 MG  tablet Take 1 tablet (10 mg total) by mouth daily. 90 tablet 3   senna-docusate (SENOKOT-S) 8.6-50 MG tablet Take 2 tablets by mouth daily.     topiramate (TOPAMAX) 50 MG tablet Take 1 tablet (50 mg total) by mouth daily. 90 tablet 3   UNABLE TO FIND Inject 1 each as directed See admin instructions. Allergy injections for cats, grass, and dust.     valsartan-hydrochlorothiazide (DIOVAN-HCT) 320-25 MG tablet Take 1 tablet by mouth daily.  0   Vilazodone HCl (VIIBRYD) 40 MG TABS TAKE 1 TABLET(40 MG) BY MOUTH DAILY (Patient taking differently: Take 40 mg by mouth daily. TAKE 1 TABLET(40 MG) BY MOUTH DAILY) 90 tablet 0   acetaminophen (TYLENOL) 500 MG tablet Take 3 mg by mouth every 6 (six) hours as needed for moderate pain. (Patient not taking: Reported on 04/04/2022)     No current facility-administered medications for this visit.   Allergies  Allergen Reactions   Effexor [Venlafaxine] Palpitations   Nsaids Other (See Comments)    Pt reports kidney disease      Review of Systems: All systems reviewed and negative except where noted in HPI.    No results found.  Physical Exam: Ht _0  (1.651 m)   Wt 249 lb 8 oz (113.2 kg)   LMP 01/23/2011   BMI 41.52 kg/m  Constitutional: Pleasant,well-developed, obese Caucasian female in no acute distress. HEENT: Normocephalic and atraumatic. Conjunctivae are normal. No scleral icterus. Cardiovascular: Normal  rate, regular rhythm.  Pulmonary/chest: Effort normal and breath sounds normal. No wheezing, rales or rhonchi. Abdominal: Soft, nondistended, nontender. Bowel sounds active throughout. There are no masses palpable. No hepatomegaly. Extremities: no edema Neurological: Alert and oriented to person place and time. Skin: Skin is warm and dry. No rashes noted. Psychiatric: Normal mood and affect. Behavior is normal.  CBC    Component Value Date/Time   WBC 8.5 02/12/2022 1035   WBC 12.3 (H) 10/22/2008 0530   RBC 5.15 02/12/2022 1035   RBC 5.03 01/23/2022 0000   HGB 15.0 02/12/2022 1035   HCT 43.9 02/12/2022 1035   PLT 329 02/12/2022 1035   MCV 85 02/12/2022 1035   MCV 87 11/07/2020 0000   MCH 29.1 02/12/2022 1035   MCHC 34.2 02/12/2022 1035   MCHC 33.7 10/22/2008 0530   RDW 13.1 02/12/2022 1035   LYMPHSABS 1.6 02/12/2022 1035   EOSABS 0.2 02/12/2022 1035   BASOSABS 0.1 02/12/2022 1035    CMP     Component Value Date/Time   NA 137 02/12/2022 1035   K 3.9 02/12/2022 1035   CL 97 02/12/2022 1035   CO2 26 02/12/2022 1035   GLUCOSE 112 (H) 02/12/2022 1035   GLUCOSE 97 10/21/2008 0545   BUN 16 02/12/2022 1035   CREATININE 1.36 (H) 02/12/2022 1035   CALCIUM 10.0 02/12/2022 1035   PROT 6.0 10/20/2008 2105   ALBUMIN 4.3 01/23/2022 0000   AST 23 01/23/2022 0000   ALT 21 01/23/2022 0000   ALKPHOS 95 01/23/2022 0000   BILITOT 0.2 (L) 10/20/2008 2105   GFRNONAA >60 10/20/2008 2105   GFRAA  10/20/2008 2105    >60        The eGFR has been calculated using the MDRD equation. This calculation has not been validated in all clinical situations. eGFR's persistently <60 mL/min signify possible Chronic Kidney Disease.     ASSESSMENT AND PLAN: 56 year old female with longstanding typical GERD symptoms, historically well controlled with twice daily famotidine, but with recent 2-week exacerbation of  symptoms, which eventually resolved with addition of sucralfate.  She stopped the  sucralfate several weeks ago, and her symptoms have remained well controlled with twice daily famotidine.  I agree with continuing famotidine over PPI given her chronic kidney disease.  We discussed lifestyle modifications that can help mitigate GERD symptoms to include weight loss, avoiding eating within 3 to 4 hours of bedtime/drinking within 2 hours, elevating head of bed and avoiding problem foods. Given her typical symptoms, absence of red flag symptoms such as dysphagia, unintentional weight loss or anemia and low risk for Barrett's esophagus/esophageal cancer, I do not think that an upper endoscopy is necessary at this time. Recommend continue medical therapy with famotidine, can also use as needed Tums or Gaviscon when symptoms return.  GERD -Continue Famotidine, agree with avoiding PPI; add OTC antacids or alginates as needed -Discussed lifestyle modifications, red flag symptoms that would warrant EGD -No indication for EGD currently  CRC screening -Due 2028, put in reminder   Laurie Simmons E. Candis Schatz, MD [facility]  CC:  Marco Collie, MD

## 2022-04-04 NOTE — Patient Instructions (Addendum)
  If you are age 54 or older, your body mass index should be between 23-30. Your Body mass index is 41.52 kg/m. If this is out of the aforementioned range listed, please consider follow up with your Primary Care Provider.  If you are age 69 or younger, your body mass index should be between 19-25. Your Body mass index is 41.52 kg/m. If this is out of the aformentioned range listed, please consider follow up with your Primary Care Provider.   Continue Famotidine twice daily and Gaviscon/tums/alka-seltzer as needed.   The  GI providers would like to encourage you to use [facility] to communicate with providers for non-urgent requests or questions.  Due to long hold times on the telephone, sending your provider a message by [facility] may be a faster and more efficient way to get a response.  Please allow 48 business hours for a response.  Please remember that this is for non-urgent requests.   It was a pleasure to see you today!  Thank you for trusting me with your gastrointestinal care!    Scott E.Candis Schatz, MD

## 2022-04-16 ENCOUNTER — Telehealth: Payer: Self-pay

## 2022-04-16 NOTE — Telephone Encounter (Signed)
I notified pt of Kelli Mosher,PA's recommendation below.Pt verbalized understanding.  Kelli Mosher,PA -yes, probably just vaginal atrophy, but need to be sure. Thanks  Pt last period was Dec 2021. She has been on tamoxifen then anastrozole since then. She has started having vaginal bleeding today. She wants to know if she should see her OBGYN? Above message sent to [facility] @ [phone].

## 2022-04-17 DIAGNOSIS — N950 Postmenopausal bleeding: Secondary | ICD-10-CM | POA: Diagnosis not present

## 2022-04-17 DIAGNOSIS — C50919 Malignant neoplasm of unspecified site of unspecified female breast: Secondary | ICD-10-CM | POA: Insufficient documentation

## 2022-04-17 DIAGNOSIS — D251 Intramural leiomyoma of uterus: Secondary | ICD-10-CM | POA: Diagnosis not present

## 2022-04-17 HISTORY — DX: Postmenopausal bleeding: N95.0

## 2022-04-17 HISTORY — DX: Malignant neoplasm of unspecified site of unspecified female breast: C50.919

## 2022-04-18 DIAGNOSIS — Z6841 Body Mass Index (BMI) 40.0 and over, adult: Secondary | ICD-10-CM | POA: Diagnosis not present

## 2022-04-18 DIAGNOSIS — U071 COVID-19: Secondary | ICD-10-CM | POA: Diagnosis not present

## 2022-04-18 DIAGNOSIS — Z20822 Contact with and (suspected) exposure to covid-19: Secondary | ICD-10-CM | POA: Diagnosis not present

## 2022-04-24 ENCOUNTER — Ambulatory Visit (INDEPENDENT_AMBULATORY_CARE_PROVIDER_SITE_OTHER): Payer: BC Managed Care – PPO | Admitting: *Deleted

## 2022-04-24 DIAGNOSIS — E1169 Type 2 diabetes mellitus with other specified complication: Secondary | ICD-10-CM | POA: Diagnosis not present

## 2022-04-24 DIAGNOSIS — N1831 Chronic kidney disease, stage 3a: Secondary | ICD-10-CM | POA: Diagnosis not present

## 2022-04-24 DIAGNOSIS — I10 Essential (primary) hypertension: Secondary | ICD-10-CM | POA: Diagnosis not present

## 2022-04-24 DIAGNOSIS — J309 Allergic rhinitis, unspecified: Secondary | ICD-10-CM | POA: Diagnosis not present

## 2022-04-30 ENCOUNTER — Ambulatory Visit (INDEPENDENT_AMBULATORY_CARE_PROVIDER_SITE_OTHER): Payer: 59 | Admitting: Psychiatry

## 2022-04-30 DIAGNOSIS — F411 Generalized anxiety disorder: Secondary | ICD-10-CM | POA: Diagnosis not present

## 2022-04-30 NOTE — Progress Notes (Signed)
[facility] Counselor/Therapist Progress Note  Patient ID: Laurie Simmons, MRN: [id number],    Date: 04/30/2022  Time Spent: 55 minutes   Treatment Type: Individual Therapy  Reported Symptoms:  anxiety, depression improved, frustration  Mental Status Exam:  Appearance:   Casual     Behavior:  Appropriate, Sharing, and Motivated  Motor:  Normal  Speech/Language:   Clear and Coherent  Affect:  anxious  Mood:  anxious  Thought process:  goal directed  Thought content:    Obsessive thoughts re: medical issues  Sensory/Perceptual disturbances:    WNL  Orientation:  oriented to person, place, time/date, situation, day of week, month of year, year, and stated date of Oct. 17,2023  Attention:  Fair  Concentration:  Fair  Memory:  [place] of knowledge:   Good  Insight:    Good and Fair  Judgment:   Good  Impulse Control:  Good   Risk Assessment: Danger to Self:  No Self-injurious Behavior: No Danger to Others: No Duty to Warn:no Physical Aggression / Violence:No  Access to Firearms a concern: No  Gang Involvement:No   Subjective:  Patient in today reporting symptoms of anxiety and frustration. Depression has decreased. Anxiety and frustration currently mostly related to health concerns including recent heart cath that revealed 25% blockage and patient was advised they don't do further intervention at this point but did advise her to increase her baby aspirin and cholesterol meds, use healthier diet and exercise. Also reports more stress due to "unexpected vaginal bleeding". To have followup appt on this Nov. 1 "and I've been worrying about that every day". No further bleeding since that . Not eating "good stuff and ate whatever I wanted, so need to get that back on track". Shared and processed her fears and anxiety about this recent health-related anxiety and worked with strategies that can help decrease her anxiety and fears. Talked some about her tendency to always assume the  worst in situations and encouraged some different ways of viewing anxiety-provoking situations that aren't not necessarily focused only on what might go badly. Looking at what helps and what doesn't help.  Wants to start reading her Bible more. Needing to follow through more on strategies/tools to see what may help her as she feels she has "tried many things without success."  Does agree to try some things that we've discussed today that she's not tried previously. Added that once she gets to sleep, she does sleep well. Positive self-talk reviewed, along with being more patient with herself and others.  Discussed some of her doctor recommendations about physical activity and looked there a way to "start small and experience some progress, leading up to being able to increase her activity".  Patient agrees to try it as well as practicing some calming activity before bedtime as that tends to be a time when her "assuming the worst" thinking happens the most.  Interventions: Cognitive Behavioral Therapy and Ego-Supportive  Long-term goal: Develop healthy cognitive patterns and beliefs about self and the world that lead to alleviation and help prevent the relapse of depressive symptoms Short-term goal: Replace negative self-defeating self-talk with verbalization of realistic and positive cognitive messages. Strategies: Reinforce positive, reality-based cognitive messages that enhance self-confidence and increase adaptive action.  Diagnosis:   ICD-10-CM   1. Generalized anxiety disorder  F41.1      Plan: Patient in today actively participating in session focusing on her anxiety, frustration, and a tendency to [facility] and assume the  worst.  Worked on improving her motivation and insight as to how certain behaviors tend to lead to certain thoughts and results, and helped patient in recognizing some of her negative behaviors and thoughts and how they impact her and lead her in a direction that is opposite  to what she wants and what her doctor is suggesting.  Good participation in session and able to work with some strategies that can help her improve not only mentally/emotionally but also impact her physical health more positively.  Depression decreased.  Still encouraged her being closer to other people and allowing some friendships outside the family that can benefit her emotional health.  Continued focus on "always assuming worst-case scenarios", trying to improve her confidence and decrease self-doubt, not automatically going to the negative side of things, and better managing stressful information in situations. Encouraged patient and her practicing more positive/encouraging behaviors including: Remaining in the present and focusing more on what she can change versus cannot change, try not to assume that things are always going in a negative and fearful direction, staying in touch with people who are supportive of her, getting outside some each day, remaining on her prescribed medication, healthy nutrition and exercise, try not to assume worst-case scenarios, positive self talk, reduce overthinking and overanalyzing, challenging counteract her self-doubt, and recognize the strength she shows working with goal-directed behaviors to move in a direction that supports her improved emotional health and overall wellbeing.  Goal review and progress/challenges noted with patient.  Next appointment within 3 weeks.  This record has been created using [employer].  Chart creation errors have been sought, but may not always have been located and corrected.  Such creation errors do not reflect on the standard of medical care provided.   Shanon Ace, LCSW

## 2022-05-01 DIAGNOSIS — E1169 Type 2 diabetes mellitus with other specified complication: Secondary | ICD-10-CM | POA: Diagnosis not present

## 2022-05-01 DIAGNOSIS — N1831 Chronic kidney disease, stage 3a: Secondary | ICD-10-CM | POA: Diagnosis not present

## 2022-05-01 DIAGNOSIS — E785 Hyperlipidemia, unspecified: Secondary | ICD-10-CM | POA: Diagnosis not present

## 2022-05-02 ENCOUNTER — Other Ambulatory Visit: Payer: Self-pay | Admitting: Allergy and Immunology

## 2022-05-07 DIAGNOSIS — E1169 Type 2 diabetes mellitus with other specified complication: Secondary | ICD-10-CM | POA: Diagnosis not present

## 2022-05-07 DIAGNOSIS — I10 Essential (primary) hypertension: Secondary | ICD-10-CM | POA: Diagnosis not present

## 2022-05-07 DIAGNOSIS — E785 Hyperlipidemia, unspecified: Secondary | ICD-10-CM | POA: Diagnosis not present

## 2022-05-08 DIAGNOSIS — E1169 Type 2 diabetes mellitus with other specified complication: Secondary | ICD-10-CM | POA: Diagnosis not present

## 2022-05-08 DIAGNOSIS — N1831 Chronic kidney disease, stage 3a: Secondary | ICD-10-CM | POA: Diagnosis not present

## 2022-05-08 DIAGNOSIS — E785 Hyperlipidemia, unspecified: Secondary | ICD-10-CM | POA: Diagnosis not present

## 2022-05-08 DIAGNOSIS — Z6841 Body Mass Index (BMI) 40.0 and over, adult: Secondary | ICD-10-CM | POA: Diagnosis not present

## 2022-05-08 LAB — LIPID PANEL
Chol/HDL Ratio: 3.5 ratio (ref 0.0–4.4)
Cholesterol, Total: 161 mg/dL (ref 100–199)
HDL: 46 mg/dL (ref >39)
LDL Chol Calc (NIH): 78 mg/dL (ref 0–99)
Triglycerides: 221 mg/dL — ABNORMAL HIGH (ref 0–149)
VLDL Cholesterol Cal: 37 mg/dL (ref 5–40)

## 2022-05-08 LAB — ALT: ALT: 14 IU/L (ref 0–32)

## 2022-05-08 LAB — AST: AST: 13 IU/L (ref 0–40)

## 2022-05-14 DIAGNOSIS — I10 Essential (primary) hypertension: Secondary | ICD-10-CM | POA: Diagnosis not present

## 2022-05-14 DIAGNOSIS — Z23 Encounter for immunization: Secondary | ICD-10-CM | POA: Diagnosis not present

## 2022-05-14 DIAGNOSIS — E1169 Type 2 diabetes mellitus with other specified complication: Secondary | ICD-10-CM | POA: Diagnosis not present

## 2022-05-14 DIAGNOSIS — Z6841 Body Mass Index (BMI) 40.0 and over, adult: Secondary | ICD-10-CM | POA: Diagnosis not present

## 2022-05-14 DIAGNOSIS — N1831 Chronic kidney disease, stage 3a: Secondary | ICD-10-CM | POA: Diagnosis not present

## 2022-05-14 DIAGNOSIS — E785 Hyperlipidemia, unspecified: Secondary | ICD-10-CM | POA: Diagnosis not present

## 2022-05-15 ENCOUNTER — Telehealth: Payer: Self-pay | Admitting: Cardiology

## 2022-05-15 ENCOUNTER — Telehealth: Payer: Self-pay

## 2022-05-15 DIAGNOSIS — N858 Other specified noninflammatory disorders of uterus: Secondary | ICD-10-CM | POA: Diagnosis not present

## 2022-05-15 DIAGNOSIS — N950 Postmenopausal bleeding: Secondary | ICD-10-CM | POA: Diagnosis not present

## 2022-05-15 MED ORDER — ROSUVASTATIN CALCIUM 20 MG PO TABS: 20.0000 mg | ORAL_TABLET | Freq: Every day | ORAL | 3 refills | Status: DC

## 2022-05-15 NOTE — Telephone Encounter (Signed)
Patient is returning call to discuss lab results. 

## 2022-05-15 NOTE — Telephone Encounter (Signed)
Results reviewed with pt as per Dr. Wendy Poet note. Pt agreed to increase Crestor to '20mg'$  daily and to follow up with fasting lab work in 6 weeks. Pt verbalized understanding and had no additional questions. Routed to PCP

## 2022-05-23 ENCOUNTER — Ambulatory Visit (INDEPENDENT_AMBULATORY_CARE_PROVIDER_SITE_OTHER): Payer: BC Managed Care – PPO | Admitting: *Deleted

## 2022-05-23 DIAGNOSIS — J309 Allergic rhinitis, unspecified: Secondary | ICD-10-CM | POA: Diagnosis not present

## 2022-05-24 ENCOUNTER — Encounter: Payer: Self-pay | Admitting: Adult Health

## 2022-05-24 ENCOUNTER — Ambulatory Visit (INDEPENDENT_AMBULATORY_CARE_PROVIDER_SITE_OTHER): Payer: 59 | Admitting: Adult Health

## 2022-05-24 DIAGNOSIS — F411 Generalized anxiety disorder: Secondary | ICD-10-CM

## 2022-05-24 DIAGNOSIS — F410 Panic disorder [episodic paroxysmal anxiety] without agoraphobia: Secondary | ICD-10-CM

## 2022-05-24 DIAGNOSIS — F39 Unspecified mood [affective] disorder: Secondary | ICD-10-CM

## 2022-05-24 DIAGNOSIS — F331 Major depressive disorder, recurrent, moderate: Secondary | ICD-10-CM

## 2022-05-24 MED ORDER — ARIPIPRAZOLE 2 MG PO TABS: 2.0000 mg | ORAL_TABLET | Freq: Every day | ORAL | 3 refills | Status: DC

## 2022-05-24 MED ORDER — VILAZODONE HCL 40 MG PO TABS: 40.0000 mg | ORAL_TABLET | Freq: Every day | ORAL | 3 refills | Status: DC

## 2022-05-24 MED ORDER — CLORAZEPATE DIPOTASSIUM 3.75 MG PO TABS: ORAL_TABLET | ORAL | 2 refills | Status: DC

## 2022-05-24 MED ORDER — TOPIRAMATE 50 MG PO TABS: 50.0000 mg | ORAL_TABLET | Freq: Every day | ORAL | 3 refills | Status: DC

## 2022-05-24 NOTE — Progress Notes (Signed)
EMALEA MIX [id number] Aug 02, 1965 56 y.o.  Subjective:   Patient ID:  Laurie Simmons is a 56 y.o. (DOB [birth year]) female.  Chief Complaint: No chief complaint on file.   HPI Laurie Simmons presents to the office today for follow-up of anxiety, panic attacks, depression, and mood disorder.  Describes mood today as "ok". Pleasant. Decreased tearfulness. Mood symptoms - reports some anxiety,   depression and irritability. Reports worry and rumination. Reports over thinking. Denies recent panic attacks. Mood is mostly consistent - reporting some mood swings - more out of worry and frustration. Feels like current medications are helpful. Working with a Transport planner. Stable interest and motivation. Taking medications as prescribed. Energy levels lower. Active, does not have a regular exercise routine with current physical disabilities.  Enjoys some usual interests and activities. Married. Lives with husband and their two children. Mostly staying home. Appetite adequate. Weight gain - 258 pounds. Working with weight loss management - restarting Ozempic. Sleeps better some nights than others. Averages 7 hours. Napping during the day - afternoons. Focus and concentration difficulties. Trying to get things done around the house - "failing miserably". Completing some tasks - pushing herself. Currently disabled and receiving SSI benefits. Denies SI or HI.  Denies AH or VH.   Reports post Covid symptoms.   Review of Systems:  Review of Systems  Musculoskeletal:  Negative for gait problem.  Neurological:  Negative for tremors.  Psychiatric/Behavioral:         Please refer to HPI    Medications: I have reviewed the patient's current medications.  Current Outpatient Medications  Medication Sig Dispense Refill   albuterol (VENTOLIN HFA) 108 (90 Base) MCG/ACT inhaler INHALE 2 PUFFS INTO THE LUNGS EVERY 4 TO 6 HOURS AS NEEDED FOR COUGH OR WHEEZING 18 g 1   anastrozole (ARIMIDEX) 1 MG tablet TAKE 1 TABLET(1  MG) BY MOUTH DAILY 90 tablet 3     Flowsheet Row Admission (Discharged) from 02/13/2022 in [place] [facility]  C-SSRS RISK CATEGORY No Risk        Review of Systems:  Review of Systems  Respiratory:  Negative for wheezing.   Musculoskeletal:  Negative for gait problem.  Neurological:  Negative for tremors.  Psychiatric/Behavioral:         Please refer to HPI    Medications: I have reviewed the patient's current medications.  Current Outpatient Medications  Medication Sig Dispense Refill   acetaminophen (TYLENOL) 500 MG tablet Take 3 mg by mouth every 6 (six) hours as needed for moderate pain. (Patient not taking: Reported on 04/04/2022)     albuterol (VENTOLIN HFA) 108 (90 Base) MCG/ACT inhaler Inhale 2 puffs into the lungs every 4 (four) hours as needed for wheezing or shortness of breath. 18 g 1   anastrozole (ARIMIDEX) 1 MG tablet TAKE 1 TABLET(1 MG) BY MOUTH DAILY (Patient taking differently: Take 1 mg by mouth daily.) 90 tablet 3   ARIPiprazole (ABILIFY) 2 MG tablet Take 1 tablet (2 mg total) by mouth daily. 90 tablet 3   aspirin EC 81 MG tablet Take 1 tablet (81 mg total) by mouth daily. Swallow whole. 90 tablet 3   Azelastine HCl 0.15 % SOLN Can use one spray in each nostril two times daily if needed. (Patient taking differently: Place 1 spray into the nose 2 (two) times daily. Can use one spray in each nostril two times daily if needed.) 90 mL 1   Biotin 10000 MCG TBDP Take 10,000  mcg by mouth daily.     calcium carbonate (TUMS EX) 750 MG chewable tablet Chew 1,500 mg by mouth daily as needed for heartburn.     clorazepate (TRANXENE) 3.75 MG tablet Take one tablet up to four times daily as needed for anxiety/panic attacks. (Patient taking differently: Take 3.75 mg by mouth 4 (four) times daily as needed for anxiety or sleep. Take one tablet up to four times daily as needed for anxiety/panic attacks.) 120 tablet 2   Cyanocobalamin (B-12) 5000 MCG CAPS  Take 5,000 mcg by mouth daily.     EPINEPHrine (EPIPEN 2-PAK) 0.3 mg/0.3 mL IJ SOAJ injection Use as directed for life-threatening allergic reaction. (Patient taking differently: Inject 0.3 mg into the muscle as needed for anaphylaxis. Use as directed for life-threatening allergic reaction.) 1 each 3   famotidine (PEPCID) 40 MG tablet TAKE 1 TABLET BY MOUTH TWICE DAILY AS DIRECTED 60 tablet 5   fluticasone (FLONASE) 50 MCG/ACT nasal spray Place 2 sprays into both nostrils daily as needed for allergies or rhinitis.     fluticasone-salmeterol (ADVAIR DISKUS) 250-50 MCG/ACT AEPB Inhale 1 puff into the lungs in the morning and at bedtime. 60 each 5   folic acid (FOLVITE) 1 MG tablet TAKE 1 TABLET BY MOUTH DAILY (Patient taking differently: Take 1 mg by mouth daily.) 30 tablet 5   gabapentin (NEURONTIN) 300 MG capsule Take 1 capsule (300 mg total) by mouth 2 (two) times daily. (Patient taking differently: Take 300 mg by mouth at bedtime.) 60 capsule 3   loratadine (CLARITIN) 10 MG tablet Can take one tablet by mouth once daily if needed. (Patient taking differently: Take 10 mg by mouth daily.) 90 tablet 1   ondansetron (ZOFRAN) 4 MG tablet TAKE 1 TABLET BY MOUTH EVERY 4 HOURS AS NEEDED FOR NAUSEA (Patient taking differently: Take 4 mg by mouth every 4 (four) hours as needed for nausea or vomiting.) 30 tablet 0   OZEMPIC, 2 MG/DOSE, 8 MG/3ML SOPN Inject 2 mg into the skin every Sunday.     potassium chloride (KLOR-CON) 10 MEQ tablet TAKE 1 TABLET BY MOUTH TWICE DAILY (Patient taking differently: Take 10 mEq by mouth daily.) 60 tablet 1   prochlorperazine (COMPAZINE) 10 MG tablet TAKE 1 TABLET BY MOUTH EVERY 6 HOURS AS NEEDED FOR NAUSEA (Patient taking differently: Take 10 mg by mouth every 6 (six) hours as needed for nausea or vomiting.) 90 tablet 1   rosuvastatin (CRESTOR) 20 MG tablet Take 1 tablet (20 mg total) by mouth daily. 90 tablet 3   senna-docusate (SENOKOT-S) 8.6-50 MG tablet Take 2 tablets by  mouth daily.     topiramate (TOPAMAX) 50 MG tablet Take 1 tablet (50 mg total) by mouth daily. 90 tablet 3   UNABLE TO FIND Inject 1 each as directed See admin instructions. Allergy injections for cats, grass, and dust.     valsartan-hydrochlorothiazide (DIOVAN-HCT) 320-25 MG tablet Take 1 tablet by mouth daily.  0   Vilazodone HCl (VIIBRYD) 40 MG TABS TAKE 1 TABLET(40 MG) BY MOUTH DAILY (Patient taking differently: Take 40 mg by mouth daily. TAKE 1 TABLET(40 MG) BY MOUTH DAILY) 90 tablet 0   No current facility-administered medications for this visit.    Medication Side Effects: None  Allergies:  Allergies  Allergen Reactions   Effexor [Venlafaxine] Palpitations   Nsaids Other (See Comments)    Pt reports kidney disease     Past Medical History:  Diagnosis Date   Anxiety    Arthritis  Asthma    Breast cancer ([place]) 2021   Carpal tunnel syndrome    Carpal tunnel syndrome    bilateral   Chronic kidney disease    stage 3   Depression    Diabetes (HCC)    GERD (gastroesophageal reflux disease)    Hypertension    Irritable bowel syndrome    Obesity    Severe anemia 12/14/2015    Past Medical History, Surgical history, Social history, and Family history were reviewed and updated as appropriate.   Please see review of systems for further details on the patient's review from today.   Objective:   Physical Exam:  LMP 01/23/2011   Physical Exam Constitutional:      General: She is not in acute distress. Musculoskeletal:        General: No deformity.  Neurological:     Mental Status: She is alert and oriented to person, place, and time.     Coordination: Coordination normal.  Psychiatric:        Attention and Perception: Attention and perception normal. She does not perceive auditory or visual hallucinations.        Mood and Affect: Mood normal. Mood is not anxious or depressed. Affect is not labile, blunt, angry or inappropriate.        Speech: Speech normal.         Behavior: Behavior normal.        Thought Content: Thought content normal. Thought content is not paranoid or delusional. Thought content does not include homicidal or suicidal ideation. Thought content does not include homicidal or suicidal plan.        Cognition and Memory: Cognition and memory normal.        Judgment: Judgment normal.     Comments: Insight intact     Lab Review:     Component Value Date/Time   NA 137 02/12/2022 1035   K 3.9 02/12/2022 1035   CL 97 02/12/2022 1035   CO2 26 02/12/2022 1035   GLUCOSE 112 (H) 02/12/2022 1035   GLUCOSE 97 10/21/2008 0545   BUN 16 02/12/2022 1035   CREATININE 1.36 (H) 02/12/2022 1035   CALCIUM 10.0 02/12/2022 1035   PROT 6.0 10/20/2008 2105   ALBUMIN 4.3 01/23/2022 0000   AST 13 05/07/2022 0810   ALT 14 05/07/2022 0810   ALKPHOS 95 01/23/2022 0000   BILITOT 0.2 (L) 10/20/2008 2105   GFRNONAA >60 10/20/2008 2105   GFRAA  10/20/2008 2105    >60        The eGFR has been calculated using the MDRD equation. This calculation has not been validated in all clinical situations. eGFR's persistently <60 mL/min signify possible Chronic Kidney Disease.       Component Value Date/Time   WBC 8.5 02/12/2022 1035   WBC 12.3 (H) 10/22/2008 0530   RBC 5.15 02/12/2022 1035   RBC 5.03 01/23/2022 0000   HGB 15.0 02/12/2022 1035   HCT 43.9 02/12/2022 1035   PLT 329 02/12/2022 1035   MCV 85 02/12/2022 1035   MCV 87 11/07/2020 0000   MCH 29.1 02/12/2022 1035   MCHC 34.2 02/12/2022 1035   MCHC 33.7 10/22/2008 0530   RDW 13.1 02/12/2022 1035   LYMPHSABS 1.6 02/12/2022 1035   EOSABS 0.2 02/12/2022 1035   BASOSABS 0.1 02/12/2022 1035    No results found for: "POCLITH", "LITHIUM"   No results found for: "PHENYTOIN", "PHENOBARB", "VALPROATE", "CBMZ"   .res Assessment: Plan:    Plan:  1. Viibryd 42m  daily 2. DecreaseTranxene 3.54m up to 4 times daily to once daily 3. Topamax 50 mg daily 4. Abilify 223mdaily  RTC 4  months  Patient advised to contact office with any questions, adverse effects, or acute worsening in signs and symptoms.  Discussed potential benefits, risk, and side effects of benzodiazepines to include potential risk of tolerance and dependence, as well as possible drowsiness. Advised patient not to drive if experiencing drowsiness and to take lowest possible effective dose to minimize risk of dependence and tolerance.  Discussed potential metabolic side effects associated with atypical antipsychotics, as well as potential risk for movement side effects. Advised pt to contact office if movement side effects occur.    There are no diagnoses linked to this encounter.   Please see After Visit Summary for patient specific instructions.  Future Appointments  Date Time Provider [place]11/04/2022  9:40 AM Miyah Hampshire, ReBerdie OgrenNP [facility] None  05/27/2022  2:00 PM [facility] None  05/27/2022  2:30 PM McDerwood KaplanMD [facility] None  05/29/2022 10:00 AM DoShanon AceLCSW CP-CP None  06/24/2022 10:00 AM DoShanon AceLCSW [facility] None  06/27/2022  8:30 AM Kozlow, ErDonnamarie PoagMD [facility] None  09/30/2022  8:00 AM KrPark LiterMD [facility] None    No orders of the defined types were placed in this encounter.   -------------------------------

## 2022-05-26 ENCOUNTER — Other Ambulatory Visit: Payer: Self-pay | Admitting: Oncology

## 2022-05-26 DIAGNOSIS — C773 Secondary and unspecified malignant neoplasm of axilla and upper limb lymph nodes: Secondary | ICD-10-CM

## 2022-05-26 NOTE — Progress Notes (Signed)
Patient Care Team: Marco Collie, MD as PCP - General (Family Medicine) [name], MD as Obstetrician (Obstetrics and Gynecology) [name], MD as Referring Physician (Surgery) Derwood Kaplan, MD as Consulting Physician (Oncology) Gatha Mayer, MD as Consulting Physician (Radiation Oncology)  Clinic Day:  05/27/22  Referring physician: Marco Collie, MD  ASSESSMENT & PLAN:   Assessment & Plan: Secondary malignant neoplasm of axillary lymph nodes [facility])  Clinical stage IIB (TXN2M0), grade 3, ductal carcinoma with no breast primary, diagnosed in February 2021.  She received neoadjuvant chemotherapy with docetaxel/doxorubicin/cyclophosphamide completed in July.  She underwent left mastectomy and axillary dissection, with no breast primary found and 9/26 lymph nodes positive for metastasis. She completed adjuvant radiation at the end of November, and was placed on hormonal therapy with tamoxifen at the end of December. Baseline bone density scan was completely normal. She was unable to tolerate tamoxifen and was switched to anastrazole. She is tolerating this well with minimal side effects, mainly muscle and joint pain and fatigue.   I will check her thyroid in view of her severe fatigue. She will return to clinic in 4 months with repeat evaluation.   The patient understands the plans discussed today and is in agreement with them.  She knows to contact our office if she develops concerns prior to her next appointment.      Derwood Kaplan, MD  [facility] [address] [place] [address] Dept: [phone] Dept Fax: [phone]   No orders of the defined types were placed in this encounter.     CHIEF COMPLAINT:  CC: A 56 year old female with history of breast cancer here for 4 month evaluation.   Current Treatment:  Anastrazole  INTERVAL HISTORY:  Laurie Simmons is here today for repeat  clinical assessment. She complains of severe fatigue but has also gained a lot of weight. She had her annual mammogram and surgeon follow up in August. She had [place] in May. She still complains of tingling in her hands and takes gabapentin at bedtime. Her Crestor dose was increased and she is now on Aspirin 81 mg daily. Her labs are pending so I will call her with those results. I will add TSH in view of her severe fatigue. She denies fevers or chills. She denies pain. Her appetite is good. Her weight has increased 13 pounds.  I have reviewed the past medical history, past surgical history, social history and family history with the patient and they are unchanged from previous note.  ALLERGIES:  is allergic to effexor [venlafaxine] and nsaids.  MEDICATIONS:  Current Outpatient Medications  Medication Sig Dispense Refill   acetaminophen (TYLENOL) 500 MG tablet Take 3 mg by mouth every 6 (six) hours as needed for moderate pain. (Patient not taking: Reported on 04/04/2022)     albuterol (VENTOLIN HFA) 108 (90 Base) MCG/ACT inhaler Inhale 2 puffs into the lungs every 4 (four) hours as needed for wheezing or shortness of breath. 18 g 1   anastrozole (ARIMIDEX) 1 MG tablet TAKE 1 TABLET(1 MG) BY MOUTH DAILY (Patient taking differently: Take 1 mg by mouth daily.) 90 tablet 3   ARIPiprazole (ABILIFY) 2 MG tablet Take 1 tablet (2 mg total) by mouth daily. 90 tablet 3   aspirin EC 81 MG tablet Take 1 tablet (81 mg total) by mouth daily. Swallow whole. 90 tablet 3   Azelastine HCl 0.15 % SOLN Can use one spray in each nostril  two times daily if needed. 90 mL 1   Biotin 10000 MCG TBDP Take 10,000 mcg by mouth daily.     calcium carbonate (TUMS EX) 750 MG chewable tablet Chew 1,500 mg by mouth daily as needed for heartburn.     clorazepate (TRANXENE) 3.75 MG tablet Take one tablet daily as needed for anxiety/panic attacks. 30 tablet 2   Cyanocobalamin (B-12) 5000 MCG CAPS Take 5,000 mcg by mouth daily.      EPINEPHrine (EPIPEN 2-PAK) 0.3 mg/0.3 mL IJ SOAJ injection Use as directed for life-threatening allergic reaction. (Patient taking differently: Inject 0.3 mg into the muscle as needed for anaphylaxis. Use as directed for life-threatening allergic reaction.) 1 each 3   famotidine (PEPCID) 40 MG tablet TAKE 1 TABLET BY MOUTH TWICE DAILY AS DIRECTED 60 tablet 5   fluticasone (FLONASE) 50 MCG/ACT nasal spray Place 2 sprays into both nostrils daily as needed for allergies or rhinitis.     fluticasone-salmeterol (ADVAIR DISKUS) 250-50 MCG/ACT AEPB Inhale 1 puff into the lungs in the morning and at bedtime. 60 each 5   folic acid (FOLVITE) 1 MG tablet TAKE 1 TABLET BY MOUTH DAILY (Patient taking differently: Take 1 mg by mouth daily.) 30 tablet 5   gabapentin (NEURONTIN) 300 MG capsule Take 1 capsule (300 mg total) by mouth 2 (two) times daily. (Patient taking differently: Take 300 mg by mouth at bedtime.) 60 capsule 3   loratadine (CLARITIN) 10 MG tablet Can take one tablet by mouth once daily if needed. (Patient taking differently: Take 10 mg by mouth daily.) 90 tablet 1   ondansetron (ZOFRAN) 4 MG tablet TAKE 1 TABLET BY MOUTH EVERY 4 HOURS AS NEEDED FOR NAUSEA (Patient taking differently: Take 4 mg by mouth every 4 (four) hours as needed for nausea or vomiting.) 30 tablet 0   OZEMPIC, 2 MG/DOSE, 8 MG/3ML SOPN Inject 2 mg into the skin every Sunday.     potassium chloride (KLOR-CON) 10 MEQ tablet TAKE 1 TABLET BY MOUTH TWICE DAILY (Patient taking differently: Take 10 mEq by mouth daily.) 60 tablet 1   prochlorperazine (COMPAZINE) 10 MG tablet TAKE 1 TABLET BY MOUTH EVERY 6 HOURS AS NEEDED FOR NAUSEA (Patient taking differently: Take 10 mg by mouth every 6 (six) hours as needed for nausea or vomiting.) 90 tablet 1   rosuvastatin (CRESTOR) 20 MG tablet Take 1 tablet (20 mg total) by mouth daily. 90 tablet 3   senna-docusate (SENOKOT-S) 8.6-50 MG tablet Take 2 tablets by mouth daily.     topiramate (TOPAMAX)  50 MG tablet Take 1 tablet (50 mg total) by mouth daily. 90 tablet 3   UNABLE TO FIND Inject 1 each as directed See admin instructions. Allergy injections for cats, grass, and dust.     valsartan-hydrochlorothiazide (DIOVAN-HCT) 320-25 MG tablet Take 1 tablet by mouth daily.  0   Vilazodone HCl (VIIBRYD) 40 MG TABS Take 1 tablet (40 mg total) by mouth daily. TAKE 1 TABLET(40 MG) BY MOUTH DAILY 90 tablet 3   No current facility-administered medications for this visit.    HISTORY OF PRESENT ILLNESS:   Oncology History  Secondary malignant neoplasm of axillary lymph nodes ([place])  08/16/2019 Cancer Staging   Staging form: Breast, AJCC 8th Edition - Clinical stage from 08/16/2019: Stage IIIA (cT0, cN2(f), cM0, G3, ER+, PR+, HER2-) - Signed by Derwood Kaplan, MD on 06/28/2020 Staging comments: TAC x 6 as neoadjuvant chemo   08/23/2019 Initial Diagnosis   Secondary malignant neoplasm of axillary lymph  nodes ([place])   03/15/2020 Cancer Staging   Staging form: Breast, AJCC 8th Edition - Pathologic stage from 03/15/2020: No Stage Recommended (ypT0, pN2a, cM0, G3, ER+, PR+, HER2-) - Signed by Derwood Kaplan, MD on 06/28/2020 Staging comments: Mastectomy with no primary found, postop radiation, then hormonal Rx       REVIEW OF SYSTEMS:   Constitutional: Denies fevers, chills or abnormal weight loss Eyes: Denies blurriness of vision Ears, nose, mouth, throat, and face: Denies mucositis or sore throat Respiratory: Denies cough, dyspnea or wheezes Cardiovascular: Denies palpitation, chest discomfort or lower extremity swelling Gastrointestinal:  Denies nausea, heartburn or change in bowel habits Skin: Denies abnormal skin rashes Lymphatics: Denies new lymphadenopathy or easy bruising Neurological:Denies numbness, tingling or new weaknesses Behavioral/Psych: Mood is stable, no new changes  All other systems were reviewed with the patient and are negative.   VITALS:  Blood pressure (!)  146/88, pulse 100, temperature 98.1 F (36.7 C), temperature source Oral, resp. rate 18, height _0  (1.651 m), weight 262 lb 8 oz (119.1 kg), last menstrual period 01/23/2011, SpO2 96 %.  Wt Readings from Last 3 Encounters:  05/27/22 262 lb 8 oz (119.1 kg)  04/04/22 249 lb 8 oz (113.2 kg)  03/27/22 246 lb 9.6 oz (111.9 kg)    Body mass index is 43.68 kg/m.  Performance status (ECOG): 1 - Symptomatic but completely ambulatory  PHYSICAL EXAM:   GENERAL:alert, no distress and comfortable SKIN: skin color, texture, turgor are normal, no rashes or significant lesions EYES: normal, Conjunctiva are pink and non-injected, sclera clear OROPHARYNX:no exudate, no erythema and lips, buccal mucosa, and tongue normal  NECK: supple, thyroid normal size, non-tender, without nodularity LYMPH:  no palpable lymphadenopathy in the cervical, axillary or inguinal LUNGS: clear to auscultation and percussion with normal breathing effort HEART: regular rate & rhythm and no murmurs and no lower extremity edema ABDOMEN:abdomen soft, non-tender and normal bowel sounds Musculoskeletal:no cyanosis of digits and no clubbing  NEURO: alert & oriented x 3 with fluent speech, no focal motor/sensory deficits  LABORATORY DATA:  I have reviewed the data as listed    Component Value Date/Time   NA 141 05/27/2022 1407   NA 137 02/12/2022 1035   K 3.5 05/27/2022 1407   CL 108 05/27/2022 1407   CO2 25 05/27/2022 1407   GLUCOSE 156 (H) 05/27/2022 1407   BUN 18 05/27/2022 1407   BUN 16 02/12/2022 1035   CREATININE 1.07 (H) 05/27/2022 1407   CALCIUM 9.0 05/27/2022 1407   PROT 7.5 05/27/2022 1407   ALBUMIN 3.7 05/27/2022 1407   AST 16 05/27/2022 1407   ALT 20 05/27/2022 1407   ALKPHOS 82 05/27/2022 1407   BILITOT 0.4 05/27/2022 1407   GFRNONAA >60 05/27/2022 1407   GFRAA  10/20/2008 2105    >60        The eGFR has been calculated using the MDRD equation. This calculation has not been validated in all  clinical situations. eGFR's persistently <60 mL/min signify possible Chronic Kidney Disease.    No results found for: "SPEP", "UPEP"  Lab Results  Component Value Date   WBC 10.1 05/27/2022   NEUTROABS 7.1 05/27/2022   HGB 13.7 05/27/2022   HCT 41.4 05/27/2022   MCV 85.4 05/27/2022   PLT 347 05/27/2022      Chemistry      Component Value Date/Time   NA 141 05/27/2022 1407   NA 137 02/12/2022 1035   K 3.5 05/27/2022 1407   CL  108 05/27/2022 1407   CO2 25 05/27/2022 1407   BUN 18 05/27/2022 1407   BUN 16 02/12/2022 1035   CREATININE 1.07 (H) 05/27/2022 1407   GLU 97 01/23/2022 0000      Component Value Date/Time   CALCIUM 9.0 05/27/2022 1407   ALKPHOS 82 05/27/2022 1407   AST 16 05/27/2022 1407   ALT 20 05/27/2022 1407   BILITOT 0.4 05/27/2022 1407       RADIOGRAPHIC STUDIES: I have personally reviewed the radiological images as listed and agreed with the findings in the report. No results found.

## 2022-05-27 ENCOUNTER — Inpatient Hospital Stay: Payer: BC Managed Care – PPO | Attending: Oncology | Admitting: Oncology

## 2022-05-27 ENCOUNTER — Telehealth: Payer: Self-pay | Admitting: Oncology

## 2022-05-27 ENCOUNTER — Encounter: Payer: Self-pay | Admitting: Oncology

## 2022-05-27 ENCOUNTER — Other Ambulatory Visit: Payer: Self-pay | Admitting: Oncology

## 2022-05-27 ENCOUNTER — Inpatient Hospital Stay: Payer: BC Managed Care – PPO

## 2022-05-27 VITALS — BP 146/88 | HR 100 | Temp 98.1°F | Resp 18 | Ht 65.0 in | Wt 262.5 lb

## 2022-05-27 DIAGNOSIS — C801 Malignant (primary) neoplasm, unspecified: Secondary | ICD-10-CM | POA: Diagnosis not present

## 2022-05-27 DIAGNOSIS — Z79811 Long term (current) use of aromatase inhibitors: Secondary | ICD-10-CM | POA: Diagnosis not present

## 2022-05-27 DIAGNOSIS — R635 Abnormal weight gain: Secondary | ICD-10-CM

## 2022-05-27 DIAGNOSIS — C773 Secondary and unspecified malignant neoplasm of axilla and upper limb lymph nodes: Secondary | ICD-10-CM

## 2022-05-27 DIAGNOSIS — Z170 Estrogen receptor positive status [ER+]: Secondary | ICD-10-CM | POA: Insufficient documentation

## 2022-05-27 DIAGNOSIS — Z79899 Other long term (current) drug therapy: Secondary | ICD-10-CM | POA: Insufficient documentation

## 2022-05-27 LAB — CBC WITH DIFFERENTIAL (CANCER CENTER ONLY)
Abs Immature Granulocytes: 0.04 10*3/uL (ref 0.00–0.07)
Basophils Absolute: 0.1 10*3/uL (ref 0.0–0.1)
Basophils Relative: 1 %
Eosinophils Absolute: 0.4 10*3/uL (ref 0.0–0.5)
Eosinophils Relative: 4 %
HCT: 41.4 % (ref 36.0–46.0)
Hemoglobin: 13.7 g/dL (ref 12.0–15.0)
Immature Granulocytes: 0 %
Lymphocytes Relative: 19 %
Lymphs Abs: 1.9 10*3/uL (ref 0.7–4.0)
MCH: 28.2 pg (ref 26.0–34.0)
MCHC: 33.1 g/dL (ref 30.0–36.0)
MCV: 85.4 fL (ref 80.0–100.0)
Monocytes Absolute: 0.7 10*3/uL (ref 0.1–1.0)
Monocytes Relative: 7 %
Neutro Abs: 7.1 10*3/uL (ref 1.7–7.7)
Neutrophils Relative %: 69 %
Platelet Count: 347 10*3/uL (ref 150–400)
RBC: 4.85 MIL/uL (ref 3.87–5.11)
RDW: 13.7 % (ref 11.5–15.5)
WBC Count: 10.1 10*3/uL (ref 4.0–10.5)
nRBC: 0 % (ref 0.0–0.2)

## 2022-05-27 LAB — CMP (CANCER CENTER ONLY)
ALT: 20 U/L (ref 0–44)
AST: 16 U/L (ref 15–41)
Albumin: 3.7 g/dL (ref 3.5–5.0)
Alkaline Phosphatase: 82 U/L (ref 38–126)
Anion gap: 8 (ref 5–15)
BUN: 18 mg/dL (ref 6–20)
CO2: 25 mmol/L (ref 22–32)
Calcium: 9 mg/dL (ref 8.9–10.3)
Chloride: 108 mmol/L (ref 98–111)
Creatinine: 1.07 mg/dL — ABNORMAL HIGH (ref 0.44–1.00)
GFR, Estimated: >60 mL/min (ref >60)
Glucose, Bld: 156 mg/dL — ABNORMAL HIGH (ref 70–99)
Potassium: 3.5 mmol/L (ref 3.5–5.1)
Sodium: 141 mmol/L (ref 135–145)
Total Bilirubin: 0.4 mg/dL (ref 0.3–1.2)
Total Protein: 7.5 g/dL (ref 6.5–8.1)

## 2022-05-27 LAB — TSH: TSH: 2.716 u[IU]/mL (ref 0.350–4.500)

## 2022-05-27 NOTE — Telephone Encounter (Signed)
Patient has been scheduled for follow-up visit per 05/27/22 los. Pt given an appt calendar with date and time.  

## 2022-05-28 LAB — T4: T4, Total: 4.9 ug/dL (ref 4.5–12.0)

## 2022-05-29 ENCOUNTER — Encounter: Payer: Self-pay | Admitting: Psychiatry

## 2022-05-29 ENCOUNTER — Ambulatory Visit (INDEPENDENT_AMBULATORY_CARE_PROVIDER_SITE_OTHER): Payer: 59 | Admitting: Psychiatry

## 2022-05-29 DIAGNOSIS — F411 Generalized anxiety disorder: Secondary | ICD-10-CM

## 2022-05-29 NOTE — Progress Notes (Signed)
[facility] Counselor/Therapist Progress Note  Patient ID: Laurie Simmons, MRN: [id number],    Date: 05/29/2022  Time Spent: 50 minutes   Treatment Type: Individual Therapy  Reported Symptoms: anxiety, some depression, frustrated   Mental Status Exam:  Appearance:   Casual     Behavior:  Appropriate, Sharing, and Motivated  Motor:  Normal  Speech/Language:   Clear and Coherent  Affect:  Depressed and anxiety  Mood:  anxious, depressed, and irritable  Thought process:  goal directed  Thought content:    Some obsessive thoughts  Sensory/Perceptual disturbances:    WNL  Orientation:  oriented to person, place, time/date, situation, day of week, month of year, year, and stated date of Nov. 15, 2023  Attention:  Good  Concentration:  Good and Fair  Memory:  "Some short term memory issues sometimes"  Fund of knowledge:   Good  Insight:    Good and Fair  Judgment:   Good  Impulse Control:  Good   Risk Assessment: Danger to Self:  No Self-injurious Behavior: No Danger to Others: No Duty to Warn:no Physical Aggression / Violence:No  Access to Firearms a concern: No  Gang Involvement:No   Subjective:  Patient in today and reports symptoms of anxiety, "some depression and frustration especially trying to lose weight and has used some meds per her doctor."  Most anxiety and depression related to getting her weight back under control, mad at herself for gaining some weight back, and needed to focus on this in session today as it is clearly very upsetting for patient. States her anxiety and frustration are closely linked with her weight challenges. Working to interrupt negative thoughts so that doesn't lead her to seek comfort in food. Some issues related to aging mother. (Not all details included in this note due to patient privacy needs.") Health anxiety has decreased some. Still has tendency to always assume the worst and encouraged her more today in efforts to decrease her "going to  the negative, assuming most things will no badly". Very set in certain thoughts/feelings and hard to consider other ways of looking at things.Still wanting to read her Bible more and talked about ways of making that happen for herself. "Sleep pretty good lately." Encouraged her in following through on Dr recommended physical activity as she is able, allowing herself to start small if that would help her get started.   Interventions: Cognitive Behavioral Therapy and Ego-Supportive  Long-term goal: Develop healthy cognitive patterns and beliefs about self and the world that lead to alleviation and help prevent the relapse of depressive symptoms Short-term goal: Replace negative self-defeating self-talk with verbalization of realistic and positive cognitive messages. Strategies: Reinforce positive, reality-based cognitive messages that enhance self-confidence and increase adaptive action.  Diagnosis:   ICD-10-CM   1. Generalized anxiety disorder  F41.1      Plan: Patient today actively participating in session as she focused more on her anxiety, some depression, and frustrations especially in trying to work again on losing some weight per her doctor's advice.  Discussed situations that make this more difficult for herself and also decisions/behaviors that she knows is needed in order for her to be successful.  Did well in talking through "roadblocks" to her getting better in goal-directed behaviors, and tendencies to either not follow-through or put things off.  Looked at some points that would help her and motivation.  Does admit that she can be very "set in her ways" and look at all the  reasons that strategies will not work versus trying strategies to see if that might be helpful for her.  Agrees to take at least some small steps between now and next appointment, that her goal related but definitely achievable steps.  Reviewed her negative behaviors a little more today as she acknowledged how they do  lead her in a direction that is typically opposite of what she has said she wanted and on some occasions what doctor suggests.  Good participation in session, able to be a little more lighthearted at times, and am hoping will follow through and more behavioral change that she will find encouraging.  Still working to elevate her confidence while also decreasing her self-doubt and trying to refrain from automatically doing things in a negative light.  Stress management strategies also discussed with patient. Encouraged patient in her practice of more positive and encouraging behaviors including: Staying in the present and focusing more on what she can change or control, not assuming that things are always going to go in a negative and fearful direction, staying in touch with people who are supportive of her, get outside some each day, stay on her prescribed medication, healthy nutrition and exercise, trying not to assume worst-case scenarios, use more positive self talk, reduce overthinking and overanalyzing, challenging and counteracting her self-doubt, and realizing the strengths she shows working with goal-directed behaviors to move in a direction that supports her improved emotional health.  Goal review and progress/challenges noted with patient.  Next appointment within 3 weeks.  This record has been created using [employer].  Chart creation errors have been sought, but may not always have been located and corrected.  Such creation errors do not reflect on the standard of medical care provided.   Shanon Ace, LCSW

## 2022-05-30 ENCOUNTER — Telehealth: Payer: Self-pay

## 2022-05-30 DIAGNOSIS — Z7182 Exercise counseling: Secondary | ICD-10-CM | POA: Diagnosis not present

## 2022-05-30 DIAGNOSIS — Z6841 Body Mass Index (BMI) 40.0 and over, adult: Secondary | ICD-10-CM | POA: Diagnosis not present

## 2022-05-30 DIAGNOSIS — Z713 Dietary counseling and surveillance: Secondary | ICD-10-CM | POA: Diagnosis not present

## 2022-05-30 NOTE — Telephone Encounter (Signed)
Patient notified

## 2022-05-30 NOTE — Telephone Encounter (Signed)
-----   Message from Derwood Kaplan, MD sent at 05/29/2022  5:53 PM EST ----- Regarding: call Tell her BS was 156 but rest of labs look great, and that's not bad.  Her kidney function is the best I've seen it with a creatinine of 1.07, nearly normal

## 2022-06-03 ENCOUNTER — Other Ambulatory Visit: Payer: Self-pay | Admitting: *Deleted

## 2022-06-03 MED ORDER — AZELASTINE HCL 0.15 % NA SOLN: NASAL | 1 refills | Status: DC

## 2022-06-12 DIAGNOSIS — Z6841 Body Mass Index (BMI) 40.0 and over, adult: Secondary | ICD-10-CM | POA: Diagnosis not present

## 2022-06-12 DIAGNOSIS — Z713 Dietary counseling and surveillance: Secondary | ICD-10-CM | POA: Diagnosis not present

## 2022-06-12 DIAGNOSIS — Z7182 Exercise counseling: Secondary | ICD-10-CM | POA: Diagnosis not present

## 2022-06-19 ENCOUNTER — Ambulatory Visit (INDEPENDENT_AMBULATORY_CARE_PROVIDER_SITE_OTHER): Payer: BC Managed Care – PPO | Admitting: *Deleted

## 2022-06-19 DIAGNOSIS — J309 Allergic rhinitis, unspecified: Secondary | ICD-10-CM

## 2022-06-19 DIAGNOSIS — Z713 Dietary counseling and surveillance: Secondary | ICD-10-CM | POA: Diagnosis not present

## 2022-06-19 DIAGNOSIS — Z6841 Body Mass Index (BMI) 40.0 and over, adult: Secondary | ICD-10-CM | POA: Diagnosis not present

## 2022-06-19 DIAGNOSIS — Z7182 Exercise counseling: Secondary | ICD-10-CM | POA: Diagnosis not present

## 2022-06-24 ENCOUNTER — Ambulatory Visit (INDEPENDENT_AMBULATORY_CARE_PROVIDER_SITE_OTHER): Payer: 59 | Admitting: Psychiatry

## 2022-06-24 DIAGNOSIS — F411 Generalized anxiety disorder: Secondary | ICD-10-CM | POA: Diagnosis not present

## 2022-06-24 NOTE — Progress Notes (Signed)
[facility] Counselor/Therapist Progress Note  Patient ID: Laurie Simmons, MRN: [id number],    Date: 06/24/2022  Time Spent: 55 minutes   Treatment Type: Individual Therapy  Reported Symptoms: anxiety, depression, no SI  Mental Status Exam:  Appearance:   Casual     Behavior:  Appropriate, Sharing, and Motivated  Motor:  Normal  Speech/Language:   Clear and Coherent  Affect:  Depressed and anxious  Mood:  anxious and depressed  Thought process:  goal directed  Thought content:    Rumination and some obsessive health-related thoughts  Sensory/Perceptual disturbances:    WNL  Orientation:  oriented to person, place, time/date, situation, day of week, month of year, year, and stated date of Dec. 11, 2023  Attention:  Good  Concentration:  Good  Memory:  WNL  Fund of knowledge:   Good  Insight:    Good and Fair  Judgment:   Good  Impulse Control:  Good   Risk Assessment: Danger to Self:  No Self-injurious Behavior: No Danger to Others: No Duty to Warn:no Physical Aggression / Violence:No  Access to Firearms a concern: No  Gang Involvement:No   Subjective: Patient today report symptoms of anxiety, stressed, depression, and worrying at a "moderate" level. 34 yr old son may need foot surgery but worries a lot and not really wanting to have the surgery. Has lost 4 lbs so not as mad at myself. Frustrated at times and makes "the mistake of eating doughnuts" and get onto myself. Worked on better self-talk in session today as the negative self-talk works against her efforts to lose weight. Issues with mom "pretty good and has her routine, but does repeat herself often with me." Working today on trying to "not assume the worst case scenarios and assuming most things will likely go badly." Health anxiety now and projecting into the future. Worked more on her self-talk and trying to focus less on "what might go wrong" to where that tend to feed her anxiety. Sleep not quite as good  recently, waking up middle of night and sometimes hard to get back to sleep. Processed thoughts about "not worrying as much" and to focus more on this between sessions trying to work more specifically with her anxious thoughts "as being just thoughts" and may not necessarily play out as she imagines/fears. "Still thinking about reading my Bible but haven't started yet."   Interventions: Cognitive Behavioral Therapy, Solution-Oriented/Positive Psychology, and Ego-Supportive  Long-term goal: Develop healthy cognitive patterns and beliefs about self and the world that lead to alleviation and help prevent the relapse of depressive symptoms Short-term goal: Replace negative self-defeating self-talk with verbalization of realistic and positive cognitive messages. Strategies: Reinforce positive, reality-based cognitive messages that enhance self-confidence and increase adaptive action.  Diagnosis:   ICD-10-CM   1. Generalized anxiety disorder  F41.1      Plan: Patient today working well in session with goal-directed behaviors re: her anxiety, over-worrying and depression.  Reviewed some gains that she has already made and look to head on how she can build upon them to decrease her depression, anxiety, and interrupt her tendency to worry and replace those thoughts with more self calming and reassuring thoughts.  Does recognize some of her own progress.  Shared frustrations with trying to eat healthier and discussed several strategies today that might help, including asking her family not to bring things that are unhealthy around her nor encouraged her to eat them with her since she is trying eat  healthier.  Discussed a couple of strategies to help worry less and plans to focus on this between sessions especially with anxious thoughts.  Still trying to take at least "small steps". Encouraged patient and practicing more positive behaviors as noted in session including: Remaining in the present and focusing  more on what she can control or change, not assuming that things are always going to go in a negative and fearful direction, staying in touch with people who are supportive of her, get outside some each day, remain on her prescribed medication, healthy nutrition and exercise, trying not to assume worst-case scenarios, use more positive self talk, reduce overthinking and over analyzing, challenging counteract her self-doubt, and recognize the strength she shows working with goal-directed behaviors to move in a direction that supports her improved emotional health and overall wellbeing.  Goal review and progress/challenges noted with patient.  Next appointment within 3 to 4 weeks.  This record has been created using [employer].  Chart creation errors have been sought, but may not always have been located and corrected.  Such creation errors do not reflect on the standard of medical care provided.   Shanon Ace, LCSW

## 2022-06-25 ENCOUNTER — Telehealth: Payer: Self-pay | Admitting: Cardiology

## 2022-06-25 MED ORDER — ROSUVASTATIN CALCIUM 20 MG PO TABS: 20.0000 mg | ORAL_TABLET | Freq: Every day | ORAL | 3 refills | Status: DC

## 2022-06-25 NOTE — Telephone Encounter (Signed)
RX resent

## 2022-06-25 NOTE — Telephone Encounter (Signed)
 *  STAT* If patient is at the pharmacy, call can be transferred to refill team.   1. Which medications need to be refilled? (please list name of each medication and dose if known)   rosuvastatin (CRESTOR) 20 MG tablet    2. Which pharmacy/location (including street and city if local pharmacy) is medication to be sent to? [id number] - [place], [place] - [address] AT [address]    3. Do they need a 30 day or 90 day supply? 90 days  Per pt pharmacy did not received this prescription. Please resend

## 2022-06-26 ENCOUNTER — Telehealth: Payer: Self-pay

## 2022-06-26 DIAGNOSIS — E785 Hyperlipidemia, unspecified: Secondary | ICD-10-CM | POA: Diagnosis not present

## 2022-06-26 DIAGNOSIS — Z6841 Body Mass Index (BMI) 40.0 and over, adult: Secondary | ICD-10-CM | POA: Diagnosis not present

## 2022-06-26 DIAGNOSIS — Z7182 Exercise counseling: Secondary | ICD-10-CM | POA: Diagnosis not present

## 2022-06-26 DIAGNOSIS — Z713 Dietary counseling and surveillance: Secondary | ICD-10-CM | POA: Diagnosis not present

## 2022-06-26 NOTE — Telephone Encounter (Signed)
Lipid, AST, ALT entered for follow up on increasing Crestor

## 2022-06-27 ENCOUNTER — Ambulatory Visit (INDEPENDENT_AMBULATORY_CARE_PROVIDER_SITE_OTHER): Payer: BC Managed Care – PPO | Admitting: Allergy and Immunology

## 2022-06-27 ENCOUNTER — Encounter: Payer: Self-pay | Admitting: Allergy and Immunology

## 2022-06-27 VITALS — BP 136/82 | HR 91 | Resp 16

## 2022-06-27 DIAGNOSIS — J4540 Moderate persistent asthma, uncomplicated: Secondary | ICD-10-CM | POA: Diagnosis not present

## 2022-06-27 DIAGNOSIS — K219 Gastro-esophageal reflux disease without esophagitis: Secondary | ICD-10-CM

## 2022-06-27 DIAGNOSIS — J0140 Acute pansinusitis, unspecified: Secondary | ICD-10-CM

## 2022-06-27 DIAGNOSIS — J3089 Other allergic rhinitis: Secondary | ICD-10-CM

## 2022-06-27 LAB — LIPID PANEL
Chol/HDL Ratio: 3.5 ratio (ref 0.0–4.4)
Cholesterol, Total: 130 mg/dL (ref 100–199)
HDL: 37 mg/dL — ABNORMAL LOW (ref >39)
LDL Chol Calc (NIH): 57 mg/dL (ref 0–99)
Triglycerides: 224 mg/dL — ABNORMAL HIGH (ref 0–149)
VLDL Cholesterol Cal: 36 mg/dL (ref 5–40)

## 2022-06-27 LAB — AST: AST: 17 IU/L (ref 0–40)

## 2022-06-27 LAB — ALT: ALT: 20 IU/L (ref 0–32)

## 2022-06-27 MED ORDER — AMOXICILLIN-POT CLAVULANATE 875-125 MG PO TABS: 1.0000 | ORAL_TABLET | Freq: Two times a day (BID) | ORAL | 0 refills | Status: DC

## 2022-06-27 MED ORDER — AMOXICILLIN-POT CLAVULANATE 875-125 MG PO TABS: 1.0000 | ORAL_TABLET | Freq: Two times a day (BID) | ORAL | 0 refills | Status: AC

## 2022-06-27 MED ORDER — AZELASTINE HCL 0.15 % NA SOLN: NASAL | 1 refills | Status: DC

## 2022-06-27 NOTE — Progress Notes (Signed)
[facility] [place] [facility] [place]   Follow-up Note  Referring Provider: Marco Collie, MD Primary Provider: Marco Collie, MD Date of Office Visit: 06/27/2022  Subjective:   Laurie Simmons (DOB: 1966-04-21) is a 56 y.o. female who returns to the [facility] [place] [facility] on 06/27/2022 in re-evaluation of the following:  HPI: Laurie Simmons returns to this clinic in reevaluation of asthma, allergic rhinoconjunctivitis, reflux.  I last saw her in this clinic 18 February 2022.  Her asthma has been under excellent control since her last visit and she has not required a systemic steroid to treat an exacerbation and she rarely uses a short acting bronchodilator while she continues on Advair.  Her upper airway was doing quite well while using a nasal steroid and immunotherapy.  However, about 2 weeks ago she developed nasal swelling and her face hurts and her teeth hurt and she has thick yellow nasal discharge that is not clearing over these past 2 weeks.  Her reflux is under good control at this point in time.  She informs me that she had COVID in October 2023 that was relatively mild but apparently left her with palpitations for which she has seen cardiology and had a cardiac catheterization which identified a 25% lesion.  She has a obtained this years flu vaccine.  Allergies as of 06/27/2022       Reactions   Effexor [venlafaxine] Palpitations   Nsaids Other (See Comments)   Pt reports kidney disease        Medication List    albuterol 108 (90 Base) MCG/ACT inhaler Commonly known as: VENTOLIN HFA Inhale 2 puffs into the lungs every 4 (four) hours as needed for wheezing or shortness of breath.   anastrozole 1 MG tablet Commonly known as: ARIMIDEX TAKE 1 TABLET(1 MG) BY MOUTH DAILY What changed: See the new instructions.   ARIPiprazole 2 MG tablet Commonly known as: Abilify Take 1 tablet (2 mg total) by mouth daily.   aspirin EC 81 MG tablet Take 1 tablet (81 mg  total) by mouth daily. Swallow whole.   Azelastine HCl 0.15 % Soln Can use one spray in each nostril two times daily if needed.   B-12 5000 MCG Caps Take 5,000 mcg by mouth daily.   Biotin 10000 MCG Tbdp Take 10,000 mcg by mouth daily.   calcium carbonate 750 MG chewable tablet Commonly known as: TUMS EX Chew 1,500 mg by mouth daily as needed for heartburn.   clorazepate 3.75 MG tablet Commonly known as: TRANXENE Take one tablet daily as needed for anxiety/panic attacks.   EPINEPHrine 0.3 mg/0.3 mL Soaj injection Commonly known as: EpiPen 2-Pak Use as directed for life-threatening allergic reaction.   famotidine 40 MG tablet Commonly known as: PEPCID TAKE 1 TABLET BY MOUTH TWICE DAILY AS DIRECTED   fluticasone 50 MCG/ACT nasal spray Commonly known as: FLONASE Place 2 sprays into both nostrils daily as needed for allergies or rhinitis.   fluticasone-salmeterol 250-50 MCG/ACT Aepb Commonly known as: Advair Diskus Inhale 1 puff into the lungs in the morning and at bedtime.   folic acid 1 MG tablet Commonly known as: FOLVITE TAKE 1 TABLET BY MOUTH DAILY   gabapentin 300 MG capsule Commonly known as: NEURONTIN Take 1 capsule (300 mg total) by mouth 2 (two) times daily. What changed: when to take this   loratadine 10 MG tablet Commonly known as: CLARITIN Can take one tablet by mouth once daily if needed.   ondansetron 4  MG tablet Commonly known as: ZOFRAN TAKE 1 TABLET BY MOUTH EVERY 4 HOURS AS NEEDED FOR NAUSEA What changed: See the new instructions.   Ozempic (2 MG/DOSE) 8 MG/3ML Sopn Generic drug: Semaglutide (2 MG/DOSE) Inject 2 mg into the skin every Sunday.   potassium chloride 10 MEQ tablet Commonly known as: KLOR-CON TAKE 1 TABLET BY MOUTH TWICE DAILY What changed: when to take this   prochlorperazine 10 MG tablet Commonly known as: COMPAZINE TAKE 1 TABLET BY MOUTH EVERY 6 HOURS AS NEEDED FOR NAUSEA What changed:  reasons to take this additional  instructions   rosuvastatin 20 MG tablet Commonly known as: CRESTOR Take 1 tablet (20 mg total) by mouth daily.   senna-docusate 8.6-50 MG tablet Commonly known as: Senokot-S Take 2 tablets by mouth daily.   topiramate 50 MG tablet Commonly known as: TOPAMAX Take 1 tablet (50 mg total) by mouth daily.   UNABLE TO FIND Inject 1 each as directed See admin instructions. Allergy injections for cats, grass, and dust.   valsartan-hydrochlorothiazide 320-25 MG tablet Commonly known as: DIOVAN-HCT Take 1 tablet by mouth daily.   Vilazodone HCl 40 MG Tabs Commonly known as: VIIBRYD Take 1 tablet (40 mg total) by mouth daily. TAKE 1 TABLET(40 MG) BY MOUTH DAILY     Past Medical History:  Diagnosis Date   Anxiety    Arthritis    Asthma    Breast cancer ([place]) 2021   Carpal tunnel syndrome    Carpal tunnel syndrome    bilateral   Chronic kidney disease    stage 3   Depression    Diabetes (HCC)    GERD (gastroesophageal reflux disease)    Hypertension    Irritable bowel syndrome    Obesity    Severe anemia 12/14/2015    Past Surgical History:  Procedure Laterality Date   caesarean     CESAREAN SECTION     CHOLECYSTECTOMY     DILATION AND CURETTAGE OF UTERUS     HIP SURGERY Left    LEFT HEART CATH AND CORONARY ANGIOGRAPHY N/A 02/13/2022   Procedure: LEFT HEART CATH AND CORONARY ANGIOGRAPHY;  Surgeon: Laurie Booze, MD;  Location: [facility];  Service: Cardiovascular;  Laterality: N/A;   MASTECTOMY     PORT A CATH INJECTION ([place] [facility])  2021    Review of systems negative except as noted in HPI / PMHx or noted below:  Review of Systems  Constitutional: Negative.   HENT: Negative.    Eyes: Negative.   Respiratory: Negative.    Cardiovascular: Negative.   Gastrointestinal: Negative.   Genitourinary: Negative.   Musculoskeletal: Negative.   Skin: Negative.   Neurological: Negative.   Endo/Heme/Allergies: Negative.   Psychiatric/Behavioral: Negative.        Objective:   Vitals:   06/27/22 0841  BP: 136/82  Pulse: 91  Resp: 16  SpO2: 99%          Physical Exam Constitutional:      Appearance: She is not diaphoretic.  HENT:     Head: Normocephalic.     Right Ear: Tympanic membrane, ear canal and external ear normal.     Left Ear: Tympanic membrane, ear canal and external ear normal.     Nose: Nose normal. No mucosal edema or rhinorrhea.     Mouth/Throat:     Pharynx: Uvula midline. No oropharyngeal exudate.  Eyes:     Conjunctiva/sclera: Conjunctivae normal.  Neck:     Thyroid: No thyromegaly.  Trachea: Trachea normal. No tracheal tenderness or tracheal deviation.  Cardiovascular:     Rate and Rhythm: Normal rate and regular rhythm.     Heart sounds: Normal heart sounds, S1 normal and S2 normal. No murmur heard. Pulmonary:     Effort: No respiratory distress.     Breath sounds: Normal breath sounds. No stridor. No wheezing or rales.  Lymphadenopathy:     Head:     Right side of head: No tonsillar adenopathy.     Left side of head: No tonsillar adenopathy.     Cervical: No cervical adenopathy.  Skin:    Findings: No erythema or rash.     Nails: There is no clubbing.  Neurological:     Mental Status: She is alert.     Diagnostics:    Spirometry was performed and demonstrated an FEV1 of 2.63 at 99 % of predicted.   Assessment and Plan:   1. Asthma, moderate persistent, well-controlled   2. Other allergic rhinitis   3. Acute non-recurrent pansinusitis   4. LPRD (laryngopharyngeal reflux disease)      1. Continue Advair 250 - 1 inhalations 1-2 times per day    2. Continue Flonase - 1 spray each nostril 3-7 times per week  3. Continue immunotherapy and EpiPen  4. Continue famotidine 40 mg -1 tablet 2 times per day  5. Treat recent sinus infection:   A.  Augmentin 875-1 tablet twice a day for 10 days  B.  Nasal saline multiple times a day  6. If needed:   A. Ventolin  B. OTC antihistamime  C.  Nasal saline  D. Azelastine 1 spray each nostril 2 times per day  7. Return to clinic in 6 months or earlier if problem   Overall Eudora has done very well with her multiorgan atopic disease while using anti-inflammatory agents for airway and immunotherapy and her reflux is under good control at this point while using famotidine twice a day.  She does appear to have a sinus infection that is not clearing over the course of the past 2 weeks and we will give her Augmentin.  Assuming she does well with this plan I will see her back in this clinic in 6 months or earlier if there is a problem.   Allena Katz, MD [facility]

## 2022-06-27 NOTE — Patient Instructions (Addendum)
  1. Continue Advair 250 - 1 inhalations 1-2 times per day    2. Continue Flonase - 1 spray each nostril 3-7 times per week  3. Continue immunotherapy and EpiPen  4. Continue famotidine 40 mg -1 tablet 2 times per day  5. Treat recent sinus infection:   A.  Augmentin 875-1 tablet twice a day for 10 days  B.  Nasal saline multiple times a day  6. If needed:   A. Ventolin  B. OTC antihistamime  C. Nasal saline  D. Azelastine 1 spray each nostril 2 times per day  7. Return to clinic in 6 months or earlier if problem

## 2022-07-01 ENCOUNTER — Encounter: Payer: Self-pay | Admitting: Allergy and Immunology

## 2022-07-04 ENCOUNTER — Telehealth: Payer: Self-pay

## 2022-07-04 NOTE — Telephone Encounter (Signed)
Results reviewed with pt as per Dr. Krasowski's note.  Pt verbalized understanding and had no additional questions. Routed to PCP  

## 2022-07-05 ENCOUNTER — Other Ambulatory Visit: Payer: Self-pay | Admitting: Oncology

## 2022-07-17 ENCOUNTER — Ambulatory Visit (INDEPENDENT_AMBULATORY_CARE_PROVIDER_SITE_OTHER): Payer: BC Managed Care – PPO | Admitting: *Deleted

## 2022-07-17 DIAGNOSIS — J309 Allergic rhinitis, unspecified: Secondary | ICD-10-CM | POA: Diagnosis not present

## 2022-07-18 DIAGNOSIS — J3089 Other allergic rhinitis: Secondary | ICD-10-CM | POA: Diagnosis not present

## 2022-07-18 NOTE — Progress Notes (Signed)
VIALS EXP 07-19-23

## 2022-07-22 ENCOUNTER — Ambulatory Visit (INDEPENDENT_AMBULATORY_CARE_PROVIDER_SITE_OTHER): Payer: BC Managed Care – PPO | Admitting: Psychiatry

## 2022-07-22 DIAGNOSIS — F331 Major depressive disorder, recurrent, moderate: Secondary | ICD-10-CM

## 2022-07-22 NOTE — Progress Notes (Signed)
[facility] Counselor/Therapist Progress Note  Patient ID: Laurie Simmons, MRN: [id number],    Date: 07/22/2022  Time Spent: 50 minutes   Treatment Type: Individual Therapy  Reported Symptoms: depression, anxiety, "holiday" depression, less motivated  Mental Status Exam:  Appearance:   Casual     Behavior:  Appropriate and Sharing  Motor:  Normal  Speech/Language:   Clear and Coherent  Affect:  Depressed and anxious  Mood:  anxious and depressed  Thought process:  goal directed  Thought content:    Rumination  Sensory/Perceptual disturbances:    WNL  Orientation:  oriented to person, place, time/date, situation, day of week, month of year, year, and stated date of Jan. 8, 2024  Attention:  Good  Concentration:  Good and Fair  Memory:  Some memory issues with short term memory  Fund of knowledge:   Good and Fair  Insight:    Good and Fair  Judgment:   Good  Impulse Control:  Good and Fair   Risk Assessment: Danger to Self:  No Self-injurious Behavior: No Danger to Others: No Duty to Warn:no Physical Aggression / Violence:No  Access to Firearms a concern: No  Gang Involvement:No   Subjective: Patient today "not quite as worried" over prior medical issues. "But still have anxiety moments" but do not last very long, and usually in the evening and will tend to go away if I just go to bed. Son's foot surgery pending.Difficulty making healthier eating choices. Continued work on better self-talk versus her negative self-talk. Considering checking back in on weight-loss classes through her doctor's office. Reports trying to eat healthier but not as consistent with it. Struggling to not always look for the negative nor assume worst case scenarios, "but am trying to confront this and be more positive." Having some improved motivation and focusing on some things she wants to achieve, and doing "this in steps" of getting things together for example some scrappbooking she is working on and  is intentionally focusing on this more. Trying to focus less on "what might go wrong".  Wanting something family can do together and is considering a Bible study appropriate for whole family. Reviewed more today about her negative/anxious thoughts and realizing "they are just thoughts and are not necessarily going to play out as she fears.  Was able to talk to some of her holiday related depression today and feels like she is moving beyond it.  Interventions: Cognitive Behavioral Therapy and Ego-Supportive  Long-term goal: Develop healthy cognitive patterns and beliefs about self and the world that lead to alleviation and help prevent the relapse of depressive symptoms Short-term goal: Replace negative self-defeating self-talk with verbalization of realistic and positive cognitive messages. Strategies: Reinforce positive, reality-based cognitive messages that enhance self-confidence and increase adaptive action.  Diagnosis:   ICD-10-CM   1. Major depressive disorder, recurrent episode, moderate ([place])  F33.1      Plan: Patient today with some decrease in her worrying over prior medical situation.  As noted above had some prior holiday related anxiety/depression and feels that is better.  Plans to check back in on weight loss classes through her doctors office.  Progressing very gradually and needing continued work with goal-directed behaviors including working with patient to become more motivated and achieving goals that could really improve the quality of her life particularly her level of satisfaction, her feeling better about herself, her ability to do some tasks that she is physically capable of doing and wants to  do, and becoming more supportive of herself versus seeing the negatives outweighing the positives.  Encouraged improved self talk and getting outside of the house with whom she is comfortable.  Wanting to make better choices and eating and discussed how she might do this as well as  follow through on a class as mentioned above at her doctor's office. Encouraged patient in her practice of positive behaviors as noted in session including: Not assuming that things are always going to go in a negative and fearful direction, staying in touch with people who are supportive of her, staying in the present and focusing on what she can control or change, get outside some each day, remain on her prescribed medication, healthy nutrition and exercise, trying not to assume worst-case scenarios, use more positive self talk, reduce overthinking and over analyzing, challenge and counteract self-doubt, and realize the strength she shows when working with goal-directed behaviors to move in a direction that supports her improved emotional health and outlook.  Goal review and progress/challenges noted with patient.  Next appointment within 3 weeks.  This record has been created using [employer].  Chart creation errors have been sought, but may not always have been located and corrected.  Such creation errors do not reflect on the standard of medical care provided.   Shanon Ace, LCSW

## 2022-08-08 DIAGNOSIS — E785 Hyperlipidemia, unspecified: Secondary | ICD-10-CM | POA: Diagnosis not present

## 2022-08-08 DIAGNOSIS — E1169 Type 2 diabetes mellitus with other specified complication: Secondary | ICD-10-CM | POA: Diagnosis not present

## 2022-08-08 DIAGNOSIS — I10 Essential (primary) hypertension: Secondary | ICD-10-CM | POA: Diagnosis not present

## 2022-08-14 ENCOUNTER — Ambulatory Visit (INDEPENDENT_AMBULATORY_CARE_PROVIDER_SITE_OTHER): Payer: BC Managed Care – PPO | Admitting: *Deleted

## 2022-08-14 DIAGNOSIS — J309 Allergic rhinitis, unspecified: Secondary | ICD-10-CM

## 2022-08-19 DIAGNOSIS — I10 Essential (primary) hypertension: Secondary | ICD-10-CM | POA: Diagnosis not present

## 2022-08-19 DIAGNOSIS — C779 Secondary and unspecified malignant neoplasm of lymph node, unspecified: Secondary | ICD-10-CM | POA: Diagnosis not present

## 2022-08-19 DIAGNOSIS — Z6841 Body Mass Index (BMI) 40.0 and over, adult: Secondary | ICD-10-CM | POA: Diagnosis not present

## 2022-08-19 DIAGNOSIS — E1169 Type 2 diabetes mellitus with other specified complication: Secondary | ICD-10-CM | POA: Diagnosis not present

## 2022-08-19 DIAGNOSIS — E785 Hyperlipidemia, unspecified: Secondary | ICD-10-CM | POA: Diagnosis not present

## 2022-08-23 ENCOUNTER — Encounter: Payer: Self-pay | Admitting: Adult Health

## 2022-08-23 ENCOUNTER — Ambulatory Visit (INDEPENDENT_AMBULATORY_CARE_PROVIDER_SITE_OTHER): Payer: BC Managed Care – PPO | Admitting: Adult Health

## 2022-08-23 DIAGNOSIS — G4700 Insomnia, unspecified: Secondary | ICD-10-CM

## 2022-08-23 DIAGNOSIS — F331 Major depressive disorder, recurrent, moderate: Secondary | ICD-10-CM | POA: Diagnosis not present

## 2022-08-23 DIAGNOSIS — F411 Generalized anxiety disorder: Secondary | ICD-10-CM

## 2022-08-23 DIAGNOSIS — F410 Panic disorder [episodic paroxysmal anxiety] without agoraphobia: Secondary | ICD-10-CM | POA: Diagnosis not present

## 2022-08-23 MED ORDER — CLORAZEPATE DIPOTASSIUM 3.75 MG PO TABS
ORAL_TABLET | ORAL | 2 refills | Status: DC
Start: 2022-08-23 — End: 2022-12-20

## 2022-08-23 NOTE — Progress Notes (Signed)
Laurie Simmons [id number] December 18, 1965 57 y.o.  Subjective:   Patient ID:  Laurie Simmons is a 57 y.o. (DOB [birth year]) female.  Chief Complaint: No chief complaint on file.   HPI Laurie Simmons presents to the office today for follow-up of anxiety, panic attacks, depression, insomnia.  Describes mood today as "ok". Pleasant. Decreased tearfulness. Mood symptoms - reports some anxiety. Decreased depression. Denies irritability. Reports worry, rumination, and  over thinking. Reporting panic attacks - "less severe". Mood is mostly consistent. Denies mood swings. Stating "I feel sort of blah". Feels like current medications are helpful. Working with a Transport planner. Stable interest and motivation. Taking medications as prescribed. Energy levels lower. Not really active - does not have a regular exercise routine with current physical disabilities.  Enjoys some usual interests and activities. Married. Lives with husband and their two children. Mostly staying home. Appetite adequate. Weight gain - 260 pounds. Working with weight loss management - restarting [place]. Sleeps better some nights than others waking up some. Averages 9 hours. Napping during the day - afternoons. Focus and concentration difficulties. Completing some tasks around the house. Currently disabled and receiving SSI benefits. Denies SI or HI.  Denies AH or VH.   Therapist - Laurie Simmons.  Reports post Covid symptoms.   Flowsheet Row Admission (Discharged) from 02/13/2022 in [facility]  C-SSRS RISK CATEGORY No Risk        Review of Systems:  Review of Systems  Musculoskeletal:  Negative for gait problem.  Neurological:  Negative for tremors.  Psychiatric/Behavioral:         Please refer to HPI    Medications: I have reviewed the patient's current medications.  Current Outpatient Medications  Medication Sig Dispense Refill   albuterol (VENTOLIN HFA) 108 (90 Base) MCG/ACT inhaler Inhale 2 puffs  into the lungs every 4 (four) hours as needed for wheezing or shortness of breath. 18 g 1   anastrozole (ARIMIDEX) 1 MG tablet TAKE 1 TABLET(1 MG) BY MOUTH DAILY (Patient taking differently: Take 1 mg by mouth daily.) 90 tablet 3   ARIPiprazole (ABILIFY) 2 MG tablet Take 1 tablet (2 mg total) by mouth daily. 90 tablet 3   aspirin EC 81 MG tablet Take 1 tablet (81 mg total) by mouth daily. Swallow whole. 90 tablet 3   Azelastine HCl 0.15 % SOLN Can use one spray in each nostril two times daily if needed. 90 mL 1   Biotin 10000 MCG TBDP Take 10,000 mcg by mouth daily.     calcium carbonate (TUMS EX) 750 MG chewable tablet Chew 1,500 mg by mouth daily as needed for heartburn.     clorazepate (TRANXENE) 3.75 MG tablet Take one tablet daily as needed for anxiety/panic attacks. 30 tablet 2   Cyanocobalamin (B-12) 5000 MCG CAPS Take 5,000 mcg by mouth daily.     EPINEPHrine (EPIPEN 2-PAK) 0.3 mg/0.3 mL IJ SOAJ injection Use as directed for life-threatening allergic reaction. (Patient taking differently: Inject 0.3 mg into the muscle as needed for anaphylaxis. Use as directed for life-threatening allergic reaction.) 1 each 3   famotidine (PEPCID) 40 MG tablet TAKE 1 TABLET BY MOUTH TWICE DAILY AS DIRECTED 60 tablet 5   fluticasone (FLONASE) 50 MCG/ACT nasal spray Place 2 sprays into both nostrils daily as needed for allergies or rhinitis.     fluticasone-salmeterol (ADVAIR DISKUS) 250-50 MCG/ACT AEPB Inhale 1 puff into the lungs in the morning and at bedtime. 60 each 5  folic acid (FOLVITE) 1 MG tablet TAKE 1 TABLET BY MOUTH DAILY 30 tablet 5   gabapentin (NEURONTIN) 300 MG capsule Take 1 capsule (300 mg total) by mouth 2 (two) times daily. (Patient taking differently: Take 300 mg by mouth at bedtime.) 60 capsule 3   loratadine (CLARITIN) 10 MG tablet Can take one tablet by mouth once daily if needed. (Patient taking differently: Take 10 mg by mouth daily.) 90 tablet 1   ondansetron (ZOFRAN) 4 MG tablet  TAKE 1 TABLET BY MOUTH EVERY 4 HOURS AS NEEDED FOR NAUSEA (Patient taking differently: Take 4 mg by mouth every 4 (four) hours as needed for nausea or vomiting.) 30 tablet 0   OZEMPIC, 2 MG/DOSE, 8 MG/3ML SOPN Inject 2 mg into the skin every Sunday.     potassium chloride (KLOR-CON) 10 MEQ tablet TAKE 1 TABLET BY MOUTH TWICE DAILY (Patient taking differently: Take 10 mEq by mouth daily.) 60 tablet 1   prochlorperazine (COMPAZINE) 10 MG tablet TAKE 1 TABLET BY MOUTH EVERY 6 HOURS AS NEEDED FOR NAUSEA (Patient taking differently: Take 10 mg by mouth every 6 (six) hours as needed for nausea or vomiting.) 90 tablet 1   rosuvastatin (CRESTOR) 20 MG tablet Take 1 tablet (20 mg total) by mouth daily. 90 tablet 3   senna-docusate (SENOKOT-S) 8.6-50 MG tablet Take 2 tablets by mouth daily.     topiramate (TOPAMAX) 50 MG tablet Take 1 tablet (50 mg total) by mouth daily. 90 tablet 3   UNABLE TO FIND Inject 1 each as directed See admin instructions. Allergy injections for cats, grass, and dust.     valsartan-hydrochlorothiazide (DIOVAN-HCT) 320-25 MG tablet Take 1 tablet by mouth daily.  0   Vilazodone HCl (VIIBRYD) 40 MG TABS Take 1 tablet (40 mg total) by mouth daily. TAKE 1 TABLET(40 MG) BY MOUTH DAILY 90 tablet 3   No current facility-administered medications for this visit.    Medication Side Effects: None  Allergies:  Allergies  Allergen Reactions   Effexor [Venlafaxine] Palpitations   Nsaids Other (See Comments)    Pt reports kidney disease     Past Medical History:  Diagnosis Date   Anxiety    Arthritis    Asthma    Breast cancer ([place]) 2021   Carpal tunnel syndrome    Carpal tunnel syndrome    bilateral   Chronic kidney disease    stage 3   Depression    Diabetes (HCC)    GERD (gastroesophageal reflux disease)    Hypertension    Irritable bowel syndrome    Obesity    Severe anemia 12/14/2015    Past Medical History, Surgical history, Social history, and Family history were  reviewed and updated as appropriate.   Please see review of systems for further details on the patient's review from today.   Objective:   Physical Exam:  LMP 01/23/2011   Physical Exam Constitutional:      General: Laurie Simmons is not in acute distress. Musculoskeletal:        General: No deformity.  Neurological:     Mental Status: Laurie Simmons is alert and oriented to person, place, and time.     Coordination: Coordination normal.  Psychiatric:        Attention and Perception: Attention and perception normal. Laurie Simmons does not perceive auditory or visual hallucinations.        Mood and Affect: Mood normal. Mood is not anxious or depressed. Affect is not labile, blunt, angry or inappropriate.  Speech: Speech normal.        Behavior: Behavior normal.        Thought Content: Thought content normal. Thought content is not paranoid or delusional. Thought content does not include homicidal or suicidal ideation. Thought content does not include homicidal or suicidal plan.        Cognition and Memory: Cognition and memory normal.        Judgment: Judgment normal.     Comments: Insight intact     Lab Review:     Component Value Date/Time   NA 141 05/27/2022 1407   NA 137 02/12/2022 1035   K 3.5 05/27/2022 1407   CL 108 05/27/2022 1407   CO2 25 05/27/2022 1407   GLUCOSE 156 (H) 05/27/2022 1407   BUN 18 05/27/2022 1407   BUN 16 02/12/2022 1035   CREATININE 1.07 (H) 05/27/2022 1407   CALCIUM 9.0 05/27/2022 1407   PROT 7.5 05/27/2022 1407   ALBUMIN 3.7 05/27/2022 1407   AST 17 06/26/2022 0835   AST 16 05/27/2022 1407   ALT 20 06/26/2022 0835   ALT 20 05/27/2022 1407   ALKPHOS 82 05/27/2022 1407   BILITOT 0.4 05/27/2022 1407   GFRNONAA >60 05/27/2022 1407   GFRAA  10/20/2008 2105    >60        The eGFR has been calculated using the MDRD equation. This calculation has not been validated in all clinical situations. eGFR's persistently <60 mL/min signify possible Chronic Kidney Disease.        Component Value Date/Time   WBC 10.1 05/27/2022 1407   WBC 12.3 (H) 10/22/2008 0530   RBC 4.85 05/27/2022 1407   HGB 13.7 05/27/2022 1407   HGB 15.0 02/12/2022 1035   HCT 41.4 05/27/2022 1407   HCT 43.9 02/12/2022 1035   PLT 347 05/27/2022 1407   PLT 329 02/12/2022 1035   MCV 85.4 05/27/2022 1407   MCV 85 02/12/2022 1035   MCV 87 11/07/2020 0000   MCH 28.2 05/27/2022 1407   MCHC 33.1 05/27/2022 1407   RDW 13.7 05/27/2022 1407   RDW 13.1 02/12/2022 1035   LYMPHSABS 1.9 05/27/2022 1407   LYMPHSABS 1.6 02/12/2022 1035   MONOABS 0.7 05/27/2022 1407   EOSABS 0.4 05/27/2022 1407   EOSABS 0.2 02/12/2022 1035   BASOSABS 0.1 05/27/2022 1407   BASOSABS 0.1 02/12/2022 1035    No results found for: "POCLITH", "LITHIUM"   No results found for: "PHENYTOIN", "PHENOBARB", "VALPROATE", "CBMZ"   .res Assessment: Plan:    Plan:  1. Viibryd 89m daily 2. Tranxene 3.745monce daily 3. Topamax 50 mg daily 4. Abilify 77m55maily  RTC 4 months  Patient advised to contact office with any questions, adverse effects, or acute worsening in signs and symptoms.  Discussed potential benefits, risk, and side effects of benzodiazepines to include potential risk of tolerance and dependence, as well as possible drowsiness. Advised patient not to drive if experiencing drowsiness and to take lowest possible effective dose to minimize risk of dependence and tolerance.  Discussed potential metabolic side effects associated with atypical antipsychotics, as well as potential risk for movement side effects. Advised pt to contact office if movement side effects occur.    Diagnoses and all orders for this visit:  Major depressive disorder, recurrent episode, moderate (HCC)  Generalized anxiety disorder -     clorazepate (TRANXENE) 3.75 MG tablet; Take one tablet daily as needed for anxiety/panic attacks.  Panic attacks -     clorazepate (TRANXENE) 3.75 MG  tablet; Take one tablet daily as needed  for anxiety/panic attacks.  Insomnia, unspecified type     Please see After Visit Summary for patient specific instructions.  Future Appointments  Date Time Provider [place]  08/27/2022 10:00 AM Shanon Ace, LCSW [facility] None  09/23/2022 10:00 AM Shanon Ace, LCSW [facility] None  09/25/2022  3:00 PM [facility] None  09/25/2022  3:30 PM Derwood Kaplan, MD [facility] None  09/30/2022  8:00 AM Park Liter, MD [facility] None  12/25/2022  8:30 AM Kozlow, Donnamarie Poag, MD [facility] None    No orders of the defined types were placed in this encounter.   -------------------------------

## 2022-08-27 ENCOUNTER — Ambulatory Visit (INDEPENDENT_AMBULATORY_CARE_PROVIDER_SITE_OTHER): Payer: BC Managed Care – PPO | Admitting: Psychiatry

## 2022-08-27 DIAGNOSIS — F411 Generalized anxiety disorder: Secondary | ICD-10-CM

## 2022-08-27 NOTE — Progress Notes (Signed)
[facility] Counselor/Therapist Progress Note  Patient ID: Laurie Simmons, MRN: [id number],    Date: 08/27/2022  Time Spent: 50 minutes   Treatment Type: Individual Therapy  Reported Symptoms: anxiety, motivation some better, "still spending a lot of time on the couch" but am thinking about getting back into my scrapbooking"; am needing to increase my motivation even though it has increased some.  Mental Status Exam:  Appearance:   Casual     Behavior:  Appropriate, Sharing, and some improvement in motivation  Motor:  Normal  Speech/Language:   Clear and Coherent  Affect:  Flat  Mood:  anxious  Thought process:  goal directed  Thought content:    Obsessive thoughts ; rumination  Sensory/Perceptual disturbances:    WNL  Orientation:  oriented to person, place, time/date, situation, day of week, month of year, year, and stated date of Feb. 13, 2024  Attention:  Good  Concentration:  Good and Fair  Memory:  Some occasional short term memory issues  Fund of knowledge:   Good  Insight:    Fair  Judgment:   Good  Impulse Control:  Good   Risk Assessment: Danger to Self:  No Self-injurious Behavior: No Danger to Others: No Duty to Warn:no Physical Aggression / Violence:No  Access to Firearms a concern: No  Gang Involvement:No   Subjective:   Patient in today reporting some improved motivation, anxiety, and still having" difficulty getting herself up and moving about ". "Haven't started her weight loss but is to start tomorrow; program is to be held at her doctor's office. Has arthritis in hip and she is concerned about it. "I always go to the worst case scenario." Starts weigh loss class tomorrow.Starts PT for back and hip tomorrow. Doesn't always know "what triggers my anxiety and sometimes I do". Tries to "stay on top of it more." "Overthinking about son's upcoming outpatient foot surgery and tend to think negatively vs positively." Trying to help patient today with her anxious  thoughts. Still working on "not looking for the negatives and worst case scenarios" and discussed this more today with specific examples as this is a challenge for patient." Focused too frequently on "what might go wrong" and trying to interrupt this habit more frequently. Reviewed some of her recent anxious thoughts and trying to remember "they are just thoughts and not necessarily going to play out in a negative way."  Seems to be more hopeful today and agreed that she is feeling more hopeful.  Interventions: Cognitive Behavioral Therapy and Ego-Supportive  Long-term goal: Develop healthy cognitive patterns and beliefs about self and the world that lead to alleviation and help prevent the relapse of depressive symptoms Short-term goal: Replace negative self-defeating self-talk with verbalization of realistic and positive cognitive messages. Strategies: Reinforce positive, reality-based cognitive messages that enhance self-confidence and increase adaptive action.  Diagnosis:   ICD-10-CM   1. Generalized anxiety disorder  F41.1      Plan:   Patient today showing motivation and participation in session focusing on her anxiety, health issues, and her overthinking and "needing to calm down more". Making some progress very gradually and needs to continue working with goal-directed behaviors to hopefully increase her motivation more and continue her progress. Encouraged her to intentionally get out of house more and continue her focus on healthier choices. Encouraged patient and practicing positive behaviors as discussed in session including: Not assuming that things are always going in a negative direction, staying in touch with people  who are supportive of her, staying in the present and focusing on what she can control or change, get outside some each day, stay on her prescribed medication, healthy nutrition and exercise, trying not to assume worst-case scenarios, use more positive self talk, reduce  overthinking and over analyzing, challenging counteract self-doubt, and recognize the strength she shows when working with goal-directed behaviors to move in a direction that supports her improved emotional health and overall wellbeing.  Goal review and progress/challenges noted with patient.  Next appointment within 4 weeks.  This record has been created using [employer].  Chart creation errors have been sought, but may not always have been located and corrected.  Such creation errors do not reflect on the standard of medical care provided.   Shanon Ace, LCSW

## 2022-08-28 DIAGNOSIS — Z713 Dietary counseling and surveillance: Secondary | ICD-10-CM | POA: Diagnosis not present

## 2022-08-28 DIAGNOSIS — E1169 Type 2 diabetes mellitus with other specified complication: Secondary | ICD-10-CM | POA: Diagnosis not present

## 2022-08-28 DIAGNOSIS — M25552 Pain in left hip: Secondary | ICD-10-CM | POA: Diagnosis not present

## 2022-08-28 DIAGNOSIS — M545 Low back pain, unspecified: Secondary | ICD-10-CM | POA: Diagnosis not present

## 2022-08-28 DIAGNOSIS — Z6841 Body Mass Index (BMI) 40.0 and over, adult: Secondary | ICD-10-CM | POA: Diagnosis not present

## 2022-09-03 DIAGNOSIS — M25652 Stiffness of left hip, not elsewhere classified: Secondary | ICD-10-CM | POA: Diagnosis not present

## 2022-09-03 DIAGNOSIS — M545 Low back pain, unspecified: Secondary | ICD-10-CM | POA: Diagnosis not present

## 2022-09-03 DIAGNOSIS — E1169 Type 2 diabetes mellitus with other specified complication: Secondary | ICD-10-CM | POA: Diagnosis not present

## 2022-09-03 DIAGNOSIS — Z6841 Body Mass Index (BMI) 40.0 and over, adult: Secondary | ICD-10-CM | POA: Diagnosis not present

## 2022-09-03 DIAGNOSIS — Z713 Dietary counseling and surveillance: Secondary | ICD-10-CM | POA: Diagnosis not present

## 2022-09-03 DIAGNOSIS — R531 Weakness: Secondary | ICD-10-CM | POA: Diagnosis not present

## 2022-09-03 DIAGNOSIS — R262 Difficulty in walking, not elsewhere classified: Secondary | ICD-10-CM | POA: Diagnosis not present

## 2022-09-03 DIAGNOSIS — M25552 Pain in left hip: Secondary | ICD-10-CM | POA: Diagnosis not present

## 2022-09-06 DIAGNOSIS — M545 Low back pain, unspecified: Secondary | ICD-10-CM | POA: Diagnosis not present

## 2022-09-06 DIAGNOSIS — M25552 Pain in left hip: Secondary | ICD-10-CM | POA: Diagnosis not present

## 2022-09-06 DIAGNOSIS — R531 Weakness: Secondary | ICD-10-CM | POA: Diagnosis not present

## 2022-09-06 DIAGNOSIS — R262 Difficulty in walking, not elsewhere classified: Secondary | ICD-10-CM | POA: Diagnosis not present

## 2022-09-06 DIAGNOSIS — M25652 Stiffness of left hip, not elsewhere classified: Secondary | ICD-10-CM | POA: Diagnosis not present

## 2022-09-09 ENCOUNTER — Ambulatory Visit (INDEPENDENT_AMBULATORY_CARE_PROVIDER_SITE_OTHER): Payer: BC Managed Care – PPO | Admitting: *Deleted

## 2022-09-09 DIAGNOSIS — M25552 Pain in left hip: Secondary | ICD-10-CM | POA: Diagnosis not present

## 2022-09-09 DIAGNOSIS — J309 Allergic rhinitis, unspecified: Secondary | ICD-10-CM | POA: Diagnosis not present

## 2022-09-09 DIAGNOSIS — M545 Low back pain, unspecified: Secondary | ICD-10-CM | POA: Diagnosis not present

## 2022-09-09 DIAGNOSIS — R531 Weakness: Secondary | ICD-10-CM | POA: Diagnosis not present

## 2022-09-09 DIAGNOSIS — M25652 Stiffness of left hip, not elsewhere classified: Secondary | ICD-10-CM | POA: Diagnosis not present

## 2022-09-09 DIAGNOSIS — R262 Difficulty in walking, not elsewhere classified: Secondary | ICD-10-CM | POA: Diagnosis not present

## 2022-09-10 DIAGNOSIS — Z6841 Body Mass Index (BMI) 40.0 and over, adult: Secondary | ICD-10-CM | POA: Diagnosis not present

## 2022-09-10 DIAGNOSIS — E1169 Type 2 diabetes mellitus with other specified complication: Secondary | ICD-10-CM | POA: Diagnosis not present

## 2022-09-10 DIAGNOSIS — Z713 Dietary counseling and surveillance: Secondary | ICD-10-CM | POA: Diagnosis not present

## 2022-09-11 DIAGNOSIS — R531 Weakness: Secondary | ICD-10-CM | POA: Diagnosis not present

## 2022-09-11 DIAGNOSIS — R262 Difficulty in walking, not elsewhere classified: Secondary | ICD-10-CM | POA: Diagnosis not present

## 2022-09-11 DIAGNOSIS — M25652 Stiffness of left hip, not elsewhere classified: Secondary | ICD-10-CM | POA: Diagnosis not present

## 2022-09-11 DIAGNOSIS — M25552 Pain in left hip: Secondary | ICD-10-CM | POA: Diagnosis not present

## 2022-09-11 DIAGNOSIS — M545 Low back pain, unspecified: Secondary | ICD-10-CM | POA: Diagnosis not present

## 2022-09-17 DIAGNOSIS — M25652 Stiffness of left hip, not elsewhere classified: Secondary | ICD-10-CM | POA: Diagnosis not present

## 2022-09-17 DIAGNOSIS — R531 Weakness: Secondary | ICD-10-CM | POA: Diagnosis not present

## 2022-09-17 DIAGNOSIS — M545 Low back pain, unspecified: Secondary | ICD-10-CM | POA: Diagnosis not present

## 2022-09-17 DIAGNOSIS — M25552 Pain in left hip: Secondary | ICD-10-CM | POA: Diagnosis not present

## 2022-09-17 DIAGNOSIS — R262 Difficulty in walking, not elsewhere classified: Secondary | ICD-10-CM | POA: Diagnosis not present

## 2022-09-18 DIAGNOSIS — Z713 Dietary counseling and surveillance: Secondary | ICD-10-CM | POA: Diagnosis not present

## 2022-09-18 DIAGNOSIS — E1169 Type 2 diabetes mellitus with other specified complication: Secondary | ICD-10-CM | POA: Diagnosis not present

## 2022-09-18 DIAGNOSIS — M25652 Stiffness of left hip, not elsewhere classified: Secondary | ICD-10-CM | POA: Diagnosis not present

## 2022-09-18 DIAGNOSIS — Z6841 Body Mass Index (BMI) 40.0 and over, adult: Secondary | ICD-10-CM | POA: Diagnosis not present

## 2022-09-18 DIAGNOSIS — R262 Difficulty in walking, not elsewhere classified: Secondary | ICD-10-CM | POA: Diagnosis not present

## 2022-09-18 DIAGNOSIS — R531 Weakness: Secondary | ICD-10-CM | POA: Diagnosis not present

## 2022-09-18 DIAGNOSIS — M545 Low back pain, unspecified: Secondary | ICD-10-CM | POA: Diagnosis not present

## 2022-09-18 DIAGNOSIS — M25552 Pain in left hip: Secondary | ICD-10-CM | POA: Diagnosis not present

## 2022-09-23 ENCOUNTER — Ambulatory Visit (INDEPENDENT_AMBULATORY_CARE_PROVIDER_SITE_OTHER): Payer: BC Managed Care – PPO | Admitting: Psychiatry

## 2022-09-23 DIAGNOSIS — F411 Generalized anxiety disorder: Secondary | ICD-10-CM | POA: Diagnosis not present

## 2022-09-23 NOTE — Progress Notes (Signed)
[facility] Counselor/Therapist Progress Note  Patient ID: Laurie Simmons, MRN: [id number],    Date: 09/23/2022  Time Spent: 50 minutes  Treatment Type: Individual Therapy  Reported Symptoms: anxiety  Mental Status Exam:  Appearance:   Casual     Behavior:  Appropriate and Motivated  Motor:  Normal  Speech/Language:   Clear and Coherent  Affect:  anxious  Mood:  anxious and some depression  Thought process:  goal directed  Thought content:    Rumination and obsessive thougths  Sensory/Perceptual disturbances:    WNL  Orientation:  oriented to person, place, time/date, situation, day of week, month of year, year, and stated date of September 23, 2022  Attention:  Fair  Concentration:  Good and Fair  Memory:  "Some short term memory issues" and Dr is aware  Fund of knowledge:   Good  Insight:    Good  Judgment:   Good  Impulse Control:  Good   Risk Assessment: Danger to Self:  No Self-injurious Behavior: No Danger to Others: No Duty to Warn:no Physical Aggression / Violence:No  Access to Firearms a concern: No  Gang Involvement:No   Subjective:  Patient today reporting anxiety mostly related to "all kinds of things including mom's declining mental health, triggers re: previous job and for some reason this time of year triggers me." Triggers result in increased anxiety. Working today in session on her anxiety further by processing more some anxiety-provoking situations with which she is currently involved especially son's upcoming surgery. Realizing she doesn't cope well with uncertainty and likes to plan ahead, which is not totally possible in this situation. Worked in session today on triggers to her anxiety and trying to confront them more actively and follow through on the exercises that have been recommended for her. Acknowledges "I am a stress eater" and trying to interrupt that and stop over-eating when stressed. Feeling bad about herself when she doesn't "stick with her  healthier eating" but doesn't like the food choices of healthy eating per her report. States "I just need to make myself do this" and discussed what goes into her decision-making, which we worked with some today, hoping to improve her commitment to her wanted changes.  Encouraged some work one of her short-term goals which is decrease her negative, self-defeating self talk and replace with more positive self talk.  Discussed a couple of specific examples of this in session today.  Still doing PT for her back hip "but can't really tell if it's helping or not."   Interventions: Cognitive Behavioral Therapy and Ego-Supportive  Long-term goal: Develop healthy cognitive patterns and beliefs about self and the world that lead to alleviation and help prevent the relapse of depressive symptoms Short-term goal: Replace negative self-defeating self-talk with verbalization of realistic and positive cognitive messages. Strategies: Reinforce positive, reality-based cognitive messages that enhance self-confidence and increase adaptive action.  Diagnosis:   ICD-10-CM   1. Generalized anxiety disorder  F41.1      Plan:  Patient in today participating well in session as she worked further on her anxiety which tends to be more related to personal, health issues of her own and within family, difficulty with her weight loss "going too slow and hard to stick to the diet", difficulty and follow-through from therapy on goal-directed behaviors we discussed.  Motivation today is not quite as strong as last session.  Patient is making some progress although not as fast as she would like, and needs to continue  working with goal-directed behaviors and motivational challenges in order to keep moving forward in a positive direction. Encouraged patient in practicing more positive and self-affirming behaviors as noted in session including: Refraining from assuming that things are always going to go in a negative direction, remain  in touch with people who are supportive of her, stay in the present and focused on what she can change her control, get outside some each day, stay on her prescribed medication, healthy nutrition and exercise, trying not to assume worst-case scenarios, use of more positive self talk, challenge and counteract self-doubt, reduce overthinking and over analyzing, and realize the strengths she shows when working with goal-directed behaviors to move in a direction that supports her improved emotional health and outlook.  Goal review and progress/challenges noted with patient.  Next appointment within 3 to 4 weeks.  This record has been created using [employer].  Chart creation errors have been sought, but may not always have been located and corrected.  Such creation errors do not reflect on the standard of medical care provided.   Shanon Ace, LCSW

## 2022-09-24 ENCOUNTER — Other Ambulatory Visit: Payer: Self-pay | Admitting: Allergy and Immunology

## 2022-09-24 DIAGNOSIS — R531 Weakness: Secondary | ICD-10-CM | POA: Diagnosis not present

## 2022-09-24 DIAGNOSIS — Z713 Dietary counseling and surveillance: Secondary | ICD-10-CM | POA: Diagnosis not present

## 2022-09-24 DIAGNOSIS — M25552 Pain in left hip: Secondary | ICD-10-CM | POA: Diagnosis not present

## 2022-09-24 DIAGNOSIS — E1169 Type 2 diabetes mellitus with other specified complication: Secondary | ICD-10-CM | POA: Diagnosis not present

## 2022-09-24 DIAGNOSIS — Z6841 Body Mass Index (BMI) 40.0 and over, adult: Secondary | ICD-10-CM | POA: Diagnosis not present

## 2022-09-24 DIAGNOSIS — R262 Difficulty in walking, not elsewhere classified: Secondary | ICD-10-CM | POA: Diagnosis not present

## 2022-09-24 DIAGNOSIS — M25652 Stiffness of left hip, not elsewhere classified: Secondary | ICD-10-CM | POA: Diagnosis not present

## 2022-09-24 DIAGNOSIS — M545 Low back pain, unspecified: Secondary | ICD-10-CM | POA: Diagnosis not present

## 2022-09-25 ENCOUNTER — Inpatient Hospital Stay: Payer: BC Managed Care – PPO

## 2022-09-25 ENCOUNTER — Inpatient Hospital Stay: Payer: BC Managed Care – PPO | Attending: Oncology | Admitting: Oncology

## 2022-09-25 ENCOUNTER — Other Ambulatory Visit: Payer: Self-pay | Admitting: Oncology

## 2022-09-25 VITALS — BP 146/99 | HR 103 | Temp 99.1°F | Resp 18 | Ht 65.0 in | Wt 265.5 lb

## 2022-09-25 DIAGNOSIS — C773 Secondary and unspecified malignant neoplasm of axilla and upper limb lymph nodes: Secondary | ICD-10-CM | POA: Diagnosis not present

## 2022-09-25 DIAGNOSIS — C50912 Malignant neoplasm of unspecified site of left female breast: Secondary | ICD-10-CM

## 2022-09-25 NOTE — Progress Notes (Signed)
Patient Care Team: Abner Greenspan, MD as PCP - General (Family Medicine) Laurie Bott, MD as Obstetrician (Obstetrics and Gynecology) Alesia Morin., MD as Referring Physician (Surgery) Dellia Beckwith, MD as Consulting Physician (Oncology) Lance Bosch, MD as Consulting Physician (Radiation Oncology)  Clinic Day: 09/25/22   Referring physician: Abner Greenspan, MD  ASSESSMENT & PLAN:  Assessment & Plan: Secondary malignant neoplasm of axillary lymph nodes [facility])  Clinical stage IIB (TXN2M0), grade 3, ductal carcinoma with no breast primary, diagnosed in February 2021.  She received neoadjuvant chemotherapy with docetaxel/doxorubicin/cyclophosphamide completed in July.  She underwent left mastectomy and axillary dissection, with no breast primary found and 9/26 lymph nodes positive for metastasis. She completed adjuvant radiation at the end of November, and was placed on hormonal therapy with tamoxifen at the end of December. Baseline bone density scan was completely normal. She was unable to tolerate tamoxifen and was switched to anastrazole. She is tolerating this better with minimal side effects, mainly muscle and joint pain and fatigue.  Plan  I have recommended that she go back to [facility] to get a better fitting and new bras and prosthesis. I informed her that this can contribute to the pain. She started [facility] 4 weeks ago and has no complaints about it at this time. I instructed her to continue taking Anastrozole until a minimum of 5 years unless she wants to try a alternative such as Letrozole. Her next mammogram will be due in August, 2024, and her surgeon will schedule that, along with his follow up. I will schedule her bone density now. I will see her back in 6 months with CBC and CMP. The patient understands the plans discussed today and is in agreement with them.  She knows to contact our office if she develops concerns prior to her next appointment.    I  provided 10 minutes of face-to-face time during this this encounter and > 50% was spent counseling as documented under my assessment and plan.   Dellia Beckwith, MD  [facility] AT [facility] [address] [place] [address] Dept: [phone] Dept Fax: [phone]   No orders of the defined types were placed in this encounter.     CHIEF COMPLAINT:  CC: A 57 year old female with history of breast cancer here for 4 month evaluation.   Current Treatment:  Anastrazole  INTERVAL HISTORY:  Laurie Simmons is here today for repeat clinical assessment for her history of breast cancer. Patient states that she is ok and complains of back issues, fatigue, and sharp pains in her left mastectomy. She is going to physical therapy for her back pain. She did inform me that her mastectomy bra doesn't fit well and she gets soreness of the left chest wall after wearing her prosthesis. I have recommended that she go back to [facility] to get a better fitting and new bras and prosthesis. I informed her that this can contribute to the pain. She started [facility] 4 weeks ago and has no complaints about it at this time. I informed her to continue taking Anastrozole until a minimum of 5 years unless she wants to try a alternative such as Letrozole. Her next mammogram will be due in August, 2024, and follow up with her surgeon. I will schedule her bone density now. I will see her back in 6 months with CBC and CMP.  She denies signs of infection such as sore throat, sinus drainage, cough,  or urinary symptoms.  She denies fevers or recurrent chills. She denies pain. She denies nausea, vomiting, chest pain, dyspnea or cough. Her appetite is good and her weight has increased 3 pounds over last 4 months .  I have reviewed the past medical history, past surgical history, social history and family history with the patient and they are unchanged from previous  note.  ALLERGIES:  is allergic to effexor [venlafaxine] and nsaids.  MEDICATIONS:  Current Outpatient Medications  Medication Sig Dispense Refill   MOUNJARO 5 MG/0.5ML Pen Inject 5 mg into the skin once a week.     albuterol (VENTOLIN HFA) 108 (90 Base) MCG/ACT inhaler Inhale 2 puffs into the lungs every 4 (four) hours as needed for wheezing or shortness of breath. 18 g 1   anastrozole (ARIMIDEX) 1 MG tablet TAKE 1 TABLET(1 MG) BY MOUTH DAILY 90 tablet 3   ARIPiprazole (ABILIFY) 2 MG tablet Take 1 tablet (2 mg total) by mouth daily. 90 tablet 3   aspirin EC 81 MG tablet Take 1 tablet (81 mg total) by mouth daily. Swallow whole. 90 tablet 3   Azelastine HCl 0.15 % SOLN Can use one spray in each nostril two times daily if needed. (Patient taking differently: Place 1 spray into both nostrils 2 (two) times daily as needed (allergies). Can use one spray in each nostril two times daily if needed.) 90 mL 1   Biotin 85027 MCG TBDP Take 10,000 mcg by mouth daily.     calcium carbonate (TUMS EX) 750 MG chewable tablet Chew 1,500 mg by mouth daily as needed for heartburn.     clorazepate (TRANXENE) 3.75 MG tablet Take one tablet daily as needed for anxiety/panic attacks. (Patient taking differently: Take 3.75 mg by mouth at bedtime as needed for anxiety. Take one tablet daily as needed for anxiety/panic attacks.) 30 tablet 2   Cyanocobalamin (B-12) 5000 MCG CAPS Take 5,000 mcg by mouth daily.     EPINEPHrine (EPIPEN 2-PAK) 0.3 mg/0.3 mL IJ SOAJ injection Use as directed for life-threatening allergic reaction. (Patient taking differently: Inject 0.3 mg into the muscle as needed for anaphylaxis. Use as directed for life-threatening allergic reaction.) 1 each 3   famotidine (PEPCID) 40 MG tablet TAKE 1 TABLET BY MOUTH TWICE DAILY AS DIRECTED 60 tablet 5   fluticasone (FLONASE) 50 MCG/ACT nasal spray Place 2 sprays into both nostrils daily as needed for allergies or rhinitis.     fluticasone-salmeterol (ADVAIR  DISKUS) 250-50 MCG/ACT AEPB Inhale 1 puff into the lungs in the morning and at bedtime. 60 each 5   folic acid (FOLVITE) 1 MG tablet TAKE 1 TABLET BY MOUTH DAILY (Patient taking differently: Take 1 mg by mouth daily.) 30 tablet 5   gabapentin (NEURONTIN) 300 MG capsule Take 1 capsule (300 mg total) by mouth 2 (two) times daily. (Patient taking differently: Take 300 mg by mouth at bedtime.) 60 capsule 3   loratadine (CLARITIN) 10 MG tablet TAKE 1 TABLET BY MOUTH ONCE DAILY AS NEEDED 90 tablet 1   ondansetron (ZOFRAN) 4 MG tablet TAKE 1 TABLET BY MOUTH EVERY 4 HOURS AS NEEDED FOR NAUSEA (Patient taking differently: Take 4 mg by mouth every 4 (four) hours as needed for nausea or vomiting.) 30 tablet 0   potassium chloride (KLOR-CON) 10 MEQ tablet TAKE 1 TABLET BY MOUTH TWICE DAILY (Patient taking differently: Take 10 mEq by mouth daily.) 60 tablet 1   prochlorperazine (COMPAZINE) 10 MG tablet TAKE 1 TABLET BY MOUTH EVERY 6 HOURS  AS NEEDED FOR NAUSEA (Patient taking differently: Take 10 mg by mouth every 6 (six) hours as needed for nausea or vomiting.) 90 tablet 1   rosuvastatin (CRESTOR) 20 MG tablet Take 1 tablet (20 mg total) by mouth daily. 90 tablet 3   senna-docusate (SENOKOT-S) 8.6-50 MG tablet Take 2 tablets by mouth daily.     topiramate (TOPAMAX) 50 MG tablet Take 1 tablet (50 mg total) by mouth daily. 90 tablet 3   UNABLE TO FIND Inject 1 each as directed See admin instructions. Allergy injections for cats, grass, and dust.     valsartan-hydrochlorothiazide (DIOVAN-HCT) 320-25 MG tablet Take 1 tablet by mouth daily.  0   Vilazodone HCl (VIIBRYD) 40 MG TABS Take 1 tablet (40 mg total) by mouth daily. TAKE 1 TABLET(40 MG) BY MOUTH DAILY 90 tablet 3   No current facility-administered medications for this visit.    HISTORY OF PRESENT ILLNESS:   Oncology History  Secondary malignant neoplasm of axillary lymph nodes  08/16/2019 Cancer Staging   Staging form: Breast, AJCC 8th Edition - Clinical  stage from 08/16/2019: Stage IIIA (cT0, cN2(f), cM0, G3, ER+, PR+, HER2-) - Signed by Dellia BeckwithMcCarty, Alvia Jablonski H, MD on 06/28/2020 Staging comments: TAC x 6 as neoadjuvant chemo   08/23/2019 Initial Diagnosis   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)   03/15/2020 Cancer Staging   Staging form: Breast, AJCC 8th Edition - Pathologic stage from 03/15/2020: No Stage Recommended (ypT0, pN2a, cM0, G3, ER+, PR+, HER2-) - Signed by Dellia BeckwithMcCarty, Mikeria Valin H, MD on 06/28/2020 Staging comments: Mastectomy with no primary found, postop radiation, then hormonal Rx     REVIEW OF SYSTEMS:  Review of Systems  Constitutional:  Positive for fatigue. Negative for appetite change, chills, diaphoresis, fever and unexpected weight change.  HENT:  Negative.  Negative for hearing loss, lump/mass, mouth sores, nosebleeds, sore throat, tinnitus, trouble swallowing and voice change.   Eyes: Negative.  Negative for eye problems and icterus.  Respiratory: Negative.  Negative for chest tightness, cough, hemoptysis, shortness of breath and wheezing.   Cardiovascular: Negative.  Negative for chest pain, leg swelling and palpitations.  Gastrointestinal: Negative.  Negative for abdominal distention, abdominal pain, blood in stool, constipation, diarrhea, nausea, rectal pain and vomiting.  Endocrine: Negative.   Genitourinary:  Negative for bladder incontinence, difficulty urinating, dyspareunia, dysuria, frequency, hematuria, menstrual problem, nocturia, pelvic pain, vaginal bleeding and vaginal discharge.   Musculoskeletal:  Positive for back pain. Negative for arthralgias, flank pain, gait problem, myalgias, neck pain and neck stiffness.       Sharp pain in her left mastectomy  Skin: Negative.  Negative for itching, rash and wound.  Neurological:  Negative for dizziness, extremity weakness, gait problem, headaches, light-headedness, numbness, seizures and speech difficulty.  Hematological: Negative.  Negative for  adenopathy. Does not bruise/bleed easily.  Psychiatric/Behavioral: Negative.  Negative for confusion, decreased concentration, depression, sleep disturbance and suicidal ideas. The patient is not nervous/anxious.    VITALS:  Blood pressure (!) 146/99, pulse (!) 103, temperature 99.1 F (37.3 C), temperature source Oral, resp. rate 18, height 5\' 5"  (1.651 m), weight 265 lb 8 oz (120.4 kg), last menstrual period 01/23/2011, SpO2 98 %.  Wt Readings from Last 3 Encounters:  09/30/22 266 lb 9.6 oz (120.9 kg)  09/25/22 265 lb 8 oz (120.4 kg)  05/27/22 262 lb 8 oz (119.1 kg)    Body mass index is 44.18 kg/m.  Performance status (ECOG): 1 - Symptomatic but completely ambulatory  PHYSICAL EXAM:  Physical Exam Vitals and nursing note reviewed.  Constitutional:      General: She is not in acute distress.    Appearance: Normal appearance. She is normal weight. She is not ill-appearing, toxic-appearing or diaphoretic.  HENT:     Head: Normocephalic and atraumatic.     Right Ear: Tympanic membrane, ear canal and external ear normal. There is no impacted cerumen.     Left Ear: Tympanic membrane, ear canal and external ear normal. There is no impacted cerumen.     Nose: Nose normal. No congestion or rhinorrhea.     Mouth/Throat:     Mouth: Mucous membranes are moist.     Pharynx: Oropharynx is clear. No oropharyngeal exudate or posterior oropharyngeal erythema.  Eyes:     General: No scleral icterus.       Right eye: No discharge.        Left eye: No discharge.     Extraocular Movements: Extraocular movements intact.     Conjunctiva/sclera: Conjunctivae normal.     Pupils: Pupils are equal, round, and reactive to light.  Neck:     Vascular: No carotid bruit.  Cardiovascular:     Rate and Rhythm: Normal rate and regular rhythm.     Pulses: Normal pulses.     Heart sounds: Normal heart sounds. No murmur heard.    No friction rub. No gallop.  Pulmonary:     Effort: Pulmonary effort is  normal. No respiratory distress.     Breath sounds: Normal breath sounds. No stridor. No wheezing, rhonchi or rales.  Chest:     Chest wall: No tenderness.     Comments: Left mastectomy is negative.  Right breast are without masses. Abdominal:     General: Bowel sounds are normal. There is no distension.     Palpations: Abdomen is soft. There is no hepatomegaly, splenomegaly or mass.     Tenderness: There is no abdominal tenderness. There is no right CVA tenderness, left CVA tenderness, guarding or rebound.     Hernia: No hernia is present.  Musculoskeletal:        General: No swelling, tenderness, deformity or signs of injury. Normal range of motion.     Cervical back: Normal range of motion and neck supple. No rigidity or tenderness.     Right lower leg: No edema.     Left lower leg: No edema.  Lymphadenopathy:     Cervical: No cervical adenopathy.     Right cervical: No superficial, deep or posterior cervical adenopathy.    Left cervical: No superficial, deep or posterior cervical adenopathy.     Upper Body:     Right upper body: No supraclavicular, axillary or pectoral adenopathy.     Left upper body: No supraclavicular, axillary or pectoral adenopathy.  Skin:    General: Skin is warm and dry.     Coloration: Skin is not jaundiced or pale.     Findings: No bruising, erythema, lesion or rash.  Neurological:     General: No focal deficit present.     Mental Status: She is alert and oriented to person, place, and time. Mental status is at baseline.     Cranial Nerves: No cranial nerve deficit.     Sensory: No sensory deficit.     Motor: No weakness.     Coordination: Coordination normal.     Gait: Gait normal.     Deep Tendon Reflexes: Reflexes normal.  Psychiatric:        Mood  and Affect: Mood normal.        Behavior: Behavior normal.        Thought Content: Thought content normal.        Judgment: Judgment normal.      LABORATORY DATA:  I have reviewed the data as  listed   Component Ref Range & Units 3 mo ago 4 mo ago  Cholesterol, Total 100 - 199 mg/dL 409 811  Triglycerides 0 - 149 mg/dL 914 High  782 High   HDL >39 mg/dL 37 Low  46  VLDL Cholesterol Cal 5 - 40 mg/dL 36 37  LDL Chol Calc (NIH) 0 - 99 mg/dL 57 78  Chol/HDL Ratio 0.0 - 4.4 ratio 3.5 3.5 CM   Component Ref Range & Units 4 mo ago 1 yr ago  TSH 0.350 - 4.500 uIU/mL 2.716 3.070 R     Component Ref Range & Units 4 mo ago 1 yr ago  T4, Total 4.5 - 12.0 ug/dL 4.9 6.1      Component Value Date/Time   NA 141 05/27/2022 1407   NA 137 02/12/2022 1035   K 3.5 05/27/2022 1407   CL 108 05/27/2022 1407   CO2 25 05/27/2022 1407   GLUCOSE 156 (H) 05/27/2022 1407   BUN 18 05/27/2022 1407   BUN 16 02/12/2022 1035   CREATININE 1.07 (H) 05/27/2022 1407   CALCIUM 9.0 05/27/2022 1407   PROT 7.5 05/27/2022 1407   ALBUMIN 3.7 05/27/2022 1407   AST 17 06/26/2022 0835   AST 16 05/27/2022 1407   ALT 20 06/26/2022 0835   ALT 20 05/27/2022 1407   ALKPHOS 82 05/27/2022 1407   BILITOT 0.4 05/27/2022 1407   GFRNONAA >60 05/27/2022 1407   GFRAA  10/20/2008 2105    >60        The eGFR has been calculated using the MDRD equation. This calculation has not been validated in all clinical situations. eGFR's persistently <60 mL/min signify possible Chronic Kidney Disease.    No results found for: "SPEP", "UPEP"  Lab Results  Component Value Date   WBC 10.1 05/27/2022   NEUTROABS 7.1 05/27/2022   HGB 13.7 05/27/2022   HCT 41.4 05/27/2022   MCV 85.4 05/27/2022   PLT 347 05/27/2022      Chemistry      Component Value Date/Time   NA 141 05/27/2022 1407   NA 137 02/12/2022 1035   K 3.5 05/27/2022 1407   CL 108 05/27/2022 1407   CO2 25 05/27/2022 1407   BUN 18 05/27/2022 1407   BUN 16 02/12/2022 1035   CREATININE 1.07 (H) 05/27/2022 1407   GLU 97 01/23/2022 0000      Component Value Date/Time   CALCIUM 9.0 05/27/2022 1407   ALKPHOS 82 05/27/2022 1407   AST 17 06/26/2022 0835    AST 16 05/27/2022 1407   ALT 20 06/26/2022 0835   ALT 20 05/27/2022 1407   BILITOT 0.4 05/27/2022 1407       RADIOGRAPHIC STUDIES: I have personally reviewed the radiological images as listed and agreed with the findings in the report. No results found.   I,Jasmine M Lassiter,acting as a scribe for Dellia Beckwith, MD.,have documented all relevant documentation on the behalf of Dellia Beckwith, MD,as directed by  Dellia Beckwith, MD while in the presence of Dellia Beckwith, MD.

## 2022-09-26 DIAGNOSIS — R531 Weakness: Secondary | ICD-10-CM | POA: Diagnosis not present

## 2022-09-26 DIAGNOSIS — M545 Low back pain, unspecified: Secondary | ICD-10-CM | POA: Diagnosis not present

## 2022-09-26 DIAGNOSIS — M25652 Stiffness of left hip, not elsewhere classified: Secondary | ICD-10-CM | POA: Diagnosis not present

## 2022-09-26 DIAGNOSIS — M25552 Pain in left hip: Secondary | ICD-10-CM | POA: Diagnosis not present

## 2022-09-26 DIAGNOSIS — R262 Difficulty in walking, not elsewhere classified: Secondary | ICD-10-CM | POA: Diagnosis not present

## 2022-09-30 ENCOUNTER — Encounter: Payer: Self-pay | Admitting: Cardiology

## 2022-09-30 ENCOUNTER — Ambulatory Visit: Payer: BC Managed Care – PPO | Attending: Cardiology | Admitting: Cardiology

## 2022-09-30 VITALS — BP 126/98 | HR 83 | Ht 65.0 in | Wt 266.6 lb

## 2022-09-30 DIAGNOSIS — M545 Low back pain, unspecified: Secondary | ICD-10-CM | POA: Diagnosis not present

## 2022-09-30 DIAGNOSIS — I10 Essential (primary) hypertension: Secondary | ICD-10-CM | POA: Diagnosis not present

## 2022-09-30 DIAGNOSIS — M25652 Stiffness of left hip, not elsewhere classified: Secondary | ICD-10-CM | POA: Diagnosis not present

## 2022-09-30 DIAGNOSIS — Z6841 Body Mass Index (BMI) 40.0 and over, adult: Secondary | ICD-10-CM

## 2022-09-30 DIAGNOSIS — I2510 Atherosclerotic heart disease of native coronary artery without angina pectoris: Secondary | ICD-10-CM

## 2022-09-30 DIAGNOSIS — R262 Difficulty in walking, not elsewhere classified: Secondary | ICD-10-CM | POA: Diagnosis not present

## 2022-09-30 DIAGNOSIS — M25552 Pain in left hip: Secondary | ICD-10-CM | POA: Diagnosis not present

## 2022-09-30 DIAGNOSIS — E785 Hyperlipidemia, unspecified: Secondary | ICD-10-CM | POA: Diagnosis not present

## 2022-09-30 NOTE — Progress Notes (Signed)
Cardiology Office Note:    Date:  09/30/2022   ID:  Laurie Simmons, DOB 04/20/1966, MRN [id number]  PCP:  Marco Collie, MD  Cardiologist:  Jenne Campus, MD    Referring MD: Marco Collie, MD   Chief Complaint  Patient presents with   Follow-up  Doing very well  History of Present Illness:    Laurie Simmons is a 57 y.o. female past medical history significant for atypical chest pain, palpitations, essential hypertension, diabetes, dyslipidemia, morbid obesity.  She presented to [place] initially because of atypical chest pain, stress test has been performed which showed ischemia in LAD territory, after that cardiac catheterization showed only 25% stenosis in the right coronary artery.  She comes today to months for follow-up overall doing very well.  She denies have any chest pain tightness squeezing pressure burning chest no palpitations dizziness swelling of lower extremities.  Overall she is very happy where she is.  Past Medical History:  Diagnosis Date   Abnormal stress test: Anterior wall ischemia cardiac cath after that showing 25% RCA no LAD disease 02/12/2022   Acquired absence of left breast 08/18/2020   Allergic rhinitis 03/10/2015   Anxiety    Anxiety and depression 08/30/2021   Arthritis    Asthma    Bilateral carpal tunnel syndrome 08/25/2018   BMI 40.0-44.9, adult ([place]) 08/23/2019   Breast cancer ([place]) 2021   Carpal tunnel syndrome    Carpal tunnel syndrome    bilateral   Chronic kidney disease    stage 3   Depression    Diabetes ([place])    Dyslipidemia 03/27/2022   Essential hypertension 02/12/2022   Fatigue 08/30/2021   Fibroid 05/04/2019   Fibroids, intramural 08/09/2019   GERD (gastroesophageal reflux disease)    Hemodynamic instability 03/10/2015   Hypertension    Idiopathic urticaria 03/10/2015   Iron deficiency anemia due to chronic blood loss 08/09/2019   Irritable bowel syndrome    Malignant neoplasm of female breast ([place]) 04/17/2022   Moderate persistent  asthma 03/10/2015   Obesity    Pain in both hands 07/16/2018   PMB (postmenopausal bleeding) 04/17/2022   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   Severe anemia 12/14/2015   Type 2 diabetes mellitus without complication, without long-term current use of insulin ([place]) 02/12/2022    Past Surgical History:  Procedure Laterality Date   caesarean     CESAREAN SECTION     CHOLECYSTECTOMY     DILATION AND CURETTAGE OF UTERUS     HIP SURGERY Left    LEFT HEART CATH AND CORONARY ANGIOGRAPHY N/A 02/13/2022   Procedure: LEFT HEART CATH AND CORONARY ANGIOGRAPHY;  Surgeon: Jettie Booze, MD;  Location: [facility];  Service: Cardiovascular;  Laterality: N/A;   MASTECTOMY     PORT A CATH INJECTION ([place] [facility])  2021    Current Medications: Current Meds  Medication Sig   albuterol (VENTOLIN HFA) 108 (90 Base) MCG/ACT inhaler Inhale 2 puffs into the lungs every 4 (four) hours as needed for wheezing or shortness of breath.   anastrozole (ARIMIDEX) 1 MG tablet TAKE 1 TABLET(1 MG) BY MOUTH DAILY (Patient taking differently: Take 1 mg by mouth daily.)   ARIPiprazole (ABILIFY) 2 MG tablet Take 1 tablet (2 mg total) by mouth daily.   aspirin EC 81 MG tablet Take 1 tablet (81 mg total) by mouth daily. Swallow whole.   Azelastine HCl 0.15 % SOLN Can use one spray in each nostril two  times daily if needed. (Patient taking differently: Place 1 spray into both nostrils 2 (two) times daily as needed (allergies). Can use one spray in each nostril two times daily if needed.)   Biotin 10000 MCG TBDP Take 10,000 mcg by mouth daily.   calcium carbonate (TUMS EX) 750 MG chewable tablet Chew 1,500 mg by mouth daily as needed for heartburn.   clorazepate (TRANXENE) 3.75 MG tablet Take one tablet daily as needed for anxiety/panic attacks. (Patient taking differently: Take 3.75 mg by mouth at bedtime as needed for anxiety. Take one tablet daily as needed for anxiety/panic  attacks.)   Cyanocobalamin (B-12) 5000 MCG CAPS Take 5,000 mcg by mouth daily.   EPINEPHrine (EPIPEN 2-PAK) 0.3 mg/0.3 mL IJ SOAJ injection Use as directed for life-threatening allergic reaction. (Patient taking differently: Inject 0.3 mg into the muscle as needed for anaphylaxis. Use as directed for life-threatening allergic reaction.)   famotidine (PEPCID) 40 MG tablet TAKE 1 TABLET BY MOUTH TWICE DAILY AS DIRECTED (Patient taking differently: Take 40 mg by mouth daily. TAKE 1 TABLET BY MOUTH TWICE DAILY AS DIRECTED)   fluticasone (FLONASE) 50 MCG/ACT nasal spray Place 2 sprays into both nostrils daily as needed for allergies or rhinitis.   fluticasone-salmeterol (ADVAIR DISKUS) 250-50 MCG/ACT AEPB Inhale 1 puff into the lungs in the morning and at bedtime.   folic acid (FOLVITE) 1 MG tablet TAKE 1 TABLET BY MOUTH DAILY (Patient taking differently: Take 1 mg by mouth daily.)   gabapentin (NEURONTIN) 300 MG capsule Take 1 capsule (300 mg total) by mouth 2 (two) times daily. (Patient taking differently: Take 300 mg by mouth at bedtime.)   loratadine (CLARITIN) 10 MG tablet TAKE 1 TABLET BY MOUTH ONCE DAILY AS NEEDED   MOUNJARO 5 MG/0.5ML Pen Inject 5 mg into the skin once a week.   ondansetron (ZOFRAN) 4 MG tablet TAKE 1 TABLET BY MOUTH EVERY 4 HOURS AS NEEDED FOR NAUSEA (Patient taking differently: Take 4 mg by mouth every 4 (four) hours as needed for nausea or vomiting.)   potassium chloride (KLOR-CON) 10 MEQ tablet TAKE 1 TABLET BY MOUTH TWICE DAILY (Patient taking differently: Take 10 mEq by mouth daily.)   prochlorperazine (COMPAZINE) 10 MG tablet TAKE 1 TABLET BY MOUTH EVERY 6 HOURS AS NEEDED FOR NAUSEA (Patient taking differently: Take 10 mg by mouth every 6 (six) hours as needed for nausea or vomiting.)   rosuvastatin (CRESTOR) 20 MG tablet Take 1 tablet (20 mg total) by mouth daily.   senna-docusate (SENOKOT-S) 8.6-50 MG tablet Take 2 tablets by mouth daily.   topiramate (TOPAMAX) 50 MG  tablet Take 1 tablet (50 mg total) by mouth daily.   UNABLE TO FIND Inject 1 each as directed See admin instructions. Allergy injections for cats, grass, and dust.   valsartan-hydrochlorothiazide (DIOVAN-HCT) 320-25 MG tablet Take 1 tablet by mouth daily.   Vilazodone HCl (VIIBRYD) 40 MG TABS Take 1 tablet (40 mg total) by mouth daily. TAKE 1 TABLET(40 MG) BY MOUTH DAILY   [DISCONTINUED] OZEMPIC, 2 MG/DOSE, 8 MG/3ML SOPN Inject 2 mg into the skin every Sunday.     Allergies:   Effexor [venlafaxine] and Nsaids   Social History   Socioeconomic History   Marital status: Married    Spouse name: Phil   Number of children: 2   Years of education: Not on file   Highest education level: Not on file  Occupational History   Not on file  Tobacco Use   Smoking status: Never  Smokeless tobacco: Never  Vaping Use   Vaping Use: Never used  Substance and Sexual Activity   Alcohol use: No   Drug use: No   Sexual activity: Yes  Other Topics Concern   Not on file  Social History Narrative   Not on file   Social Determinants of Health   Financial Resource Strain: Not on file  Food Insecurity: Not on file  Transportation Needs: Not on file  Physical Activity: Not on file  Stress: Not on file  Social Connections: Not on file     Family History: The patient's family history includes Cancer in her maternal grandmother; Kidney disease in her mother; Lung cancer in her paternal grandfather; Prostate cancer in her maternal grandfather; Pulmonary fibrosis in her father. ROS:   Please see the history of present illness.    All 14 point review of systems negative except as described per history of present illness  EKGs/Labs/Other Studies Reviewed:      Recent Labs: 05/27/2022: BUN 18; Creatinine 1.07; Hemoglobin 13.7; Platelet Count 347; Potassium 3.5; Sodium 141; TSH 2.716 06/26/2022: ALT 20  Recent Lipid Panel    Component Value Date/Time   CHOL 130 06/26/2022 0835   TRIG 224 (H)  06/26/2022 0835   HDL 37 (L) 06/26/2022 0835   CHOLHDL 3.5 06/26/2022 0835   LDLCALC 57 06/26/2022 0835    Physical Exam:    VS:  BP (!) 126/98 (BP Location: Left Arm, Patient Position: Sitting)   Pulse 83   Ht 5\' 5"  (1.651 m)   Wt 266 lb 9.6 oz (120.9 kg)   LMP 01/23/2011   SpO2 97%   BMI 44.36 kg/m     Wt Readings from Last 3 Encounters:  09/30/22 266 lb 9.6 oz (120.9 kg)  09/25/22 265 lb 8 oz (120.4 kg)  05/27/22 262 lb 8 oz (119.1 kg)     GEN:  Well nourished, well developed in no acute distress HEENT: Normal NECK: No JVD; No carotid bruits LYMPHATICS: No lymphadenopathy CARDIAC: RRR, no murmurs, no rubs, no gallops RESPIRATORY:  Clear to auscultation without rales, wheezing or rhonchi  ABDOMEN: Soft, non-tender, non-distended MUSCULOSKELETAL:  No edema; No deformity  SKIN: Warm and dry LOWER EXTREMITIES: no swelling NEUROLOGIC:  Alert and oriented x 3 PSYCHIATRIC:  Normal affect   ASSESSMENT:    1. Coronary artery disease involving native coronary artery of native heart without angina pectoris   2. Essential hypertension   3. BMI 40.0-44.9, adult ([place])   4. Dyslipidemia    PLAN:    In order of problems listed above:  Coronary disease stable from that point review only 25% stenosis of RCA decrease risk factors modifications she does have any signs and symptoms indicating reactivation of the problem.  No chest pain. Essential hypertension blood pressure well-controlled continue present management. Dyslipidemia I did review K PN which show LDL 60 HDL 44 she is taking Crestor 20 which I will continue.  She is also on antiplatelet therapy which I will continue. Morbid obesity she is participating in weight management classes hopefully she will accomplish some progress. Diabetes.  I did review K PN which show me her hemoglobin A1c of 7.0.  She is again trying to lose weight and she is also on appropriate medication excellently managed by primary care  physician   Medication Adjustments/Labs and Tests Ordered: Current medicines are reviewed at length with the patient today.  Concerns regarding medicines are outlined above.  No orders of the defined types were placed  in this encounter.  Medication changes: No orders of the defined types were placed in this encounter.   Signed, Park Liter, MD, [facility] 09/30/2022 8:14 AM    [place]

## 2022-09-30 NOTE — Patient Instructions (Signed)

## 2022-10-03 DIAGNOSIS — M85852 Other specified disorders of bone density and structure, left thigh: Secondary | ICD-10-CM | POA: Diagnosis not present

## 2022-10-03 DIAGNOSIS — Z170 Estrogen receptor positive status [ER+]: Secondary | ICD-10-CM | POA: Diagnosis not present

## 2022-10-03 DIAGNOSIS — M8589 Other specified disorders of bone density and structure, multiple sites: Secondary | ICD-10-CM | POA: Diagnosis not present

## 2022-10-03 DIAGNOSIS — C50912 Malignant neoplasm of unspecified site of left female breast: Secondary | ICD-10-CM | POA: Diagnosis not present

## 2022-10-03 LAB — HM DEXA SCAN

## 2022-10-04 ENCOUNTER — Other Ambulatory Visit: Payer: Self-pay | Admitting: Allergy and Immunology

## 2022-10-07 ENCOUNTER — Ambulatory Visit (INDEPENDENT_AMBULATORY_CARE_PROVIDER_SITE_OTHER): Payer: BC Managed Care – PPO | Admitting: *Deleted

## 2022-10-07 ENCOUNTER — Other Ambulatory Visit: Payer: Self-pay | Admitting: Hematology and Oncology

## 2022-10-07 DIAGNOSIS — J309 Allergic rhinitis, unspecified: Secondary | ICD-10-CM | POA: Diagnosis not present

## 2022-10-07 DIAGNOSIS — R531 Weakness: Secondary | ICD-10-CM | POA: Diagnosis not present

## 2022-10-07 DIAGNOSIS — M25552 Pain in left hip: Secondary | ICD-10-CM | POA: Diagnosis not present

## 2022-10-07 DIAGNOSIS — R262 Difficulty in walking, not elsewhere classified: Secondary | ICD-10-CM | POA: Diagnosis not present

## 2022-10-07 DIAGNOSIS — M545 Low back pain, unspecified: Secondary | ICD-10-CM | POA: Diagnosis not present

## 2022-10-07 DIAGNOSIS — M25652 Stiffness of left hip, not elsewhere classified: Secondary | ICD-10-CM | POA: Diagnosis not present

## 2022-10-07 DIAGNOSIS — C50912 Malignant neoplasm of unspecified site of left female breast: Secondary | ICD-10-CM

## 2022-10-09 DIAGNOSIS — M25652 Stiffness of left hip, not elsewhere classified: Secondary | ICD-10-CM | POA: Diagnosis not present

## 2022-10-09 DIAGNOSIS — M25552 Pain in left hip: Secondary | ICD-10-CM | POA: Diagnosis not present

## 2022-10-09 DIAGNOSIS — Z713 Dietary counseling and surveillance: Secondary | ICD-10-CM | POA: Diagnosis not present

## 2022-10-09 DIAGNOSIS — M545 Low back pain, unspecified: Secondary | ICD-10-CM | POA: Diagnosis not present

## 2022-10-09 DIAGNOSIS — R531 Weakness: Secondary | ICD-10-CM | POA: Diagnosis not present

## 2022-10-09 DIAGNOSIS — E1169 Type 2 diabetes mellitus with other specified complication: Secondary | ICD-10-CM | POA: Diagnosis not present

## 2022-10-09 DIAGNOSIS — R262 Difficulty in walking, not elsewhere classified: Secondary | ICD-10-CM | POA: Diagnosis not present

## 2022-10-09 DIAGNOSIS — Z6841 Body Mass Index (BMI) 40.0 and over, adult: Secondary | ICD-10-CM | POA: Diagnosis not present

## 2022-10-11 ENCOUNTER — Encounter: Payer: Self-pay | Admitting: Oncology

## 2022-10-21 ENCOUNTER — Ambulatory Visit: Payer: BC Managed Care – PPO | Admitting: Psychiatry

## 2022-10-22 ENCOUNTER — Ambulatory Visit (INDEPENDENT_AMBULATORY_CARE_PROVIDER_SITE_OTHER): Payer: BC Managed Care – PPO | Admitting: *Deleted

## 2022-10-22 DIAGNOSIS — J309 Allergic rhinitis, unspecified: Secondary | ICD-10-CM | POA: Diagnosis not present

## 2022-10-28 DIAGNOSIS — M5459 Other low back pain: Secondary | ICD-10-CM | POA: Diagnosis not present

## 2022-10-28 DIAGNOSIS — M25552 Pain in left hip: Secondary | ICD-10-CM | POA: Diagnosis not present

## 2022-10-28 DIAGNOSIS — R531 Weakness: Secondary | ICD-10-CM | POA: Diagnosis not present

## 2022-10-28 DIAGNOSIS — M25652 Stiffness of left hip, not elsewhere classified: Secondary | ICD-10-CM | POA: Diagnosis not present

## 2022-10-28 DIAGNOSIS — R262 Difficulty in walking, not elsewhere classified: Secondary | ICD-10-CM | POA: Diagnosis not present

## 2022-10-30 DIAGNOSIS — M5459 Other low back pain: Secondary | ICD-10-CM | POA: Diagnosis not present

## 2022-10-30 DIAGNOSIS — E1169 Type 2 diabetes mellitus with other specified complication: Secondary | ICD-10-CM | POA: Diagnosis not present

## 2022-10-30 DIAGNOSIS — R262 Difficulty in walking, not elsewhere classified: Secondary | ICD-10-CM | POA: Diagnosis not present

## 2022-10-30 DIAGNOSIS — R531 Weakness: Secondary | ICD-10-CM | POA: Diagnosis not present

## 2022-10-30 DIAGNOSIS — M25552 Pain in left hip: Secondary | ICD-10-CM | POA: Diagnosis not present

## 2022-10-30 DIAGNOSIS — Z713 Dietary counseling and surveillance: Secondary | ICD-10-CM | POA: Diagnosis not present

## 2022-10-30 DIAGNOSIS — Z6841 Body Mass Index (BMI) 40.0 and over, adult: Secondary | ICD-10-CM | POA: Diagnosis not present

## 2022-10-30 DIAGNOSIS — M25652 Stiffness of left hip, not elsewhere classified: Secondary | ICD-10-CM | POA: Diagnosis not present

## 2022-11-05 ENCOUNTER — Ambulatory Visit (INDEPENDENT_AMBULATORY_CARE_PROVIDER_SITE_OTHER): Payer: BC Managed Care – PPO | Admitting: Psychiatry

## 2022-11-05 DIAGNOSIS — F411 Generalized anxiety disorder: Secondary | ICD-10-CM

## 2022-11-05 NOTE — Progress Notes (Signed)
[facility] Counselor/Therapist Progress Note  Patient ID: Laurie Simmons, MRN: [id number],    Date: 11/05/2022  Time Spent: 55 minutes   Treatment Type: Individual Therapy  Reported Symptoms: anxiety  Mental Status Exam:  Appearance:   Casual     Behavior:  Appropriate, Sharing, and Motivated  Motor:  Normal  Speech/Language:   Clear and Coherent  Affect:  anxious  Mood:  anxious  Thought process:  goal directed  Thought content:    Obsessive thoughts  Sensory/Perceptual disturbances:    WNL  Orientation:  oriented to person, place, time/date, situation, day of week, month of year, year, and stated date of November 05, 2022  Attention:  Fair  Concentration:  Good and Fair  Memory:  Some short term memory issues at times  [employer] of knowledge:   Good  Insight:    Good  Judgment:   Good  Impulse Control:  Good   Risk Assessment: Danger to Self:  No Self-injurious Behavior: No Danger to Others: No Duty to Warn:no Physical Aggression / Violence:No  Access to Firearms a concern: No  Gang Involvement:No   Subjective: Patient in today and reporting anxiety as her main symptom and mostly related to son's outpatient foot surgery which went well. Stressed recently in PT and they were using music "which triggered her previous bad memory" but was able to distract herself and managed it ok. "Not sure if it's helping or not". Supposed to be helping her back pain and her hip. Stressors include her son's next outpatient surgery, her mother's declining mental health, and situations that occur unexpectedly, all of which are triggers for patient's anxiety. Working to interrupt negative/worrying thoughts as they are the type thoughts that typically "set me off and worry me a lot." Reports trying to manage her anxiety better without "just popping a pill" and other times "takes her pill and it does help me." Shares that she's being more open with others in sharing some of their family concerns and  that helps her feel better.  Working on decreasing habit of negative self-talk and making some bits of progress thus far, and is committee to keep working on this.  Also trying to not automatically assume the worst in situations, and this is a work in progress.  Encouraged her commitment to keep working on self-defeating self talk and not assuming worst-case scenarios.  Seem to have more motivation today.  Interventions: Cognitive Behavioral Therapy and Ego-Supportive  Long-term goal: Develop healthy cognitive patterns and beliefs about self and the world that lead to alleviation and help prevent the relapse of depressive symptoms Short-term goal: Replace negative self-defeating self-talk with verbalization of realistic and positive cognitive messages. Strategies: Reinforce positive, reality-based cognitive messages that enhance self-confidence and increase adaptive action.  Diagnosis:   ICD-10-CM   1. Generalized anxiety disorder  F41.1      Plan: Patient in today participating well in session working on her anxiety and tendency to assume the worst in situations.  Has follow-through in some of her goal work more often since last session.  Has made progress and needs to continue with goal-directed behaviors in order to keep moving in a forward direction. Encouraged patient and practicing more positive and self affirming behaviors as noted in session including: Refraining from assuming things are going to always go in a negative direction, stay in touch with people who are supportive of her, stay in the present and focused on what she can change or control, get  outside some each day, stay on her prescribed medication, healthy nutrition and exercise, trying not to assume worst-case scenarios, use of more positive self talk, challenge and counteract self-doubt, reduce overthinking and over analyzing, and recognize the strength she shows when working with goal-directed behaviors to move in a direction  that supports her improved emotional health and overall wellbeing.  Goal review and progress/challenges noted with patient.  Next appointment within 4 weeks.  This record has been created using [employer].  Chart creation errors have been sought, but may not always have been located and corrected.  Such creation errors do not reflect on the standard of medical care provided.   Mathis Fare, LCSW

## 2022-11-07 ENCOUNTER — Ambulatory Visit (INDEPENDENT_AMBULATORY_CARE_PROVIDER_SITE_OTHER): Payer: BC Managed Care – PPO | Admitting: *Deleted

## 2022-11-07 DIAGNOSIS — M25652 Stiffness of left hip, not elsewhere classified: Secondary | ICD-10-CM | POA: Diagnosis not present

## 2022-11-07 DIAGNOSIS — M25552 Pain in left hip: Secondary | ICD-10-CM | POA: Diagnosis not present

## 2022-11-07 DIAGNOSIS — J309 Allergic rhinitis, unspecified: Secondary | ICD-10-CM

## 2022-11-07 DIAGNOSIS — R531 Weakness: Secondary | ICD-10-CM | POA: Diagnosis not present

## 2022-11-07 DIAGNOSIS — M5459 Other low back pain: Secondary | ICD-10-CM | POA: Diagnosis not present

## 2022-11-07 DIAGNOSIS — R262 Difficulty in walking, not elsewhere classified: Secondary | ICD-10-CM | POA: Diagnosis not present

## 2022-11-08 ENCOUNTER — Other Ambulatory Visit: Payer: Self-pay | Admitting: Allergy and Immunology

## 2022-11-08 DIAGNOSIS — R531 Weakness: Secondary | ICD-10-CM | POA: Diagnosis not present

## 2022-11-08 DIAGNOSIS — M5459 Other low back pain: Secondary | ICD-10-CM | POA: Diagnosis not present

## 2022-11-08 DIAGNOSIS — R262 Difficulty in walking, not elsewhere classified: Secondary | ICD-10-CM | POA: Diagnosis not present

## 2022-11-08 DIAGNOSIS — M25552 Pain in left hip: Secondary | ICD-10-CM | POA: Diagnosis not present

## 2022-11-08 DIAGNOSIS — M25652 Stiffness of left hip, not elsewhere classified: Secondary | ICD-10-CM | POA: Diagnosis not present

## 2022-11-12 DIAGNOSIS — R531 Weakness: Secondary | ICD-10-CM | POA: Diagnosis not present

## 2022-11-12 DIAGNOSIS — M5459 Other low back pain: Secondary | ICD-10-CM | POA: Diagnosis not present

## 2022-11-12 DIAGNOSIS — N1831 Chronic kidney disease, stage 3a: Secondary | ICD-10-CM | POA: Diagnosis not present

## 2022-11-12 DIAGNOSIS — Z713 Dietary counseling and surveillance: Secondary | ICD-10-CM | POA: Diagnosis not present

## 2022-11-12 DIAGNOSIS — R262 Difficulty in walking, not elsewhere classified: Secondary | ICD-10-CM | POA: Diagnosis not present

## 2022-11-12 DIAGNOSIS — M25652 Stiffness of left hip, not elsewhere classified: Secondary | ICD-10-CM | POA: Diagnosis not present

## 2022-11-12 DIAGNOSIS — E1169 Type 2 diabetes mellitus with other specified complication: Secondary | ICD-10-CM | POA: Diagnosis not present

## 2022-11-12 DIAGNOSIS — Z6841 Body Mass Index (BMI) 40.0 and over, adult: Secondary | ICD-10-CM | POA: Diagnosis not present

## 2022-11-12 DIAGNOSIS — M25552 Pain in left hip: Secondary | ICD-10-CM | POA: Diagnosis not present

## 2022-11-20 DIAGNOSIS — R531 Weakness: Secondary | ICD-10-CM | POA: Diagnosis not present

## 2022-11-20 DIAGNOSIS — M5459 Other low back pain: Secondary | ICD-10-CM | POA: Diagnosis not present

## 2022-11-20 DIAGNOSIS — R262 Difficulty in walking, not elsewhere classified: Secondary | ICD-10-CM | POA: Diagnosis not present

## 2022-11-20 DIAGNOSIS — M25552 Pain in left hip: Secondary | ICD-10-CM | POA: Diagnosis not present

## 2022-11-20 DIAGNOSIS — M25652 Stiffness of left hip, not elsewhere classified: Secondary | ICD-10-CM | POA: Diagnosis not present

## 2022-11-21 ENCOUNTER — Ambulatory Visit: Payer: BC Managed Care – PPO | Admitting: Psychiatry

## 2022-11-22 DIAGNOSIS — M25652 Stiffness of left hip, not elsewhere classified: Secondary | ICD-10-CM | POA: Diagnosis not present

## 2022-11-22 DIAGNOSIS — M5459 Other low back pain: Secondary | ICD-10-CM | POA: Diagnosis not present

## 2022-11-22 DIAGNOSIS — R531 Weakness: Secondary | ICD-10-CM | POA: Diagnosis not present

## 2022-11-22 DIAGNOSIS — R262 Difficulty in walking, not elsewhere classified: Secondary | ICD-10-CM | POA: Diagnosis not present

## 2022-11-22 DIAGNOSIS — M25552 Pain in left hip: Secondary | ICD-10-CM | POA: Diagnosis not present

## 2022-11-26 ENCOUNTER — Ambulatory Visit (INDEPENDENT_AMBULATORY_CARE_PROVIDER_SITE_OTHER): Payer: BC Managed Care – PPO | Admitting: *Deleted

## 2022-11-26 DIAGNOSIS — I10 Essential (primary) hypertension: Secondary | ICD-10-CM | POA: Diagnosis not present

## 2022-11-26 DIAGNOSIS — Z6841 Body Mass Index (BMI) 40.0 and over, adult: Secondary | ICD-10-CM | POA: Diagnosis not present

## 2022-11-26 DIAGNOSIS — J309 Allergic rhinitis, unspecified: Secondary | ICD-10-CM | POA: Diagnosis not present

## 2022-11-26 DIAGNOSIS — E1169 Type 2 diabetes mellitus with other specified complication: Secondary | ICD-10-CM | POA: Diagnosis not present

## 2022-11-26 DIAGNOSIS — E785 Hyperlipidemia, unspecified: Secondary | ICD-10-CM | POA: Diagnosis not present

## 2022-11-26 DIAGNOSIS — Z713 Dietary counseling and surveillance: Secondary | ICD-10-CM | POA: Diagnosis not present

## 2022-11-26 DIAGNOSIS — N1831 Chronic kidney disease, stage 3a: Secondary | ICD-10-CM | POA: Diagnosis not present

## 2022-11-27 DIAGNOSIS — M25552 Pain in left hip: Secondary | ICD-10-CM | POA: Diagnosis not present

## 2022-11-27 DIAGNOSIS — M5459 Other low back pain: Secondary | ICD-10-CM | POA: Diagnosis not present

## 2022-11-27 DIAGNOSIS — R262 Difficulty in walking, not elsewhere classified: Secondary | ICD-10-CM | POA: Diagnosis not present

## 2022-11-27 DIAGNOSIS — M25652 Stiffness of left hip, not elsewhere classified: Secondary | ICD-10-CM | POA: Diagnosis not present

## 2022-11-27 DIAGNOSIS — R531 Weakness: Secondary | ICD-10-CM | POA: Diagnosis not present

## 2022-12-03 DIAGNOSIS — I10 Essential (primary) hypertension: Secondary | ICD-10-CM | POA: Diagnosis not present

## 2022-12-03 DIAGNOSIS — R262 Difficulty in walking, not elsewhere classified: Secondary | ICD-10-CM | POA: Diagnosis not present

## 2022-12-03 DIAGNOSIS — Z6841 Body Mass Index (BMI) 40.0 and over, adult: Secondary | ICD-10-CM | POA: Diagnosis not present

## 2022-12-03 DIAGNOSIS — E785 Hyperlipidemia, unspecified: Secondary | ICD-10-CM | POA: Diagnosis not present

## 2022-12-03 DIAGNOSIS — M25652 Stiffness of left hip, not elsewhere classified: Secondary | ICD-10-CM | POA: Diagnosis not present

## 2022-12-03 DIAGNOSIS — R531 Weakness: Secondary | ICD-10-CM | POA: Diagnosis not present

## 2022-12-03 DIAGNOSIS — M5459 Other low back pain: Secondary | ICD-10-CM | POA: Diagnosis not present

## 2022-12-03 DIAGNOSIS — E1169 Type 2 diabetes mellitus with other specified complication: Secondary | ICD-10-CM | POA: Diagnosis not present

## 2022-12-03 DIAGNOSIS — M25552 Pain in left hip: Secondary | ICD-10-CM | POA: Diagnosis not present

## 2022-12-03 DIAGNOSIS — N1831 Chronic kidney disease, stage 3a: Secondary | ICD-10-CM | POA: Diagnosis not present

## 2022-12-04 ENCOUNTER — Ambulatory Visit (INDEPENDENT_AMBULATORY_CARE_PROVIDER_SITE_OTHER): Payer: BC Managed Care – PPO | Admitting: Psychiatry

## 2022-12-04 DIAGNOSIS — F411 Generalized anxiety disorder: Secondary | ICD-10-CM

## 2022-12-04 NOTE — Progress Notes (Signed)
[facility] Counselor/Therapist Progress Note  Patient ID: Laurie Simmons, MRN: [id number],    Date: 12/04/2022  Time Spent: 55 minutes   Treatment Type: Individual Therapy  Reported Symptoms: anxiety, depression (some better at times)  Mental Status Exam:  Appearance:   Casual     Behavior:  Appropriate, Sharing, and Motivated  Motor:  Normal  Speech/Language:   Clear and Coherent  Affect:  Anxious, depressed   Mood:  anxious and depressed  Thought process:  goal directed  Thought content:    Rumination  Sensory/Perceptual disturbances:    WNL  Orientation:  oriented to person, place, time/date, situation, day of week, month of year, year, and stated date of Dec 04, 2022  Attention:  Good  Concentration:  Good and Fair  Memory:  Short term memory issues and Dr is aware  Fund of knowledge:   Good  Insight:    Good and Fair  Judgment:   Good  Impulse Control:  Good and Fair   Risk Assessment: Danger to Self:  No Self-injurious Behavior: No Danger to Others: No Duty to Warn:no Physical Aggression / Violence:No  Access to Firearms a concern: No  Gang Involvement:No   Subjective:  Patient in for session today and reporting anxiety and some depression.  Back and hip pain continue. "Trying to make changes but hard to follow through" and discussed this more today. Looks for the negative and imagines "worst case scenario" and wanted/needed and did work on anxiety/worry reduction using specific examples and turning more to her faith as a resource which was helpful for patient. Shared a couple situations where her immediate reaction was to overly stress and worry, and talked her through being able to respond to that stressor in healthier ways. To work on catching herself in situations earlier and be able to use strategies discussed today to help her better manage stressful/fearful situations in less anxious and more emotionally strengthening ways. Long history of worrying. Also to  work on not taking her afternoon nap as she is wanting to sleep better at night. Trying to be more open with people and let them be a support for her also. Also working to decrease her self-defeating self-talk.  Some improvement in her motivation and hopefulness as noted in the scales further below in this note.   Interventions: Cognitive Behavioral Therapy and Ego-Supportive  Long-term goal: Develop healthy cognitive patterns and beliefs about self and the world that lead to alleviation and help prevent the relapse of depressive symptoms Short-term goal: Replace negative self-defeating self-talk with verbalization of realistic and positive cognitive messages. Strategies: Reinforce positive, reality-based cognitive messages that enhance self-confidence and increase adaptive action.   Diagnosis:   ICD-10-CM   1. Generalized anxiety disorder  F41.1      Plan:  Patient in today actively participating in session, better participation noticed today.  Focused more on her anxiety and depression related to personal issues, family issues, and her tendency to over worry.  Remain very involved in session and is to follow-up with some homework about specifically trying to decrease her habit of "instantly worrying".  Patient is making progress overall and needs to continue with goal-directed behaviors to keep moving in a forward direction. Encouraged patient in her practice of more positive and self affirming behaviors as noted in session including: Refrain from assuming things are going to always go in a negative direction, stay in touch with people who are supportive of her, remain in the present and  focused on what she can change or control, get outside some each day, stay on her prescribed medication, healthy nutrition and exercise, trying not to assume worst-case scenarios, positive self talk, challenge and counteract self-doubt, reduce overthinking and over analyzing, and realize the strengths she shows  when working with goal-directed behaviors to move in a direction that supports her improved emotional health and outlook.  Self rating scales: 1-10 depression scale-6 1-10 anxiety scale-5 1-10 self-esteem scale-5 1-10 motivation scale-7 1-10 hopefulness scale-8  Goal review and progress/challenges noted with patient.  Next appointment within 4 to 5 weeks.   Mathis Fare, LCSW

## 2022-12-05 DIAGNOSIS — R531 Weakness: Secondary | ICD-10-CM | POA: Diagnosis not present

## 2022-12-05 DIAGNOSIS — R262 Difficulty in walking, not elsewhere classified: Secondary | ICD-10-CM | POA: Diagnosis not present

## 2022-12-05 DIAGNOSIS — M25652 Stiffness of left hip, not elsewhere classified: Secondary | ICD-10-CM | POA: Diagnosis not present

## 2022-12-05 DIAGNOSIS — M5459 Other low back pain: Secondary | ICD-10-CM | POA: Diagnosis not present

## 2022-12-05 DIAGNOSIS — M25552 Pain in left hip: Secondary | ICD-10-CM | POA: Diagnosis not present

## 2022-12-10 ENCOUNTER — Ambulatory Visit (INDEPENDENT_AMBULATORY_CARE_PROVIDER_SITE_OTHER): Payer: BC Managed Care – PPO | Admitting: *Deleted

## 2022-12-10 DIAGNOSIS — Z713 Dietary counseling and surveillance: Secondary | ICD-10-CM | POA: Diagnosis not present

## 2022-12-10 DIAGNOSIS — Z6841 Body Mass Index (BMI) 40.0 and over, adult: Secondary | ICD-10-CM | POA: Diagnosis not present

## 2022-12-10 DIAGNOSIS — J309 Allergic rhinitis, unspecified: Secondary | ICD-10-CM | POA: Diagnosis not present

## 2022-12-10 DIAGNOSIS — E1169 Type 2 diabetes mellitus with other specified complication: Secondary | ICD-10-CM | POA: Diagnosis not present

## 2022-12-11 ENCOUNTER — Encounter: Payer: Self-pay | Admitting: Physical Medicine and Rehabilitation

## 2022-12-12 DIAGNOSIS — M5459 Other low back pain: Secondary | ICD-10-CM | POA: Diagnosis not present

## 2022-12-12 DIAGNOSIS — M25552 Pain in left hip: Secondary | ICD-10-CM | POA: Diagnosis not present

## 2022-12-12 DIAGNOSIS — M25652 Stiffness of left hip, not elsewhere classified: Secondary | ICD-10-CM | POA: Diagnosis not present

## 2022-12-12 DIAGNOSIS — R262 Difficulty in walking, not elsewhere classified: Secondary | ICD-10-CM | POA: Diagnosis not present

## 2022-12-16 DIAGNOSIS — M5459 Other low back pain: Secondary | ICD-10-CM | POA: Diagnosis not present

## 2022-12-16 DIAGNOSIS — M25652 Stiffness of left hip, not elsewhere classified: Secondary | ICD-10-CM | POA: Diagnosis not present

## 2022-12-16 DIAGNOSIS — R262 Difficulty in walking, not elsewhere classified: Secondary | ICD-10-CM | POA: Diagnosis not present

## 2022-12-16 DIAGNOSIS — M25552 Pain in left hip: Secondary | ICD-10-CM | POA: Diagnosis not present

## 2022-12-18 DIAGNOSIS — M5459 Other low back pain: Secondary | ICD-10-CM | POA: Diagnosis not present

## 2022-12-18 DIAGNOSIS — M25552 Pain in left hip: Secondary | ICD-10-CM | POA: Diagnosis not present

## 2022-12-18 DIAGNOSIS — R262 Difficulty in walking, not elsewhere classified: Secondary | ICD-10-CM | POA: Diagnosis not present

## 2022-12-18 DIAGNOSIS — M25652 Stiffness of left hip, not elsewhere classified: Secondary | ICD-10-CM | POA: Diagnosis not present

## 2022-12-20 ENCOUNTER — Ambulatory Visit (INDEPENDENT_AMBULATORY_CARE_PROVIDER_SITE_OTHER): Payer: BC Managed Care – PPO | Admitting: Adult Health

## 2022-12-20 ENCOUNTER — Encounter: Payer: Self-pay | Admitting: Adult Health

## 2022-12-20 DIAGNOSIS — F331 Major depressive disorder, recurrent, moderate: Secondary | ICD-10-CM | POA: Diagnosis not present

## 2022-12-20 DIAGNOSIS — F410 Panic disorder [episodic paroxysmal anxiety] without agoraphobia: Secondary | ICD-10-CM

## 2022-12-20 DIAGNOSIS — G4700 Insomnia, unspecified: Secondary | ICD-10-CM | POA: Diagnosis not present

## 2022-12-20 DIAGNOSIS — F411 Generalized anxiety disorder: Secondary | ICD-10-CM

## 2022-12-20 MED ORDER — CLORAZEPATE DIPOTASSIUM 3.75 MG PO TABS: ORAL_TABLET | ORAL | 2 refills | Status: DC

## 2022-12-20 NOTE — Progress Notes (Signed)
Laurie Simmons [id number] 07/17/1965 57 y.o.  Subjective:   Patient ID:  Laurie Simmons is a 57 y.o. (DOB [birth year]) female.  Chief Complaint: No chief complaint on file.   HPI Charra Tenbarge Keithly presents to the office today for follow-up of anxiety, panic attacks, depression, insomnia.  Describes mood today as "ok". Pleasant. Decreased tearfulness. Mood symptoms - reports anxiety - random - not always sure what causes it. Decreased depression. Denies irritability. Reports worry, rumination, and over thinking. Reports decreased panic attacks. Mood is stable. Stating "I'm doing alright - gets frustrated at times. Feels like current medications are helpful. Working with a Paramedic. Stable interest and motivation. Taking medications as prescribed. Energy levels lower. Not really active - does not have a regular exercise routine with current physical disabilities.  Enjoys some usual interests and activities. Married. Lives with husband and their two children. Mostly staying home. Appetite adequate. Weight loss -256 from 260 pounds. Working with weight loss management - restarting [place]. Sleeps better some nights than others - waking up several times during the night. Averages 6 hours of broken sleep and then napping during the day - afternoons. Focus and concentration difficulties. Completing some tasks around the house - "I try to". Having issues with back pain which is limiting things she can do. Has been working with P/T and does not feel like it's help. Planning to see a specialist. Currently disabled and receiving SSI benefits. Denies SI or HI.  Denies AH or VH.  Denies self harm. Denies substance abuse.  Therapist - Rockne Menghini.  Reports post Covid symptoms.   Flowsheet Row Admission (Discharged) from 02/13/2022 in [facility]ARDI[facility] CATH [facility]  C-SSRS RISK CATEGORY No Risk        Review of Systems:  Review of Systems  Musculoskeletal:  Negative for gait problem.   Neurological:  Negative for tremors.  Psychiatric/Behavioral:         Please refer to HPI    Medications: I have reviewed the patient's current medications.  Current Outpatient Medications  Medication Sig Dispense Refill   albuterol (VENTOLIN HFA) 108 (90 Base) MCG/ACT inhaler Inhale 2 puffs into the lungs every 4 (four) hours as needed for wheezing or shortness of breath. 18 g 1   anastrozole (ARIMIDEX) 1 MG tablet TAKE 1 TABLET(1 MG) BY MOUTH DAILY 90 tablet 3   ARIPiprazole (ABILIFY) 2 MG tablet Take 1 tablet (2 mg total) by mouth daily. 90 tablet 3   aspirin EC 81 MG tablet Take 1 tablet (81 mg total) by mouth daily. Swallow whole. 90 tablet 3   Azelastine HCl 0.15 % SOLN Can use one spray in each nostril two times daily if needed. (Patient taking differently: Place 1 spray into both nostrils 2 (two) times daily as needed (allergies). Can use one spray in each nostril two times daily if needed.) 90 mL 1   Biotin 40981 MCG TBDP Take 10,000 mcg by mouth daily.     calcium carbonate (TUMS EX) 750 MG chewable tablet Chew 1,500 mg by mouth daily as needed for heartburn.     clorazepate (TRANXENE) 3.75 MG tablet Take one tablet daily as needed for anxiety/panic attacks. (Patient taking differently: Take 3.75 mg by mouth at bedtime as needed for anxiety. Take one tablet daily as needed for anxiety/panic attacks.) 30 tablet 2   Cyanocobalamin (B-12) 5000 MCG CAPS Take 5,000 mcg by mouth daily.     EPINEPHrine (EPIPEN 2-PAK) 0.3 mg/0.3 mL IJ SOAJ injection Use  as directed for life-threatening allergic reaction. (Patient taking differently: Inject 0.3 mg into the muscle as needed for anaphylaxis. Use as directed for life-threatening allergic reaction.) 1 each 3   famotidine (PEPCID) 40 MG tablet TAKE 1 TABLET BY MOUTH TWICE DAILY AS DIRECTED 60 tablet 5   fluticasone (FLONASE) 50 MCG/ACT nasal spray Place 2 sprays into both nostrils daily as needed for allergies or rhinitis.     folic acid (FOLVITE)  1 MG tablet TAKE 1 TABLET BY MOUTH DAILY (Patient taking differently: Take 1 mg by mouth daily.) 30 tablet 5   gabapentin (NEURONTIN) 300 MG capsule Take 1 capsule (300 mg total) by mouth 2 (two) times daily. (Patient taking differently: Take 300 mg by mouth at bedtime.) 60 capsule 3   loratadine (CLARITIN) 10 MG tablet TAKE 1 TABLET BY MOUTH ONCE DAILY AS NEEDED 90 tablet 1   MOUNJARO 5 MG/0.5ML Pen Inject 5 mg into the skin once a week.     ondansetron (ZOFRAN) 4 MG tablet TAKE 1 TABLET BY MOUTH EVERY 4 HOURS AS NEEDED FOR NAUSEA (Patient taking differently: Take 4 mg by mouth every 4 (four) hours as needed for nausea or vomiting.) 30 tablet 0   potassium chloride (KLOR-CON) 10 MEQ tablet TAKE 1 TABLET BY MOUTH TWICE DAILY (Patient taking differently: Take 10 mEq by mouth daily.) 60 tablet 1   prochlorperazine (COMPAZINE) 10 MG tablet TAKE 1 TABLET BY MOUTH EVERY 6 HOURS AS NEEDED FOR NAUSEA (Patient taking differently: Take 10 mg by mouth every 6 (six) hours as needed for nausea or vomiting.) 90 tablet 1   rosuvastatin (CRESTOR) 20 MG tablet Take 1 tablet (20 mg total) by mouth daily. 90 tablet 3   senna-docusate (SENOKOT-S) 8.6-50 MG tablet Take 2 tablets by mouth daily.     topiramate (TOPAMAX) 50 MG tablet Take 1 tablet (50 mg total) by mouth daily. 90 tablet 3   UNABLE TO FIND Inject 1 each as directed See admin instructions. Allergy injections for cats, grass, and dust.     valsartan-hydrochlorothiazide (DIOVAN-HCT) 320-25 MG tablet Take 1 tablet by mouth daily.  0   Vilazodone HCl (VIIBRYD) 40 MG TABS Take 1 tablet (40 mg total) by mouth daily. TAKE 1 TABLET(40 MG) BY MOUTH DAILY 90 tablet 3   WIXELA INHUB 250-50 MCG/ACT AEPB INHALE 1 PUFF BY MOUTH TWICE DAILY TO PREVENT FOR COUGH OR WHEEZE. RINSE MOUTH AFTER USE. 60 each 5   No current facility-administered medications for this visit.    Medication Side Effects: None  Allergies:  Allergies  Allergen Reactions   Effexor [Venlafaxine]  Palpitations   Nsaids Other (See Comments)    Pt reports kidney disease  Pt reports kidney disease, Pt reports kidney disease    Past Medical History:  Diagnosis Date   Abnormal stress test: Anterior wall ischemia cardiac cath after that showing 25% RCA no LAD disease 02/12/2022   Acquired absence of left breast 08/18/2020   Allergic rhinitis 03/10/2015   Anxiety    Anxiety and depression 08/30/2021   Arthritis    Asthma    Bilateral carpal tunnel syndrome 08/25/2018   BMI 40.0-44.9, adult (HCC) 08/23/2019   Breast cancer (HCC) 2021   Carpal tunnel syndrome    Carpal tunnel syndrome    bilateral   Chronic kidney disease    stage 3   Depression    Diabetes (HCC)    Dyslipidemia 03/27/2022   Essential hypertension 02/12/2022   Fatigue 08/30/2021   Fibroid 05/04/2019  Fibroids, intramural 08/09/2019   GERD (gastroesophageal reflux disease)    Hemodynamic instability 03/10/2015   Hypertension    Idiopathic urticaria 03/10/2015   Iron deficiency anemia due to chronic blood loss 08/09/2019   Irritable bowel syndrome    Malignant neoplasm of female breast (HCC) 04/17/2022   Moderate persistent asthma 03/10/2015   Obesity    Pain in both hands 07/16/2018   PMB (postmenopausal bleeding) 04/17/2022   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   Please see review of systems for further details on the patient's review from today.   Objective:   Physical Exam:  LMP 01/23/2011   Physical Exam Constitutional:      General: She is not in acute distress. Musculoskeletal:        General: No deformity.  Neurological:     Mental Status: She is alert and oriented to person, place, and time.      Coordination: Coordination normal.  Psychiatric:        Attention and Perception: Attention and perception normal. She does not perceive auditory or visual hallucinations.        Mood and Affect: Mood normal. Mood is not anxious or depressed. Affect is not labile, blunt, angry or inappropriate.        Speech: Speech normal.        Behavior: Behavior normal.        Thought Content: Thought content normal. Thought content is not paranoid or delusional. Thought content does not include homicidal or suicidal ideation. Thought content does not include homicidal or suicidal plan.        Cognition and Memory: Cognition and memory normal.        Judgment: Judgment normal.     Comments: Insight intact     Lab Review:     Component Value Date/Time   NA 141 05/27/2022 1407   NA 137 02/12/2022 1035   K 3.5 05/27/2022 1407   CL 108 05/27/2022 1407   CO2 25 05/27/2022 1407   GLUCOSE 156 (H) 05/27/2022 1407   BUN 18 05/27/2022 1407   BUN 16 02/12/2022 1035   CREATININE 1.07 (H) 05/27/2022 1407   CALCIUM 9.0 05/27/2022 1407   PROT 7.5 05/27/2022 1407   ALBUMIN 3.7 05/27/2022 1407   AST 17 06/26/2022 0835   AST 16 05/27/2022 1407   ALT 20 06/26/2022 0835   ALT 20 05/27/2022 1407   ALKPHOS 82 05/27/2022 1407   BILITOT 0.4 05/27/2022 1407   GFRNONAA >60 05/27/2022 1407   GFRAA  10/20/2008 2105    >60        The eGFR has been calculated using the MDRD equation. This calculation has not been validated in all clinical situations. eGFR's persistently <60 mL/min signify possible Chronic Kidney Disease.       Component Value Date/Time   WBC 10.1 05/27/2022 1407   WBC 12.3 (H) 10/22/2008 0530   RBC 4.85 05/27/2022 1407   HGB 13.7 05/27/2022 1407   HGB 15.0 02/12/2022 1035   HCT 41.4 05/27/2022 1407   HCT 43.9 02/12/2022 1035   PLT 347 05/27/2022 1407   PLT 329 02/12/2022 1035   MCV 85.4 05/27/2022 1407   MCV 85 02/12/2022 1035   MCV 87 11/07/2020 0000  MCH 28.2 05/27/2022 1407    MCHC 33.1 05/27/2022 1407   RDW 13.7 05/27/2022 1407   RDW 13.1 02/12/2022 1035   LYMPHSABS 1.9 05/27/2022 1407   LYMPHSABS 1.6 02/12/2022 1035   MONOABS 0.7 05/27/2022 1407   EOSABS 0.4 05/27/2022 1407   EOSABS 0.2 02/12/2022 1035   BASOSABS 0.1 05/27/2022 1407   BASOSABS 0.1 02/12/2022 1035    No results found for: "POCLITH", "LITHIUM"   No results found for: "PHENYTOIN", "PHENOBARB", "VALPROATE", "CBMZ"   .res Assessment: Plan:    Plan:  1. Viibryd 40mg  daily 2. Tranxene 3.75mg  once daily 3. Topamax 50 mg daily 4. Abilify 2mg  daily  RTC 4 months  Patient advised to contact office with any questions, adverse effects, or acute worsening in signs and symptoms.  Discussed potential benefits, risk, and side effects of benzodiazepines to include potential risk of tolerance and dependence, as well as possible drowsiness. Advised patient not to drive if experiencing drowsiness and to take lowest possible effective dose to minimize risk of dependence and tolerance.  Discussed potential metabolic side effects associated with atypical antipsychotics, as well as potential risk for movement side effects. Advised pt to contact office if movement side effects occur.    There are no diagnoses linked to this encounter.   Please see After Visit Summary for patient specific instructions.  Future Appointments  Date Time Provider Department Center  12/25/2022  8:30 AM Kozlow, Alvira Philips, MD [facility] None  01/01/2023  9:00 AM Mathis Fare, LCSW [facility] None  01/15/2023 10:40 AM Angelina Sheriff, DO [facility]  01/29/2023 11:00 AM Mathis Fare, LCSW [facility] None  03/28/2023  4:00 PM [facility] None  03/28/2023  4:30 PM Dellia Beckwith, MD [facility] None    No orders of the defined types were placed in this encounter.   -------------------------------

## 2022-12-25 ENCOUNTER — Encounter: Payer: Self-pay | Admitting: Allergy and Immunology

## 2022-12-25 ENCOUNTER — Ambulatory Visit (INDEPENDENT_AMBULATORY_CARE_PROVIDER_SITE_OTHER): Payer: BC Managed Care – PPO | Admitting: Allergy and Immunology

## 2022-12-25 VITALS — BP 110/82 | HR 94 | Resp 18

## 2022-12-25 DIAGNOSIS — J4540 Moderate persistent asthma, uncomplicated: Secondary | ICD-10-CM

## 2022-12-25 DIAGNOSIS — K219 Gastro-esophageal reflux disease without esophagitis: Secondary | ICD-10-CM | POA: Diagnosis not present

## 2022-12-25 DIAGNOSIS — J3089 Other allergic rhinitis: Secondary | ICD-10-CM | POA: Diagnosis not present

## 2022-12-25 MED ORDER — AIRSUPRA 90-80 MCG/ACT IN AERO: 2.0000 | INHALATION_SPRAY | RESPIRATORY_TRACT | 1 refills | Status: AC | PRN

## 2022-12-25 MED ORDER — EPINEPHRINE 0.3 MG/0.3ML IJ SOAJ: INTRAMUSCULAR | 3 refills | Status: DC

## 2022-12-25 NOTE — Progress Notes (Signed)
[place] - [place] - [place] - [place] [facility] [place]   Follow-up Note  Referring Provider: Abner Greenspan, MD Primary Provider: Abner Greenspan, MD Date of Office Visit: 12/25/2022  Subjective:   Laurie Simmons (DOB: 03/26/1966) is a 57 y.o. female who [facility] in re-evaluation of the following:  HPI: Laurie Simmons returns to this clinic in reevaluation of asthma, allergic rhinoconjunctivitis, reflux.  I last saw her in this clinic 27 June 2022.  She has done very well with her upper airway without any significant issues while using Flonase a few times per week.  She has not required an antibiotic to treat an episode of sinusitis.  She continues on immunotherapy without adverse effect.  She has done very well with her asthma while consistently using Wixela mostly 1 time per day.  She has a rare requirement for short acting bronchodilator and she can exert herself without any problem and has not required a systemic steroid to treat an exacerbation.  Her reflux remains under very good control while using famotidine twice a day.  Allergies as of 12/25/2022       Reactions   Effexor [venlafaxine] Palpitations   Nsaids Other (See Comments)   Pt reports kidney disease Pt reports kidney disease, Pt reports kidney disease        Medication List    albuterol 108 (90 Base) MCG/ACT inhaler Commonly known as: VENTOLIN HFA Inhale 2 puffs into the lungs every 4 (four) hours as needed for wheezing or shortness of breath.   anastrozole 1 MG tablet Commonly known as: ARIMIDEX TAKE 1 TABLET(1 MG) BY MOUTH DAILY   ARIPiprazole 2 MG tablet Commonly known as: Abilify Take 1 tablet (2 mg total) by mouth daily.   aspirin EC 81 MG tablet Take 1 tablet (81 mg total) by mouth daily. Swallow whole.   Azelastine HCl 0.15 % Soln Can use one spray in each nostril two times daily if needed.   B-12 5000 MCG Caps Take 5,000 mcg by mouth daily.   Biotin  09811 MCG Tbdp Take 10,000 mcg by mouth daily.   calcium carbonate 750 MG chewable tablet Commonly known as: TUMS EX Chew 1,500 mg by mouth daily as needed for heartburn.   clorazepate 3.75 MG tablet Commonly known as: TRANXENE Take one tablet daily as needed for anxiety/panic attacks.   EPINEPHrine 0.3 mg/0.3 mL Soaj injection Commonly known as: EpiPen 2-Pak Use as directed for life-threatening allergic reaction.   famotidine 40 MG tablet Commonly known as: PEPCID TAKE 1 TABLET BY MOUTH TWICE DAILY AS DIRECTED   fluticasone 50 MCG/ACT nasal spray Commonly known as: FLONASE Place 2 sprays into both nostrils daily as needed for allergies or rhinitis.   folic acid 1 MG tablet Commonly known as: FOLVITE TAKE 1 TABLET BY MOUTH DAILY   gabapentin 300 MG capsule Commonly known as: NEURONTIN Take 1 capsule (300 mg total) by mouth 2 (two) times daily.   loratadine 10 MG tablet Commonly known as: CLARITIN TAKE 1 TABLET BY MOUTH ONCE DAILY AS NEEDED   Mounjaro 5 MG/0.5ML Pen Generic drug: tirzepatide Inject 5 mg into the skin once a week.   ondansetron 4 MG tablet Commonly known as: ZOFRAN TAKE 1 TABLET BY MOUTH EVERY 4 HOURS AS NEEDED FOR NAUSEA   potassium chloride 10 MEQ tablet Commonly known as: KLOR-CON TAKE 1 TABLET BY MOUTH TWICE DAILY   prochlorperazine 10 MG tablet Commonly known as: COMPAZINE TAKE 1 TABLET BY  MOUTH EVERY 6 HOURS AS NEEDED FOR NAUSEA   rosuvastatin 20 MG tablet Commonly known as: CRESTOR Take 1 tablet (20 mg total) by mouth daily.   senna-docusate 8.6-50 MG tablet Commonly known as: Senokot-S Take 2 tablets by mouth daily.   topiramate 50 MG tablet Commonly known as: TOPAMAX Take 1 tablet (50 mg total) by mouth daily.   UNABLE TO FIND Inject 1 each as directed See admin instructions. Allergy injections for cats, grass, and dust.   valsartan-hydrochlorothiazide 320-25 MG tablet Commonly known as: DIOVAN-HCT Take 1 tablet by mouth  daily.   Vilazodone HCl 40 MG Tabs Commonly known as: VIIBRYD Take 1 tablet (40 mg total) by mouth daily. TAKE 1 TABLET(40 MG) BY MOUTH DAILY   Wixela Inhub 250-50 MCG/ACT Aepb Generic drug: fluticasone-salmeterol INHALE 1 PUFF BY MOUTH TWICE DAILY TO PREVENT FOR COUGH OR WHEEZE. RINSE MOUTH AFTER USE.    Past Medical History:  Diagnosis Date   Abnormal stress test: Anterior wall ischemia cardiac cath after that showing 25% RCA no LAD disease 02/12/2022   Acquired absence of left breast 08/18/2020   Allergic rhinitis 03/10/2015   Anxiety    Anxiety and depression 08/30/2021   Arthritis    Asthma    Bilateral carpal tunnel syndrome 08/25/2018   BMI 40.0-44.9, adult (HCC) 08/23/2019   Breast cancer (HCC) 2021   Carpal tunnel syndrome    Carpal tunnel syndrome    bilateral   Chronic kidney disease    stage 3   Depression    Diabetes (HCC)    Dyslipidemia 03/27/2022   Essential hypertension 02/12/2022   Fatigue 08/30/2021   Fibroid 05/04/2019   Fibroids, intramural 08/09/2019   GERD (gastroesophageal reflux disease)    Hemodynamic instability 03/10/2015   Hypertension    Idiopathic urticaria 03/10/2015   Iron deficiency anemia due to chronic blood loss 08/09/2019   Irritable bowel syndrome    Malignant neoplasm of female breast (HCC) 04/17/2022   Moderate persistent asthma 03/10/2015   Obesity    Pain in both hands 07/16/2018   PMB (postmenopausal bleeding) 04/17/2022   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   Severe anemia 12/14/2015   Type 2 diabetes mellitus without complication, without long-term current use of insulin (HCC) 02/12/2022    Past Surgical History:  Procedure Laterality Date   caesarean     CESAREAN SECTION     CHOLECYSTECTOMY     DILATION AND CURETTAGE OF UTERUS     HIP SURGERY Left    LEFT HEART CATH AND CORONARY ANGIOGRAPHY N/A 02/13/2022   Procedure: LEFT HEART CATH AND CORONARY ANGIOGRAPHY;  Surgeon: Corky Crafts, MD;  Location: [facility] INVASIVE CV LAB;  Service: Cardiovascular;  Laterality: N/A;   MASTECTOMY     PORT A CATH INJECTION ([facility] HX)  2021    Review of systems negative except as noted in HPI / PMHx or noted below:  Review of Systems  Constitutional: Negative.   HENT: Negative.    Eyes: Negative.   Respiratory: Negative.    Cardiovascular: Negative.   Gastrointestinal: Negative.   Genitourinary: Negative.   Musculoskeletal: Negative.   Skin: Negative.   Neurological: Negative.   Endo/Heme/Allergies: Negative.   Psychiatric/Behavioral: Negative.       Objective:   Vitals:   12/25/22 0857  BP: 110/82  Pulse: 94  Resp: 18  SpO2: 96%          Physical Exam Constitutional:      Appearance: She is  not diaphoretic.  HENT:     Head: Normocephalic.     Right Ear: Tympanic membrane, ear canal and external ear normal.     Left Ear: Tympanic membrane, ear canal and external ear normal.     Nose: Nose normal. No mucosal edema or rhinorrhea.     Mouth/Throat:     Pharynx: Uvula midline. No oropharyngeal exudate.  Eyes:     Conjunctiva/sclera: Conjunctivae normal.  Neck:     Thyroid: No thyromegaly.     Trachea: Trachea normal. No tracheal tenderness or tracheal deviation.  Cardiovascular:     Rate and Rhythm: Normal rate and regular rhythm.     Heart sounds: Normal heart sounds, S1 normal and S2 normal. No murmur heard. Pulmonary:     Effort: No respiratory distress.     Breath sounds: Normal breath sounds. No stridor. No wheezing or rales.  Lymphadenopathy:     Head:     Right side of head: No tonsillar adenopathy.     Left side of head: No tonsillar adenopathy.     Cervical: No cervical adenopathy.  Skin:    Findings: No erythema or rash.     Nails: There is no clubbing.  Neurological:     Mental Status: She is alert.     Diagnostics:    Spirometry was performed and demonstrated an FEV1 of 2.52 at 96 % of predicted.  Assessment and Plan:   1.  Asthma, moderate persistent, well-controlled   2. Other allergic rhinitis   3. LPRD (laryngopharyngeal reflux disease)     1. Continue Wixela 250 - 1 inhalations 1-2 times per day    2. Continue Flonase - 1 spray each nostril 3-7 times per week  3. Continue immunotherapy and EpiPen  4. Continue famotidine 40 mg -1 tablet 2 times per day  5. If needed:   A. Airsupra - 2 inhalations every 6 hours (replaces albuterol)  B. OTC antihistamime  C. Nasal saline  D. Azelastine 1 spray each nostril 2 times per day  6. Return to clinic in 6 months or earlier if problem  7. Plan for fall flu vaccine   Overall Richele is doing quite well while utilizing immunotherapy and a collection of anti-inflammatory agents at relatively low-dose for her airway and also addressing the issue of reflux.  Assuming she continues to do well with this plan I will see her back in this clinic in 6 months or earlier if there is a problem.  Laurette Schimke, MD Allergy / Immunology [facility]

## 2022-12-25 NOTE — Patient Instructions (Signed)
  1. Continue Wixela 250 - 1 inhalations 1-2 times per day    2. Continue Flonase - 1 spray each nostril 3-7 times per week  3. Continue immunotherapy and EpiPen  4. Continue famotidine 40 mg -1 tablet 2 times per day  5. If needed:   A. Airsupra - 2 inhalations every 6 hours (replaces albuterol)  B. OTC antihistamime  C. Nasal saline  D. Azelastine 1 spray each nostril 2 times per day  6. Return to clinic in 6 months or earlier if problem  7. Plan for fall flu vaccine

## 2022-12-26 ENCOUNTER — Encounter: Payer: Self-pay | Admitting: Allergy and Immunology

## 2022-12-31 DIAGNOSIS — E1169 Type 2 diabetes mellitus with other specified complication: Secondary | ICD-10-CM | POA: Diagnosis not present

## 2022-12-31 DIAGNOSIS — Z713 Dietary counseling and surveillance: Secondary | ICD-10-CM | POA: Diagnosis not present

## 2022-12-31 DIAGNOSIS — Z6841 Body Mass Index (BMI) 40.0 and over, adult: Secondary | ICD-10-CM | POA: Diagnosis not present

## 2023-01-01 ENCOUNTER — Ambulatory Visit (INDEPENDENT_AMBULATORY_CARE_PROVIDER_SITE_OTHER): Payer: BC Managed Care – PPO | Admitting: Psychiatry

## 2023-01-01 DIAGNOSIS — F331 Major depressive disorder, recurrent, moderate: Secondary | ICD-10-CM

## 2023-01-01 NOTE — Progress Notes (Signed)
[facility] Counselor/Therapist Progress Note  Patient ID: Laurie Simmons, MRN: [id number],    Date: 01/01/2023  Time Spent: 55 minutes  Treatment Type: Individual Therapy  Reported Symptoms: less motivated, depressed "not interested in doing housework and other things I need to do", "some anxiety", parenting issues  Mental Status Exam:  Appearance:   Casual     Behavior:  Appropriate and Sharing  Motor:  Normal  Speech/Language:   Clear and Coherent  Affect:  Depressed  Mood:  Some anxiety  Thought process:  goal directed  Thought content:    Mostly at night some obsessive thoughts when someone else hurts me  Sensory/Perceptual disturbances:    WNL  Orientation:  oriented to person, place, time/date, situation, day of week, month of year, year, and stated date of January 01, 2023  Attention:  Good  Concentration:  Good  Memory:  Some short term memory issues   Fund of knowledge:   Good  Insight:    Good  Judgment:   Good  Impulse Control:  Good   Risk Assessment: Danger to Self:  No Self-injurious Behavior: No Danger to Others: No Duty to Warn:no Physical Aggression / Violence:No  Access to Firearms a concern: No  Gang Involvement:No   Subjective:  Patient in session today reporting depression, with some anxiety, continuing and motivation issues and hopeful things will get better.  Symptoms are mostly related to personal and family issues, and her lack of motivation and feeling overwhelmed and getting things done at home.  Has ordered a new keyboard and "am excited about that". Hoping that will motivated her in other ways too and "get more things done." Back pain improved.  Encouraged to not always look for what the longer might go wrong.  Some improvement in looking for the positives versus worst case scenarios, but this is not a habit yet. Hard to follow through on goal directed behaviors but is putting forth some effort. Discussed strategies with patient that can help her  with motivation and follow through. Working with some resistance. Encouraged to consider not napping during the day and have better sleep at night. Trying to notice positives more often than negatives. Decrease negative self-talk.   Interventions: Cognitive Behavioral Therapy and Ego-Supportive  Long-term goal: Develop healthy cognitive patterns and beliefs about self and the world that lead to alleviation and help prevent the relapse of depressive symptoms Short-term goal: Replace negative self-defeating self-talk with verbalization of realistic and positive cognitive messages. Strategies: Reinforce positive, reality-based cognitive messages that enhance self-confidence and increase adaptive action.  Diagnosis:   ICD-10-CM   1. Major depressive disorder, recurrent episode, moderate (HCC)  F33.1      Plan:   Patient in today participating well in session working further on her anxiety and depression, and lack of follow through on strategies. Denies any SI. Discussed this in session today and patient seemed to respond favorably.  Has made some progress and needs to continue working with goal directed behaviors and "my positives listing." Encouraged patient in her practice of more positive and self affirming behaviors as noted in session including: Refraining from assuming things are always going to go in a negative direction, stay in touch with people who are supportive of her, remain in the present and focused on what she can change or control, get outside some each day, remain on her prescribed medication, healthy nutrition and exercise, trying not to assume worst-case scenarios, positive self talk, challenge and counteract self-doubt,  reduce her overthinking and over analyzing, and recognize the strength she can show when working with goal-directed behaviors to move in a direction that supports her improved emotional health and overall wellbeing.  Self rating scales: 1-10 depression  scale-5 1-10 anxiety scale-6 1-10 self-esteem scale-5 1-10 motivation scale-2/3 1-10 hopefulness scale-6  Goal review and progress/challenges noted with patient.  Next appointment within 3 to 4 weeks.   Mathis Fare, LCSW

## 2023-01-08 ENCOUNTER — Ambulatory Visit (INDEPENDENT_AMBULATORY_CARE_PROVIDER_SITE_OTHER): Payer: BC Managed Care – PPO

## 2023-01-08 DIAGNOSIS — J309 Allergic rhinitis, unspecified: Secondary | ICD-10-CM | POA: Diagnosis not present

## 2023-01-14 DIAGNOSIS — Z6841 Body Mass Index (BMI) 40.0 and over, adult: Secondary | ICD-10-CM | POA: Diagnosis not present

## 2023-01-14 DIAGNOSIS — Z713 Dietary counseling and surveillance: Secondary | ICD-10-CM | POA: Diagnosis not present

## 2023-01-14 NOTE — Progress Notes (Unsigned)
Subjective:    Patient ID: Laurie Simmons, female    DOB: [birth year], 57 y.o.   MRN: [id number]  HPI   Pain Inventory Average Pain {NUMBERS; 0-10:5044} Pain Right Now {NUMBERS; 0-10:5044} My pain is {PAIN DESCRIPTION:21022940}  In the last 24 hours, has pain interfered with the following? General activity {NUMBERS; 0-10:5044} Relation with others {NUMBERS; 0-10:5044} Enjoyment of life {NUMBERS; 0-10:5044} What TIME of day is your pain at its worst? {time of day:24191} Sleep (in general) {BHH GOOD/FAIR/POOR:22877}  Pain is worse with: {ACTIVITIES:21022942} Pain improves with: {PAIN IMPROVES XBJY:78295621} Relief from Meds: {NUMBERS; 0-10:5044}  Family History  Problem Relation Age of Onset   Kidney disease Mother    Pulmonary fibrosis Father    Cancer Maternal Grandmother        carcinoma of the vulva and possible ovarian cancer   Prostate cancer Maternal Grandfather    Lung cancer Paternal Grandfather        heavy smoker   Social History   Socioeconomic History   Marital status: Married    Spouse name: Phil   Number of children: 2   Years of education: Not on file   Highest education level: Not on file  Occupational History   Not on file  Tobacco Use   Smoking status: Never   Smokeless tobacco: Never  Vaping Use   Vaping Use: Never used  Substance and Sexual Activity   Alcohol use: No   Drug use: No   Sexual activity: Yes  Other Topics Concern   Not on file  Social History Narrative   Not on file   Social Determinants of Health   Financial Resource Strain: Not on file  Food Insecurity: Not on file  Transportation Needs: Not on file  Physical Activity: Not on file  Stress: Not on file  Social Connections: Not on file   Past Surgical History:  Procedure Laterality Date   caesarean     CESAREAN SECTION     CHOLECYSTECTOMY     DILATION AND CURETTAGE OF UTERUS     HIP SURGERY Left    LEFT HEART CATH AND CORONARY ANGIOGRAPHY N/A 02/13/2022    Procedure: LEFT HEART CATH AND CORONARY ANGIOGRAPHY;  Surgeon: Corky Crafts, MD;  Location: [facility]IVE [facility];  Service: Cardiovascular;  Laterality: N/A;   MASTECTOMY     PORT A CATH INJECTION ([facility] HX)  2021   Past Surgical History:  Procedure Laterality Date   caesarean     CESAREAN SECTION     CHOLECYSTECTOMY     DILATION AND CURETTAGE OF UTERUS     HIP SURGERY Left    LEFT HEART CATH AND CORONARY ANGIOGRAPHY N/A 02/13/2022   Procedure: LEFT HEART CATH AND CORONARY ANGIOGRAPHY;  Surgeon: Corky Crafts, MD;  Location: [facility] INVASIVE CV LAB;  Service: Cardiovascular;  Laterality: N/A;   MASTECTOMY     PORT A CATH INJECTION ([facility] HX)  2021   Past Medical History:  Diagnosis Date   Abnormal stress test: Anterior wall ischemia cardiac cath after that showing 25% RCA no LAD disease 02/12/2022   Acquired absence of left breast 08/18/2020   Allergic rhinitis 03/10/2015   Anxiety    Anxiety and depression 08/30/2021   Arthritis    Asthma    Bilateral carpal tunnel syndrome 08/25/2018   BMI 40.0-44.9, adult (HCC) 08/23/2019   Breast cancer (HCC) 2021   Carpal tunnel syndrome    Carpal tunnel syndrome    bilateral   Chronic kidney  disease    stage 3   Depression    Diabetes (HCC)    Dyslipidemia 03/27/2022   Essential hypertension 02/12/2022   Fatigue 08/30/2021   Fibroid 05/04/2019   Fibroids, intramural 08/09/2019   GERD (gastroesophageal reflux disease)    Hemodynamic instability 03/10/2015   Hypertension    Idiopathic urticaria 03/10/2015   Iron deficiency anemia due to chronic blood loss 08/09/2019   Irritable bowel syndrome    Malignant neoplasm of female breast (HCC) 04/17/2022   Moderate persistent asthma 03/10/2015   Obesity    Pain in both hands 07/16/2018   PMB (postmenopausal bleeding) 04/17/2022   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

## 2023-01-15 ENCOUNTER — Encounter
Payer: BC Managed Care – PPO | Attending: Physical Medicine and Rehabilitation | Admitting: Physical Medicine and Rehabilitation

## 2023-01-15 ENCOUNTER — Encounter: Payer: Self-pay | Admitting: Physical Medicine and Rehabilitation

## 2023-01-15 VITALS — BP 128/85 | Ht 65.0 in | Wt 257.0 lb

## 2023-01-15 DIAGNOSIS — M2170 Unequal limb length (acquired), unspecified site: Secondary | ICD-10-CM | POA: Insufficient documentation

## 2023-01-15 DIAGNOSIS — M545 Low back pain, unspecified: Secondary | ICD-10-CM

## 2023-01-15 DIAGNOSIS — R1032 Left lower quadrant pain: Secondary | ICD-10-CM | POA: Diagnosis not present

## 2023-01-15 DIAGNOSIS — M25512 Pain in left shoulder: Secondary | ICD-10-CM

## 2023-01-15 DIAGNOSIS — G894 Chronic pain syndrome: Secondary | ICD-10-CM

## 2023-01-15 NOTE — Progress Notes (Signed)
Subjective:    Patient ID: Laurie Simmons, female    DOB: [birth year], 57 y.o.   MRN: [id number]  HPI HPI  Laurie Simmons is a 57 y.o. year old female  who  has a past medical history of Abnormal stress test: Anterior wall ischemia cardiac cath after that showing 25% RCA no LAD disease (02/12/2022), Acquired absence of left breast (08/18/2020), Allergic rhinitis (03/10/2015), Anxiety, Anxiety and depression (08/30/2021), Arthritis, Asthma, Bilateral carpal tunnel syndrome (08/25/2018), BMI 40.0-44.9, adult (HCC) (08/23/2019), Breast cancer (HCC) (2021), Carpal tunnel syndrome, Carpal tunnel syndrome, Chronic kidney disease, Depression, Diabetes (HCC), Dyslipidemia (03/27/2022), Essential hypertension (02/12/2022), Fatigue (08/30/2021), Fibroid (05/04/2019), Fibroids, intramural (08/09/2019), GERD (gastroesophageal reflux disease), Hemodynamic instability (03/10/2015), Hypertension, Idiopathic urticaria (03/10/2015), Iron deficiency anemia due to chronic blood loss (08/09/2019), Irritable bowel syndrome, Malignant neoplasm of female breast (HCC) (04/17/2022), Moderate persistent asthma (03/10/2015), Obesity, Pain in both hands (07/16/2018), PMB (postmenopausal bleeding) (04/17/2022), 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   They are presenting to PM&R clinic as a new patient for pain management evaluation. They were referred by Abner Greenspan, MD for treatment of chronic low back pain.   Source: Bilateral low back, non-radiating - also new L thigh pain and "shooting out"  Inciting incident:  1) Born with a dislocated hip and was not found until she was 57 year old; had to break the left leg and re-set it, resulting in a longer left leg 2) 2021, started right after her mastectomy; she wonders if she was shifted or dropped during surgery because  it started as soon as she got home.  Description of pain: intermittent and aching, only when she is "up and moving"  Exacerbating factors: standing and activity Remitting factors: relaxation Red flag symptoms: No red flags for back pain endorsed in Hx or ROS  Medications tried: Topical medications (never tried) :  Nsaids ( caontraindicated d/t renal dz ) :  Tylenol  (no effect) : Takes 2 tabs 650 mg tabs once every other day Opiates  (never tried) :  Gabapentin / Lyrica  (moderate effect) : Takes gabapentin at nighttime PRN for carpal tunnel TCAs  (never tried) :  SNRIs  (never tried) :  Other  (never tried) : none; has wlel controlled Dm (HA1C <7) but never had steroids  Other treatments: PT/OT  (no effect) : Tried in 2021; helped a little bit; went back to PT this year without benefit.  Accupuncture/chiropractor/massage  (never tried) :  TENs unit (never tried) :  Injections (no effect) :  Surgery (never tried) :  Other  (no effect) : Has a heel left 1/2 inch for her right foot; does not effect her pain or gait  Goals for pain control: Would like to be able to do chores, ADLs; limited and has to rest frequently d/t pain. Walking on trails with her family.   Prior UDS results: No results found for: "LABOPIA", "COCAINSCRNUR", "LABBENZ", "AMPHETMU", "THCU", "LABBARB"    Pain Inventory Average Pain 7 Pain Right Now 2 My pain is intermittent and aching  In the last 24 hours, has pain interfered with the following? General activity 8 Relation with others 0 Enjoyment of life 5 What TIME of day is your pain at its worst? daytime Sleep (in general) Good  Pain is worse with: walking, standing, and some activites Pain improves with: rest Relief from Meds:  not  taking pain medication   walk without assistance how many minutes can you walk? 5-10 ability to climb steps?  yes do you drive?  yes Do you have any goals in this area?  yes  retired  weakness anxiety  Any  changes since last visit?  no  Any changes since last visit?  no    Family History  Problem Relation Age of Onset   Kidney disease Mother    Pulmonary fibrosis Father    Cancer Maternal Grandmother        carcinoma of the vulva and possible ovarian cancer   Prostate cancer Maternal Grandfather    Lung cancer Paternal Grandfather        heavy smoker   Social History   Socioeconomic History   Marital status: Married    Spouse name: Phil   Number of children: 2   Years of education: Not on file   Highest education level: Not on file  Occupational History   Not on file  Tobacco Use   Smoking status: Never   Smokeless tobacco: Never  Vaping Use   Vaping Use: Never used  Substance and Sexual Activity   Alcohol use: No   Drug use: No   Sexual activity: Yes  Other Topics Concern   Not on file  Social History Narrative   Not on file   Social Determinants of Health   Financial Resource Strain: Not on file  Food Insecurity: Not on file  Transportation Needs: Not on file  Physical Activity: Not on file  Stress: Not on file  Social Connections: Not on file   Past Surgical History:  Procedure Laterality Date   caesarean     CESAREAN SECTION     CHOLECYSTECTOMY     DILATION AND CURETTAGE OF UTERUS     HIP SURGERY Left    LEFT HEART CATH AND CORONARY ANGIOGRAPHY N/A 02/13/2022   Procedure: LEFT HEART CATH AND CORONARY ANGIOGRAPHY;  Surgeon: Corky Crafts, MD;  Location: [facility] INVASIVE CV [facility];  Service: Cardiovascular;  Laterality: N/A;   MASTECTOMY     PORT A CATH INJECTION ([facility] HX)  2021   Past Medical History:  Diagnosis Date   Abnormal stress test: Anterior wall ischemia cardiac cath after that showing 25% RCA no LAD disease 02/12/2022   Acquired absence of left breast 08/18/2020   Allergic rhinitis 03/10/2015   Anxiety    Anxiety and depression 08/30/2021   Arthritis    Asthma    Bilateral carpal tunnel syndrome 08/25/2018   BMI 40.0-44.9, adult (HCC)  08/23/2019   Breast cancer (HCC) 2021   Carpal tunnel syndrome    Carpal tunnel syndrome    bilateral   Chronic kidney disease    stage 3   Depression    Diabetes (HCC)    Dyslipidemia 03/27/2022   Essential hypertension 02/12/2022   Fatigue 08/30/2021   Fibroid 05/04/2019   Fibroids, intramural 08/09/2019   GERD (gastroesophageal reflux disease)    Hemodynamic instability 03/10/2015   Hypertension    Idiopathic urticaria 03/10/2015   Iron deficiency anemia due to chronic blood loss 08/09/2019   Irritable bowel syndrome    Malignant neoplasm of female breast (HCC) 04/17/2022   Moderate persistent asthma 03/10/2015   Obesity    Pain in both hands 07/16/2018   PMB (postmenopausal bleeding) 04/17/2022   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   Severe anemia 12/14/2015   Type 2 diabetes mellitus without complication, without long-term current use  of insulin (HCC) 02/12/2022   BP 128/85   Ht 5\' 5"  (1.651 m)   Wt 257 lb (116.6 kg)   LMP 01/23/2011   SpO2 99%   BMI 42.77 kg/m   Opioid Risk Score:   Fall Risk Score:  `1  Depression screen [facility] 2/9     01/15/2023   10:40 AM  Depression screen PHQ 2/9  Decreased Interest 1  Down, Depressed, Hopeless 1  PHQ - 2 Score 2  Altered sleeping 1  Tired, decreased energy 1  Change in appetite 0  Feeling bad or failure about yourself  0  Trouble concentrating 1  Moving slowly or fidgety/restless 0  Suicidal thoughts 0  PHQ-9 Score 5    Review of Systems  Musculoskeletal:  Positive for back pain.  All other systems reviewed and are negative.     Objective:   Physical Exam   PE: Constitution: Appropriate appearance for age. No apparent distress  +Obese Resp: No respiratory distress. No accessory muscle usage. on RA and CTAB Cardio: Well perfused appearance. No peripheral edema. Abdomen: Nondistended. Nontender.   Psych: Appropriate mood and affect. Neuro: AAOx4. No apparent cognitive  deficits   Neurologic Exam:   DTRs: Reflexes were 2+ in bilateral achilles, patella, biceps, BR and triceps. Babinsky: flexor responses b/l.   Hoffmans: negative b/l Sensory exam: revealed normal sensation in all dermatomal regions in bilateral upper extremities and bilateral lower extremities Motor exam: strength 5/5 throughout bilateral upper extremities and bilateral lower extremities Coordination: Fine motor coordination was normal.   Gait: normal   Back Exam:   Inspection: Pelvis was even.  Lumbar lordotic curvature was  wnl .  There was no evidence of scoliosis. +Bilateral  Genu valgum.  Palpation: Palpatory exam revealed ttp at the left posterior shoulder and deltoid, bilateral lumbar paraspinals . There was no evidence of spasm.   Special/provocative testing:    SLR: -   Slump test: -    Facet loading: -(very non-specific)   TTP at paraspinals: +(sensitive for facet pain...if no ttp then likely not facet pain)   Pearlean Brownie test: + L groin pain   FAIR test: + L groin pain   Thomas Test: -   Information in () parenthesis is normals/details of specific exam.       Assessment & Plan:   Laurie Simmons is a 57 y.o. year old female  who  has a past medical history of Abnormal stress test: Anterior wall ischemia cardiac cath after that showing 25% RCA no LAD disease (02/12/2022), Acquired absence of left breast (08/18/2020), Allergic rhinitis (03/10/2015), Anxiety, Anxiety and depression (08/30/2021), Arthritis, Asthma, Bilateral carpal tunnel syndrome (08/25/2018), BMI 40.0-44.9, adult (HCC) (08/23/2019), Breast cancer (HCC) (2021), Carpal tunnel syndrome, Carpal tunnel syndrome, Chronic kidney disease, Depression, Diabetes (HCC), Dyslipidemia (03/27/2022), Essential hypertension (02/12/2022), Fatigue (08/30/2021), Fibroid (05/04/2019), Fibroids, intramural (08/09/2019), GERD (gastroesophageal reflux disease), Hemodynamic instability (03/10/2015), Hypertension, Idiopathic urticaria  (03/10/2015), Iron deficiency anemia due to chronic blood loss (08/09/2019), Irritable bowel syndrome, Malignant neoplasm of female breast (HCC) (04/17/2022), Moderate persistent asthma (03/10/2015), Obesity, Pain in both hands (07/16/2018), PMB (postmenopausal bleeding) (04/17/2022), 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   They are presenting to PM&R clinic as a new patient for pain management evaluation. They were referred by Abner Greenspan, MD for treatment of chronic low back pain.    Chronic pain syndrome Midline low back  pain without sciatica, unspecified chronicity -     DG Lumbar Spine 2-3 Views; Future  Obtain Voltaren gel over-the-counter and use it up to 4 times daily on your low back for adjunctive pain control.  Return to clinic in 1 to 2 weeks for trigger point injections  If there are any concerning findings on the low back x-ray as above, I will call let you know.  Otherwise we can review it during your next visit.  Acquired leg length discrepancy Left groin pain -     DG HIP UNILAT W OR W/O PELVIS 2-3 VIEWS LEFT; Future  I am additionally getting imaging of your left hip, due to suspected osteoarthritis.  If this is seen, I will schedule you an appointment with Dr. Wynn Banker for fluoroscopic guided injection into the left hip to help with pain.  If not, we can discuss the findings next visit.  Acute pain of left shoulder  Follow-up for trigger point injections in 2 weeks.  He can also use Voltaren gel on your neck and shoulder.  If no improvements by next visit or trigger point injections, we may get imaging to further follow-up on this, however there were no concerning findings today.

## 2023-01-15 NOTE — Patient Instructions (Signed)
Chronic pain syndrome Midline low back pain without sciatica, unspecified chronicity -     DG Lumbar Spine 2-3 Views; Future  Obtain Voltaren gel over-the-counter and use it up to 4 times daily on your low back for adjunctive pain control.  Return to clinic in 1 to 2 weeks for trigger point injections  If there are any concerning findings on the low back x-ray as above, I will call let you know.  Otherwise we can review it during your next visit.  Acquired leg length discrepancy Left groin pain -     DG HIP UNILAT W OR W/O PELVIS 2-3 VIEWS LEFT; Future  I am additionally getting imaging of your left hip, due to suspected osteoarthritis.  If this is seen, I will schedule you an appointment with Dr. Wynn Banker for fluoroscopic guided injection into the left hip to help with pain.  If not, we can discuss the findings next visit.  Acute pain of left shoulder  Follow-up for trigger point injections in 2 weeks.  He can also use Voltaren gel on your neck and shoulder.  If no improvements by next visit or trigger point injections, we may get imaging to further follow-up on this, however there were no concerning findings today.

## 2023-01-21 ENCOUNTER — Other Ambulatory Visit: Payer: Self-pay | Admitting: Oncology

## 2023-01-21 DIAGNOSIS — M4316 Spondylolisthesis, lumbar region: Secondary | ICD-10-CM | POA: Diagnosis not present

## 2023-01-21 DIAGNOSIS — M1612 Unilateral primary osteoarthritis, left hip: Secondary | ICD-10-CM | POA: Diagnosis not present

## 2023-01-21 DIAGNOSIS — I700 Atherosclerosis of aorta: Secondary | ICD-10-CM | POA: Diagnosis not present

## 2023-01-21 DIAGNOSIS — M545 Low back pain, unspecified: Secondary | ICD-10-CM | POA: Diagnosis not present

## 2023-01-21 DIAGNOSIS — G894 Chronic pain syndrome: Secondary | ICD-10-CM | POA: Diagnosis not present

## 2023-01-21 DIAGNOSIS — G8929 Other chronic pain: Secondary | ICD-10-CM | POA: Diagnosis not present

## 2023-01-21 DIAGNOSIS — R1032 Left lower quadrant pain: Secondary | ICD-10-CM | POA: Diagnosis not present

## 2023-01-21 DIAGNOSIS — M47816 Spondylosis without myelopathy or radiculopathy, lumbar region: Secondary | ICD-10-CM | POA: Diagnosis not present

## 2023-01-21 DIAGNOSIS — M2170 Unequal limb length (acquired), unspecified site: Secondary | ICD-10-CM | POA: Diagnosis not present

## 2023-01-21 DIAGNOSIS — M25552 Pain in left hip: Secondary | ICD-10-CM | POA: Diagnosis not present

## 2023-01-28 DIAGNOSIS — Z1331 Encounter for screening for depression: Secondary | ICD-10-CM | POA: Diagnosis not present

## 2023-01-28 DIAGNOSIS — Z713 Dietary counseling and surveillance: Secondary | ICD-10-CM | POA: Diagnosis not present

## 2023-01-28 DIAGNOSIS — Z6841 Body Mass Index (BMI) 40.0 and over, adult: Secondary | ICD-10-CM | POA: Diagnosis not present

## 2023-01-29 ENCOUNTER — Ambulatory Visit (INDEPENDENT_AMBULATORY_CARE_PROVIDER_SITE_OTHER): Payer: BC Managed Care – PPO | Admitting: Psychiatry

## 2023-01-29 DIAGNOSIS — F411 Generalized anxiety disorder: Secondary | ICD-10-CM | POA: Diagnosis not present
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
## 2023-02-04 ENCOUNTER — Ambulatory Visit (INDEPENDENT_AMBULATORY_CARE_PROVIDER_SITE_OTHER): Payer: BC Managed Care – PPO | Admitting: *Deleted

## 2023-02-04 DIAGNOSIS — J309 Allergic rhinitis, unspecified: Secondary | ICD-10-CM

## 2023-02-05 DIAGNOSIS — R519 Headache, unspecified: Secondary | ICD-10-CM | POA: Diagnosis not present

## 2023-02-05 DIAGNOSIS — Z20822 Contact with and (suspected) exposure to covid-19: Secondary | ICD-10-CM | POA: Diagnosis not present

## 2023-02-05 DIAGNOSIS — Z6841 Body Mass Index (BMI) 40.0 and over, adult: Secondary | ICD-10-CM | POA: Diagnosis not present

## 2023-02-05 DIAGNOSIS — H9203 Otalgia, bilateral: Secondary | ICD-10-CM | POA: Diagnosis not present

## 2023-02-07 DIAGNOSIS — R0981 Nasal congestion: Secondary | ICD-10-CM | POA: Diagnosis not present

## 2023-02-07 DIAGNOSIS — R059 Cough, unspecified: Secondary | ICD-10-CM | POA: Diagnosis not present

## 2023-02-07 DIAGNOSIS — J324 Chronic pansinusitis: Secondary | ICD-10-CM | POA: Diagnosis not present

## 2023-02-18 DIAGNOSIS — E1169 Type 2 diabetes mellitus with other specified complication: Secondary | ICD-10-CM | POA: Diagnosis not present

## 2023-02-18 DIAGNOSIS — Z6841 Body Mass Index (BMI) 40.0 and over, adult: Secondary | ICD-10-CM | POA: Diagnosis not present

## 2023-02-18 DIAGNOSIS — Z713 Dietary counseling and surveillance: Secondary | ICD-10-CM | POA: Diagnosis not present

## 2023-02-19 ENCOUNTER — Encounter
Payer: BLUE CROSS/BLUE SHIELD | Attending: Physical Medicine and Rehabilitation | Admitting: Physical Medicine and Rehabilitation

## 2023-02-19 ENCOUNTER — Encounter: Payer: Self-pay | Admitting: Physical Medicine and Rehabilitation

## 2023-02-19 VITALS — BP 124/78 | HR 94 | Ht 65.0 in | Wt 252.0 lb

## 2023-02-19 DIAGNOSIS — M7918 Myalgia, other site: Secondary | ICD-10-CM | POA: Diagnosis not present

## 2023-02-19 MED ORDER — TRIAMCINOLONE ACETONIDE 40 MG/ML IJ SUSP
0.4000 mg | Freq: Once | INTRAMUSCULAR | Status: AC
Start: 2023-02-19 — End: 2023-02-19
  Administered 2023-02-19: 0.4 mg via INTRAMUSCULAR

## 2023-02-19 MED ORDER — LIDOCAINE HCL 1 % IJ SOLN
3.0000 mL | Freq: Once | INTRAMUSCULAR | Status: AC
Start: 2023-02-19 — End: 2023-02-19
  Administered 2023-02-19: 3 mL

## 2023-02-19 NOTE — Progress Notes (Signed)
HPI: Laurie Simmons is a 57 y.o. 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 who presents to clinic for treatment of pain related to shoulder pain  via injection as described below.    No new concerns or complaints. No major changes in medical history since last visit. Did get low back and L hip xrays; results not received.   Physical Exam:  General: Appropriate appearance for age.  Mental Status: Appropriate mood and affect.  Cardiovascular: RRR, no m/r/g.  Respiratory: CTAB, no rales/rhonchi/wheezing.  Skin: No apparent rashes or lesions.  Neuro: Awake, alert, and oriented x3. No apparent deficits.  MSK Moving all 4 limbs antigravity and against resistance. + TTP Right  cervical paraspinals, levator scapulae, trapezius, and lumbar paraspinal muscles.   PROCEDURE:  Right  trigger point injections Diagnosis:    ICD-10-CM   1. Myofascial pain  M79.18 lidocaine (XYLOCAINE) 1 % (with pres) injection 3 mL    triamcinolone acetonide (KENALOG-40) injection 0.4 mg      Goals with treatment: [  x ] Decrease pain [ x ] Improve Active / Passive ROM [ x ] Improve ADLs [  ] Improve functional mobility  MEDICATION:  [ x ] Kenalog 40 mg/mL  [ X ] Lidocaine 1%    CONSENT: Obtained in writing per policy. Consent uploaded to chart.  Benefits discussed.  Risks discussed included, but were not limited to, pain and discomfort, bleeding, bruising, allergic reaction, infection. All questions answered to patient/family member/guardian/ caregiver satisfaction. They would like to proceed with procedure. There are no noted contraindications to procedure.  PROCEDURE Time out was preformed No heat sources No antibiotics  The patient was explained about both the benefits and risks of a Right  trigger point injections. After the patient acknowledged an understanding of the risks and benefits, the patient agreed to proceed. The area was first marked and then prepped in an aseptic fashion with betadine / alcohol. A 30 g, 1/2 inch needle was directed via a direct approach into the Right  cervical paraspinals, levator scapulae, trapezius, and lumbar paraspinal muscles. The injection was completed with Kenalog 40 mg/ml 0.2 cc mixed with 3 cc of 1% lidocaine after no blood was aspirated on pull back.  No complications were encountered. The patient tolerated the procedure well.  Impression: HPI: Laurie Simmons is a 57 y.o. 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); Acquired absence of left breast; Fatigue; Anxiety and depression; Abnormal stress test: Anterior wall ischemia cardiac cath after that showing 25% RCA no LAD  disease; Essential hypertension; Type 2 diabetes mellitus without  complication, without long-term current use of insulin (HCC); Dyslipidemia; Malignant neoplasm of female breast (HCC); PMB (postmenopausal bleeding); Coronary artery disease 25% RCA based on cardiac cath; Chronic pain syndrome; Midline low back pain without sciatica; Acquired leg length discrepancy; Left groin pain; and Acute pain of left shoulder on their problem list. who presents to clinic for treatment of chronic myofascial pain . They received a  Right  trigger point injections as above.   PLAN: - Resume Usual Activities. Notify Physician of any unusual bleeding, erythema or concern for side effects as reviewed above. - Apply ice prn for pain - Tylenol prn for pain - I will call you when I get results of your xrays to discuss further follow ups and referrals  Patient/Care [name] was ready to learn without apparent learning barriers. Education was provided on diagnosis, treatment options/plan according to patient's preferred learning style. Patient/Care Giver verbalized understanding and agreement with the above plan.   Angelina Sheriff, DO 02/19/2023

## 2023-02-19 NOTE — Patient Instructions (Signed)
-   Apply ice prn for pain - Tylenol prn for pain - I will call you when I get results of your xrays to discuss further follow ups and referrals

## 2023-02-26 ENCOUNTER — Ambulatory Visit: Payer: BC Managed Care – PPO | Admitting: Psychiatry

## 2023-02-26 DIAGNOSIS — M7918 Myalgia, other site: Secondary | ICD-10-CM | POA: Insufficient documentation

## 2023-03-03 ENCOUNTER — Encounter: Payer: Self-pay | Admitting: Physical Medicine and Rehabilitation

## 2023-03-05 ENCOUNTER — Ambulatory Visit (INDEPENDENT_AMBULATORY_CARE_PROVIDER_SITE_OTHER): Payer: BC Managed Care – PPO

## 2023-03-05 DIAGNOSIS — J309 Allergic rhinitis, unspecified: Secondary | ICD-10-CM | POA: Diagnosis not present

## 2023-03-05 DIAGNOSIS — R92321 Mammographic fibroglandular density, right breast: Secondary | ICD-10-CM | POA: Diagnosis not present

## 2023-03-05 DIAGNOSIS — R928 Other abnormal and inconclusive findings on diagnostic imaging of breast: Secondary | ICD-10-CM | POA: Diagnosis not present

## 2023-03-05 DIAGNOSIS — Z853 Personal history of malignant neoplasm of breast: Secondary | ICD-10-CM | POA: Diagnosis not present

## 2023-03-05 DIAGNOSIS — N6311 Unspecified lump in the right breast, upper outer quadrant: Secondary | ICD-10-CM | POA: Diagnosis not present

## 2023-03-18 DIAGNOSIS — Z713 Dietary counseling and surveillance: Secondary | ICD-10-CM | POA: Diagnosis not present

## 2023-03-18 DIAGNOSIS — Z6839 Body mass index (BMI) 39.0-39.9, adult: Secondary | ICD-10-CM | POA: Diagnosis not present

## 2023-03-21 ENCOUNTER — Encounter: Payer: Self-pay | Admitting: Physical Medicine & Rehabilitation

## 2023-03-21 ENCOUNTER — Encounter
Payer: BLUE CROSS/BLUE SHIELD | Attending: Physical Medicine & Rehabilitation | Admitting: Physical Medicine & Rehabilitation

## 2023-03-21 VITALS — BP 121/86 | HR 94 | Ht 65.0 in | Wt 246.0 lb

## 2023-03-21 DIAGNOSIS — M47816 Spondylosis without myelopathy or radiculopathy, lumbar region: Secondary | ICD-10-CM | POA: Insufficient documentation

## 2023-03-21 DIAGNOSIS — M1632 Unilateral osteoarthritis resulting from hip dysplasia, left hip: Secondary | ICD-10-CM | POA: Diagnosis not present

## 2023-03-21 NOTE — Progress Notes (Signed)
Subjective:    Patient ID: Laurie Simmons, female    DOB: Jul 15, 1966, 57 y.o.   MRN: [id number]  HPI 57 year old female referred by Dr. Shearon Stalls to evaluate for interventional pain procedures for chronic lumbar pain.  The patient also has chronic left hip pain but this is not as severe as the chronic lumbar pain. Back pain started post op after masectomy 3 yrs ago .  Had initial round of PT in 2021.  This was helpful Xrays performed at [facility] per MD  msg showing left hip OA , moderate Severe facet arthropathy L4-5 with anterolisthesis  Had PT this year just prior to first visit with Dr Shearon Stalls 01/15/23 without back pain improvment Hx of congenital  hip dislocation   The patient denies any sciatic pain in either leg. Pain is worse with standing then with sitting. Pain Inventory Average Pain 4 Pain Right Now 8 My pain is aching  In the last 24 hours, has pain interfered with the following? General activity 8 Relation with others 4 Enjoyment of life 6 What TIME of day is your pain at its worst? daytime Sleep (in general) Fair  Pain is worse with: walking, standing, and some activites Pain improves with: rest Relief from Meds:  no pain meds  Family History  Problem Relation Age of Onset   Kidney disease Mother    Pulmonary fibrosis Father    Cancer Maternal Grandmother        carcinoma of the vulva and possible ovarian cancer   Prostate cancer Maternal Grandfather    Lung cancer Paternal Grandfather        heavy smoker   Social History   Socioeconomic History   Marital status: Married    Spouse name: Phil   Number of children: 2   Years of education: Not on file   Highest education level: Not on file  Occupational History   Not on file  Tobacco Use   Smoking status: Never   Smokeless tobacco: Never  Vaping Use   Vaping status: Never Used  Substance and Sexual Activity   Alcohol use: No   Drug use: No   Sexual activity: Yes  Other Topics Concern   Not on  file  Social History Narrative   Not on file   Social Determinants of Health   Financial Resource Strain: Not on file  Food Insecurity: Not on file  Transportation Needs: Not on file  Physical Activity: Not on file  Stress: Not on file  Social Connections: Not on file   Past Surgical History:  Procedure Laterality Date   caesarean     CESAREAN SECTION     CHOLECYSTECTOMY     DILATION AND CURETTAGE OF UTERUS     HIP SURGERY Left    LEFT HEART CATH AND CORONARY ANGIOGRAPHY N/A 02/13/2022   Procedure: LEFT HEART CATH AND CORONARY ANGIOGRAPHY;  Surgeon: Corky Crafts, MD;  Location: [facility] INVASIVE CV LAB;  Service: Cardiovascular;  Laterality: N/A;   MASTECTOMY     PORT A CATH INJECTION ([facility] HX)  2021   Past Surgical History:  Procedure Laterality Date   caesarean     CESAREAN SECTION     CHOLECYSTECTOMY     DILATION AND CURETTAGE OF UTERUS     HIP SURGERY Left    LEFT HEART CATH AND CORONARY ANGIOGRAPHY N/A 02/13/2022   Procedure: LEFT HEART CATH AND CORONARY ANGIOGRAPHY;  Surgeon: Corky Crafts, MD;  Location: [facility] INVASIVE CV LAB;  Service:  Cardiovascular;  Laterality: N/A;   MASTECTOMY     PORT A CATH INJECTION ([facility] HX)  2021   Past Medical History:  Diagnosis Date   Abnormal stress test: Anterior wall ischemia cardiac cath after that showing 25% RCA no LAD disease 02/12/2022   Acquired absence of left breast 08/18/2020   Allergic rhinitis 03/10/2015   Anxiety    Anxiety and depression 08/30/2021   Arthritis    Asthma    Bilateral carpal tunnel syndrome 08/25/2018   BMI 40.0-44.9, adult (HCC) 08/23/2019   Breast cancer (HCC) 2021   Carpal tunnel syndrome    Carpal tunnel syndrome    bilateral   Chronic kidney disease    stage 3   Depression    Diabetes (HCC)    Dyslipidemia 03/27/2022   Essential hypertension 02/12/2022   Fatigue 08/30/2021   Fibroid 05/04/2019   Fibroids, intramural 08/09/2019   GERD (gastroesophageal reflux disease)     Hemodynamic instability 03/10/2015   Hypertension    Idiopathic urticaria 03/10/2015   Iron deficiency anemia due to chronic blood loss 08/09/2019   Irritable bowel syndrome    Malignant neoplasm of female breast (HCC) 04/17/2022   Moderate persistent asthma 03/10/2015   Obesity    Pain in both hands 07/16/2018   PMB (postmenopausal bleeding) 04/17/2022   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   Severe anemia 12/14/2015   Type 2 diabetes mellitus without complication, without long-term current use of insulin (HCC) 02/12/2022   BP 121/86   Pulse 94   Ht 5\' 5"  (1.651 m)   Wt 246 lb (111.6 kg)   LMP 01/23/2011   SpO2 98%   BMI 40.94 kg/m   Opioid Risk Score:   Fall Risk Score:  `1  Depression screen [facility] 2/9     02/19/2023   11:26 AM 01/15/2023   10:40 AM  Depression screen PHQ 2/9  Decreased Interest 0 1  Down, Depressed, Hopeless 0 1  PHQ - 2 Score 0 2  Altered sleeping  1  Tired, decreased energy  1  Change in appetite  0  Feeling bad or failure about yourself   0  Trouble concentrating  1  Moving slowly or fidgety/restless  0  Suicidal thoughts  0  PHQ-9 Score  5      Review of Systems  Musculoskeletal:  Positive for back pain.  All other systems reviewed and are negative.      Objective:   Physical Exam Vitals and nursing note reviewed.  Constitutional:      Appearance: She is obese.  HENT:     Head: Normocephalic and atraumatic.  Eyes:     Extraocular Movements: Extraocular movements intact.     Conjunctiva/sclera: Conjunctivae normal.     Pupils: Pupils are equal, round, and reactive to light.  Musculoskeletal:     Right lower leg: No edema.     Left lower leg: No edema.     Comments: Tenderness to palpation lumbar paraspinals starting at L4-S1 region. Lumbar range of motion 100% lumbar flexion without pain lumbar extension is 50% accompanied by pain.  With lateral bending she has some groin pain on the left side but  none in the back There is decreased internal rotation at the left hip accompanied by pain external rotation is mildly diminished accompanied by pain.  Skin:    General: Skin is warm and dry.  Neurological:     Mental Status: She is oriented to person, place, and time.  Comments: Motor strength is 5/5 bilateral hip flexor knee extensor ankle dorsiflexor Negative straight leg raising bilaterally Ambulates without assistive device no evidence of toe drag or knee instability Sensation to light touch is normal bilateral lower extremities  Psychiatric:        Mood and Affect: Mood normal.        Behavior: Behavior normal.           Assessment & Plan:   1.  Chronic low back pain of approximately 3-year duration.  X-ray findings consistent with lumbar spondylosis.  Her exam is concordant i.e. pain and limitation with extension with relief and full range of motion with lumbar flexion. We discussed treatment options.  We discussed lumbar medial branch blocks which would be for diagnostic purposes and be targeted at the L3 and L4 medial branches bilaterally as well as L5 dorsal ramus.  These would be done under fluoroscopic guidance and if there is a temporary improvement of her typical lower lumbar pain a repeat set of blocks would need to be performed and if these produced a significant improvement as well proceed to radiofrequency neurotomy of the same nerves. We discussed alternate treatments including another round of physical therapy targeting more of a flexion bias. The patient will call to indicate which direction she would like to go.  Erick Colace M.D. [facility] Fellow [employer] Diplomate Am Board of Electrodiagnostic Med Fellow Am Board of Interventional Pain

## 2023-03-21 NOTE — Patient Instructions (Addendum)
Medial branch blocks- diagnostic Lumbar Medial branch radiofrequency neurotomy   Please use mychart to indicate whether you'd prefer another round of PT vs Medial branch blocks

## 2023-03-24 ENCOUNTER — Encounter: Payer: Self-pay | Admitting: Physical Medicine & Rehabilitation

## 2023-03-26 ENCOUNTER — Ambulatory Visit: Payer: BC Managed Care – PPO | Admitting: Psychiatry

## 2023-03-27 DIAGNOSIS — N6011 Diffuse cystic mastopathy of right breast: Secondary | ICD-10-CM | POA: Diagnosis not present

## 2023-03-27 DIAGNOSIS — R928 Other abnormal and inconclusive findings on diagnostic imaging of breast: Secondary | ICD-10-CM | POA: Diagnosis not present

## 2023-03-27 DIAGNOSIS — N6311 Unspecified lump in the right breast, upper outer quadrant: Secondary | ICD-10-CM | POA: Diagnosis not present

## 2023-03-28 ENCOUNTER — Inpatient Hospital Stay: Payer: BLUE CROSS/BLUE SHIELD

## 2023-03-28 ENCOUNTER — Encounter: Payer: Self-pay | Admitting: Oncology

## 2023-03-28 ENCOUNTER — Inpatient Hospital Stay: Payer: BLUE CROSS/BLUE SHIELD | Attending: Oncology | Admitting: Oncology

## 2023-03-28 VITALS — BP 131/90 | HR 88 | Temp 98.7°F | Resp 18 | Ht 65.0 in | Wt 243.2 lb

## 2023-03-28 DIAGNOSIS — C50912 Malignant neoplasm of unspecified site of left female breast: Secondary | ICD-10-CM | POA: Insufficient documentation

## 2023-03-28 DIAGNOSIS — Z170 Estrogen receptor positive status [ER+]: Secondary | ICD-10-CM | POA: Diagnosis not present

## 2023-03-28 DIAGNOSIS — C773 Secondary and unspecified malignant neoplasm of axilla and upper limb lymph nodes: Secondary | ICD-10-CM | POA: Diagnosis not present

## 2023-03-28 LAB — CMP (CANCER CENTER ONLY)
ALT: 19 U/L (ref 0–44)
AST: 17 U/L (ref 15–41)
Albumin: 4.4 g/dL (ref 3.5–5.0)
Alkaline Phosphatase: 75 U/L (ref 38–126)
Anion gap: 12 (ref 5–15)
BUN: 16 mg/dL (ref 6–20)
CO2: 26 mmol/L (ref 22–32)
Calcium: 9.6 mg/dL (ref 8.9–10.3)
Chloride: 97 mmol/L — ABNORMAL LOW (ref 98–111)
Creatinine: 1.41 mg/dL — ABNORMAL HIGH (ref 0.44–1.00)
GFR, Estimated: 44 mL/min — ABNORMAL LOW (ref >60)
Glucose, Bld: 96 mg/dL (ref 70–99)
Potassium: 2.9 mmol/L — ABNORMAL LOW (ref 3.5–5.1)
Sodium: 135 mmol/L (ref 135–145)
Total Bilirubin: 0.6 mg/dL (ref 0.3–1.2)
Total Protein: 8.2 g/dL — ABNORMAL HIGH (ref 6.5–8.1)

## 2023-03-28 LAB — CBC WITH DIFFERENTIAL (CANCER CENTER ONLY)
Abs Immature Granulocytes: 0.03 10*3/uL (ref 0.00–0.07)
Basophils Absolute: 0.1 10*3/uL (ref 0.0–0.1)
Basophils Relative: 1 %
Eosinophils Absolute: 0.3 10*3/uL (ref 0.0–0.5)
Eosinophils Relative: 3 %
HCT: 45.2 % (ref 36.0–46.0)
Hemoglobin: 14.8 g/dL (ref 12.0–15.0)
Immature Granulocytes: 0 %
Lymphocytes Relative: 27 %
Lymphs Abs: 2.5 10*3/uL (ref 0.7–4.0)
MCH: 27.5 pg (ref 26.0–34.0)
MCHC: 32.7 g/dL (ref 30.0–36.0)
MCV: 83.9 fL (ref 80.0–100.0)
Monocytes Absolute: 0.7 10*3/uL (ref 0.1–1.0)
Monocytes Relative: 7 %
Neutro Abs: 5.7 10*3/uL (ref 1.7–7.7)
Neutrophils Relative %: 62 %
Platelet Count: 349 10*3/uL (ref 150–400)
RBC: 5.39 MIL/uL — ABNORMAL HIGH (ref 3.87–5.11)
RDW: 14 % (ref 11.5–15.5)
WBC Count: 9.3 10*3/uL (ref 4.0–10.5)
nRBC: 0 % (ref 0.0–0.2)

## 2023-03-28 NOTE — Progress Notes (Signed)
Patient Care Team: Abner Greenspan, MD as PCP - General (Family Medicine) Raelene Bott, MD as Obstetrician (Obstetrics and Gynecology) Alesia Morin., MD as Referring Physician (Surgery) Dellia Beckwith, MD as Consulting Physician (Oncology) Lance Bosch, MD as Consulting Physician (Radiation Oncology)  Clinic Day: 03/28/2023  Referring physician: Abner Greenspan, MD  ASSESSMENT & PLAN:  Assessment & Plan: Secondary malignant neoplasm of axillary lymph nodes [facility]) Clinical stage IIB (TXN2M0), grade 3, ductal carcinoma with no breast primary, diagnosed in February 2021.  She received neoadjuvant chemotherapy with docetaxel/doxorubicin/cyclophosphamide completed in July.  She underwent left mastectomy and axillary dissection, with no breast primary found and 9/26 lymph nodes positive for metastasis. She completed adjuvant radiation at the end of November, and was placed on hormonal therapy with tamoxifen at the end of December. Baseline bone density scan was completely normal. She was unable to tolerate tamoxifen and was switched to anastrazole. She is tolerating this better with minimal side effects, mainly muscle and joint pain and fatigue.  Osteopenia She had a bone density scan done on 10/03/2022 that revealed a normal spine and osteopenia of the left femur with a stable T-score of -1.8.  Plan: She continues Anastrozole without difficulty, we discussed the fact that she should consider continuing this beyond 5 years, preferably 10 years due to her stage IIB disease. She had a bone density scan done on 10/03/2022 that revealed a normal spine and osteopenia of the left femur with a stable T-score of -1.8 She had a diagnostic unilateral right mammogram done on 03/05/2023 that revealed an indeterminate mass in the 11:30 o'clock location of the right breast. She then had a biopsy on 03/27/2023 that revealed PASH and fibrocystic changes, with no atypia or invasive malignancy. She will  see her surgeon in 2 weeks. Her labs today are pending. I will see her back in 6 months, if all is well, we will go to yearly follow-up.  The patient understands the plans discussed today and is in agreement with them.  She knows to contact our office if she develops concerns prior to her next appointment.   I provided 10 minutes of face-to-face time during this this encounter and > 50% was spent counseling as documented under my assessment and plan.   Dellia Beckwith, MD  [facility] [address] [place] [address] Dept: [phone] Dept Fax: [phone]   No orders of the defined types were placed in this encounter.     CHIEF COMPLAINT:  CC: history of breast cancer  Current Treatment:  Anastrazole  INTERVAL HISTORY:  Laurie Simmons is here today for repeat clinical assessment for her history of breast cancer. Patient states that she feels well but complains of back pain rating 4/10. She continues Anastrozole without difficulty, we discussed the fact that she should consider continuing this beyond 5 years, preferably 10,years due to her stage IIB disease. She had a bone density scan done on 10/03/2022 that revealed a normal spine and osteopenia of the left femur with a stable T-score of -1.8 She had a diagnostic unilateral right mammogram done on 03/05/2023 that revealed an indeterminate mass in the 11:30 o'clock location of the right breast. She then had a biopsy on 03/27/2023 that revealed PASH fibrocystic changes, with no aytypia or invasive malignancy. She will see her surgeon in 2 weeks. Her labs today are pending. I will see her back in 6 months, if all is well we will go to  yearly follow-up.  She denies signs of infection such as sore throat, sinus drainage, cough, or urinary symptoms.  She denies fevers or recurrent chills. She denies pain. She denies nausea, vomiting, chest pain, dyspnea or cough. Her appetite is good  and her weight has decreased 9 pounds over last month . She is accompanied at today's visit with her husband.     I have reviewed the past medical history, past surgical history, social history and family history with the patient and they are unchanged from previous note.  ALLERGIES:  is allergic to effexor [venlafaxine] and nsaids.  MEDICATIONS:  Current Outpatient Medications  Medication Sig Dispense Refill   AIRSUPRA 90-80 MCG/ACT AERO Inhale 2 puffs into the lungs as needed (every 4 to 6 hours for cough, wheeze, shortness of breath.  Rinse, mouth after use). 10.7 g 1   albuterol (VENTOLIN HFA) 108 (90 Base) MCG/ACT inhaler Inhale 2 puffs into the lungs every 4 (four) hours as needed for wheezing or shortness of breath. 18 g 1   anastrozole (ARIMIDEX) 1 MG tablet TAKE 1 TABLET(1 MG) BY MOUTH DAILY 90 tablet 3   ARIPiprazole (ABILIFY) 2 MG tablet Take 1 tablet (2 mg total) by mouth daily. 90 tablet 3   aspirin EC 81 MG tablet Take 1 tablet (81 mg total) by mouth daily. Swallow whole. 90 tablet 3   Azelastine HCl 0.15 % SOLN Can use one spray in each nostril two times daily if needed. (Patient taking differently: Place 1 spray into both nostrils 2 (two) times daily as needed (allergies). Can use one spray in each nostril two times daily if needed.) 90 mL 1   Biotin 62130 MCG TBDP Take 10,000 mcg by mouth daily.     calcium carbonate (TUMS EX) 750 MG chewable tablet Chew 1,500 mg by mouth daily as needed for heartburn.     clorazepate (TRANXENE) 3.75 MG tablet Take one tablet daily as needed for anxiety/panic attacks. 30 tablet 2   Cyanocobalamin (B-12) 5000 MCG CAPS Take 5,000 mcg by mouth daily.     EPINEPHrine (EPIPEN 2-PAK) 0.3 mg/0.3 mL IJ SOAJ injection Use as directed for life-threatening allergic reaction. (Patient taking differently: Inject 0.3 mg into the muscle as needed for anaphylaxis. Use as directed for life-threatening allergic reaction.) 1 each 3   EPINEPHrine (EPIPEN 2-PAK)  0.3 mg/0.3 mL IJ SOAJ injection Use as directed for life-threatening allergic reaction. 2 each 3   famotidine (PEPCID) 40 MG tablet TAKE 1 TABLET BY MOUTH TWICE DAILY AS DIRECTED 60 tablet 5   FARXIGA 10 MG TABS tablet Take 10 mg by mouth daily.     fluticasone (FLONASE) 50 MCG/ACT nasal spray Place 2 sprays into both nostrils daily as needed for allergies or rhinitis.     folic acid (FOLVITE) 1 MG tablet TAKE 1 TABLET BY MOUTH DAILY 30 tablet 5   gabapentin (NEURONTIN) 300 MG capsule Take 1 capsule (300 mg total) by mouth 2 (two) times daily. (Patient taking differently: Take 300 mg by mouth at bedtime.) 60 capsule 3   loratadine (CLARITIN) 10 MG tablet TAKE 1 TABLET BY MOUTH ONCE DAILY AS NEEDED 90 tablet 1   MOUNJARO 5 MG/0.5ML Pen Inject 5 mg into the skin once a week.     MOUNJARO 7.5 MG/0.5ML Pen Inject into the skin.     ondansetron (ZOFRAN) 4 MG tablet TAKE 1 TABLET BY MOUTH EVERY 4 HOURS AS NEEDED FOR NAUSEA (Patient taking differently: Take 4 mg by mouth every 4 (four)  hours as needed for nausea or vomiting.) 30 tablet 0   potassium chloride (KLOR-CON) 10 MEQ tablet TAKE 1 TABLET BY MOUTH TWICE DAILY (Patient taking differently: Take 10 mEq by mouth daily.) 60 tablet 1   prochlorperazine (COMPAZINE) 10 MG tablet TAKE 1 TABLET BY MOUTH EVERY 6 HOURS AS NEEDED FOR NAUSEA (Patient taking differently: Take 10 mg by mouth every 6 (six) hours as needed for nausea or vomiting.) 90 tablet 1   rosuvastatin (CRESTOR) 20 MG tablet Take 1 tablet (20 mg total) by mouth daily. 90 tablet 3   senna-docusate (SENOKOT-S) 8.6-50 MG tablet Take 2 tablets by mouth daily.     topiramate (TOPAMAX) 50 MG tablet Take 1 tablet (50 mg total) by mouth daily. 90 tablet 3   UNABLE TO FIND Inject 1 each as directed See admin instructions. Allergy injections for cats, grass, and dust.     valsartan-hydrochlorothiazide (DIOVAN-HCT) 320-25 MG tablet Take 1 tablet by mouth daily.  0   Vilazodone HCl (VIIBRYD) 40 MG TABS  Take 1 tablet (40 mg total) by mouth daily. TAKE 1 TABLET(40 MG) BY MOUTH DAILY 90 tablet 3   WIXELA INHUB 250-50 MCG/ACT AEPB INHALE 1 PUFF BY MOUTH TWICE DAILY TO PREVENT FOR COUGH OR WHEEZE. RINSE MOUTH AFTER USE. 60 each 5   No current facility-administered medications for this visit.    HISTORY OF PRESENT ILLNESS:   Oncology History  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)  08/16/2019 Cancer Staging   Staging form: Breast, AJCC 8th Edition - Clinical stage from 08/16/2019: Stage IIIA (cT0, cN2(f), cM0, G3, ER+, PR+, HER2-) - Signed by Dellia Beckwith, MD on 06/28/2020 Staging comments: TAC x 6 as neoadjuvant chemo   08/23/2019 Initial Diagnosis   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)   03/15/2020 Cancer Staging   Staging form: Breast, AJCC 8th Edition - Pathologic stage from 03/15/2020: No Stage Recommended (ypT0, pN2a, cM0, G3, ER+, PR+, HER2-) - Signed by Dellia Beckwith, MD on 06/28/2020 Staging comments: Mastectomy with no primary found, postop radiation, then hormonal Rx     REVIEW OF SYSTEMS:  Review of Systems  Constitutional:  Negative for appetite change, chills, diaphoresis, fatigue, fever and unexpected weight change.  HENT:  Negative.  Negative for hearing loss, lump/mass, mouth sores, nosebleeds, sore throat, tinnitus, trouble swallowing and voice change.   Eyes: Negative.  Negative for eye problems and icterus.  Respiratory: Negative.  Negative for chest tightness, cough, hemoptysis, shortness of breath and wheezing.   Cardiovascular: Negative.  Negative for chest pain, leg swelling and palpitations.  Gastrointestinal:  Negative for abdominal distention, abdominal pain, blood in stool, constipation, diarrhea, nausea, rectal pain and vomiting.  Endocrine: Negative.   Genitourinary:  Negative for bladder incontinence, difficulty urinating, dyspareunia, dysuria, frequency, hematuria, menstrual problem, nocturia, pelvic pain,  vaginal bleeding and vaginal discharge.   Musculoskeletal:  Positive for back pain (4/10). Negative for arthralgias, flank pain, gait problem, myalgias, neck pain and neck stiffness.  Skin: Negative.  Negative for itching, rash and wound.  Neurological:  Negative for dizziness, extremity weakness, gait problem, headaches, light-headedness, numbness, seizures and speech difficulty.  Hematological: Negative.  Negative for adenopathy. Does not bruise/bleed easily.  Psychiatric/Behavioral: Negative.  Negative for confusion, decreased concentration, depression, sleep disturbance and suicidal ideas. The patient is not nervous/anxious.    VITALS:  Blood pressure (!) 131/90, pulse 88, temperature 98.7 F (37.1 C), temperature source Oral, resp. rate 18, height 5\' 5"  (1.651 m), weight 243  lb 3.2 oz (110.3 kg), last menstrual period 01/23/2011, SpO2 98%.  Wt Readings from Last 3 Encounters:  03/28/23 243 lb 3.2 oz (110.3 kg)  03/21/23 246 lb (111.6 kg)  02/19/23 252 lb (114.3 kg)    Body mass index is 40.47 kg/m.  Performance status (ECOG): 1 - Symptomatic but completely ambulatory  PHYSICAL EXAM:  Physical Exam Vitals and nursing note reviewed.  Constitutional:      General: She is not in acute distress.    Appearance: Normal appearance. She is normal weight. She is not ill-appearing, toxic-appearing or diaphoretic.  HENT:     Head: Normocephalic and atraumatic.     Right Ear: Tympanic membrane, ear canal and external ear normal. There is no impacted cerumen.     Left Ear: Tympanic membrane, ear canal and external ear normal. There is no impacted cerumen.     Nose: Nose normal. No congestion or rhinorrhea.     Mouth/Throat:     Mouth: Mucous membranes are moist.     Pharynx: Oropharynx is clear. No oropharyngeal exudate or posterior oropharyngeal erythema.  Eyes:     General: No scleral icterus.       Right eye: No discharge.        Left eye: No discharge.     Extraocular Movements:  Extraocular movements intact.     Conjunctiva/sclera: Conjunctivae normal.     Pupils: Pupils are equal, round, and reactive to light.  Neck:     Vascular: No carotid bruit.  Cardiovascular:     Rate and Rhythm: Normal rate and regular rhythm.     Pulses: Normal pulses.     Heart sounds: Normal heart sounds. No murmur heard.    No friction rub. No gallop.  Pulmonary:     Effort: Pulmonary effort is normal. No respiratory distress.     Breath sounds: Normal breath sounds. No stridor. No wheezing, rhonchi or rales.  Chest:     Chest wall: No tenderness.     Comments: Left mastectomy is nodular but benign  Right breast is without masses but she has a slight firmness in the upper outer quadrant at the sight of the recent biopsy Abdominal:     General: Bowel sounds are normal. There is no distension.     Palpations: Abdomen is soft. There is no hepatomegaly, splenomegaly or mass.     Tenderness: There is no abdominal tenderness. There is no right CVA tenderness, left CVA tenderness, guarding or rebound.     Hernia: No hernia is present.  Musculoskeletal:        General: No swelling, tenderness, deformity or signs of injury. Normal range of motion.     Cervical back: Normal range of motion and neck supple. No rigidity or tenderness.     Right lower leg: No edema.     Left lower leg: No edema.  Lymphadenopathy:     Cervical: No cervical adenopathy.     Right cervical: No superficial, deep or posterior cervical adenopathy.    Left cervical: No superficial, deep or posterior cervical adenopathy.     Upper Body:     Right upper body: No supraclavicular, axillary or pectoral adenopathy.     Left upper body: No supraclavicular, axillary or pectoral adenopathy.  Skin:    General: Skin is warm and dry.     Coloration: Skin is not jaundiced or pale.     Findings: No bruising, erythema, lesion or rash.  Neurological:     General: No focal  deficit present.     Mental Status: She is alert and  oriented to person, place, and time. Mental status is at baseline.     Cranial Nerves: No cranial nerve deficit.     Sensory: No sensory deficit.     Motor: No weakness.     Coordination: Coordination normal.     Gait: Gait normal.     Deep Tendon Reflexes: Reflexes normal.  Psychiatric:        Mood and Affect: Mood normal.        Behavior: Behavior normal.        Thought Content: Thought content normal.        Judgment: Judgment normal.      LABORATORY DATA:  I have reviewed the data as listed     Component Value Date/Time   NA 135 03/28/2023 1554   NA 137 02/12/2022 1035   K 2.9 (L) 03/28/2023 1554   CL 97 (L) 03/28/2023 1554   CO2 26 03/28/2023 1554   GLUCOSE 96 03/28/2023 1554   BUN 16 03/28/2023 1554   BUN 16 02/12/2022 1035   CREATININE 1.41 (H) 03/28/2023 1554   CALCIUM 9.6 03/28/2023 1554   PROT 8.2 (H) 03/28/2023 1554   ALBUMIN 4.4 03/28/2023 1554   AST 17 03/28/2023 1554   ALT 19 03/28/2023 1554   ALKPHOS 75 03/28/2023 1554   BILITOT 0.6 03/28/2023 1554   GFRNONAA 44 (L) 03/28/2023 1554   GFRAA  10/20/2008 2105    >60        The eGFR has been calculated using the MDRD equation. This calculation has not been validated in all clinical situations. eGFR's persistently <60 mL/min signify possible Chronic Kidney Disease.    No results found for: "SPEP", "UPEP"  Lab Results  Component Value Date   WBC 9.3 03/28/2023   NEUTROABS 5.7 03/28/2023   HGB 14.8 03/28/2023   HCT 45.2 03/28/2023   MCV 83.9 03/28/2023   PLT 349 03/28/2023      Chemistry      Component Value Date/Time   NA 135 03/28/2023 1554   NA 137 02/12/2022 1035   K 2.9 (L) 03/28/2023 1554   CL 97 (L) 03/28/2023 1554   CO2 26 03/28/2023 1554   BUN 16 03/28/2023 1554   BUN 16 02/12/2022 1035   CREATININE 1.41 (H) 03/28/2023 1554   GLU 97 01/23/2022 0000      Component Value Date/Time   CALCIUM 9.6 03/28/2023 1554   ALKPHOS 75 03/28/2023 1554   AST 17 03/28/2023 1554   ALT 19  03/28/2023 1554   BILITOT 0.6 03/28/2023 1554       RADIOGRAPHIC STUDIES: I have personally reviewed the radiological images as listed and agreed with the findings in the report. Pathology: 03/27/2023 Fibrocystic changes Psuedoangiomatous Stromal Hyperplasia (PASH) No atypia or invasive malignancy identified  Exam: 03/05/2023 Digital Diagnostic Unilateral Right Mammogram with Tomosynthesis and CAD; Ultrasound Right Breast Impression: Indeterminate mass in the 11:30 o'clock location of the RIGHT breast, a possible correlate for the asymmetry seen mammographically which appears stable.      I,Jasmine M Lassiter,acting as a scribe for Dellia Beckwith, MD.,have documented all relevant documentation on the behalf of Dellia Beckwith, MD,as directed by  Dellia Beckwith, MD while in the presence of Dellia Beckwith, MD.

## 2023-03-31 ENCOUNTER — Other Ambulatory Visit: Payer: Self-pay | Admitting: Allergy and Immunology

## 2023-04-02 ENCOUNTER — Ambulatory Visit (INDEPENDENT_AMBULATORY_CARE_PROVIDER_SITE_OTHER): Payer: BC Managed Care – PPO | Admitting: *Deleted

## 2023-04-02 DIAGNOSIS — J309 Allergic rhinitis, unspecified: Secondary | ICD-10-CM

## 2023-04-02 DIAGNOSIS — E785 Hyperlipidemia, unspecified: Secondary | ICD-10-CM | POA: Diagnosis not present

## 2023-04-02 DIAGNOSIS — I10 Essential (primary) hypertension: Secondary | ICD-10-CM | POA: Diagnosis not present

## 2023-04-02 DIAGNOSIS — E1169 Type 2 diabetes mellitus with other specified complication: Secondary | ICD-10-CM | POA: Diagnosis not present

## 2023-04-03 ENCOUNTER — Ambulatory Visit: Payer: BLUE CROSS/BLUE SHIELD | Admitting: Psychiatry

## 2023-04-03 DIAGNOSIS — F411 Generalized anxiety disorder: Secondary | ICD-10-CM | POA: Diagnosis not present

## 2023-04-03 NOTE — Progress Notes (Signed)
[facility] Counselor/Therapist Progress Note  Patient ID: Laurie Simmons, MRN: [id number],    Date: 04/03/2023  Time Spent: 55 minutes   Treatment Type: Individual Therapy  Reported Symptoms:  anxiety, depression and lack of motivation   Mental Status Exam:  Appearance:   Casual     Behavior:  Appropriate and Sharing  Motor:  Normal  Speech/Language:   Clear and Coherent  Affect:  Anxious, some depression  Mood:  anxious and depressed  Thought process:  goal directed  Thought content:    Some obsessive thoughts  Sensory/Perceptual disturbances:    WNL  Orientation:  oriented to person, place, time/date, situation, day of week, month of year, year, and stated date of Sept. 19, 2024  Attention:  Fair  Concentration:  Fair  Memory:  WNL  Fund of knowledge:   Good and Fair  Insight:    Good and Fair  Judgment:   Good  Impulse Control:  Good   Risk Assessment: Danger to Self:  No Self-injurious Behavior: No Danger to Others: No Duty to Warn:no Physical Aggression / Violence:No  Access to Firearms a concern: No  Gang Involvement:No   Subjective:   Patient in for session today and reporting anxiety and depression, and lacking motivation. Stated that it helps her at night pray before going to sleep and discussed being able to use that strategy with a couple additional steps, in the mornings to help her get up and going for the day. Son recovering from his foot surgery. Anxious about her health, family, and "other personal stressors". Is having injection in hip Oct. 18 and if that is helpful, she'll get another injection "later to burn the nerve." Hard not to take a morning nap but then gets back up after that. Trying to notice "the positives" also. Needed part of session today to discuss current issue with daughter and her S.O. (who is 10 yrs older). Patient has strong feelings about certain choices being made and trying to manage her own feelings about the situation. (Not all  details included in this note due to patient privacy needs.)  Interventions: Cognitive Behavioral Therapy and Ego-Supportive  Long-term goal: Develop healthy cognitive patterns and beliefs about self and the world that lead to alleviation and help prevent the relapse of depressive symptoms Short-term goal: Replace negative self-defeating self-talk with verbalization of realistic and positive cognitive messages. Strategies: Reinforce positive, reality-based cognitive messages that enhance self-confidence and increase adaptive action.  Diagnosis:   ICD-10-CM   1. Generalized anxiety disorder  F41.1      Plan:  Patient in for session today working further on her symptoms of depression and anxiety.  Had reported last session difficulty following through on strategies discussed in sessions and we discussed ways of trying to improve her motivation and also the importance of her focusing on positives in addition to "need areas".  Is making progress and needs to continue her work with goal-directed behaviors to move in positive direction. Encouraged patient in her practice of more positive and self affirming behaviors as noted in sessions including: Refrain from assuming things are always going to go in a negative direction, remain in touch with people who are supportive of her, stay in the present focused on what she can change or control versus cannot, get outside some each day, stay on her prescribed medication, healthy nutrition and exercise, use of positive self talk, challenge and counteract self-doubt, reduce her overthinking and over analyzing, and recognize the strength  she can show when working with goal-directed behaviors to move in a direction that supports her improved emotional health and outlook.   Goal review and progress/challenges noted with patient.  Next appointment within 4 weeks.   Mathis Fare, LCSW

## 2023-04-07 DIAGNOSIS — C50112 Malignant neoplasm of central portion of left female breast: Secondary | ICD-10-CM | POA: Diagnosis not present

## 2023-04-07 DIAGNOSIS — R928 Other abnormal and inconclusive findings on diagnostic imaging of breast: Secondary | ICD-10-CM | POA: Insufficient documentation

## 2023-04-07 DIAGNOSIS — Z170 Estrogen receptor positive status [ER+]: Secondary | ICD-10-CM | POA: Diagnosis not present

## 2023-04-07 DIAGNOSIS — C773 Secondary and unspecified malignant neoplasm of axilla and upper limb lymph nodes: Secondary | ICD-10-CM | POA: Diagnosis not present

## 2023-04-08 ENCOUNTER — Other Ambulatory Visit: Payer: Self-pay | Admitting: Allergy and Immunology

## 2023-04-08 DIAGNOSIS — I10 Essential (primary) hypertension: Secondary | ICD-10-CM | POA: Diagnosis not present

## 2023-04-08 DIAGNOSIS — Z23 Encounter for immunization: Secondary | ICD-10-CM | POA: Diagnosis not present

## 2023-04-08 DIAGNOSIS — Z6839 Body mass index (BMI) 39.0-39.9, adult: Secondary | ICD-10-CM | POA: Diagnosis not present

## 2023-04-08 DIAGNOSIS — N1832 Chronic kidney disease, stage 3b: Secondary | ICD-10-CM | POA: Diagnosis not present

## 2023-04-08 DIAGNOSIS — E785 Hyperlipidemia, unspecified: Secondary | ICD-10-CM | POA: Diagnosis not present

## 2023-04-08 DIAGNOSIS — E1169 Type 2 diabetes mellitus with other specified complication: Secondary | ICD-10-CM | POA: Diagnosis not present

## 2023-04-14 DIAGNOSIS — M7661 Achilles tendinitis, right leg: Secondary | ICD-10-CM | POA: Diagnosis not present

## 2023-04-14 DIAGNOSIS — R531 Weakness: Secondary | ICD-10-CM | POA: Diagnosis not present

## 2023-04-15 DIAGNOSIS — Z6838 Body mass index (BMI) 38.0-38.9, adult: Secondary | ICD-10-CM | POA: Diagnosis not present

## 2023-04-15 DIAGNOSIS — Z713 Dietary counseling and surveillance: Secondary | ICD-10-CM | POA: Diagnosis not present

## 2023-04-21 ENCOUNTER — Ambulatory Visit: Payer: BC Managed Care – PPO | Admitting: Adult Health

## 2023-04-21 DIAGNOSIS — J3081 Allergic rhinitis due to animal (cat) (dog) hair and dander: Secondary | ICD-10-CM | POA: Diagnosis not present

## 2023-04-21 NOTE — Progress Notes (Signed)
VIALS EXP 04-20-24

## 2023-04-23 ENCOUNTER — Telehealth (INDEPENDENT_AMBULATORY_CARE_PROVIDER_SITE_OTHER): Payer: BLUE CROSS/BLUE SHIELD | Admitting: Adult Health

## 2023-04-23 DIAGNOSIS — F331 Major depressive disorder, recurrent, moderate: Secondary | ICD-10-CM

## 2023-04-23 DIAGNOSIS — G4700 Insomnia, unspecified: Secondary | ICD-10-CM

## 2023-04-23 DIAGNOSIS — F32A Depression, unspecified: Secondary | ICD-10-CM

## 2023-04-23 DIAGNOSIS — F410 Panic disorder [episodic paroxysmal anxiety] without agoraphobia: Secondary | ICD-10-CM | POA: Diagnosis not present

## 2023-04-23 DIAGNOSIS — F419 Anxiety disorder, unspecified: Secondary | ICD-10-CM | POA: Diagnosis not present

## 2023-04-23 DIAGNOSIS — F411 Generalized anxiety disorder: Secondary | ICD-10-CM

## 2023-04-24 DIAGNOSIS — R531 Weakness: Secondary | ICD-10-CM | POA: Diagnosis not present

## 2023-04-24 DIAGNOSIS — M7661 Achilles tendinitis, right leg: Secondary | ICD-10-CM | POA: Diagnosis not present

## 2023-04-28 ENCOUNTER — Ambulatory Visit (INDEPENDENT_AMBULATORY_CARE_PROVIDER_SITE_OTHER): Payer: BLUE CROSS/BLUE SHIELD | Admitting: Psychiatry

## 2023-04-28 ENCOUNTER — Encounter: Payer: Self-pay | Admitting: Adult Health

## 2023-04-28 DIAGNOSIS — F411 Generalized anxiety disorder: Secondary | ICD-10-CM

## 2023-04-28 NOTE — Progress Notes (Signed)
[facility] Counselor/Therapist Progress Note  Patient ID: Laurie Simmons, MRN: [id number],    Date: 04/28/2023  Time Spent: 50 minutes   Treatment Type: Individual Therapy  Reported Symptoms:  anxiety, depression   Mental Status Exam:  Appearance:   Casual and Neat     Behavior:  Appropriate and Sharing  Motor:  Normal  Speech/Language:   Clear and Coherent  Affect:  Depressed and anxious  Mood:  anxious and depressed  Thought process:  goal directed  Thought content:    WNL  Sensory/Perceptual disturbances:    WNL  Orientation:  oriented to person, place, time/date, situation, day of week, month of year, year, and stated date of Oct. 14, 2024  Attention:  Fair  Concentration:  Good and Fair  Memory:  WNL  Fund of knowledge:   Good  Insight:    Good  Judgment:   Good  Impulse Control:  Good   Risk Assessment: Danger to Self:  No Self-injurious Behavior: No Danger to Others: No Duty to Warn:no Physical Aggression / Violence:No  Access to Firearms a concern: No  Gang Involvement:No   Subjective: Patient today reporting anxiety "over little things", driving in traffic makes me nervous, hard to be motivated. 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 Difficulty "getting up and doing stuff." "I need to work in my house but also due to motivation and my physical issues, I can't do what all I want to do as quickly." Looked at better ways to motivate herself . Reports it helps her to pray for herself a couple times during the day. Anxious about her health and worked today on more realistic and positive ways of coaching herself through anxious times/situations. To get back injection Oct. 18 and another time and if they are helpful, she states they'll do another procedure to burn the nerve to alleviate pain.  Reminded to try and focus more on the positives versus negatives or fears, intentionally trying to see more positives and create more positives for herself.  Interventions: Cognitive Behavioral Therapy and Ego-Supportive  Long-term goal: Develop healthy cognitive patterns and beliefs about self and the world that lead to alleviation and help prevent the relapse of depressive symptoms Short-term goal: Replace negative self-defeating self-talk with verbalization of realistic and positive cognitive messages. Strategies: Reinforce positive, reality-based cognitive messages that enhance self-confidence and increase adaptive action.   Diagnosis:   ICD-10-CM   1. Generalized anxiety disorder  F41.1      Plan: Patient in today working more on her anxiety and depression. She is making progress and needs to continue her work with her goal-directed behaviors to move in a positive direction and be able to coach herself along more effectively. Reminded and encouraged patient and practicing more positive and self affirming behaviors as noted in sessions including: Refrain from assuming that things are always going to go in a negative direction, remain in touch with people who are supportive of her, stay in the present focused on what she can change or control versus cannot, get outside some each day, stay on her prescribed medication, healthy nutrition and exercise as she is able, positive self talk, challenge and counteract self-doubt, reduce her overthinking and over analyzing, and recognize the strength she shows when working with goal-directed behaviors  to move in a direction that supports her improved emotional health and overall wellbeing.  Goal review and progress/challenges noted with patient.  Next appointment within 3 weeks.   Mathis Fare, LCSW

## 2023-04-28 NOTE — Progress Notes (Signed)
Laurie Simmons [id number] 05/12/1966 57 y.o.  Subjective:   Patient ID:  Laurie Simmons is a 57 y.o. (DOB [birth year]) female.  Chief Complaint: No chief complaint on file.   HPI Laurie Simmons presents to the office today for follow-up of anxiety, panic attacks, depression, insomnia.  Describes mood today as "lower". Pleasant. Flat. Reports some tearfulness. Mood symptoms - denies depression and irritability. Reports anxiety. Lacking interest and motivation to get up and do the thing she needs to do. Reports small episodes panic attacks. Reports worry, rumination and over thinking. Mood is lower. Stating "I feel like I'm having to push myself more". Reports some concerns about daughter - staying over at boyfriends. Feels like current medications are helpful. Stable interest and motivation. Taking medications as prescribed.  Energy levels lower. Active, does not have a regular exercise routine. Enjoys some usual interests and activities. Married. Lives with husband and 2 children. Appetite adequate. Working on weight loss - 30 pounds since February.  Sleeping well most nights. Averages 7 hours. Reports occasional daytime napping. Focus and concentration difficulties at times. Completing tasks. Managing aspects of household. Working full time. Denies SI or HI. Denies AH or VH. Denies self-harm. Denies substance use.  Therapist - Rockne Menghini.   PHQ2-9    Flowsheet Row Office Visit from 03/21/2023 in [facility] Office Visit from 02/19/2023 in [facility] Office Visit from 01/15/2023 in [facility]  PHQ-2 Total Score 0 0 2  PHQ-9 Total Score -- -- 5      Flowsheet Row Admission (Discharged) from 02/13/2022 in [facility]  C-SSRS RISK CATEGORY No Risk        Review of Systems:  Review of Systems  Musculoskeletal:  Negative for gait problem.  Neurological:   Negative for tremors.  Psychiatric/Behavioral:         Please refer to HPI    Medications: I have reviewed the patient's current medications.  Current Outpatient Medications  Medication Sig Dispense Refill   AIRSUPRA 90-80 MCG/ACT AERO Inhale 2 puffs into the lungs as needed (every 4 to 6 hours for cough, wheeze, shortness of breath.  Rinse, mouth after use). 10.7 g 1   albuterol (VENTOLIN HFA) 108 (90 Base) MCG/ACT inhaler Inhale 2 puffs into the lungs every 4 (four) hours as needed for wheezing or shortness of breath. 18 g 1   anastrozole (ARIMIDEX) 1 MG tablet TAKE 1 TABLET(1 MG) BY MOUTH DAILY 90 tablet 3   ARIPiprazole (ABILIFY) 2 MG tablet Take 1 tablet (2 mg total) by mouth daily. 90 tablet 3   aspirin EC 81 MG tablet Take 1 tablet (81 mg total) by mouth daily. Swallow whole. 90 tablet 3   Azelastine HCl 0.15 % SOLN Can use one spray in each nostril two times daily if needed. (Patient taking differently: Place 1 spray into both nostrils 2 (two) times daily as needed (allergies). Can use one spray in each nostril two times daily if needed.) 90 mL 1   Biotin 78295 MCG TBDP Take 10,000 mcg by mouth daily.     calcium carbonate (TUMS EX) 750 MG chewable tablet Chew 1,500 mg by mouth daily as needed for heartburn.     clorazepate (TRANXENE) 3.75 MG tablet Take one tablet daily as needed for anxiety/panic attacks. 30 tablet 2   Cyanocobalamin (B-12) 5000 MCG CAPS Take 5,000 mcg by mouth daily.  EPINEPHrine (EPIPEN 2-PAK) 0.3 mg/0.3 mL IJ SOAJ injection Use as directed for life-threatening allergic reaction. (Patient taking differently: Inject 0.3 mg into the muscle as needed for anaphylaxis. Use as directed for life-threatening allergic reaction.) 1 each 3   EPINEPHrine (EPIPEN 2-PAK) 0.3 mg/0.3 mL IJ SOAJ injection Use as directed for life-threatening allergic reaction. 2 each 3   famotidine (PEPCID) 40 MG tablet TAKE 1 TABLET BY MOUTH TWICE DAILY AS DIRECTED 60 tablet 5   FARXIGA 10 MG  TABS tablet Take 10 mg by mouth daily.     fluticasone (FLONASE) 50 MCG/ACT nasal spray Place 2 sprays into both nostrils daily as needed for allergies or rhinitis.     folic acid (FOLVITE) 1 MG tablet TAKE 1 TABLET BY MOUTH DAILY 30 tablet 5   gabapentin (NEURONTIN) 300 MG capsule Take 1 capsule (300 mg total) by mouth 2 (two) times daily. (Patient taking differently: Take 300 mg by mouth at bedtime.) 60 capsule 3   loratadine (CLARITIN) 10 MG tablet TAKE 1 TABLET BY MOUTH ONCE DAILY AS NEEDED 90 tablet 1   MOUNJARO 5 MG/0.5ML Pen Inject 5 mg into the skin once a week.     MOUNJARO 7.5 MG/0.5ML Pen Inject into the skin.     ondansetron (ZOFRAN) 4 MG tablet TAKE 1 TABLET BY MOUTH EVERY 4 HOURS AS NEEDED FOR NAUSEA (Patient taking differently: Take 4 mg by mouth every 4 (four) hours as needed for nausea or vomiting.) 30 tablet 0   potassium chloride (KLOR-CON) 10 MEQ tablet TAKE 1 TABLET BY MOUTH TWICE DAILY (Patient taking differently: Take 10 mEq by mouth daily.) 60 tablet 1   prochlorperazine (COMPAZINE) 10 MG tablet TAKE 1 TABLET BY MOUTH EVERY 6 HOURS AS NEEDED FOR NAUSEA (Patient taking differently: Take 10 mg by mouth every 6 (six) hours as needed for nausea or vomiting.) 90 tablet 1   rosuvastatin (CRESTOR) 20 MG tablet Take 1 tablet (20 mg total) by mouth daily. 90 tablet 3   senna-docusate (SENOKOT-S) 8.6-50 MG tablet Take 2 tablets by mouth daily.     topiramate (TOPAMAX) 50 MG tablet Take 1 tablet (50 mg total) by mouth daily. 90 tablet 3   UNABLE TO FIND Inject 1 each as directed See admin instructions. Allergy injections for cats, grass, and dust.     valsartan-hydrochlorothiazide (DIOVAN-HCT) 320-25 MG tablet Take 1 tablet by mouth daily.  0   Vilazodone HCl (VIIBRYD) 40 MG TABS Take 1 tablet (40 mg total) by mouth daily. TAKE 1 TABLET(40 MG) BY MOUTH DAILY 90 tablet 3   WIXELA INHUB 250-50 MCG/ACT AEPB INHALE 1 PUFF BY MOUTH TWICE DAILY TO PREVENT FOR COUGH OR WHEEZE. RINSE MOUTH AFTER  USE. 60 each 5   No current facility-administered medications for this visit.    Medication Side Effects: None  Allergies:  Allergies  Allergen Reactions   Effexor [Venlafaxine] Palpitations   Nsaids Other (See Comments)    Pt reports kidney disease  Pt reports kidney disease, Pt reports kidney disease    Past Medical History:  Diagnosis Date   Abnormal stress test: Anterior wall ischemia cardiac cath after that showing 25% RCA no LAD disease 02/12/2022   Acquired absence of left breast 08/18/2020   Allergic rhinitis 03/10/2015   Anxiety    Anxiety and depression 08/30/2021   Arthritis    Asthma    Bilateral carpal tunnel syndrome 08/25/2018   BMI 40.0-44.9, adult (HCC) 08/23/2019   Breast cancer (HCC) 2021   Carpal tunnel  syndrome    Carpal tunnel syndrome    bilateral   Chronic kidney disease    stage 3   Depression    Diabetes (HCC)    Dyslipidemia 03/27/2022   Essential hypertension 02/12/2022   Fatigue 08/30/2021   Fibroid 05/04/2019   Fibroids, intramural 08/09/2019   GERD (gastroesophageal reflux disease)    Hemodynamic instability 03/10/2015   Hypertension    Idiopathic urticaria 03/10/2015   Iron deficiency anemia due to chronic blood loss 08/09/2019   Irritable bowel syndrome    Malignant neoplasm of female breast (HCC) 04/17/2022   Moderate persistent asthma 03/10/2015   Obesity    Pain in both hands 07/16/2018   PMB (postmenopausal bleeding) 04/17/2022   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   Please see review of systems for further details on the patient's review from today.   Objective:   Physical Exam:  LMP 01/23/2011   Physical Exam Constitutional:       General: She is not in acute distress. Musculoskeletal:        General: No deformity.  Neurological:     Mental Status: She is alert and oriented to person, place, and time.     Coordination: Coordination normal.  Psychiatric:        Attention and Perception: Attention and perception normal. She does not perceive auditory or visual hallucinations.        Mood and Affect: Affect is not labile, blunt, angry or inappropriate.        Speech: Speech normal.        Behavior: Behavior normal.        Thought Content: Thought content normal. Thought content is not paranoid or delusional. Thought content does not include homicidal or suicidal ideation. Thought content does not include homicidal or suicidal plan.        Cognition and Memory: Cognition and memory normal.        Judgment: Judgment normal.     Comments: Insight intact     Lab Review:     Component Value Date/Time   NA 135 03/28/2023 1554   NA 137 02/12/2022 1035   K 2.9 (L) 03/28/2023 1554   CL 97 (L) 03/28/2023 1554   CO2 26 03/28/2023 1554   GLUCOSE 96 03/28/2023 1554   BUN 16 03/28/2023 1554   BUN 16 02/12/2022 1035   CREATININE 1.41 (H) 03/28/2023 1554   CALCIUM 9.6 03/28/2023 1554   PROT 8.2 (H) 03/28/2023 1554   ALBUMIN 4.4 03/28/2023 1554   AST 17 03/28/2023 1554   ALT 19 03/28/2023 1554   ALKPHOS 75 03/28/2023 1554   BILITOT 0.6 03/28/2023 1554   GFRNONAA 44 (L) 03/28/2023 1554   GFRAA  10/20/2008 2105    >60        The eGFR has been calculated using the MDRD equation. This calculation has not been validated in all clinical situations. eGFR's persistently <60 mL/min signify possible Chronic Kidney Disease.       Component Value Date/Time   WBC 9.3 03/28/2023 1554   WBC 12.3 (H) 10/22/2008 0530   RBC 5.39 (H) 03/28/2023 1554   HGB 14.8 03/28/2023 1554   HGB 15.0 02/12/2022 1035   HCT 45.2 03/28/2023 1554   HCT 43.9 02/12/2022  1035   PLT 349 03/28/2023 1554   PLT 329 02/12/2022 1035   MCV 83.9  03/28/2023 1554   MCV 85 02/12/2022 1035   MCV 87 11/07/2020 0000   MCH 27.5 03/28/2023 1554   MCHC 32.7 03/28/2023 1554   RDW 14.0 03/28/2023 1554   RDW 13.1 02/12/2022 1035   LYMPHSABS 2.5 03/28/2023 1554   LYMPHSABS 1.6 02/12/2022 1035   MONOABS 0.7 03/28/2023 1554   EOSABS 0.3 03/28/2023 1554   EOSABS 0.2 02/12/2022 1035   BASOSABS 0.1 03/28/2023 1554   BASOSABS 0.1 02/12/2022 1035    No results found for: "POCLITH", "LITHIUM"   No results found for: "PHENYTOIN", "PHENOBARB", "VALPROATE", "CBMZ"   .res Assessment: Plan:    Plan:  1. Viibryd 40mg  daily 2. Tranxene 3.75mg  once daily 3. Topamax 50 mg daily 4. Increase Abilify 2mg  to 4mg  daily  RTC 2 weeks  Patient advised to contact office with any questions, adverse effects, or acute worsening in signs and symptoms.  Discussed potential benefits, risk, and side effects of benzodiazepines to include potential risk of tolerance and dependence, as well as possible drowsiness. Advised patient not to drive if experiencing drowsiness and to take lowest possible effective dose to minimize risk of dependence and tolerance.  Discussed potential metabolic side effects associated with atypical antipsychotics, as well as potential risk for movement side effects. Advised pt to contact office if movement side effects occur.    There are no diagnoses linked to this encounter.   Please see After Visit Summary for patient specific instructions.  Future Appointments  Date Time Provider Department Center  05/02/2023  1:00 PM Kirsteins, Victorino Sparrow, MD [facility]  05/29/2023 10:00 AM Mathis Fare, LCSW [facility] None  06/23/2023 10:00 AM Mathis Fare, LCSW [facility] None  06/26/2023 10:00 AM Kozlow, Alvira Philips, MD [facility] [place] None    No orders of the defined types were placed in this encounter.   -------------------------------

## 2023-04-29 ENCOUNTER — Telehealth: Payer: Self-pay

## 2023-04-29 ENCOUNTER — Ambulatory Visit (INDEPENDENT_AMBULATORY_CARE_PROVIDER_SITE_OTHER): Payer: Self-pay | Admitting: *Deleted

## 2023-04-29 DIAGNOSIS — J309 Allergic rhinitis, unspecified: Secondary | ICD-10-CM

## 2023-04-29 DIAGNOSIS — R531 Weakness: Secondary | ICD-10-CM | POA: Diagnosis not present

## 2023-04-29 DIAGNOSIS — M7661 Achilles tendinitis, right leg: Secondary | ICD-10-CM | POA: Diagnosis not present

## 2023-04-29 NOTE — Telephone Encounter (Signed)
Notified patient of results. She also states that she will need a script called into pharmacy for potassium cause she quit taking it. Also she will have Dr. Yetta Flock recheck it when she sees her.

## 2023-04-29 NOTE — Telephone Encounter (Signed)
-----   Message from Dellia Beckwith sent at 04/23/2023  6:55 PM EDT ----- Regarding: call Oh no, her K was very low last month and I never saw it.  She needs to take supp. - she had 10 meq so would need to take 4/day and have it rechecked again next month.  Does she need me to send in Rx or does she have? Does she want to repeat here or will she see Dr.Hodges anytime soon?(We will be in new building by then and can have immediate result) The rest looks good except kidney function, which goes up and down

## 2023-04-30 ENCOUNTER — Other Ambulatory Visit: Payer: Self-pay | Admitting: Oncology

## 2023-04-30 DIAGNOSIS — E876 Hypokalemia: Secondary | ICD-10-CM | POA: Insufficient documentation

## 2023-04-30 MED ORDER — POTASSIUM CHLORIDE CRYS ER 10 MEQ PO TBCR
20.0000 meq | EXTENDED_RELEASE_TABLET | Freq: Two times a day (BID) | ORAL | 5 refills | Status: DC
Start: 2023-04-30 — End: 2023-11-04

## 2023-05-01 DIAGNOSIS — R531 Weakness: Secondary | ICD-10-CM | POA: Diagnosis not present

## 2023-05-01 DIAGNOSIS — M7661 Achilles tendinitis, right leg: Secondary | ICD-10-CM | POA: Diagnosis not present

## 2023-05-02 ENCOUNTER — Other Ambulatory Visit: Payer: Self-pay | Admitting: Allergy and Immunology

## 2023-05-02 ENCOUNTER — Telehealth: Payer: Self-pay | Admitting: Physical Medicine & Rehabilitation

## 2023-05-02 ENCOUNTER — Encounter
Payer: BC Managed Care – PPO | Attending: Physical Medicine & Rehabilitation | Admitting: Physical Medicine & Rehabilitation

## 2023-05-02 ENCOUNTER — Encounter: Payer: Self-pay | Admitting: Physical Medicine & Rehabilitation

## 2023-05-02 VITALS — BP 120/84 | HR 92 | Temp 98.7°F | Ht 65.0 in | Wt 241.0 lb

## 2023-05-02 DIAGNOSIS — M47816 Spondylosis without myelopathy or radiculopathy, lumbar region: Secondary | ICD-10-CM | POA: Diagnosis not present

## 2023-05-02 MED ORDER — IOHEXOL 180 MG/ML  SOLN
3.0000 mL | Freq: Once | INTRAMUSCULAR | Status: AC
  Administered 2023-05-02: 3 mL via INTRAVENOUS

## 2023-05-02 MED ORDER — LIDOCAINE HCL 1 % IJ SOLN
5.0000 mL | Freq: Once | INTRAMUSCULAR | Status: AC
Start: 2023-05-02 — End: 2023-05-02
  Administered 2023-05-02: 5 mL

## 2023-05-02 MED ORDER — LIDOCAINE HCL (PF) 2 % IJ SOLN
2.0000 mL | Freq: Once | INTRAMUSCULAR | Status: AC
Start: 2023-05-02 — End: 2023-05-02
  Administered 2023-05-02: 2 mL

## 2023-05-02 NOTE — Progress Notes (Signed)
PROCEDURE RECORD [facility]   Name: Laurie Simmons DOB:[birth year] MRN: [id number]  Date:05/02/2023  Physician: Claudette Laws, MD    Nurse/CMA: Pualani Borah RMA   Allergies:  Allergies  Allergen Reactions   Effexor [Venlafaxine] Palpitations   Nsaids Other (See Comments)    Pt reports kidney disease  Pt reports kidney disease, Pt reports kidney disease    Consent Signed: Yes.    Is patient diabetic? Yes.    CBG today? Not checked  Pregnant: No. LMP: Patient's last menstrual period was 01/23/2011. (age 80-57)  Anticoagulants: no Anti-inflammatory: no Antibiotics: no  Procedure: Medial Branch Blocks  Position: Prone Start Time: 1:22PM  End Time: 1:35 PM  Fluoro Time: 55  RN/CMA Parker MA Tonya Wantz RMA     Time 12:48 PM 1:40PM    BP 120/84 116/82    Pulse 92 77    Respirations 16 16    O2 Sat 96 98    S/S 6 6    Pain Level 4/10 2/10     D/C home with Spouse, patient A & O X 3, D/C instructions reviewed, and sits independently.

## 2023-05-02 NOTE — Progress Notes (Signed)
Bilateral Lumbar L3, L4  medial branch blocks and L 5 dorsal ramus injection under fluoroscopic guidance  Indication: Lumbar pain which is not relieved by medication management or other conservative care and interfering with self-care and mobility.  Informed consent was obtained after describing risks and benefits of the procedure with the patient, this includes bleeding, infection, paralysis and medication side effects.  The patient wishes to proceed and has given written consent.  The patient was placed in prone position.  The lumbar area was marked and prepped with Betadine.  One mL of 1% lidocaine was injected into each of 6 areas into the skin and subcutaneous tissue.  Then a 22-gauge 5in spinal needle was inserted targeting the junction of the left S1 superior articular process and sacral ala junction. Needle was advanced under fluoroscopic guidance.  Bone contact was made.Omnipaque 180 was injected x 0.5 mL demonstrating no intravascular uptake.  Then a solution  of 2% MPF lidocaine was injected x 0.5 mL.  Then the left L5 superior articular process in transverse process junction was targeted.  Bone contact was made. Omnipaque 180 was injected x 0.5 mL demonstrating no intravascular uptake. Then a solution containing  2% MPF lidocaine was injected x 0.5 mL.  Then the left L4 superior articular process in transverse process junction was targeted.  Bone contact was made. Omnipaque 180 was injected x 0.5 mL demonstrating no intravascular uptake.  Then a solution containing2% MPF lidocaine was injected x 0.5 mL.  This same procedure was performed on the right side using the same needle, technique and injectate.  Patient tolerated procedure well.  Post procedure instructions were given.   Lidocaine 1% with preservative multidose, 10ml no waste Lidocaine 2% MPF, 5ml bottle, 3ml used 2ml waste Omnipaque 180 1.5 ml used, 8.5 ml waste  

## 2023-05-02 NOTE — Telephone Encounter (Signed)
Patient is calling to report after her procedure today she is still having pains. She is rating her pain at a level 4.

## 2023-05-05 DIAGNOSIS — M7661 Achilles tendinitis, right leg: Secondary | ICD-10-CM | POA: Diagnosis not present

## 2023-05-05 DIAGNOSIS — R531 Weakness: Secondary | ICD-10-CM | POA: Diagnosis not present

## 2023-05-06 ENCOUNTER — Other Ambulatory Visit: Payer: Self-pay | Admitting: Cardiology

## 2023-05-07 DIAGNOSIS — R531 Weakness: Secondary | ICD-10-CM | POA: Diagnosis not present

## 2023-05-07 DIAGNOSIS — M7661 Achilles tendinitis, right leg: Secondary | ICD-10-CM | POA: Diagnosis not present

## 2023-05-09 ENCOUNTER — Telehealth: Payer: Self-pay | Admitting: Adult Health

## 2023-05-09 NOTE — Telephone Encounter (Signed)
Patient seen on 10/9 with RTC in 2 weeks for followup. Please call to schedule appt.

## 2023-05-09 NOTE — Telephone Encounter (Signed)
I called pt at 2:03p to schedule follow up.  She said she was suppsed to advise Laurie Simmons how she felt the Abilify was working for her.  She said she really can't tell if it's had an effect.  She started to get in to specifics and I asked her to wait until the nurse can call her back to get more details to see what the next steps should be.  No upcoming appt scheduled.

## 2023-05-09 NOTE — Telephone Encounter (Signed)
Patient reporting not much improvement with the increased Abilify. Reports she did have a couple of good days this week where she felt good and was able to accomplish things, but mostly feels sluggish and unmotivated. Seen 10/9 with RTC in 2 weeks. No appt scheduled yet.   Last seen 10/9 1. Viibryd 40mg  daily 2. Tranxene 3.75mg  once daily 3. Topamax 50 mg daily 4. Increase Abilify 2mg  to 4mg  daily   RTC 2 weeks

## 2023-05-12 NOTE — Telephone Encounter (Signed)
Pt is scheduled 11/4 

## 2023-05-14 DIAGNOSIS — M7661 Achilles tendinitis, right leg: Secondary | ICD-10-CM | POA: Diagnosis not present

## 2023-05-14 DIAGNOSIS — R531 Weakness: Secondary | ICD-10-CM | POA: Diagnosis not present

## 2023-05-19 ENCOUNTER — Encounter: Payer: Self-pay | Admitting: Adult Health

## 2023-05-19 ENCOUNTER — Telehealth (INDEPENDENT_AMBULATORY_CARE_PROVIDER_SITE_OTHER): Payer: Medicare PPO | Admitting: Adult Health

## 2023-05-19 DIAGNOSIS — F419 Anxiety disorder, unspecified: Secondary | ICD-10-CM

## 2023-05-19 DIAGNOSIS — F410 Panic disorder [episodic paroxysmal anxiety] without agoraphobia: Secondary | ICD-10-CM

## 2023-05-19 DIAGNOSIS — F411 Generalized anxiety disorder: Secondary | ICD-10-CM

## 2023-05-19 DIAGNOSIS — F39 Unspecified mood [affective] disorder: Secondary | ICD-10-CM

## 2023-05-19 DIAGNOSIS — F32A Depression, unspecified: Secondary | ICD-10-CM | POA: Diagnosis not present

## 2023-05-19 DIAGNOSIS — G4700 Insomnia, unspecified: Secondary | ICD-10-CM | POA: Diagnosis not present

## 2023-05-19 MED ORDER — ARIPIPRAZOLE 2 MG PO TABS: 2.0000 mg | ORAL_TABLET | Freq: Two times a day (BID) | ORAL | 3 refills | Status: DC

## 2023-05-19 MED ORDER — TOPIRAMATE 50 MG PO TABS: 50.0000 mg | ORAL_TABLET | Freq: Every day | ORAL | 3 refills | Status: DC

## 2023-05-19 MED ORDER — VILAZODONE HCL 40 MG PO TABS
40.0000 mg | ORAL_TABLET | Freq: Every day | ORAL | 3 refills | Status: DC
Start: 2023-05-19 — End: 2024-02-20

## 2023-05-19 MED ORDER — CLORAZEPATE DIPOTASSIUM 3.75 MG PO TABS: ORAL_TABLET | ORAL | 2 refills | Status: DC

## 2023-05-19 NOTE — Progress Notes (Signed)
Laurie Simmons [id number] 1966-06-28 57 y.o.  Virtual Visit via Video Note  I connected with pt @ on 05/19/23 at 10:00 AM EST by a video enabled telemedicine application and verified that I am speaking with the correct person using two identifiers.   I discussed the limitations of evaluation and management by telemedicine and the availability of in person appointments. The patient expressed understanding and agreed to proceed.  I discussed the assessment and treatment plan with the patient. The patient was provided an opportunity to ask questions and all were answered. The patient agreed with the plan and demonstrated an understanding of the instructions.   The patient was advised to call back or seek an in-person evaluation if the symptoms worsen or if the condition fails to improve as anticipated.  I provided 25 minutes of non-face-to-face time during this encounter.  The patient was located at home.  The provider was located at [facility].   Dorothyann Gibbs, NP   Subjective:   Patient ID:  Laurie Simmons is a 57 y.o. (DOB Jul 03, 1966) female.  Chief Complaint: No chief complaint on file.   HPI Indy Prestwood Dible presents for follow-up of anxiety, panic attacks, depression, insomnia.  Describes mood today as "better". Pleasant. Flat. Denies tearfulness. Mood symptoms - denies depression and irritability. Reports anxiety - when leaving the house. Denies panic attacks. Lacking interest and motivation - getting better - "I'm trying". Reports worry, rumination and over thinking. Mood has improved. Stating "I feel over whelmed by what I need to do, and don't get help at home". Feels like current medications are helpful. Stable interest and motivation. Taking medications as prescribed.  Energy levels lower - feels tired a lot. Active, does not have a regular exercise routine. 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 Enjoys some usual interests and activities. Married. Lives with husband and 2 children. Appetite  adequate. Working on weight loss - 34 pounds since February.  Sleeping well most nights. Averages 8 hours. Reports occasional daytime napping. Focus and concentration difficulties at times. Completing tasks. Managing aspects of household. Retired. Denies AH or VH. Denies SI or HI. Denies self-harm. Denies substance use.  Therapist - Rockne Menghini.  Review of Systems:  Review of Systems  Musculoskeletal:  Negative for gait problem.  Neurological:  Negative for tremors.  Psychiatric/Behavioral:         Please refer to HPI    Medications: I have reviewed the patient's current medications.  Current Outpatient Medications  Medication Sig Dispense Refill   AIRSUPRA 90-80 MCG/ACT AERO Inhale 2 puffs into the lungs as needed (every 4 to 6 hours for cough, wheeze, shortness of breath.  Rinse, mouth after use). 10.7 g 1   albuterol (VENTOLIN HFA) 108 (90 Base) MCG/ACT inhaler Inhale 2 puffs into the lungs every 4 (four) hours as needed for wheezing or shortness of breath. 18 g 1   anastrozole (ARIMIDEX) 1 MG tablet TAKE 1 TABLET(1 MG) BY MOUTH DAILY 90 tablet 3   ARIPiprazole (ABILIFY) 2 MG tablet Take 1 tablet (2 mg total) by mouth daily. 90 tablet 3   aspirin EC 81 MG tablet Take 1 tablet (81 mg total) by mouth daily. Swallow whole. 90 tablet 3   Azelastine HCl 0.15 % SOLN Can use one spray in each nostril two times daily if needed. (Patient taking differently: Place 1 spray into both nostrils 2 (two) times daily as needed (allergies). Can use one spray in each nostril two times daily if needed.) 90 mL 1  Biotin 16109 MCG TBDP Take 10,000 mcg by mouth daily.     calcium carbonate (TUMS EX) 750 MG chewable tablet Chew 1,500 mg by mouth daily as needed for heartburn.     clorazepate (TRANXENE) 3.75 MG tablet Take one tablet daily as needed for anxiety/panic attacks. 30 tablet 2   Cyanocobalamin (B-12) 5000 MCG CAPS Take 5,000 mcg by mouth daily.     EPINEPHrine (EPIPEN 2-PAK) 0.3 mg/0.3 mL IJ  SOAJ injection Use as directed for life-threatening allergic reaction. (Patient taking differently: Inject 0.3 mg into the muscle as needed for anaphylaxis. Use as directed for life-threatening allergic reaction.) 1 each 3   EPINEPHrine (EPIPEN 2-PAK) 0.3 mg/0.3 mL IJ SOAJ injection Use as directed for life-threatening allergic reaction. 2 each 3   famotidine (PEPCID) 40 MG tablet TAKE 1 TABLET BY MOUTH TWICE DAILY AS DIRECTED 60 tablet 5   FARXIGA 10 MG TABS tablet Take 10 mg by mouth daily.     fluticasone (FLONASE) 50 MCG/ACT nasal spray Place 2 sprays into both nostrils daily as needed for allergies or rhinitis.     fluticasone-salmeterol (WIXELA INHUB) 250-50 MCG/ACT AEPB INHALE 1 PUFF BY MOUTH TWICE DAILY FOR COUGH OR WHEEZE. RINSE MOUTH AFTER USE 60 each 1   folic acid (FOLVITE) 1 MG tablet TAKE 1 TABLET BY MOUTH DAILY 30 tablet 5   gabapentin (NEURONTIN) 300 MG capsule Take 1 capsule (300 mg total) by mouth 2 (two) times daily. (Patient taking differently: Take 300 mg by mouth at bedtime.) 60 capsule 3   loratadine (CLARITIN) 10 MG tablet TAKE 1 TABLET BY MOUTH ONCE DAILY AS NEEDED 90 tablet 1   MOUNJARO 5 MG/0.5ML Pen Inject 5 mg into the skin once a week.     MOUNJARO 7.5 MG/0.5ML Pen Inject into the skin.     ondansetron (ZOFRAN) 4 MG tablet TAKE 1 TABLET BY MOUTH EVERY 4 HOURS AS NEEDED FOR NAUSEA (Patient taking differently: Take 4 mg by mouth every 4 (four) hours as needed for nausea or vomiting.) 30 tablet 0   potassium chloride (KLOR-CON M) 10 MEQ tablet Take 2 tablets (20 mEq total) by mouth 2 (two) times daily. 120 tablet 5   prochlorperazine (COMPAZINE) 10 MG tablet TAKE 1 TABLET BY MOUTH EVERY 6 HOURS AS NEEDED FOR NAUSEA (Patient taking differently: Take 10 mg by mouth every 6 (six) hours as needed for nausea or vomiting.) 90 tablet 1   rosuvastatin (CRESTOR) 20 MG tablet TAKE 1 TABLET(20 MG) BY MOUTH DAILY 90 tablet 2   senna-docusate (SENOKOT-S) 8.6-50 MG tablet Take 2 tablets  by mouth daily.     topiramate (TOPAMAX) 50 MG tablet Take 1 tablet (50 mg total) by mouth daily. 90 tablet 3   UNABLE TO FIND Inject 1 each as directed See admin instructions. Allergy injections for cats, grass, and dust.     valsartan-hydrochlorothiazide (DIOVAN-HCT) 320-25 MG tablet Take 1 tablet by mouth daily.  0   Vilazodone HCl (VIIBRYD) 40 MG TABS Take 1 tablet (40 mg total) by mouth daily. TAKE 1 TABLET(40 MG) BY MOUTH DAILY 90 tablet 3   No current facility-administered medications for this visit.    Medication Side Effects: None  Allergies:  Allergies  Allergen Reactions   Effexor [Venlafaxine] Palpitations   Nsaids Other (See Comments)    Pt reports kidney disease  Pt reports kidney disease, Pt reports kidney disease    Past Medical History:  Diagnosis Date   Abnormal stress test: Anterior wall ischemia cardiac cath  after that showing 25% RCA no LAD disease 02/12/2022   Acquired absence of left breast 08/18/2020   Allergic rhinitis 03/10/2015   Anxiety    Anxiety and depression 08/30/2021   Arthritis    Asthma    Bilateral carpal tunnel syndrome 08/25/2018   BMI 40.0-44.9, adult (HCC) 08/23/2019   Breast cancer (HCC) 2021   Carpal tunnel syndrome    Carpal tunnel syndrome    bilateral   Chronic kidney disease    stage 3   Depression    Diabetes (HCC)    Dyslipidemia 03/27/2022   Essential hypertension 02/12/2022   Fatigue 08/30/2021   Fibroid 05/04/2019   Fibroids, intramural 08/09/2019   GERD (gastroesophageal reflux disease)    Hemodynamic instability 03/10/2015   Hypertension    Idiopathic urticaria 03/10/2015   Iron deficiency anemia due to chronic blood loss 08/09/2019   Irritable bowel syndrome    Malignant neoplasm of female breast (HCC) 04/17/2022   Moderate persistent asthma 03/10/2015   Obesity    Pain in both hands 07/16/2018   PMB (postmenopausal bleeding) 04/17/2022   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    Severe anemia 12/14/2015   Type 2 diabetes mellitus without complication, without long-term current use of insulin (HCC) 02/12/2022    Family History  Problem Relation Age of Onset   Kidney disease Mother    Pulmonary fibrosis Father    Cancer Maternal Grandmother        carcinoma of the vulva and possible ovarian cancer   Prostate cancer Maternal Grandfather    Lung cancer Paternal Grandfather        heavy smoker    Social History   Socioeconomic History   Marital status: Married    Spouse name: Phil   Number of children: 2   Years of education: Not on file   Highest education level: Not on file  Occupational History   Not on file  Tobacco Use   Smoking status: Never   Smokeless tobacco: Never  Vaping Use   Vaping status: Never Used  Substance and Sexual Activity   Alcohol use: No   Drug use: No   Sexual activity: Yes  Other Topics Concern   Not on file  Social History Narrative   Not on file   Social Determinants of Health   Financial Resource Strain: Not on file  Food Insecurity: Not on file  Transportation Needs: Not on file  Physical Activity: Not on file  Stress: Not on file  Social Connections: Not on file  Intimate Partner Violence: Not on file    Past Medical History, Surgical history, Social history, and Family history were reviewed and updated as appropriate.   Please see review of systems for further details on the patient's review from today.   Objective:   Physical Exam:  LMP 01/23/2011   Physical Exam Constitutional:      General: She is not in acute distress. Musculoskeletal:        General: No deformity.  Neurological:     Mental Status: She is alert and oriented to person, place, and time.     Coordination: Coordination normal.  Psychiatric:        Attention and Perception: Attention and perception normal. She does not perceive auditory or visual hallucinations.        Mood and Affect: Affect is not labile, blunt, angry or  inappropriate.        Speech: Speech normal.  Behavior: Behavior normal.        Thought Content: Thought content normal. Thought content is not paranoid or delusional. Thought content does not include homicidal or suicidal ideation. Thought content does not include homicidal or suicidal plan.        Cognition and Memory: Cognition and memory normal.        Judgment: Judgment normal.     Comments: Insight intact     Lab Review:     Component Value Date/Time   NA 135 03/28/2023 1554   NA 137 02/12/2022 1035   K 2.9 (L) 03/28/2023 1554   CL 97 (L) 03/28/2023 1554   CO2 26 03/28/2023 1554   GLUCOSE 96 03/28/2023 1554   BUN 16 03/28/2023 1554   BUN 16 02/12/2022 1035   CREATININE 1.41 (H) 03/28/2023 1554   CALCIUM 9.6 03/28/2023 1554   PROT 8.2 (H) 03/28/2023 1554   ALBUMIN 4.4 03/28/2023 1554   AST 17 03/28/2023 1554   ALT 19 03/28/2023 1554   ALKPHOS 75 03/28/2023 1554   BILITOT 0.6 03/28/2023 1554   GFRNONAA 44 (L) 03/28/2023 1554   GFRAA  10/20/2008 2105    >60        The eGFR has been calculated using the MDRD equation. This calculation has not been validated in all clinical situations. eGFR's persistently <60 mL/min signify possible Chronic Kidney Disease.       Component Value Date/Time   WBC 9.3 03/28/2023 1554   WBC 12.3 (H) 10/22/2008 0530   RBC 5.39 (H) 03/28/2023 1554   HGB 14.8 03/28/2023 1554   HGB 15.0 02/12/2022 1035   HCT 45.2 03/28/2023 1554   HCT 43.9 02/12/2022 1035   PLT 349 03/28/2023 1554   PLT 329 02/12/2022 1035   MCV 83.9 03/28/2023 1554   MCV 85 02/12/2022 1035   MCV 87 11/07/2020 0000   MCH 27.5 03/28/2023 1554   MCHC 32.7 03/28/2023 1554   RDW 14.0 03/28/2023 1554   RDW 13.1 02/12/2022 1035   LYMPHSABS 2.5 03/28/2023 1554   LYMPHSABS 1.6 02/12/2022 1035   MONOABS 0.7 03/28/2023 1554   EOSABS 0.3 03/28/2023 1554   EOSABS 0.2 02/12/2022 1035   BASOSABS 0.1 03/28/2023 1554   BASOSABS 0.1 02/12/2022 1035    No results  found for: "POCLITH", "LITHIUM"   No results found for: "PHENYTOIN", "PHENOBARB", "VALPROATE", "CBMZ"   .res Assessment: Plan:    Plan:  1. Viibryd 40mg  daily 2. Tranxene 3.75mg  once daily 3. Topamax 50 mg daily 4. Increase Abilify 4mg  daily  RTC 4 weeks  Patient advised to contact office with any questions, adverse effects, or acute worsening in signs and symptoms.  Discussed potential benefits, risk, and side effects of benzodiazepines to include potential risk of tolerance and dependence, as well as possible drowsiness. Advised patient not to drive if experiencing drowsiness and to take lowest possible effective dose to minimize risk of dependence and tolerance.  Discussed potential metabolic side effects associated with atypical antipsychotics, as well as potential risk for movement side effects. Advised pt to contact office if movement side effects occur.   There are no diagnoses linked to this encounter.   Please see After Visit Summary for patient specific instructions.  Future Appointments  Date Time Provider Department Center  05/19/2023 10:00 AM Dajion Bickford, Thereasa Solo, NP [facility] None  05/29/2023 10:00 AM Mathis Fare, LCSW [facility] None  06/02/2023 10:00 AM Angelina Sheriff, DO [facility] CPR  06/23/2023 10:00 AM Mathis Fare, LCSW [facility] None  06/26/2023  10:00 AM Kozlow, Alvira Philips, MD [facility] None    No orders of the defined types were placed in this encounter.     -------------------------------

## 2023-05-20 DIAGNOSIS — M7661 Achilles tendinitis, right leg: Secondary | ICD-10-CM | POA: Diagnosis not present

## 2023-05-20 DIAGNOSIS — R531 Weakness: Secondary | ICD-10-CM | POA: Diagnosis not present

## 2023-05-23 DIAGNOSIS — R531 Weakness: Secondary | ICD-10-CM | POA: Diagnosis not present

## 2023-05-23 DIAGNOSIS — M7661 Achilles tendinitis, right leg: Secondary | ICD-10-CM | POA: Diagnosis not present

## 2023-05-28 ENCOUNTER — Ambulatory Visit (INDEPENDENT_AMBULATORY_CARE_PROVIDER_SITE_OTHER): Payer: Self-pay | Admitting: *Deleted

## 2023-05-28 DIAGNOSIS — J309 Allergic rhinitis, unspecified: Secondary | ICD-10-CM | POA: Diagnosis not present

## 2023-05-29 ENCOUNTER — Ambulatory Visit: Payer: Medicare PPO | Admitting: Psychiatry

## 2023-06-02 ENCOUNTER — Encounter: Payer: BC Managed Care – PPO | Admitting: Physical Medicine and Rehabilitation

## 2023-06-05 DIAGNOSIS — M7661 Achilles tendinitis, right leg: Secondary | ICD-10-CM | POA: Diagnosis not present

## 2023-06-05 DIAGNOSIS — R531 Weakness: Secondary | ICD-10-CM | POA: Diagnosis not present

## 2023-06-06 DIAGNOSIS — R531 Weakness: Secondary | ICD-10-CM | POA: Diagnosis not present

## 2023-06-06 DIAGNOSIS — M7661 Achilles tendinitis, right leg: Secondary | ICD-10-CM | POA: Diagnosis not present

## 2023-06-11 ENCOUNTER — Ambulatory Visit (INDEPENDENT_AMBULATORY_CARE_PROVIDER_SITE_OTHER): Payer: BC Managed Care – PPO | Admitting: *Deleted

## 2023-06-11 DIAGNOSIS — J309 Allergic rhinitis, unspecified: Secondary | ICD-10-CM | POA: Diagnosis not present

## 2023-06-16 ENCOUNTER — Encounter
Payer: BC Managed Care – PPO | Attending: Physical Medicine & Rehabilitation | Admitting: Physical Medicine and Rehabilitation

## 2023-06-16 ENCOUNTER — Other Ambulatory Visit: Payer: Self-pay | Admitting: Physical Medicine and Rehabilitation

## 2023-06-16 VITALS — BP 125/86 | HR 97 | Ht 65.0 in | Wt 234.0 lb

## 2023-06-16 DIAGNOSIS — G894 Chronic pain syndrome: Secondary | ICD-10-CM | POA: Diagnosis not present

## 2023-06-16 DIAGNOSIS — N183 Chronic kidney disease, stage 3 unspecified: Secondary | ICD-10-CM | POA: Diagnosis not present

## 2023-06-16 DIAGNOSIS — M47816 Spondylosis without myelopathy or radiculopathy, lumbar region: Secondary | ICD-10-CM | POA: Insufficient documentation

## 2023-06-16 DIAGNOSIS — Z79899 Other long term (current) drug therapy: Secondary | ICD-10-CM | POA: Insufficient documentation

## 2023-06-16 DIAGNOSIS — Z79891 Long term (current) use of opiate analgesic: Secondary | ICD-10-CM | POA: Insufficient documentation

## 2023-06-16 DIAGNOSIS — M1612 Unilateral primary osteoarthritis, left hip: Secondary | ICD-10-CM | POA: Diagnosis not present

## 2023-06-16 DIAGNOSIS — M2170 Unequal limb length (acquired), unspecified site: Secondary | ICD-10-CM | POA: Diagnosis not present

## 2023-06-16 MED ORDER — TRAMADOL HCL 50 MG PO TABS: 50.0000 mg | ORAL_TABLET | Freq: Two times a day (BID) | ORAL | 2 refills | Status: AC | PRN

## 2023-06-16 NOTE — Progress Notes (Signed)
Subjective:    Patient ID: Laurie Simmons, female    DOB: 13-Feb-1966, 57 y.o.   MRN: [id number]  HPI  Laurie Simmons is a 57 y.o. year old female  who  has a past medical history of Abnormal stress test: Anterior wall ischemia cardiac cath after that showing 25% RCA no LAD disease (02/12/2022), Acquired absence of left breast (08/18/2020), Allergic rhinitis (03/10/2015), Anxiety, Anxiety and depression (08/30/2021), Arthritis, Asthma, Bilateral carpal tunnel syndrome (08/25/2018), BMI 40.0-44.9, adult (HCC) (08/23/2019), Breast cancer (HCC) (2021), Carpal tunnel syndrome, Carpal tunnel syndrome, Chronic kidney disease, Depression, Diabetes (HCC), Dyslipidemia (03/27/2022), Essential hypertension (02/12/2022), Fatigue (08/30/2021), Fibroid (05/04/2019), Fibroids, intramural (08/09/2019), GERD (gastroesophageal reflux disease), Hemodynamic instability (03/10/2015), Hypertension, Idiopathic urticaria (03/10/2015), Iron deficiency anemia due to chronic blood loss (08/09/2019), Irritable bowel syndrome, Malignant neoplasm of female breast (HCC) (04/17/2022), Moderate persistent asthma (03/10/2015), Obesity, Pain in both hands (07/16/2018), PMB (postmenopausal bleeding) (04/17/2022), 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   They are presenting to PM&R clinic for follow up related to chronic low back pain .  Plan from last visit:  Chronic pain syndrome Midline low back pain without sciatica, unspecified chronicity -     DG Lumbar Spine 2-3 Views; Future   Obtain Voltaren gel over-the-counter and use it up to 4 times daily on your low back for adjunctive pain control.   Return to clinic in 1 to 2 weeks for trigger point injections   If there are any concerning findings on the low back x-ray as above, I will call let you know.  Otherwise we  can review it during your next visit.   Acquired leg length discrepancy Left groin pain -     DG HIP UNILAT W OR W/O PELVIS 2-3 VIEWS LEFT; Future   I am additionally getting imaging of your left hip, due to suspected osteoarthritis.  If this is seen, I will schedule you an appointment with Dr. Wynn Banker for fluoroscopic guided injection into the left hip to help with pain.  If not, we can discuss the findings next visit.   Acute pain of left shoulder   Follow-up for trigger point injections in 2 weeks.  He can also use Voltaren gel on your neck and shoulder.  If no improvements by next visit or trigger point injections, we may get imaging to further follow-up on this, however there were no concerning findings today.  Per MyChart message 8/19:  1) Left hip xray - Moderate arthritis of your left hip joint, along with some arthritis in the pubic symphisis, which is the junction of your pelvic bones just above your groin in the middle. Based on this finding, if you are ok with it, I would want to refer you to Dr. Wynn Banker for a steroid injection into the left hip under flouroscopy to see if this will help your left groin pain. Let me know and I will arrange it.   2) Lumbar spine xray - severe facet arthopathy, or arthritis of the joints in the lower spine, as well as grade 1 anteriolisthesis at L4-5; this means the bones have shifted a bit due to arthritis, but the good news is it hasn't changed since your last imaging in 2022. You also have a degenerative disc at T12-L1, which may or may not cause pain. Finally, incidentally they saw some calcifications in your aorta, which are chronic vessel  changes and should be noted to your family doctor, but dont require any kind of emergent intervention.    I would say, based on this, you probably have a lot of arthritis in your low back that could be causing pain. There are treatments for this, but they generally require a little leg work to get approved;  mainly, failing PT and trying other medications before they will allow injections. If you want to schedule a follow up to discuss this further, we can, or I can also just have Dr. Wynn Banker evaluate you for this since he does the procedures.    Interval Hx:  - Therapies: Has been in PT for R achilles tendonitis for a few months 2x weekly, 8-10 sessions - sent by her PCP. This has been helpful. Has not been in it for her low back. She is independent of ADLs but can only stand up for about 10 minutes at a time before she needs to sit down, so she gets delivery for groceries and does household chores in short bursts. She lives with her husband and 2 children, who do not provide assistance.    - Follow ups:  Bilateral Lumbar L3, L4  medial branch blocks and L 5 dorsal ramus injection under fluoroscopic guidance with Dr. Wynn Banker 10/18 - not effective; did not have any effect, in fact patient thinks these made her pain worse for a few days.   R low back and neck TPI 02/19/23 - she is unsure if this had any effect   - Falls: none   - DME: none; interested in heat/ice but unsure which to use. Does not have or has not tried a TENs unit.    - Medications: Voltaren gel - has not been using, she says she prefers to sit down and rest instead of taking medication. She still takes Tylenol sporadically, about Q48H, unsure if this is effective but may bring it down 1-2 points.   Endorses CKD stage 3 due to excessive ibuprofen use.    - Other concerns: She says sitting is now bothersome as well. She has been hurting at nighttime which causes her to shift positions and roll over frequently.   Pain Inventory Average Pain 4 Pain Right Now 7 My pain is aching  In the last 24 hours, has pain interfered with the following? General activity 5 Relation with others 2 Enjoyment of life 6 What TIME of day is your pain at its worst? daytime Sleep (in general) Fair  Pain is worse with: walking and standing Pain  improves with: rest Relief from Meds:  can't take meds  Family History  Problem Relation Age of Onset   Kidney disease Mother    Pulmonary fibrosis Father    Cancer Maternal Grandmother        carcinoma of the vulva and possible ovarian cancer   Prostate cancer Maternal Grandfather    Lung cancer Paternal Grandfather        heavy smoker   Social History   Socioeconomic History   Marital status: Married    Spouse name: Phil   Number of children: 2   Years of education: Not on file   Highest education level: Not on file  Occupational History   Not on file  Tobacco Use   Smoking status: Never   Smokeless tobacco: Never  Vaping Use   Vaping status: Never Used  Substance and Sexual Activity   Alcohol use: No   Drug use: No   Sexual activity: Yes  Other Topics Concern   Not on file  Social History Narrative   Not on file   Social Determinants of Health   Financial Resource Strain: Not on file  Food Insecurity: Not on file  Transportation Needs: Not on file  Physical Activity: Not on file  Stress: Not on file  Social Connections: Not on file   Past Surgical History:  Procedure Laterality Date   caesarean     CESAREAN SECTION     CHOLECYSTECTOMY     DILATION AND CURETTAGE OF UTERUS     HIP SURGERY Left    LEFT HEART CATH AND CORONARY ANGIOGRAPHY N/A 02/13/2022   Procedure: LEFT HEART CATH AND CORONARY ANGIOGRAPHY;  Surgeon: Corky Crafts, MD;  Location: [facility] INVASIVE CV LAB;  Service: Cardiovascular;  Laterality: N/A;   MASTECTOMY     PORT A CATH INJECTION ([facility] HX)  2021   Past Surgical History:  Procedure Laterality Date   caesarean     CESAREAN SECTION     CHOLECYSTECTOMY     DILATION AND CURETTAGE OF UTERUS     HIP SURGERY Left    LEFT HEART CATH AND CORONARY ANGIOGRAPHY N/A 02/13/2022   Procedure: LEFT HEART CATH AND CORONARY ANGIOGRAPHY;  Surgeon: Corky Crafts, MD;  Location: [facility] INVAS[facility] CV LAB;  Service: Cardiovascular;  Laterality: N/A;    MASTECTOMY     PORT A CATH INJECTION ([facility] HX)  2021   Past Medical History:  Diagnosis Date   Abnormal stress test: Anterior wall ischemia cardiac cath after that showing 25% RCA no LAD disease 02/12/2022   Acquired absence of left breast 08/18/2020   Allergic rhinitis 03/10/2015   Anxiety    Anxiety and depression 08/30/2021   Arthritis    Asthma    Bilateral carpal tunnel syndrome 08/25/2018   BMI 40.0-44.9, adult (HCC) 08/23/2019   Breast cancer (HCC) 2021   Carpal tunnel syndrome    Carpal tunnel syndrome    bilateral   Chronic kidney disease    stage 3   Depression    Diabetes (HCC)    Dyslipidemia 03/27/2022   Essential hypertension 02/12/2022   Fatigue 08/30/2021   Fibroid 05/04/2019   Fibroids, intramural 08/09/2019   GERD (gastroesophageal reflux disease)    Hemodynamic instability 03/10/2015   Hypertension    Idiopathic urticaria 03/10/2015   Iron deficiency anemia due to chronic blood loss 08/09/2019   Irritable bowel syndrome    Malignant neoplasm of female breast (HCC) 04/17/2022   Moderate persistent asthma 03/10/2015   Obesity    Pain in both hands 07/16/2018   PMB (postmenopausal bleeding) 04/17/2022   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   Severe anemia 12/14/2015   Type 2 diabetes mellitus without complication, without long-term current use of insulin (HCC) 02/12/2022   BP 125/86   Pulse 97   Ht 5\' 5"  (1.651 m)   Wt 234 lb (106.1 kg)   LMP 01/23/2011   SpO2 98%   BMI 38.94 kg/m   Opioid Risk Score:   Fall Risk Score:  `1  Depression screen [facility] 2/9     05/02/2023   12:46 PM 03/21/2023   10:13 AM 02/19/2023   11:26 AM 01/15/2023   10:40 AM  Depression screen PHQ 2/9  Decreased Interest 0 0 0 1  Down, Depressed, Hopeless 0 0 0 1  PHQ - 2 Score 0 0 0 2  Altered sleeping    1  Tired, decreased energy  1  Change in appetite    0  Feeling bad or failure about yourself     0  Trouble concentrating    1   Moving slowly or fidgety/restless    0  Suicidal thoughts    0  PHQ-9 Score    5      Review of Systems  Musculoskeletal:  Positive for back pain.  All other systems reviewed and are negative.     Objective:   Physical Exam  PE: Constitution: Appropriate appearance for age. No apparent distress  +Obese Resp: No respiratory distress. No accessory muscle usage. on RA and CTAB Cardio: Well perfused appearance. No peripheral edema. Abdomen: Nondistended. Nontender.   Psych: Appropriate mood and affect. Neuro: AAOx4. No apparent cognitive deficits    Neurologic Exam:   DTRs: Reflexes were 2+ in bilateral achilles, patella, biceps, BR and triceps. Babinsky: flexor responses b/l.   Hoffmans: negative b/l Sensory exam: revealed normal sensation in all dermatomal regions in bilateral upper extremities and bilateral lower extremities Motor exam: strength 5/5 throughout bilateral upper extremities and bilateral lower extremities Coordination: Fine motor coordination was normal.   Gait: normal    Back Exam:   Inspection: Pelvis was even.  Lumbar lordotic curvature was  wnl .  There was no evidence of scoliosis. +Bilateral  Genu valgum.  Palpation: + Tight, ropey paraspinals L>R   Special/provocative testing:    SLR: -   Slump test: -    Facet loading: -(very non-specific)   TTP at paraspinals: + mild (sensitive for facet pain...if no ttp then likely not facet pain)   Pearlean Brownie test: + L groin pain   FAIR test: + L groin pain   Thomas Test: -        Assessment & Plan:   Laurie Simmons is a 57 y.o. year old female  who  has a past medical history of Abnormal stress test: Anterior wall ischemia cardiac cath after that showing 25% RCA no LAD disease (02/12/2022), Acquired absence of left breast (08/18/2020), Allergic rhinitis (03/10/2015), Anxiety, Anxiety and depression (08/30/2021), Arthritis, Asthma, Bilateral carpal tunnel syndrome (08/25/2018), BMI 40.0-44.9, adult (HCC)  (08/23/2019), Breast cancer (HCC) (2021), Carpal tunnel syndrome, Carpal tunnel syndrome, Chronic kidney disease, Depression, Diabetes (HCC), Dyslipidemia (03/27/2022), Essential hypertension (02/12/2022), Fatigue (08/30/2021), Fibroid (05/04/2019), Fibroids, intramural (08/09/2019), GERD (gastroesophageal reflux disease), Hemodynamic instability (03/10/2015), Hypertension, Idiopathic urticaria (03/10/2015), Iron deficiency anemia due to chronic blood loss (08/09/2019), Irritable bowel syndrome, Malignant neoplasm of female breast (HCC) (04/17/2022), Moderate persistent asthma (03/10/2015), Obesity, Pain in both hands (07/16/2018), PMB (postmenopausal bleeding) (04/17/2022), 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   They are presenting to PM&R clinic for follow up related to chronic low back pain .  Chronic pain syndrome Spondylosis without myelopathy or radiculopathy, lumbar region Lumbar facet arthropathy Continue Tylenol; advised QAM daily use instead of intermittent Discussed risks vs benefits of starting Butrans patch vs Tramadol; patient opted for tramadol as below:   Indication for chronic opioid: Lumbar facet arthropathy Medication and dose: Tramadol 50 mg BID PRN # pills per month: 60 Last UDS date: 06/16/23 Opioid Treatment Agreement signed (Y/N): Y, 06/16/23 Opioid Treatment Agreement last reviewed with patient:   NCCSRS/PDMP reviewed this encounter (include red flags): Yes   Management will include: Low Risk (<10 MME) UDS every 6-12 months NCCSR check every visit Follow up Q3M initially, Q6M once established  Referral to PT for low back pain/gait + TENS unit as appropriate  Follow up in 3 months with myself or Riley Lam; if no improvement with medication and PT, will refer to Neurosurgery given no effect of MBB and  significant mobility difficulty due to low back pain  Acquired leg length discrepancy Arthritis of left hip  Discussed referral back ot Dr. Wynn Banker for L hip injection; will hold off for now given back as primary pain generator and follow up next visit  CKD stage 3, unspecified a or b   NSAIDs contraindicated

## 2023-06-16 NOTE — Patient Instructions (Signed)
Chronic pain syndrome Spondylosis without myelopathy or radiculopathy, lumbar region Lumbar facet arthropathy  Discussed risks vs benefits of starting Butrans patch vs Tramadol; patient opted for tramadol as below:   Indication for chronic opioid: Lumbar facet arthropathy Medication and dose: Tramadol 50 mg BID PRN # pills per month: 60 Last UDS date: 06/16/23 Opioid Treatment Agreement signed (Y/N): Y, 06/16/23 Opioid Treatment Agreement last reviewed with patient:   NCCSRS/PDMP reviewed this encounter (include red flags): Yes   Management will include: Low Risk (<10 MME) UDS every 6-12 months NCCSR check every visit Follow up Q3M initially, Q6M once established   Referral to PT for low back pain/gait + TENS unit as appropriate  Follow up in 3 months with myself or Riley Lam; if no improvement with medication and PT, will refer to Neurosurgery given no effect of MBB and significant mobility difficulty due to low back pain  Acquired leg length discrepancy Arthritis of left hip  Discussed referral back ot Dr. Wynn Banker for L hip injection; will hold off for now given back as primary pain generator and follow up next visit  CKD stage 3, unspecified a or b   NSAIDs contraindicated

## 2023-06-20 LAB — TOXASSURE SELECT,+ANTIDEPR,UR

## 2023-06-23 ENCOUNTER — Ambulatory Visit (INDEPENDENT_AMBULATORY_CARE_PROVIDER_SITE_OTHER): Payer: BC Managed Care – PPO | Admitting: Psychiatry

## 2023-06-23 DIAGNOSIS — F411 Generalized anxiety disorder: Secondary | ICD-10-CM | POA: Diagnosis not present
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
## 2023-06-24 ENCOUNTER — Telehealth: Payer: BC Managed Care – PPO | Admitting: Adult Health

## 2023-06-24 ENCOUNTER — Encounter: Payer: Self-pay | Admitting: Adult Health

## 2023-06-24 DIAGNOSIS — F411 Generalized anxiety disorder: Secondary | ICD-10-CM

## 2023-06-24 DIAGNOSIS — F410 Panic disorder [episodic paroxysmal anxiety] without agoraphobia: Secondary | ICD-10-CM

## 2023-06-24 DIAGNOSIS — F331 Major depressive disorder, recurrent, moderate: Secondary | ICD-10-CM | POA: Diagnosis not present

## 2023-06-24 DIAGNOSIS — G4700 Insomnia, unspecified: Secondary | ICD-10-CM

## 2023-06-24 NOTE — Progress Notes (Signed)
Laurie Simmons [id number] 11-25-1965 57 y.o.  Virtual Visit via Video Note  I connected with pt @ on 06/24/23 at 10:00 AM EST by a video enabled telemedicine application and verified that I am speaking with the correct person using two identifiers.   I discussed the limitations of evaluation and management by telemedicine and the availability of in person appointments. The patient expressed understanding and agreed to proceed.  I discussed the assessment and treatment plan with the patient. The patient was provided an opportunity to ask questions and all were answered. The patient agreed with the plan and demonstrated an understanding of the instructions.   The patient was advised to call back or seek an in-person evaluation if the symptoms worsen or if the condition fails to improve as anticipated.  I provided 25 minutes of non-face-to-face time during this encounter.  The patient was located at home.  The provider was located at [facility].   Laurie Gibbs, NP   Subjective:   Patient ID:  Laurie Simmons is a 57 y.o. (DOB Apr 14, 1966) female.  Chief Complaint: No chief complaint on file.   HPI Laurie Simmons presents for follow-up of anxiety, panic attacks, depression, insomnia.  Describes mood today as "ok". Pleasant. Denies tearfulness. Mood symptoms - denies depression - feeling a little overwhelmed and frustrated with all the things she needs to get done. Reports some irritability. Reports anxiety - "it comes and goes". Denies panic attacks. Reports varying interest and motivation. Reports some worry, rumination and over thinking. Mood is consistent. Stating "I feel better than I did before". Feels like current medications are helpful. Stable interest and motivation. Taking medications as prescribed.  Energy levels improved - still tired, but can make herself get up and go. Active, does not have a regular exercise routine. 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 Enjoys some usual interests and  activities. Married. Lives with husband and 2 children. Appetite adequate. Working on weight loss. Sleeping well most nights. Averages 8 hours. Reports occasional daytime napping. Focus and concentration difficulties at times. Completing tasks. Managing aspects of household. Retired. Denies AH or VH. Denies SI or HI. Denies self-harm. Denies substance use.  Therapist - Rockne Menghini.  Review of Systems:  Review of Systems  Musculoskeletal:  Negative for gait problem.  Neurological:  Negative for tremors.  Psychiatric/Behavioral:         Please refer to HPI   Medications: I have reviewed the patient's current medications.  Current Outpatient Medications  Medication Sig Dispense Refill   AIRSUPRA 90-80 MCG/ACT AERO Inhale 2 puffs into the lungs as needed (every 4 to 6 hours for cough, wheeze, shortness of breath.  Rinse, mouth after use). 10.7 g 1   albuterol (VENTOLIN HFA) 108 (90 Base) MCG/ACT inhaler Inhale 2 puffs into the lungs every 4 (four) hours as needed for wheezing or shortness of breath. 18 g 1   anastrozole (ARIMIDEX) 1 MG tablet TAKE 1 TABLET(1 MG) BY MOUTH DAILY 90 tablet 3   ARIPiprazole (ABILIFY) 2 MG tablet Take 1 tablet (2 mg total) by mouth 2 (two) times daily. 180 tablet 3   aspirin EC 81 MG tablet Take 1 tablet (81 mg total) by mouth daily. Swallow whole. 90 tablet 3   Azelastine HCl 0.15 % SOLN Can use one spray in each nostril two times daily if needed. (Patient taking differently: Place 1 spray into both nostrils 2 (two) times daily as needed (allergies). Can use one spray in each nostril two times daily if  needed.) 90 mL 1   Biotin 16109 MCG TBDP Take 10,000 mcg by mouth daily.     calcium carbonate (TUMS EX) 750 MG chewable tablet Chew 1,500 mg by mouth daily as needed for heartburn.     clorazepate (TRANXENE) 3.75 MG tablet Take one tablet daily as needed for anxiety/panic attacks. 30 tablet 2   Cyanocobalamin (B-12) 5000 MCG CAPS Take 5,000 mcg by mouth daily.      EPINEPHrine (EPIPEN 2-PAK) 0.3 mg/0.3 mL IJ SOAJ injection Use as directed for life-threatening allergic reaction. (Patient taking differently: Inject 0.3 mg into the muscle as needed for anaphylaxis. Use as directed for life-threatening allergic reaction.) 1 each 3   EPINEPHrine (EPIPEN 2-PAK) 0.3 mg/0.3 mL IJ SOAJ injection Use as directed for life-threatening allergic reaction. 2 each 3   famotidine (PEPCID) 40 MG tablet TAKE 1 TABLET BY MOUTH TWICE DAILY AS DIRECTED 60 tablet 5   FARXIGA 10 MG TABS tablet Take 10 mg by mouth daily.     fluticasone (FLONASE) 50 MCG/ACT nasal spray Place 2 sprays into both nostrils daily as needed for allergies or rhinitis.     fluticasone-salmeterol (WIXELA INHUB) 250-50 MCG/ACT AEPB INHALE 1 PUFF BY MOUTH TWICE DAILY FOR COUGH OR WHEEZE. RINSE MOUTH AFTER USE 60 each 1   folic acid (FOLVITE) 1 MG tablet TAKE 1 TABLET BY MOUTH DAILY 30 tablet 5   gabapentin (NEURONTIN) 300 MG capsule Take 1 capsule (300 mg total) by mouth 2 (two) times daily. (Patient taking differently: Take 300 mg by mouth at bedtime.) 60 capsule 3   loratadine (CLARITIN) 10 MG tablet TAKE 1 TABLET BY MOUTH ONCE DAILY AS NEEDED 90 tablet 1   MOUNJARO 5 MG/0.5ML Pen Inject 5 mg into the skin once a week.     MOUNJARO 7.5 MG/0.5ML Pen Inject into the skin.     ondansetron (ZOFRAN) 4 MG tablet TAKE 1 TABLET BY MOUTH EVERY 4 HOURS AS NEEDED FOR NAUSEA (Patient taking differently: Take 4 mg by mouth every 4 (four) hours as needed for nausea or vomiting.) 30 tablet 0   potassium chloride (KLOR-CON M) 10 MEQ tablet Take 2 tablets (20 mEq total) by mouth 2 (two) times daily. 120 tablet 5   prochlorperazine (COMPAZINE) 10 MG tablet TAKE 1 TABLET BY MOUTH EVERY 6 HOURS AS NEEDED FOR NAUSEA (Patient taking differently: Take 10 mg by mouth every 6 (six) hours as needed for nausea or vomiting.) 90 tablet 1   rosuvastatin (CRESTOR) 20 MG tablet TAKE 1 TABLET(20 MG) BY MOUTH DAILY 90 tablet 2    senna-docusate (SENOKOT-S) 8.6-50 MG tablet Take 2 tablets by mouth daily.     topiramate (TOPAMAX) 50 MG tablet Take 1 tablet (50 mg total) by mouth daily. 90 tablet 3   traMADol (ULTRAM) 50 MG tablet Take 1 tablet (50 mg total) by mouth 2 (two) times daily as needed. 60 tablet 2   UNABLE TO FIND Inject 1 each as directed See admin instructions. Allergy injections for cats, grass, and dust.     valsartan-hydrochlorothiazide (DIOVAN-HCT) 320-25 MG tablet Take 1 tablet by mouth daily.  0   Vilazodone HCl (VIIBRYD) 40 MG TABS Take 1 tablet (40 mg total) by mouth daily. TAKE 1 TABLET(40 MG) BY MOUTH DAILY 90 tablet 3   No current facility-administered medications for this visit.    Medication Side Effects: None  Allergies:  Allergies  Allergen Reactions   Effexor [Venlafaxine] Palpitations   Nsaids Other (See Comments)    Pt reports  kidney disease  Pt reports kidney disease, Pt reports kidney disease    Past Medical History:  Diagnosis Date   Abnormal stress test: Anterior wall ischemia cardiac cath after that showing 25% RCA no LAD disease 02/12/2022   Acquired absence of left breast 08/18/2020   Allergic rhinitis 03/10/2015   Anxiety    Anxiety and depression 08/30/2021   Arthritis    Asthma    Bilateral carpal tunnel syndrome 08/25/2018   BMI 40.0-44.9, adult (HCC) 08/23/2019   Breast cancer (HCC) 2021   Carpal tunnel syndrome    Carpal tunnel syndrome    bilateral   Chronic kidney disease    stage 3   Depression    Diabetes (HCC)    Dyslipidemia 03/27/2022   Essential hypertension 02/12/2022   Fatigue 08/30/2021   Fibroid 05/04/2019   Fibroids, intramural 08/09/2019   GERD (gastroesophageal reflux disease)    Hemodynamic instability 03/10/2015   Hypertension    Idiopathic urticaria 03/10/2015   Iron deficiency anemia due to chronic blood loss 08/09/2019   Irritable bowel syndrome    Malignant neoplasm of female breast (HCC) 04/17/2022   Moderate persistent asthma  03/10/2015   Obesity    Pain in both hands 07/16/2018   PMB (postmenopausal bleeding) 04/17/2022   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   Severe anemia 12/14/2015   Type 2 diabetes mellitus without complication, without long-term current use of insulin (HCC) 02/12/2022    Family History  Problem Relation Age of Onset   Kidney disease Mother    Pulmonary fibrosis Father    Cancer Maternal Grandmother        carcinoma of the vulva and possible ovarian cancer   Prostate cancer Maternal Grandfather    Lung cancer Paternal Grandfather        heavy smoker    Social History   Socioeconomic History   Marital status: Married    Spouse name: Phil   Number of children: 2   Years of education: Not on file   Highest education level: Not on file  Occupational History   Not on file  Tobacco Use   Smoking status: Never   Smokeless tobacco: Never  Vaping Use   Vaping status: Never Used  Substance and Sexual Activity   Alcohol use: No   Drug use: No   Sexual activity: Yes  Other Topics Concern   Not on file  Social History Narrative   Not on file   Social Determinants of Health   Financial Resource Strain: Not on file  Food Insecurity: Not on file  Transportation Needs: Not on file  Physical Activity: Not on file  Stress: Not on file  Social Connections: Not on file  Intimate Partner Violence: Not on file    Past Medical History, Surgical history, Social history, and Family history were reviewed and updated as appropriate.   Please see review of systems for further details on the patient's review from today.   Objective:   Physical Exam:  LMP 01/23/2011   Physical Exam Constitutional:      General: She is not in acute distress. Musculoskeletal:        General: No deformity.  Neurological:     Mental Status: She is alert and oriented to person, place, and time.     Coordination: Coordination normal.  Psychiatric:        Attention  and Perception: Attention and perception normal. She does not perceive auditory or visual hallucinations.  Mood and Affect: Affect is not labile, blunt, angry or inappropriate.        Speech: Speech normal.        Behavior: Behavior normal.        Thought Content: Thought content normal. Thought content is not paranoid or delusional. Thought content does not include homicidal or suicidal ideation. Thought content does not include homicidal or suicidal plan.        Cognition and Memory: Cognition and memory normal.        Judgment: Judgment normal.     Comments: Insight intact     Lab Review:     Component Value Date/Time   NA 135 03/28/2023 1554   NA 137 02/12/2022 1035   K 2.9 (L) 03/28/2023 1554   CL 97 (L) 03/28/2023 1554   CO2 26 03/28/2023 1554   GLUCOSE 96 03/28/2023 1554   BUN 16 03/28/2023 1554   BUN 16 02/12/2022 1035   CREATININE 1.41 (H) 03/28/2023 1554   CALCIUM 9.6 03/28/2023 1554   PROT 8.2 (H) 03/28/2023 1554   ALBUMIN 4.4 03/28/2023 1554   AST 17 03/28/2023 1554   ALT 19 03/28/2023 1554   ALKPHOS 75 03/28/2023 1554   BILITOT 0.6 03/28/2023 1554   GFRNONAA 44 (L) 03/28/2023 1554   GFRAA  10/20/2008 2105    >60        The eGFR has been calculated using the MDRD equation. This calculation has not been validated in all clinical situations. eGFR's persistently <60 mL/min signify possible Chronic Kidney Disease.       Component Value Date/Time   WBC 9.3 03/28/2023 1554   WBC 12.3 (H) 10/22/2008 0530   RBC 5.39 (H) 03/28/2023 1554   HGB 14.8 03/28/2023 1554   HGB 15.0 02/12/2022 1035   HCT 45.2 03/28/2023 1554   HCT 43.9 02/12/2022 1035   PLT 349 03/28/2023 1554   PLT 329 02/12/2022 1035   MCV 83.9 03/28/2023 1554   MCV 85 02/12/2022 1035   MCV 87 11/07/2020 0000   MCH 27.5 03/28/2023 1554   MCHC 32.7 03/28/2023 1554   RDW 14.0 03/28/2023 1554   RDW 13.1 02/12/2022 1035   LYMPHSABS 2.5 03/28/2023 1554   LYMPHSABS 1.6 02/12/2022 1035    MONOABS 0.7 03/28/2023 1554   EOSABS 0.3 03/28/2023 1554   EOSABS 0.2 02/12/2022 1035   BASOSABS 0.1 03/28/2023 1554   BASOSABS 0.1 02/12/2022 1035    No results found for: "POCLITH", "LITHIUM"   No results found for: "PHENYTOIN", "PHENOBARB", "VALPROATE", "CBMZ"   .res Assessment: Plan:    Plan:  1. Viibryd 40mg  daily 2. Tranxene 3.75mg  once daily 3. Topamax 50 mg daily 4. Abilify 4mg  daily  RTC 3 months  Patient advised to contact office with any questions, adverse effects, or acute worsening in signs and symptoms.  Discussed potential benefits, risk, and side effects of benzodiazepines to include potential risk of tolerance and dependence, as well as possible drowsiness. Advised patient not to drive if experiencing drowsiness and to take lowest possible effective dose to minimize risk of dependence and tolerance.  Discussed potential metabolic side effects associated with atypical antipsychotics, as well as potential risk for movement side effects. Advised pt to contact office if movement side effects occur.    Diagnoses and all orders for this visit:  Major depressive disorder, recurrent episode, moderate (HCC)  Generalized anxiety disorder  Panic attacks  Insomnia, unspecified type     Please see After Visit Summary for patient specific instructions.  Future Appointments  Date Time Provider Department Center  06/26/2023 10:00 AM Kozlow, Alvira Philips, MD [facility] [place] None  07/21/2023 10:00 AM Mathis Fare, LCSW [facility] None  08/26/2023 10:00 AM Mathis Fare, LCSW [facility] None  09/15/2023 10:20 AM Jones Bales, NP [facility] CPR    No orders of the defined types were placed in this encounter.     -------------------------------

## 2023-06-25 DIAGNOSIS — M47816 Spondylosis without myelopathy or radiculopathy, lumbar region: Secondary | ICD-10-CM | POA: Diagnosis not present

## 2023-06-25 DIAGNOSIS — M2560 Stiffness of unspecified joint, not elsewhere classified: Secondary | ICD-10-CM | POA: Diagnosis not present

## 2023-06-25 DIAGNOSIS — R531 Weakness: Secondary | ICD-10-CM | POA: Diagnosis not present

## 2023-06-26 ENCOUNTER — Ambulatory Visit: Payer: BC Managed Care – PPO | Admitting: Allergy and Immunology

## 2023-06-26 ENCOUNTER — Encounter: Payer: Self-pay | Admitting: Allergy and Immunology

## 2023-06-26 VITALS — BP 120/88 | HR 84 | Resp 22 | Ht 63.8 in | Wt 235.0 lb

## 2023-06-26 DIAGNOSIS — J4540 Moderate persistent asthma, uncomplicated: Secondary | ICD-10-CM | POA: Diagnosis not present

## 2023-06-26 DIAGNOSIS — K219 Gastro-esophageal reflux disease without esophagitis: Secondary | ICD-10-CM | POA: Diagnosis not present

## 2023-06-26 DIAGNOSIS — J309 Allergic rhinitis, unspecified: Secondary | ICD-10-CM

## 2023-06-26 DIAGNOSIS — J3089 Other allergic rhinitis: Secondary | ICD-10-CM

## 2023-06-26 NOTE — Patient Instructions (Signed)
  1. Continue Wixela 250 - 1 inhalations 1-2 times per day    2. Continue Flonase - 1 spray each nostril 3-7 times per week  3. Continue famotidine 40 mg -1 tablet 2 times per day  4. Continue immunotherapy and EpiPen  5. If needed:   A. Airsupra - 2 inhalations every 6 hours (replaces albuterol)  B. OTC antihistamime  C. Nasal saline  6. Return to clinic in 6 months or earlier if problem

## 2023-06-26 NOTE — Progress Notes (Signed)
[place] [facility] [place] - [place] - [place] - [place]   Follow-up Note  Referring Provider: Abner Greenspan, MD Primary Provider: Abner Greenspan, MD Date of Office Visit: 06/26/2023  Subjective:   Laurie Simmons (DOB: [birth year]) is a 57 y.o. female who [facility] in re-evaluation of the following:  HPI: Laurie Simmons returns to this clinic in evaluation of asthma, allergic rhinoconjunctivitis, reflux.  I last saw her in this clinic 25 December 2022.  She has done very well with her asthma and has not required a systemic steroid and rarely uses a short acting bronchodilator and can exert herself without any problem while using her Wixela.  She has had very little problems with her nose while using a nasal steroid about twice a day and has not required a antibiotic treat an episode of sinusitis.    However, she has been a little bit stuffy for the past 2 weeks and this actually correlates with a bad flare of her reflux with lots of regurgitation and burping that appeared to be precipitated by eating unusual foods at Thanksgiving.  Fortunately, both her nose and her reflux are much better this week.  She continues on immunotherapy currently at every 2-week administration without adverse effect.  She did receive the flu vaccine.  Allergies as of 06/26/2023       Reactions   Effexor [venlafaxine] Palpitations   Nsaids Other (See Comments)   Pt reports kidney disease Pt reports kidney disease, Pt reports kidney disease        Medication List    Airsupra 90-80 MCG/ACT Aero Generic drug: Albuterol-Budesonide Inhale 2 puffs into the lungs as needed (every 4 to 6 hours for cough, wheeze, shortness of breath.  Rinse, mouth after use).   anastrozole 1 MG tablet Commonly known as: ARIMIDEX TAKE 1 TABLET(1 MG) BY MOUTH DAILY   ARIPiprazole 2 MG tablet Commonly known as: Abilify Take 1 tablet (2 mg total) by mouth 2 (two) times daily.   aspirin EC 81  MG tablet Take 1 tablet (81 mg total) by mouth daily. Swallow whole.   Biotin 08657 MCG Tbdp Take 10,000 mcg by mouth daily.   calcium carbonate 750 MG chewable tablet Commonly known as: TUMS EX Chew 1,500 mg by mouth daily as needed for heartburn.   clorazepate 3.75 MG tablet Commonly known as: TRANXENE Take one tablet daily as needed for anxiety/panic attacks.   EPINEPHrine 0.3 mg/0.3 mL Soaj injection Commonly known as: EpiPen 2-Pak Use as directed for life-threatening allergic reaction.   famotidine 40 MG tablet Commonly known as: PEPCID TAKE 1 TABLET BY MOUTH TWICE DAILY AS DIRECTED   Farxiga 10 MG Tabs tablet Generic drug: dapagliflozin propanediol Take 10 mg by mouth daily.   fluticasone 50 MCG/ACT nasal spray Commonly known as: FLONASE Place 2 sprays into both nostrils daily as needed for allergies or rhinitis.   folic acid 1 MG tablet Commonly known as: FOLVITE TAKE 1 TABLET BY MOUTH DAILY   gabapentin 300 MG capsule Commonly known as: NEURONTIN Take 1 capsule (300 mg total) by mouth 2 (two) times daily.   loratadine 10 MG tablet Commonly known as: CLARITIN TAKE 1 TABLET BY MOUTH ONCE DAILY AS NEEDED   Mounjaro 7.5 MG/0.5ML Pen Generic drug: tirzepatide Inject into the skin.   ondansetron 4 MG tablet Commonly known as: ZOFRAN TAKE 1 TABLET BY MOUTH EVERY 4 HOURS AS NEEDED FOR NAUSEA   potassium chloride 10 MEQ tablet Commonly known  asJerene Dilling Take 2 tablets (20 mEq total) by mouth 2 (two) times daily.   prochlorperazine 10 MG tablet Commonly known as: COMPAZINE TAKE 1 TABLET BY MOUTH EVERY 6 HOURS AS NEEDED FOR NAUSEA   rosuvastatin 20 MG tablet Commonly known as: CRESTOR TAKE 1 TABLET(20 MG) BY MOUTH DAILY   senna-docusate 8.6-50 MG tablet Commonly known as: Senokot-S Take 2 tablets by mouth daily.   topiramate 50 MG tablet Commonly known as: TOPAMAX Take 1 tablet (50 mg total) by mouth daily.   traMADol 50 MG tablet Commonly known  as: Ultram Take 1 tablet (50 mg total) by mouth 2 (two) times daily as needed.   UNABLE TO FIND Inject 1 each as directed See admin instructions. Allergy injections for cats, grass, and dust.   valsartan-hydrochlorothiazide 320-25 MG tablet Commonly known as: DIOVAN-HCT Take 1 tablet by mouth daily.   Vilazodone HCl 40 MG Tabs Commonly known as: VIIBRYD Take 1 tablet (40 mg total) by mouth daily. TAKE 1 TABLET(40 MG) BY MOUTH DAILY   Wixela Inhub 250-50 MCG/ACT Aepb Generic drug: fluticasone-salmeterol INHALE 1 PUFF BY MOUTH TWICE DAILY FOR COUGH OR WHEEZE. RINSE MOUTH AFTER USE    Past Medical History:  Diagnosis Date   Abnormal stress test: Anterior wall ischemia cardiac cath after that showing 25% RCA no LAD disease 02/12/2022   Acquired absence of left breast 08/18/2020   Allergic rhinitis 03/10/2015   Anxiety    Anxiety and depression 08/30/2021   Arthritis    Asthma    Bilateral carpal tunnel syndrome 08/25/2018   BMI 40.0-44.9, adult (HCC) 08/23/2019   Breast cancer (HCC) 2021   Carpal tunnel syndrome    Carpal tunnel syndrome    bilateral   Chronic kidney disease    stage 3   Depression    Diabetes (HCC)    Dyslipidemia 03/27/2022   Essential hypertension 02/12/2022   Fatigue 08/30/2021   Fibroid 05/04/2019   Fibroids, intramural 08/09/2019   GERD (gastroesophageal reflux disease)    Hemodynamic instability 03/10/2015   Hypertension    Idiopathic urticaria 03/10/2015   Iron deficiency anemia due to chronic blood loss 08/09/2019   Irritable bowel syndrome    Malignant neoplasm of female breast (HCC) 04/17/2022   Moderate persistent asthma 03/10/2015   Obesity    Pain in both hands 07/16/2018   PMB (postmenopausal bleeding) 04/17/2022   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   Severe anemia 12/14/2015   Type 2 diabetes mellitus without complication, without long-term current use of insulin (HCC) 02/12/2022    Past  Surgical History:  Procedure Laterality Date   caesarean     CESAREAN SECTION     CHOLECYSTECTOMY     DILATION AND CURETTAGE OF UTERUS     HIP SURGERY Left    LEFT HEART CATH AND CORONARY ANGIOGRAPHY N/A 02/13/2022   Procedure: LEFT HEART CATH AND CORONARY ANGIOGRAPHY;  Surgeon: Corky Crafts, MD;  Location: [facility];  Service: Cardiovascular;  Laterality: N/A;   MASTECTOMY     PORT A CATH INJECTION ([facility] HX)  2021    Review of systems negative except as noted in HPI / PMHx or noted below:  Review of Systems  Constitutional: Negative.   HENT: Negative.    Eyes: Negative.   Respiratory: Negative.    Cardiovascular: Negative.   Gastrointestinal: Negative.   Genitourinary: Negative.   Musculoskeletal: Negative.   Skin: Negative.   Neurological: Negative.   Endo/Heme/Allergies: Negative.  Psychiatric/Behavioral: Negative.       Objective:   Vitals:   06/26/23 1005  BP: 120/88  Pulse: 84  Resp: (!) 22  SpO2: 98%   Height: 5' 3.8" (162.1 cm)  Weight: 235 lb (106.6 kg)   Physical Exam Constitutional:      Appearance: She is not diaphoretic.  HENT:     Head: Normocephalic.     Right Ear: Tympanic membrane, ear canal and external ear normal.     Left Ear: Tympanic membrane, ear canal and external ear normal.     Nose: Nose normal. No mucosal edema or rhinorrhea.     Mouth/Throat:     Pharynx: Uvula midline. No oropharyngeal exudate.  Eyes:     Conjunctiva/sclera: Conjunctivae normal.  Neck:     Thyroid: No thyromegaly.     Trachea: Trachea normal. No tracheal tenderness or tracheal deviation.  Cardiovascular:     Rate and Rhythm: Normal rate and regular rhythm.     Heart sounds: Normal heart sounds, S1 normal and S2 normal. No murmur heard. Pulmonary:     Effort: No respiratory distress.     Breath sounds: Normal breath sounds. No stridor. No wheezing or rales.  Lymphadenopathy:     Head:     Right side of head: No tonsillar adenopathy.      Left side of head: No tonsillar adenopathy.     Cervical: No cervical adenopathy.  Skin:    Findings: No erythema or rash.     Nails: There is no clubbing.  Neurological:     Mental Status: She is alert.     Diagnostics: Spirometry was performed and demonstrated an FEV1 of 2.51 at 97 % of predicted.  Assessment and Plan:   1. Asthma, moderate persistent, well-controlled   2. LPRD (laryngopharyngeal reflux disease)   3. Allergic rhinitis, unspecified seasonality, unspecified trigger      1. Continue Wixela 250 - 1 inhalations 1-2 times per day    2. Continue Flonase - 1 spray each nostril 3-7 times per week  3. Continue famotidine 40 mg -1 tablet 2 times per day  4. Continue immunotherapy and EpiPen  5. If needed:   A. Airsupra - 2 inhalations every 6 hours (replaces albuterol)  B. OTC antihistamime  C. Nasal saline  6. Return to clinic in 6 months or earlier if problem  Jomary appears to be doing very well and other than having a flare of her reflux which may have actually affected her airway recently she has had very good control of her asthma and her allergic rhinitis and her LPR on her current medical therapy which includes anti-inflammatory medications for airway and consistent use of an H2 receptor blocker.  If she continues to do well I will see her back in this clinic in 6 months or earlier if there is a problem.  Laurie Schimke, MD Allergy / Immunology [facility]

## 2023-06-30 ENCOUNTER — Encounter: Payer: Self-pay | Admitting: Allergy and Immunology

## 2023-07-01 ENCOUNTER — Other Ambulatory Visit: Payer: Self-pay | Admitting: Allergy and Immunology

## 2023-07-01 DIAGNOSIS — R531 Weakness: Secondary | ICD-10-CM | POA: Diagnosis not present

## 2023-07-01 DIAGNOSIS — M2560 Stiffness of unspecified joint, not elsewhere classified: Secondary | ICD-10-CM | POA: Diagnosis not present

## 2023-07-01 DIAGNOSIS — M47816 Spondylosis without myelopathy or radiculopathy, lumbar region: Secondary | ICD-10-CM | POA: Diagnosis not present

## 2023-07-01 DIAGNOSIS — M25552 Pain in left hip: Secondary | ICD-10-CM | POA: Diagnosis not present

## 2023-07-03 DIAGNOSIS — R531 Weakness: Secondary | ICD-10-CM | POA: Diagnosis not present

## 2023-07-03 DIAGNOSIS — M47816 Spondylosis without myelopathy or radiculopathy, lumbar region: Secondary | ICD-10-CM | POA: Diagnosis not present

## 2023-07-03 DIAGNOSIS — M2560 Stiffness of unspecified joint, not elsewhere classified: Secondary | ICD-10-CM | POA: Diagnosis not present

## 2023-07-03 DIAGNOSIS — M25552 Pain in left hip: Secondary | ICD-10-CM | POA: Diagnosis not present

## 2023-07-10 DIAGNOSIS — I129 Hypertensive chronic kidney disease with stage 1 through stage 4 chronic kidney disease, or unspecified chronic kidney disease: Secondary | ICD-10-CM | POA: Diagnosis not present

## 2023-07-10 DIAGNOSIS — N1832 Chronic kidney disease, stage 3b: Secondary | ICD-10-CM | POA: Diagnosis not present

## 2023-07-10 DIAGNOSIS — N2581 Secondary hyperparathyroidism of renal origin: Secondary | ICD-10-CM | POA: Diagnosis not present

## 2023-07-10 DIAGNOSIS — D509 Iron deficiency anemia, unspecified: Secondary | ICD-10-CM | POA: Diagnosis not present

## 2023-07-10 DIAGNOSIS — N1831 Chronic kidney disease, stage 3a: Secondary | ICD-10-CM | POA: Diagnosis not present

## 2023-07-14 ENCOUNTER — Other Ambulatory Visit: Payer: Self-pay | Admitting: Nephrology

## 2023-07-14 DIAGNOSIS — N1832 Chronic kidney disease, stage 3b: Secondary | ICD-10-CM

## 2023-07-14 DIAGNOSIS — R531 Weakness: Secondary | ICD-10-CM | POA: Diagnosis not present

## 2023-07-14 DIAGNOSIS — M47816 Spondylosis without myelopathy or radiculopathy, lumbar region: Secondary | ICD-10-CM | POA: Diagnosis not present

## 2023-07-14 DIAGNOSIS — M25552 Pain in left hip: Secondary | ICD-10-CM | POA: Diagnosis not present

## 2023-07-14 DIAGNOSIS — M2560 Stiffness of unspecified joint, not elsewhere classified: Secondary | ICD-10-CM | POA: Diagnosis not present

## 2023-07-17 DIAGNOSIS — M47816 Spondylosis without myelopathy or radiculopathy, lumbar region: Secondary | ICD-10-CM | POA: Diagnosis not present

## 2023-07-17 DIAGNOSIS — R531 Weakness: Secondary | ICD-10-CM | POA: Diagnosis not present

## 2023-07-17 DIAGNOSIS — M25552 Pain in left hip: Secondary | ICD-10-CM | POA: Diagnosis not present

## 2023-07-17 DIAGNOSIS — R262 Difficulty in walking, not elsewhere classified: Secondary | ICD-10-CM | POA: Diagnosis not present

## 2023-07-17 DIAGNOSIS — N1832 Chronic kidney disease, stage 3b: Secondary | ICD-10-CM | POA: Diagnosis not present

## 2023-07-17 DIAGNOSIS — M2560 Stiffness of unspecified joint, not elsewhere classified: Secondary | ICD-10-CM | POA: Diagnosis not present

## 2023-07-18 ENCOUNTER — Other Ambulatory Visit: Payer: BC Managed Care – PPO

## 2023-07-21 ENCOUNTER — Ambulatory Visit: Payer: Medicare PPO | Admitting: Psychiatry

## 2023-07-22 DIAGNOSIS — M47816 Spondylosis without myelopathy or radiculopathy, lumbar region: Secondary | ICD-10-CM | POA: Diagnosis not present

## 2023-07-22 DIAGNOSIS — M2560 Stiffness of unspecified joint, not elsewhere classified: Secondary | ICD-10-CM | POA: Diagnosis not present

## 2023-07-22 DIAGNOSIS — M25552 Pain in left hip: Secondary | ICD-10-CM | POA: Diagnosis not present

## 2023-07-22 DIAGNOSIS — R531 Weakness: Secondary | ICD-10-CM | POA: Diagnosis not present

## 2023-07-24 ENCOUNTER — Ambulatory Visit (INDEPENDENT_AMBULATORY_CARE_PROVIDER_SITE_OTHER): Payer: BC Managed Care – PPO | Admitting: *Deleted

## 2023-07-24 DIAGNOSIS — R262 Difficulty in walking, not elsewhere classified: Secondary | ICD-10-CM | POA: Diagnosis not present

## 2023-07-24 DIAGNOSIS — M47816 Spondylosis without myelopathy or radiculopathy, lumbar region: Secondary | ICD-10-CM | POA: Diagnosis not present

## 2023-07-24 DIAGNOSIS — J309 Allergic rhinitis, unspecified: Secondary | ICD-10-CM

## 2023-07-24 DIAGNOSIS — R531 Weakness: Secondary | ICD-10-CM | POA: Diagnosis not present

## 2023-07-24 DIAGNOSIS — M2560 Stiffness of unspecified joint, not elsewhere classified: Secondary | ICD-10-CM | POA: Diagnosis not present

## 2023-07-24 DIAGNOSIS — M25552 Pain in left hip: Secondary | ICD-10-CM | POA: Diagnosis not present

## 2023-07-29 DIAGNOSIS — E1169 Type 2 diabetes mellitus with other specified complication: Secondary | ICD-10-CM | POA: Diagnosis not present

## 2023-07-29 DIAGNOSIS — I10 Essential (primary) hypertension: Secondary | ICD-10-CM | POA: Diagnosis not present

## 2023-07-29 DIAGNOSIS — E785 Hyperlipidemia, unspecified: Secondary | ICD-10-CM | POA: Diagnosis not present

## 2023-07-30 DIAGNOSIS — M2560 Stiffness of unspecified joint, not elsewhere classified: Secondary | ICD-10-CM | POA: Diagnosis not present

## 2023-07-30 DIAGNOSIS — M25552 Pain in left hip: Secondary | ICD-10-CM | POA: Diagnosis not present

## 2023-07-30 DIAGNOSIS — R531 Weakness: Secondary | ICD-10-CM | POA: Diagnosis not present

## 2023-07-30 DIAGNOSIS — M47816 Spondylosis without myelopathy or radiculopathy, lumbar region: Secondary | ICD-10-CM | POA: Diagnosis not present

## 2023-08-01 ENCOUNTER — Ambulatory Visit: Payer: Medicare PPO | Admitting: Psychiatry

## 2023-08-11 DIAGNOSIS — R531 Weakness: Secondary | ICD-10-CM | POA: Diagnosis not present

## 2023-08-11 DIAGNOSIS — R262 Difficulty in walking, not elsewhere classified: Secondary | ICD-10-CM | POA: Diagnosis not present

## 2023-08-11 DIAGNOSIS — M47816 Spondylosis without myelopathy or radiculopathy, lumbar region: Secondary | ICD-10-CM | POA: Diagnosis not present

## 2023-08-11 DIAGNOSIS — M2560 Stiffness of unspecified joint, not elsewhere classified: Secondary | ICD-10-CM | POA: Diagnosis not present

## 2023-08-11 DIAGNOSIS — M25552 Pain in left hip: Secondary | ICD-10-CM | POA: Diagnosis not present

## 2023-08-12 DIAGNOSIS — N1832 Chronic kidney disease, stage 3b: Secondary | ICD-10-CM | POA: Diagnosis not present

## 2023-08-12 DIAGNOSIS — E785 Hyperlipidemia, unspecified: Secondary | ICD-10-CM | POA: Diagnosis not present

## 2023-08-12 DIAGNOSIS — E1169 Type 2 diabetes mellitus with other specified complication: Secondary | ICD-10-CM | POA: Diagnosis not present

## 2023-08-12 DIAGNOSIS — Z6838 Body mass index (BMI) 38.0-38.9, adult: Secondary | ICD-10-CM | POA: Diagnosis not present

## 2023-08-12 DIAGNOSIS — I10 Essential (primary) hypertension: Secondary | ICD-10-CM | POA: Diagnosis not present

## 2023-08-13 DIAGNOSIS — M25552 Pain in left hip: Secondary | ICD-10-CM | POA: Diagnosis not present

## 2023-08-13 DIAGNOSIS — R262 Difficulty in walking, not elsewhere classified: Secondary | ICD-10-CM | POA: Diagnosis not present

## 2023-08-13 DIAGNOSIS — R531 Weakness: Secondary | ICD-10-CM | POA: Diagnosis not present

## 2023-08-13 DIAGNOSIS — M2560 Stiffness of unspecified joint, not elsewhere classified: Secondary | ICD-10-CM | POA: Diagnosis not present

## 2023-08-13 DIAGNOSIS — M47816 Spondylosis without myelopathy or radiculopathy, lumbar region: Secondary | ICD-10-CM | POA: Diagnosis not present

## 2023-08-18 DIAGNOSIS — M25552 Pain in left hip: Secondary | ICD-10-CM | POA: Diagnosis not present

## 2023-08-18 DIAGNOSIS — R531 Weakness: Secondary | ICD-10-CM | POA: Diagnosis not present

## 2023-08-18 DIAGNOSIS — M2560 Stiffness of unspecified joint, not elsewhere classified: Secondary | ICD-10-CM | POA: Diagnosis not present

## 2023-08-18 DIAGNOSIS — M47816 Spondylosis without myelopathy or radiculopathy, lumbar region: Secondary | ICD-10-CM | POA: Diagnosis not present

## 2023-08-20 ENCOUNTER — Ambulatory Visit (INDEPENDENT_AMBULATORY_CARE_PROVIDER_SITE_OTHER): Payer: Self-pay

## 2023-08-20 DIAGNOSIS — J309 Allergic rhinitis, unspecified: Secondary | ICD-10-CM

## 2023-08-20 DIAGNOSIS — M25552 Pain in left hip: Secondary | ICD-10-CM | POA: Diagnosis not present

## 2023-08-20 DIAGNOSIS — R531 Weakness: Secondary | ICD-10-CM | POA: Diagnosis not present

## 2023-08-20 DIAGNOSIS — M2560 Stiffness of unspecified joint, not elsewhere classified: Secondary | ICD-10-CM | POA: Diagnosis not present

## 2023-08-20 DIAGNOSIS — M47816 Spondylosis without myelopathy or radiculopathy, lumbar region: Secondary | ICD-10-CM | POA: Diagnosis not present

## 2023-08-20 DIAGNOSIS — R262 Difficulty in walking, not elsewhere classified: Secondary | ICD-10-CM | POA: Diagnosis not present

## 2023-08-25 DIAGNOSIS — R531 Weakness: Secondary | ICD-10-CM | POA: Diagnosis not present

## 2023-08-25 DIAGNOSIS — R262 Difficulty in walking, not elsewhere classified: Secondary | ICD-10-CM | POA: Diagnosis not present

## 2023-08-25 DIAGNOSIS — M25552 Pain in left hip: Secondary | ICD-10-CM | POA: Diagnosis not present

## 2023-08-25 DIAGNOSIS — M2560 Stiffness of unspecified joint, not elsewhere classified: Secondary | ICD-10-CM | POA: Diagnosis not present

## 2023-08-25 DIAGNOSIS — M47816 Spondylosis without myelopathy or radiculopathy, lumbar region: Secondary | ICD-10-CM | POA: Diagnosis not present

## 2023-08-26 ENCOUNTER — Ambulatory Visit: Payer: BC Managed Care – PPO | Admitting: Psychiatry

## 2023-08-26 DIAGNOSIS — F411 Generalized anxiety disorder: Secondary | ICD-10-CM | POA: Diagnosis not present

## 2023-08-26 NOTE — Progress Notes (Signed)
[facility] Counselor/Therapist Progress Note  Patient ID: LAYKEN BEG, MRN: [id number],    Date: 08/26/2023  Time Spent: 52 minutes   Treatment Type: Individual Therapy  Virtual Visit via Telehealth Note: MyChart Video session Connected with patient by a telemedicine/telehealth application, with their informed consent, and verified patient privacy and that I am speaking with the correct person using two identifiers. I discussed the limitations, risks, security and privacy concerns of performing psychotherapy and the availability of in person appointments. I also discussed with the patient that there may be a patient responsible charge related to this service. The patient expressed understanding and agreed to proceed. I discussed the treatment planning with the patient. The patient was provided an opportunity to ask questions and all were answered. The patient agreed with the plan and demonstrated an understanding of the instructions. The patient was advised to call  our office if  symptoms worsen or feel they are in a crisis state and need immediate contact.   Therapist Location: office Patient Location: home   Reported Symptoms: anxiety, decreased depression, tendency to see the negatives more   Mental Status Exam:  Appearance:   Casual     Behavior:  Appropriate, Sharing, and Motivated  Motor:  Normal  Speech/Language:   Clear and Coherent  Affect:  anxious  Mood:  anxious  Thought process:  goal directed  Thought content:    Rumination and it's mostly about issues with kids  Sensory/Perceptual disturbances:    WNL  Orientation:  oriented to person, place, time/date, situation, day of week, month of year, year, and stated date of Feb. 11, 2025  Attention:  Fair  Concentration:  Good and Fair  Memory:  WNL  Fund of knowledge:   Good  Insight:    Good  Judgment:   Good  Impulse Control:  Good   Risk Assessment: Danger to Self:  No Self-injurious Behavior: No Danger to  Others: No Duty to Warn:no Physical Aggression / Violence:No  Access to Firearms a concern: No  Gang Involvement:No   Subjective:  Patient today in mychart video session reporting anxiety attacks more recently especially when I have to do things that I don't want to do. I do "force myself to get up anyway." Feels that her pain is limiting her mobility and being able to do certain tasks. Daughter, 71,  moved in with BF and patient not happy with that but realizing it's out of her control. Ongoing issues at home with their almost 36 yr old son re: challenging and pushing parent rules, Son started Drivers Ed recently. Report some difficulty with concentration. Sleep is usually good except occasional waking up. Sees her biggest challenge currently as being the challenges in relationship with kids and her own physical/mental health issues. Some worry and ruminating. Anxiety often "comes and goes". States her energy level is lower right now. Has reported she is trying to lose some weight. Encouraged exercise at home as she is able and recommended by her PT. Discussed some age-appropriate issues patient is having with son.  (Not all details included in this note due to patient privacy needs).  Feels that her daughter has "separated" herself from the family after going to live with BF.  Discussed further some different ways of communicating and managing difficult times with her teenage son, using actual examples in our discussion to help patient better understand.  Patient is encouraged to continue coaching herself along when she becomes really anxious and trying to  use some calming techniques including deep breathing exercises.  Also encouraged her to pay as much attention to the things that "do go well" as she does to the negatives, using a specific example with patient. Supported her coaching herself through difficult times and conversations with family members especially her children ages 3 an 26. Some  hopefulness at times but easily discouraged at other times, and encouraged again towards end of session for her to follow through on some of the strategies we discussed today.  Interventions: Cognitive Behavioral Therapy and Ego-Supportive  Long-term goal: Develop healthy cognitive patterns and beliefs about self and the world that lead to alleviation and help prevent the relapse of depressive symptoms Short-term goal: Replace negative self-defeating self-talk with verbalization of realistic and positive cognitive messages. Strategies: Reinforce positive, reality-based cognitive messages that enhance self-confidence and increase adaptive action.   Diagnosis:   ICD-10-CM   1. Generalized anxiety disorder  F41.1      Plan: Patient today in session participating in working on her depression and anxiety, and did report that her depression has decreased some.  Still having volatile relationships with her children ages 41 and 40 which is a significant stressor for her.  Showed good motivation in session today but it seems that motivation is difficult at times to hold onto.  Those times are typically in relation to her relationships with her children.  Reminded and encouraged patient in her practice of more positive and self affirming behaviors as noted in sessions including: Stay in the present focused on what she can change her control, stay in touch with people who are supportive of her, refrain from assuming that things are always going to go in a negative direction, get outside some each day as weather allows, healthy nutrition and exercise as she is able, positive and encouraging self talk, challenge and counteract her self-doubt, reduce her overthinking and over analyzing, and realize the strength she shows when working with goal-directed behaviors moving in a direction that supports her overall improved health and her outlook into the future.  Patient has made some progress and needs to continue  her work with goal-directed behaviors in order to move further in a positive and more hopeful direction.  Goal review and progress/challenges noted with patient.  Next appointment within 3 to 4 weeks.   Mathis Fare, LCSW

## 2023-08-27 DIAGNOSIS — R531 Weakness: Secondary | ICD-10-CM | POA: Diagnosis not present

## 2023-08-27 DIAGNOSIS — M2560 Stiffness of unspecified joint, not elsewhere classified: Secondary | ICD-10-CM | POA: Diagnosis not present

## 2023-08-27 DIAGNOSIS — R262 Difficulty in walking, not elsewhere classified: Secondary | ICD-10-CM | POA: Diagnosis not present

## 2023-08-27 DIAGNOSIS — M47816 Spondylosis without myelopathy or radiculopathy, lumbar region: Secondary | ICD-10-CM | POA: Diagnosis not present

## 2023-08-27 DIAGNOSIS — M25552 Pain in left hip: Secondary | ICD-10-CM | POA: Diagnosis not present

## 2023-09-01 DIAGNOSIS — R262 Difficulty in walking, not elsewhere classified: Secondary | ICD-10-CM | POA: Diagnosis not present

## 2023-09-01 DIAGNOSIS — M47816 Spondylosis without myelopathy or radiculopathy, lumbar region: Secondary | ICD-10-CM | POA: Diagnosis not present

## 2023-09-01 DIAGNOSIS — M25552 Pain in left hip: Secondary | ICD-10-CM | POA: Diagnosis not present

## 2023-09-01 DIAGNOSIS — R531 Weakness: Secondary | ICD-10-CM | POA: Diagnosis not present

## 2023-09-01 DIAGNOSIS — M2560 Stiffness of unspecified joint, not elsewhere classified: Secondary | ICD-10-CM | POA: Diagnosis not present

## 2023-09-08 DIAGNOSIS — M25552 Pain in left hip: Secondary | ICD-10-CM | POA: Diagnosis not present

## 2023-09-08 DIAGNOSIS — R531 Weakness: Secondary | ICD-10-CM | POA: Diagnosis not present

## 2023-09-08 DIAGNOSIS — R262 Difficulty in walking, not elsewhere classified: Secondary | ICD-10-CM | POA: Diagnosis not present

## 2023-09-08 DIAGNOSIS — M2560 Stiffness of unspecified joint, not elsewhere classified: Secondary | ICD-10-CM | POA: Diagnosis not present

## 2023-09-08 DIAGNOSIS — M47816 Spondylosis without myelopathy or radiculopathy, lumbar region: Secondary | ICD-10-CM | POA: Diagnosis not present

## 2023-09-10 ENCOUNTER — Encounter: Payer: Self-pay | Admitting: Physical Medicine and Rehabilitation

## 2023-09-10 DIAGNOSIS — R262 Difficulty in walking, not elsewhere classified: Secondary | ICD-10-CM | POA: Diagnosis not present

## 2023-09-10 DIAGNOSIS — R531 Weakness: Secondary | ICD-10-CM | POA: Diagnosis not present

## 2023-09-10 DIAGNOSIS — M47816 Spondylosis without myelopathy or radiculopathy, lumbar region: Secondary | ICD-10-CM | POA: Diagnosis not present

## 2023-09-10 DIAGNOSIS — M2560 Stiffness of unspecified joint, not elsewhere classified: Secondary | ICD-10-CM | POA: Diagnosis not present

## 2023-09-10 DIAGNOSIS — M25552 Pain in left hip: Secondary | ICD-10-CM | POA: Diagnosis not present

## 2023-09-14 ENCOUNTER — Other Ambulatory Visit: Payer: Self-pay | Admitting: Allergy and Immunology

## 2023-09-15 ENCOUNTER — Encounter: Payer: BC Managed Care – PPO | Attending: Physical Medicine & Rehabilitation | Admitting: Registered Nurse

## 2023-09-15 VITALS — BP 115/88 | HR 86 | Ht 63.75 in | Wt 238.0 lb

## 2023-09-15 DIAGNOSIS — M1612 Unilateral primary osteoarthritis, left hip: Secondary | ICD-10-CM | POA: Insufficient documentation

## 2023-09-15 DIAGNOSIS — M2170 Unequal limb length (acquired), unspecified site: Secondary | ICD-10-CM | POA: Insufficient documentation

## 2023-09-15 DIAGNOSIS — Z79899 Other long term (current) drug therapy: Secondary | ICD-10-CM | POA: Diagnosis not present

## 2023-09-15 DIAGNOSIS — G894 Chronic pain syndrome: Secondary | ICD-10-CM | POA: Insufficient documentation

## 2023-09-15 DIAGNOSIS — Z79891 Long term (current) use of opiate analgesic: Secondary | ICD-10-CM | POA: Insufficient documentation

## 2023-09-15 DIAGNOSIS — M47816 Spondylosis without myelopathy or radiculopathy, lumbar region: Secondary | ICD-10-CM | POA: Insufficient documentation

## 2023-09-15 NOTE — Progress Notes (Signed)
 Subjective:    Patient ID: Laurie Simmons Simmons, female    DOB: 1965-09-03, 58 y.o.   MRN: [id number]  HPI: Laurie Simmons Simmons is a 58 y.o. female who returns for follow up appointment for chronic pain and medication refill. She states  her pain is located in her lower back and left hip pain. She rates her pain 4. Her current exercise regime is walking and performing stretching exercises.  [name] Morphine equivalent is 20.00 MME. She is also prescribed Clorazepate by  [facility], ANP  .We have discussed the black box warning of using opioids and benzodiazepines. I highlighted the dangers of using these drugs together and discussed the adverse events including respiratory suppression, overdose, cognitive impairment and importance of compliance with current regimen. We will continue to monitor and adjust as indicated.  she is being closely monitored and under the care of her psychiatrist.    Pain Inventory Average Pain 5 Pain Right Now 4 My pain is aching  In the last 24 hours, has pain interfered with the following? General activity 3 Relation with others 0 Enjoyment of life 5 What TIME of day is your pain at its worst? varies Sleep (in general) Good  Pain is worse with: some activites and moving around Pain improves with: rest Relief from Meds:  .  Family History  Problem Relation Age of Onset   Kidney disease Mother    Pulmonary fibrosis Father    Cancer Maternal Grandmother        carcinoma of the vulva and possible ovarian cancer   Prostate cancer Maternal Grandfather    Lung cancer Paternal Grandfather        heavy smoker   Social History   Socioeconomic History   Marital status: Married    Spouse name: Phil   Number of children: 2   Years of education: Not on file   Highest education level: Not on file  Occupational History   Not on file  Tobacco Use   Smoking status: Never   Smokeless tobacco: Never  Vaping Use   Vaping status: Never Used  Substance and Sexual Activity    Alcohol use: No   Drug use: No   Sexual activity: Yes  Other Topics Concern   Not on file  Social History Narrative   Not on file   Social Drivers of Health   Financial Resource Strain: Not on file  Food Insecurity: Not on file  Transportation Needs: Not on file  Physical Activity: Not on file  Stress: Not on file  Social Connections: Not on file   Past Surgical History:  Procedure Laterality Date   caesarean     CESAREAN SECTION     CHOLECYSTECTOMY     DILATION AND CURETTAGE OF UTERUS     HIP SURGERY Left    LEFT HEART CATH AND CORONARY ANGIOGRAPHY N/A 02/13/2022   Procedure: LEFT HEART CATH AND CORONARY ANGIOGRAPHY;  Surgeon: Corky Crafts, MD;  Location: [facility];  Service: Cardiovascular;  Laterality: N/A;   MASTECTOMY     PORT A CATH INJECTION ([facility] HX)  2021   Past Surgical History:  Procedure Laterality Date   caesarean     CESAREAN SECTION     CHOLECYSTECTOMY     DILATION AND CURETTAGE OF UTERUS     HIP SURGERY Left    LEFT HEART CATH AND CORONARY ANGIOGRAPHY N/A 02/13/2022   Procedure: LEFT HEART CATH AND CORONARY ANGIOGRAPHY;  Surgeon: Corky Crafts, MD;  Location: [facility];  Service: Cardiovascular;  Laterality: N/A;   MASTECTOMY     PORT A CATH INJECTION ([facility] HX)  2021   Past Medical History:  Diagnosis Date   Abnormal stress test: Anterior wall ischemia cardiac cath after that showing 25% RCA no LAD disease 02/12/2022   Acquired absence of left breast 08/18/2020   Allergic rhinitis 03/10/2015   Anxiety    Anxiety and depression 08/30/2021   Arthritis    Asthma    Bilateral carpal tunnel syndrome 08/25/2018   BMI 40.0-44.9, adult (HCC) 08/23/2019   Breast cancer (HCC) 2021   Carpal tunnel syndrome    Carpal tunnel syndrome    bilateral   Chronic kidney disease    stage 3   Depression    Diabetes (HCC)    Dyslipidemia 03/27/2022   Essential hypertension 02/12/2022   Fatigue 08/30/2021   Fibroid 05/04/2019    Fibroids, intramural 08/09/2019   GERD (gastroesophageal reflux disease)    Hemodynamic instability 03/10/2015   Hypertension    Idiopathic urticaria 03/10/2015   Iron deficiency anemia due to chronic blood loss 08/09/2019   Irritable bowel syndrome    Malignant neoplasm of female breast (HCC) 04/17/2022   Moderate persistent asthma 03/10/2015   Obesity    Pain in both hands 07/16/2018   PMB (postmenopausal bleeding) 04/17/2022   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   Severe anemia 12/14/2015   Type 2 diabetes mellitus without complication, without long-term current use of insulin (HCC) 02/12/2022   BP 115/88   Pulse 86   Ht 5' 3.75" (1.619 m)   Wt 238 lb (108 kg)   LMP 01/23/2011   SpO2 94%   BMI 41.17 kg/m   Opioid Risk Score:   Fall Risk Score:  `1  Depression screen [facility] 2/9     05/02/2023   12:46 PM 03/21/2023   10:13 AM 02/19/2023   11:26 AM 01/15/2023   10:40 AM  Depression screen PHQ 2/9  Decreased Interest 0 0 0 1  Down, Depressed, Hopeless 0 0 0 1  PHQ - 2 Score 0 0 0 2  Altered sleeping    1  Tired, decreased energy    1  Change in appetite    0  Feeling bad or failure about yourself     0  Trouble concentrating    1  Moving slowly or fidgety/restless    0  Suicidal thoughts    0  PHQ-9 Score    5     Review of Systems  Musculoskeletal:  Positive for back pain.       Left hip pain  All other systems reviewed and are negative.      Objective:   Physical Exam Vitals and nursing note reviewed.  Constitutional:      Appearance: Normal appearance.  Cardiovascular:     Rate and Rhythm: Normal rate and regular rhythm.     Pulses: Normal pulses.     Heart sounds: Normal heart sounds.  Pulmonary:     Effort: Pulmonary effort is normal.     Breath sounds: Normal breath sounds.  Musculoskeletal:     Comments: Normal Muscle Bulk and Muscle Testing Reveals:  Upper Extremities: Full ROM and Muscle Strength 5/5  Lower  Extremities: Full ROM and Muscle Strength 5/5 Left Lower Extremity Flexion Produces Pain into her Groin      Skin:    General: Skin is warm and dry.  Neurological:  Mental Status: She is alert and oriented to person, place, and time.  Psychiatric:        Mood and Affect: Mood normal.        Behavior: Behavior normal.         Assessment & Plan:  Spondylosis without Myelopathy/ Lumbar Facet Arthropathy: Continue HEP as Tolerated. Continue to Monitor.  Arthritis of Left Hip: Continue HEP as Tolerated. Continue current medication regimen. Continue to Monitor.  Acquired leg length discrepancy: Continue to Monitor. .  Chronic Pain Syndrome: Continue Tramadol 50 mg twice a day as needed for pain #60. We will continue the opioid monitoring program, this consists of regular clinic visits, examinations, urine drug screen, pill counts as well as use of [place] Controlled Substance Reporting system. A 12 month History has been reviewed on the [place] Controlled Substance Reporting System on 09/19/2023. F/U 6 months

## 2023-09-16 ENCOUNTER — Ambulatory Visit (INDEPENDENT_AMBULATORY_CARE_PROVIDER_SITE_OTHER): Payer: Self-pay

## 2023-09-16 DIAGNOSIS — M25552 Pain in left hip: Secondary | ICD-10-CM | POA: Diagnosis not present

## 2023-09-16 DIAGNOSIS — J309 Allergic rhinitis, unspecified: Secondary | ICD-10-CM | POA: Diagnosis not present

## 2023-09-16 DIAGNOSIS — M47816 Spondylosis without myelopathy or radiculopathy, lumbar region: Secondary | ICD-10-CM | POA: Diagnosis not present

## 2023-09-16 DIAGNOSIS — R262 Difficulty in walking, not elsewhere classified: Secondary | ICD-10-CM | POA: Diagnosis not present

## 2023-09-16 DIAGNOSIS — M2560 Stiffness of unspecified joint, not elsewhere classified: Secondary | ICD-10-CM | POA: Diagnosis not present

## 2023-09-16 DIAGNOSIS — R531 Weakness: Secondary | ICD-10-CM | POA: Diagnosis not present

## 2023-09-19 ENCOUNTER — Encounter: Payer: Self-pay | Admitting: Registered Nurse

## 2023-09-22 ENCOUNTER — Encounter: Payer: Self-pay | Admitting: Adult Health

## 2023-09-22 ENCOUNTER — Telehealth: Payer: BC Managed Care – PPO | Admitting: Adult Health

## 2023-09-22 DIAGNOSIS — G4700 Insomnia, unspecified: Secondary | ICD-10-CM | POA: Diagnosis not present

## 2023-09-22 DIAGNOSIS — F411 Generalized anxiety disorder: Secondary | ICD-10-CM

## 2023-09-22 DIAGNOSIS — F410 Panic disorder [episodic paroxysmal anxiety] without agoraphobia: Secondary | ICD-10-CM

## 2023-09-22 DIAGNOSIS — F39 Unspecified mood [affective] disorder: Secondary | ICD-10-CM

## 2023-09-22 DIAGNOSIS — F331 Major depressive disorder, recurrent, moderate: Secondary | ICD-10-CM

## 2023-09-22 MED ORDER — CARIPRAZINE HCL 1.5 MG PO CAPS: 1.5000 mg | ORAL_CAPSULE | Freq: Every day | ORAL | 2 refills | Status: DC

## 2023-09-22 MED ORDER — CLORAZEPATE DIPOTASSIUM 3.75 MG PO TABS: ORAL_TABLET | ORAL | 2 refills | Status: DC

## 2023-09-22 NOTE — Progress Notes (Signed)
 Laurie Simmons [id number] Aug 09, 1965 58 y.o.  Virtual Visit via Video Note  I connected with pt @ on 09/22/23 at  9:30 AM EDT by a video enabled telemedicine application and verified that I am speaking with the correct person using two identifiers.   I discussed the limitations of evaluation and management by telemedicine and the availability of in person appointments. The patient expressed understanding and agreed to proceed.  I discussed the assessment and treatment plan with the patient. The patient was provided an opportunity to ask questions and all were answered. The patient agreed with the plan and demonstrated an understanding of the instructions.   The patient was advised to call back or seek an in-person evaluation if the symptoms worsen or if the condition fails to improve as anticipated.  I provided 25 minutes of non-face-to-face time during this encounter.  The patient was located at home.  The provider was located at [facility].   Dorothyann Gibbs, NP   Subjective:   Patient ID:  Laurie Simmons is a 58 y.o. (DOB April 27, 1966) female.  Chief Complaint: No chief complaint on file.   HPI Laurie Simmons presents for follow-up of anxiety, panic attacks, depression, insomnia.  Describes mood today as "ok". Pleasant. Denies tearfulness. Mood symptoms - reports some situational depression - "feeling kind of droopy and down lately". Reports lower interest and motivation. Reports increased situational stressors - family. Reports increased anxiety. Denies irritability. Denies panic attacks. Reports feeling overwhelmed. Reports some worry, rumination and over thinking. Mood is variable. Stating "I haven't been doing too good". Feels like current medications are helpful, but is willing to consider other. Taking medications as prescribed.  Energy levels lower. Active, does not have a regular exercise routine. Going to P/T twice a week.  Enjoys some usual interests and activities. Married.  Lives with husband and 2 children. Appetite adequate. Reports weight fluctuations - 5 pounds. Sleeping well most nights. Averages 7 hours. Reports some daytime napping. Focus and concentration difficulties - "not very good". Completing tasks. Managing aspects of household. Retired. Denies AH or VH. Denies SI or HI. Denies self-harm. Denies substance use.  Therapist - Rockne Menghini.  Previous medications: Abilify  Review of Systems:  Review of Systems  Musculoskeletal:  Negative for gait problem.  Neurological:  Negative for tremors.  Psychiatric/Behavioral:         Please refer to HPI    Medications: I have reviewed the patient's current medications.  Current Outpatient Medications  Medication Sig Dispense Refill   cariprazine (VRAYLAR) 1.5 MG capsule Take 1 capsule (1.5 mg total) by mouth daily. 30 capsule 2   AIRSUPRA 90-80 MCG/ACT AERO Inhale 2 puffs into the lungs as needed (every 4 to 6 hours for cough, wheeze, shortness of breath.  Rinse, mouth after use). 10.7 g 1   anastrozole (ARIMIDEX) 1 MG tablet TAKE 1 TABLET(1 MG) BY MOUTH DAILY 90 tablet 3   aspirin EC 81 MG tablet Take 1 tablet (81 mg total) by mouth daily. Swallow whole. 90 tablet 3   Biotin 40981 MCG TBDP Take 10,000 mcg by mouth daily.     calcium carbonate (TUMS EX) 750 MG chewable tablet Chew 1,500 mg by mouth daily as needed for heartburn.     clorazepate (TRANXENE) 3.75 MG tablet Take one tablet daily as needed for anxiety/panic attacks. 30 tablet 2   EPINEPHrine (EPIPEN 2-PAK) 0.3 mg/0.3 mL IJ SOAJ injection Use as directed for life-threatening allergic reaction. (Patient taking differently: Inject 0.3  mg into the muscle as needed for anaphylaxis. Use as directed for life-threatening allergic reaction.) 1 each 3   famotidine (PEPCID) 40 MG tablet TAKE 1 TABLET BY MOUTH TWICE DAILY AS DIRECTED 60 tablet 5   FARXIGA 10 MG TABS tablet Take 10 mg by mouth daily.     fluticasone (FLONASE) 50 MCG/ACT nasal spray  Place 2 sprays into both nostrils daily as needed for allergies or rhinitis.     folic acid (FOLVITE) 1 MG tablet TAKE 1 TABLET BY MOUTH DAILY 30 tablet 5   gabapentin (NEURONTIN) 300 MG capsule Take 1 capsule (300 mg total) by mouth 2 (two) times daily. (Patient taking differently: Take 300 mg by mouth at bedtime.) 60 capsule 3   loratadine (CLARITIN) 10 MG tablet TAKE 1 TABLET BY MOUTH ONCE DAILY AS NEEDED 90 tablet 1   MOUNJARO 7.5 MG/0.5ML Pen Inject into the skin.     ondansetron (ZOFRAN) 4 MG tablet TAKE 1 TABLET BY MOUTH EVERY 4 HOURS AS NEEDED FOR NAUSEA (Patient taking differently: Take 4 mg by mouth every 4 (four) hours as needed for nausea or vomiting.) 30 tablet 0   potassium chloride (KLOR-CON M) 10 MEQ tablet Take 2 tablets (20 mEq total) by mouth 2 (two) times daily. 120 tablet 5   prochlorperazine (COMPAZINE) 10 MG tablet TAKE 1 TABLET BY MOUTH EVERY 6 HOURS AS NEEDED FOR NAUSEA (Patient taking differently: Take 10 mg by mouth every 6 (six) hours as needed for nausea or vomiting.) 90 tablet 1   rosuvastatin (CRESTOR) 20 MG tablet TAKE 1 TABLET(20 MG) BY MOUTH DAILY 90 tablet 2   senna-docusate (SENOKOT-S) 8.6-50 MG tablet Take 2 tablets by mouth daily.     topiramate (TOPAMAX) 50 MG tablet Take 1 tablet (50 mg total) by mouth daily. 90 tablet 3   UNABLE TO FIND Inject 1 each as directed See admin instructions. Allergy injections for cats, grass, and dust.     valsartan-hydrochlorothiazide (DIOVAN-HCT) 320-25 MG tablet Take 1 tablet by mouth daily.  0   Vilazodone HCl (VIIBRYD) 40 MG TABS Take 1 tablet (40 mg total) by mouth daily. TAKE 1 TABLET(40 MG) BY MOUTH DAILY 90 tablet 3   WIXELA INHUB 250-50 MCG/ACT AEPB INHALE 1 PUFF BY MOUTH TWICE DAILY FOR COUGH OR WHEEZE. RINSE MOUTH AFTER USE 60 each 1   No current facility-administered medications for this visit.    Medication Side Effects: None  Allergies:  Allergies  Allergen Reactions   Effexor [Venlafaxine] Palpitations    Nsaids Other (See Comments)    Pt reports kidney disease  Pt reports kidney disease, Pt reports kidney disease    Past Medical History:  Diagnosis Date   Abnormal stress test: Anterior wall ischemia cardiac cath after that showing 25% RCA no LAD disease 02/12/2022   Acquired absence of left breast 08/18/2020   Allergic rhinitis 03/10/2015   Anxiety    Anxiety and depression 08/30/2021   Arthritis    Asthma    Bilateral carpal tunnel syndrome 08/25/2018   BMI 40.0-44.9, adult (HCC) 08/23/2019   Breast cancer (HCC) 2021   Carpal tunnel syndrome    Carpal tunnel syndrome    bilateral   Chronic kidney disease    stage 3   Depression    Diabetes (HCC)    Dyslipidemia 03/27/2022   Essential hypertension 02/12/2022   Fatigue 08/30/2021   Fibroid 05/04/2019   Fibroids, intramural 08/09/2019   GERD (gastroesophageal reflux disease)    Hemodynamic instability 03/10/2015  Hypertension    Idiopathic urticaria 03/10/2015   Iron deficiency anemia due to chronic blood loss 08/09/2019   Irritable bowel syndrome    Malignant neoplasm of female breast (HCC) 04/17/2022   Moderate persistent asthma 03/10/2015   Obesity    Pain in both hands 07/16/2018   PMB (postmenopausal bleeding) 04/17/2022   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   Severe anemia 12/14/2015   Type 2 diabetes mellitus without complication, without long-term current use of insulin (HCC) 02/12/2022    Family History  Problem Relation Age of Onset   Kidney disease Mother    Pulmonary fibrosis Father    Cancer Maternal Grandmother        carcinoma of the vulva and possible ovarian cancer   Prostate cancer Maternal Grandfather    Lung cancer Paternal Grandfather        heavy smoker    Social History   Socioeconomic History   Marital status: Married    Spouse name: Phil   Number of children: 2   Years of education: Not on file   Highest education level: Not on file   Occupational History   Not on file  Tobacco Use   Smoking status: Never   Smokeless tobacco: Never  Vaping Use   Vaping status: Never Used  Substance and Sexual Activity   Alcohol use: No   Drug use: No   Sexual activity: Yes  Other Topics Concern   Not on file  Social History Narrative   Not on file   Social Drivers of Health   Financial Resource Strain: Not on file  Food Insecurity: Not on file  Transportation Needs: Not on file  Physical Activity: Not on file  Stress: Not on file  Social Connections: Not on file  Intimate Partner Violence: Not on file    Past Medical History, Surgical history, Social history, and Family history were reviewed and updated as appropriate.   Please see review of systems for further details on the patient's review from today.   Objective:   Physical Exam:  LMP 01/23/2011   Physical Exam Constitutional:      General: She is not in acute distress. Musculoskeletal:        General: No deformity.  Neurological:     Mental Status: She is alert and oriented to person, place, and time.     Coordination: Coordination normal.  Psychiatric:        Attention and Perception: Attention and perception normal. She does not perceive auditory or visual hallucinations.        Mood and Affect: Affect is not labile, blunt, angry or inappropriate.        Speech: Speech normal.        Behavior: Behavior normal.        Thought Content: Thought content normal. Thought content is not paranoid or delusional. Thought content does not include homicidal or suicidal ideation. Thought content does not include homicidal or suicidal plan.        Cognition and Memory: Cognition and memory normal.        Judgment: Judgment normal.     Comments: Insight intact     Lab Review:     Component Value Date/Time   NA 135 03/28/2023 1554   NA 137 02/12/2022 1035   K 2.9 (L) 03/28/2023 1554   CL 97 (L) 03/28/2023 1554   CO2 26 03/28/2023 1554   GLUCOSE 96  03/28/2023 1554   BUN 16 03/28/2023 1554  BUN 16 02/12/2022 1035   CREATININE 1.41 (H) 03/28/2023 1554   CALCIUM 9.6 03/28/2023 1554   PROT 8.2 (H) 03/28/2023 1554   ALBUMIN 4.4 03/28/2023 1554   AST 17 03/28/2023 1554   ALT 19 03/28/2023 1554   ALKPHOS 75 03/28/2023 1554   BILITOT 0.6 03/28/2023 1554   GFRNONAA 44 (L) 03/28/2023 1554   GFRAA  10/20/2008 2105    >60        The eGFR has been calculated using the MDRD equation. This calculation has not been validated in all clinical situations. eGFR's persistently <60 mL/min signify possible Chronic Kidney Disease.       Component Value Date/Time   WBC 9.3 03/28/2023 1554   WBC 12.3 (H) 10/22/2008 0530   RBC 5.39 (H) 03/28/2023 1554   HGB 14.8 03/28/2023 1554   HGB 15.0 02/12/2022 1035   HCT 45.2 03/28/2023 1554   HCT 43.9 02/12/2022 1035   PLT 349 03/28/2023 1554   PLT 329 02/12/2022 1035   MCV 83.9 03/28/2023 1554   MCV 85 02/12/2022 1035   MCV 87 11/07/2020 0000   MCH 27.5 03/28/2023 1554   MCHC 32.7 03/28/2023 1554   RDW 14.0 03/28/2023 1554   RDW 13.1 02/12/2022 1035   LYMPHSABS 2.5 03/28/2023 1554   LYMPHSABS 1.6 02/12/2022 1035   MONOABS 0.7 03/28/2023 1554   EOSABS 0.3 03/28/2023 1554   EOSABS 0.2 02/12/2022 1035   BASOSABS 0.1 03/28/2023 1554   BASOSABS 0.1 02/12/2022 1035    No results found for: "POCLITH", "LITHIUM"   No results found for: "PHENYTOIN", "PHENOBARB", "VALPROATE", "CBMZ"   .res Assessment: Plan:   Plan:  1. Viibryd 40mg  daily 2. Tranxene 3.75mg  once daily 3. Topamax 50 mg daily  D/C - Abilify 4mg  daily Add Vraylar 1.5mg  daily  RTC 3 months  25 minutes spent dedicated to the care of this patient on the date of this encounter to include pre-visit review of records, ordering of medication, post visit documentation, and face-to-face time with the patient discussing depression, anxiety and OCD. Discussed changing current medication regimen.  Patient advised to contact office  with any questions, adverse effects, or acute worsening in signs and symptoms.  Discussed potential benefits, risk, and side effects of benzodiazepines to include potential risk of tolerance and dependence, as well as possible drowsiness. Advised patient not to drive if experiencing drowsiness and to take lowest possible effective dose to minimize risk of dependence and tolerance.  Discussed potential metabolic side effects associated with atypical antipsychotics, as well as potential risk for movement side effects. Advised pt to contact office if movement side effects occur.    Diagnoses and all orders for this visit:  Major depressive disorder, recurrent episode, moderate (HCC) -     cariprazine (VRAYLAR) 1.5 MG capsule; Take 1 capsule (1.5 mg total) by mouth daily.  Generalized anxiety disorder -     clorazepate (TRANXENE) 3.75 MG tablet; Take one tablet daily as needed for anxiety/panic attacks.  Panic attacks -     clorazepate (TRANXENE) 3.75 MG tablet; Take one tablet daily as needed for anxiety/panic attacks.  Insomnia, unspecified type  Episodic mood disorder (HCC)     Please see After Visit Summary for patient specific instructions.  Future Appointments  Date Time Provider Department Center  09/23/2023 10:00 AM Mathis Fare, LCSW [facility] None  09/26/2023  3:30 PM [facility] None  09/26/2023  4:00 PM Dellia Beckwith, MD [facility] None  09/29/2023  1:20 PM Georgeanna Lea, MD [facility]  None  12/25/2023 10:20 AM Kozlow, Alvira Philips, MD [facility] None  03/17/2024 10:00 AM Jones Bales, NP [facility]    No orders of the defined types were placed in this encounter.     -------------------------------

## 2023-09-23 ENCOUNTER — Ambulatory Visit (INDEPENDENT_AMBULATORY_CARE_PROVIDER_SITE_OTHER): Payer: Medicare PPO | Admitting: Psychiatry

## 2023-09-23 DIAGNOSIS — F411 Generalized anxiety disorder: Secondary | ICD-10-CM

## 2023-09-23 NOTE — Progress Notes (Signed)
 [facility] Counselor/Therapist Progress Note  Patient ID: JOSY PEADEN, MRN: [id number],    Date: 09/23/2023  Time Spent: 50 minutes   Treatment Type: Individual Therapy  Reported Symptoms: anxiety, decreased depression, decreasing her tendency to see the negatives more   Mental Status Exam:  Appearance:   Casual     Behavior:  Appropriate, Sharing, and Motivated  Motor:  Normal  Speech/Language:   Clear and Coherent  Affect:  anxious  Mood:  anxious and depressed  Thought process:  goal directed  Thought content:    Rumination  Sensory/Perceptual disturbances:    WNL  Orientation:  oriented to person, place, time/date, situation, day of week, month of year, year, and stated date of September 23, 2023  Attention:  Good  Concentration:  Good  Memory:  WNL  Fund of knowledge:   Good  Insight:    Good  Judgment:   Good  Impulse Control:  Good   Risk Assessment: Danger to Self:  No Self-injurious Behavior: No Danger to Others: No Duty to Warn:no Physical Aggression / Violence:No  Access to Firearms a concern: No  Gang Involvement:No   Subjective:    Patient in session today reporting anxiety (not anxiety attacks), depression, and trying to continue decreasing her tendency to see the negatives versus positives. "My brain wants to be more active but I end up not doing as much as I'd like." Maybe my overthinking gets in the way and tires.  Working further today on her overthinking and her relationship with mother who is having increasing memory problems per patient report. Mom driving less, due to memory issues. Patient frustrated, sad, and some depression as she processed more today her depression, sadness, and overthinking re: her mother who is "starting to have more memory issues." Shared some of her concerns about mom and we did some role-play in our talking/processing patient's concerns about her elderly mother and ways she can be more supportive of her and also help in  getting her evaluated re: some memory decline. Anxiety still comes and goes, but not quite as debilitating, and usually is in reference to someone within family, which we discussed more thoroughly today.  Interventions: Cognitive Behavioral Therapy and Ego-Supportive  Long-term goal: Develop healthy cognitive patterns and beliefs about self and the world that lead to alleviation and help prevent the relapse of depressive symptoms Short-term goal: Replace negative self-defeating self-talk with verbalization of realistic and positive cognitive messages. Strategies: Reinforce positive, reality-based cognitive messages that enhance self-confidence and increase adaptive action.   Diagnosis:   ICD-10-CM   1. Generalized anxiety disorder  F41.1      Plan:   Patient in session today working well on her anxiety and depression, with her depression having decreased some. Relationship with teen son showing some increased volatility and patient struggles with  appropriate limit setting. Good motivation in session today on patient's part. Encouraged patient to be practicing more positive and self affirming behaviors as noted in sessions including: Remain in the present focused on what she can change or control, stay in touch with people who are supportive of her, refrain from assuming that things are always going to go in a negative direction, get outside some each day as weather allows, healthy nutrition and exercise as she is able, positive and encouraging self talk, challenge and counteract her self-doubt, reduce her overthinking and over analyzing, and realize the strength she shows when working with goal-directed behaviors moving in a direction that supports  her overall improved health and her outlook into the future patient has made some progress and needs to continue her work with goal-directed behaviors in order to move forward in a positive and healthier direction.  Goal review and progress/challenges  noted with patient.  Next appointment within 3 to 4 weeks.   Laurie Fare, LCSW

## 2023-09-24 DIAGNOSIS — M47816 Spondylosis without myelopathy or radiculopathy, lumbar region: Secondary | ICD-10-CM | POA: Diagnosis not present

## 2023-09-24 DIAGNOSIS — M2560 Stiffness of unspecified joint, not elsewhere classified: Secondary | ICD-10-CM | POA: Diagnosis not present

## 2023-09-24 DIAGNOSIS — R531 Weakness: Secondary | ICD-10-CM | POA: Diagnosis not present

## 2023-09-24 DIAGNOSIS — R262 Difficulty in walking, not elsewhere classified: Secondary | ICD-10-CM | POA: Diagnosis not present

## 2023-09-24 DIAGNOSIS — M25552 Pain in left hip: Secondary | ICD-10-CM | POA: Diagnosis not present

## 2023-09-26 ENCOUNTER — Other Ambulatory Visit: Payer: Self-pay | Admitting: Oncology

## 2023-09-26 ENCOUNTER — Inpatient Hospital Stay: Payer: BC Managed Care – PPO

## 2023-09-26 ENCOUNTER — Inpatient Hospital Stay: Payer: BC Managed Care – PPO | Attending: Oncology | Admitting: Oncology

## 2023-09-26 ENCOUNTER — Encounter: Payer: Self-pay | Admitting: Oncology

## 2023-09-26 VITALS — BP 144/93 | HR 95 | Temp 97.9°F | Resp 16 | Ht 63.75 in | Wt 238.6 lb

## 2023-09-26 DIAGNOSIS — C50912 Malignant neoplasm of unspecified site of left female breast: Secondary | ICD-10-CM

## 2023-09-26 DIAGNOSIS — Z79811 Long term (current) use of aromatase inhibitors: Secondary | ICD-10-CM | POA: Diagnosis not present

## 2023-09-26 DIAGNOSIS — C773 Secondary and unspecified malignant neoplasm of axilla and upper limb lymph nodes: Secondary | ICD-10-CM | POA: Insufficient documentation

## 2023-09-26 DIAGNOSIS — M2560 Stiffness of unspecified joint, not elsewhere classified: Secondary | ICD-10-CM | POA: Diagnosis not present

## 2023-09-26 DIAGNOSIS — M85852 Other specified disorders of bone density and structure, left thigh: Secondary | ICD-10-CM | POA: Diagnosis not present

## 2023-09-26 DIAGNOSIS — R262 Difficulty in walking, not elsewhere classified: Secondary | ICD-10-CM | POA: Diagnosis not present

## 2023-09-26 DIAGNOSIS — C50919 Malignant neoplasm of unspecified site of unspecified female breast: Secondary | ICD-10-CM | POA: Insufficient documentation

## 2023-09-26 DIAGNOSIS — M25552 Pain in left hip: Secondary | ICD-10-CM | POA: Diagnosis not present

## 2023-09-26 DIAGNOSIS — Z170 Estrogen receptor positive status [ER+]: Secondary | ICD-10-CM | POA: Insufficient documentation

## 2023-09-26 DIAGNOSIS — M47816 Spondylosis without myelopathy or radiculopathy, lumbar region: Secondary | ICD-10-CM | POA: Diagnosis not present

## 2023-09-26 DIAGNOSIS — R531 Weakness: Secondary | ICD-10-CM | POA: Diagnosis not present

## 2023-09-26 LAB — CBC WITH DIFFERENTIAL (CANCER CENTER ONLY)
Abs Immature Granulocytes: 0.03 10*3/uL (ref 0.00–0.07)
Basophils Absolute: 0.1 10*3/uL (ref 0.0–0.1)
Basophils Relative: 1 %
Eosinophils Absolute: 0.4 10*3/uL (ref 0.0–0.5)
Eosinophils Relative: 4 %
HCT: 43.3 % (ref 36.0–46.0)
Hemoglobin: 14.7 g/dL (ref 12.0–15.0)
Immature Granulocytes: 0 %
Lymphocytes Relative: 22 %
Lymphs Abs: 2.3 10*3/uL (ref 0.7–4.0)
MCH: 28.2 pg (ref 26.0–34.0)
MCHC: 33.9 g/dL (ref 30.0–36.0)
MCV: 83.1 fL (ref 80.0–100.0)
Monocytes Absolute: 0.6 10*3/uL (ref 0.1–1.0)
Monocytes Relative: 6 %
Neutro Abs: 7.1 10*3/uL (ref 1.7–7.7)
Neutrophils Relative %: 67 %
Platelet Count: 336 10*3/uL (ref 150–400)
RBC: 5.21 MIL/uL — ABNORMAL HIGH (ref 3.87–5.11)
RDW: 13.7 % (ref 11.5–15.5)
WBC Count: 10.6 10*3/uL — ABNORMAL HIGH (ref 4.0–10.5)
nRBC: 0 % (ref 0.0–0.2)
nRBC: 0 /100{WBCs}

## 2023-09-26 LAB — CMP (CANCER CENTER ONLY)
ALT: 15 U/L (ref 0–44)
AST: 18 U/L (ref 15–41)
Albumin: 4.4 g/dL (ref 3.5–5.0)
Alkaline Phosphatase: 112 U/L (ref 38–126)
Anion gap: 13 (ref 5–15)
BUN: 24 mg/dL — ABNORMAL HIGH (ref 6–20)
CO2: 24 mmol/L (ref 22–32)
Calcium: 9.8 mg/dL (ref 8.9–10.3)
Chloride: 102 mmol/L (ref 98–111)
Creatinine: 1.49 mg/dL — ABNORMAL HIGH (ref 0.44–1.00)
GFR, Estimated: 41 mL/min — ABNORMAL LOW (ref >60)
Glucose, Bld: 118 mg/dL — ABNORMAL HIGH (ref 70–99)
Potassium: 3.8 mmol/L (ref 3.5–5.1)
Sodium: 138 mmol/L (ref 135–145)
Total Bilirubin: 0.3 mg/dL (ref 0.0–1.2)
Total Protein: 7.7 g/dL (ref 6.5–8.1)

## 2023-09-26 NOTE — Progress Notes (Signed)
 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  She received neoadjuvant chemotherapy with docetaxel/doxorubicin/cyclophosphamide completed in July, 2021.  She underwent left mastectomy and axillary dissection, with no breast primary found and 9/26 lymph nodes positive for metastasis. She completed adjuvant radiation at the end of November, and was placed on hormonal therapy with tamoxifen at the end of December. Baseline bone density scan was completely normal. She was unable to tolerate tamoxifen and was switched to anastrazole. She is tolerating this better with minimal side effects, mainly muscle and joint pain and fatigue.  Osteopenia She had a bone density scan done on 10/03/2022 that revealed a normal spine and osteopenia of the left femur with a stable T-score of -1.8. She will be due for repeat DEXA next year.  Plan: She continues Anastrozole without difficulty.She has a slightly elevated WBC of 10.6, hemoglobin of 14.7, and platelet count of 336,000. Her CMP is normal other than a elevated creatinine of 1.49 with a BUN of 24. She is up to date on her bone density scan and Dr. Georgiana Shore continues to schedule her mammograms and follow up in August. I will see her back in 1 year with CBC, CMP, and bone density scan. The patient understands the plans discussed today and is in agreement with them.  She knows to contact our office if she develops concerns prior to her  next appointment.   I provided 16 minutes of face-to-face time during this this encounter and > 50% was spent counseling as documented under my assessment and plan.   Gery Pray MD  [facility] [address] [place] [address] Dept: [phone] Dept Fax: [phone]   No orders of the defined types were placed in this encounter.   CHIEF COMPLAINT:  CC: History of breast cancer  Current Treatment:  Anastrazole  INTERVAL HISTORY:  Laurie Simmons is here today for repeat clinical assessment for her history of breast cancer, she is 4 years post-op. Patient states that she feels well but complains of constant fatigue, random shooting pains of both breasts, and a hard knot in her left arm. She continues Anastrozole without difficulty.She has a slightly elevated WBC of 10.6, hemoglobin of 14.7, and platelet count of 336,000. Her CMP is normal other than a elevated creatinine of 1.49 with a BUN of 24. She is up to date on her bone density scan and Dr. Georgiana Shore continues to schedule her mammograms and follow up in August. I will see her back in 1 year with CBC, CMP, and bone density scan.   She denies signs of infection such as sore throat, sinus drainage, cough, or urinary symptoms.  She denies fevers or recurrent chills. She denies nausea, vomiting, chest pain, dyspnea or cough. Her appetite is good and her weight has increased 5 pounds over last 7 months . She is accompanied at today's visit with her husband.    I have reviewed the past medical history, past surgical history, social history and family history  with the patient and they are unchanged from previous note.  ALLERGIES:  is allergic to effexor [venlafaxine] and nsaids.  MEDICATIONS:  Current Outpatient Medications  Medication Sig Dispense Refill   AIRSUPRA 90-80 MCG/ACT AERO Inhale 2 puffs into the lungs as needed (every 4 to 6 hours for cough, wheeze,  shortness of breath.  Rinse, mouth after use). 10.7 g 1   anastrozole (ARIMIDEX) 1 MG tablet TAKE 1 TABLET(1 MG) BY MOUTH DAILY (Patient taking differently: Take 1 mg by mouth daily.) 90 tablet 3   aspirin EC 81 MG tablet Take 1 tablet (81 mg total) by mouth daily. Swallow whole. 90 tablet 3   Biotin [address] MCG TBDP Take 10,000 mcg by mouth daily.     calcium carbonate (TUMS EX) 750 MG chewable tablet Chew 1,500 mg by mouth daily as needed for heartburn.     cariprazine (VRAYLAR) 1.5 MG capsule Take 1 capsule (1.5 mg total) by mouth daily. 30 capsule 2   clorazepate (TRANXENE) 3.75 MG tablet Take one tablet daily as needed for anxiety/panic attacks. (Patient taking differently: Take 3.75 mg by mouth at bedtime as needed for anxiety. Take one tablet daily as needed for anxiety/panic attacks.) 30 tablet 2   EPINEPHrine (EPIPEN 2-PAK) 0.3 mg/0.3 mL IJ SOAJ injection Use as directed for life-threatening allergic reaction. (Patient taking differently: Inject 0.3 mg into the muscle as needed for anaphylaxis. Use as directed for life-threatening allergic reaction.) 1 each 3   famotidine (PEPCID) 40 MG tablet TAKE 1 TABLET BY MOUTH TWICE DAILY AS DIRECTED 60 tablet 5   FARXIGA 10 MG TABS tablet Take 10 mg by mouth daily.     fluticasone (FLONASE) 50 MCG/ACT nasal spray Place 2 sprays into both nostrils daily as needed for allergies or rhinitis.     folic acid (FOLVITE) 1 MG tablet TAKE 1 TABLET BY MOUTH DAILY (Patient taking differently: Take 1 mg by mouth daily.) 30 tablet 5   gabapentin (NEURONTIN) 300 MG capsule Take 1 capsule (300 mg total) by mouth 2 (two) times daily. (Patient taking differently: Take 300 mg by mouth at bedtime.) 60 capsule 3   loratadine (CLARITIN) 10 MG tablet TAKE 1 TABLET BY MOUTH ONCE DAILY AS NEEDED (Patient taking differently: Take 10 mg by mouth daily as needed for allergies or rhinitis. TAKE 1 TABLET BY MOUTH ONCE DAILY AS NEEDED) 90 tablet 1   MOUNJARO 7.5 MG/0.5ML Pen Inject  7.5 mg into the skin once a week.     ondansetron (ZOFRAN) 4 MG tablet TAKE 1 TABLET BY MOUTH EVERY 4 HOURS AS NEEDED FOR NAUSEA (Patient taking differently: Take 4 mg by mouth every 4 (four) hours as needed for nausea or vomiting.) 30 tablet 0   potassium chloride (KLOR-CON M) 10 MEQ tablet Take 2 tablets (20 mEq total) by mouth 2 (two) times daily. 120 tablet 5   prochlorperazine (COMPAZINE) 10 MG tablet TAKE 1 TABLET BY MOUTH EVERY 6 HOURS AS NEEDED FOR NAUSEA (Patient taking differently: Take 10 mg by mouth every 6 (six) hours as needed for nausea or vomiting.) 90 tablet 1   rosuvastatin (CRESTOR) 20 MG tablet TAKE 1 TABLET(20 MG) BY MOUTH DAILY (Patient taking differently: Take 20 mg by mouth daily.) 90 tablet 2   senna-docusate (SENOKOT-S) 8.6-50 MG tablet Take 2 tablets by mouth daily.     topiramate (TOPAMAX) 50 MG tablet Take 1 tablet (50 mg total) by mouth daily. 90 tablet 3   UNABLE TO FIND Inject 1 each as directed  See admin instructions. Allergy injections for cats, grass, and dust.     valsartan-hydrochlorothiazide (DIOVAN-HCT) 320-25 MG tablet Take 1 tablet by mouth daily.  0   Vilazodone HCl (VIIBRYD) 40 MG TABS Take 1 tablet (40 mg total) by mouth daily. TAKE 1 TABLET(40 MG) BY MOUTH DAILY 90 tablet 3   WIXELA INHUB 250-50 MCG/ACT AEPB INHALE 1 PUFF BY MOUTH TWICE DAILY FOR COUGH OR WHEEZE. RINSE MOUTH AFTER USE (Patient taking differently: Inhale 1 puff into the lungs in the morning and at bedtime.) 60 each 1   No current facility-administered medications for this visit.   HISTORY OF PRESENT ILLNESS:   Oncology History  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)  08/16/2019 Cancer Staging   Staging form: Breast, AJCC 8th Edition - Clinical stage from 08/16/2019: Stage IIIA (cT0, cN2(f), cM0, G3, ER+, PR+, HER2-) - Signed by Dellia Beckwith, MD on 06/28/2020 Staging comments: TAC x 6 as neoadjuvant chemo   08/23/2019 Initial Diagnosis   Secondary malignant  neoplasm of axillary lymph nodes (HCC)   03/15/2020 Cancer Staging   Staging form: Breast, AJCC 8th Edition - Pathologic stage from 03/15/2020: No Stage Recommended (ypT0, pN2a, cM0, G3, ER+, PR+, HER2-) - Signed by Dellia Beckwith, MD on 06/28/2020 Staging comments: Mastectomy with no primary found, postop radiation, then hormonal Rx     REVIEW OF SYSTEMS:  Review of Systems  Constitutional:  Positive for fatigue. Negative for appetite change, chills, diaphoresis, fever and unexpected weight change.  HENT:  Negative.  Negative for hearing loss, lump/mass, mouth sores, nosebleeds, sore throat, tinnitus, trouble swallowing and voice change.        Alopecia   Eyes: Negative.  Negative for eye problems and icterus.  Respiratory: Negative.  Negative for chest tightness, cough, hemoptysis, shortness of breath and wheezing.   Cardiovascular: Negative.  Negative for chest pain (Random shooting pain in both breasts), leg swelling and palpitations.  Gastrointestinal:  Negative for abdominal distention, abdominal pain, blood in stool, constipation, diarrhea, nausea, rectal pain and vomiting.  Endocrine: Negative.   Genitourinary:  Negative for bladder incontinence, difficulty urinating, dyspareunia, dysuria, frequency, hematuria, menstrual problem, nocturia, pelvic pain, vaginal bleeding and vaginal discharge.   Musculoskeletal:  Positive for back pain (4/10). Negative for arthralgias, flank pain, gait problem, myalgias, neck pain and neck stiffness.  Skin: Negative.  Negative for itching, rash and wound.       Cyst in the left upper arm  Neurological:  Negative for dizziness, extremity weakness, gait problem, headaches, light-headedness, numbness, seizures and speech difficulty.  Hematological: Negative.  Negative for adenopathy. Does not bruise/bleed easily.  Psychiatric/Behavioral: Negative.  Negative for confusion, decreased concentration, depression, sleep disturbance and suicidal ideas. The  patient is not nervous/anxious.    VITALS:  Blood pressure (!) 144/93, pulse 95, temperature 97.9 F (36.6 C), temperature source Oral, resp. rate 16, height 5' 3.75" (1.619 m), weight 238 lb 9.6 oz (108.2 kg), last menstrual period 01/23/2011, SpO2 98%.  Wt Readings from Last 3 Encounters:  09/29/23 239 lb 3.2 oz (108.5 kg)  09/26/23 238 lb 9.6 oz (108.2 kg)  09/15/23 238 lb (108 kg)    Body mass index is 41.28 kg/m.  Performance status (ECOG): 1 - Symptomatic but completely ambulatory  PHYSICAL EXAM:  Physical Exam Vitals and nursing note reviewed. Exam conducted with a chaperone present.  Constitutional:      General: She is not in acute distress.    Appearance: Normal appearance. She is normal weight.  She is not ill-appearing, toxic-appearing or diaphoretic.  HENT:     Head: Normocephalic and atraumatic.     Right Ear: Tympanic membrane, ear canal and external ear normal. There is no impacted cerumen.     Left Ear: Tympanic membrane, ear canal and external ear normal. There is no impacted cerumen.     Nose: Nose normal. No congestion or rhinorrhea.     Mouth/Throat:     Mouth: Mucous membranes are moist.     Pharynx: Oropharynx is clear. No oropharyngeal exudate or posterior oropharyngeal erythema.  Eyes:     General: No scleral icterus.       Right eye: No discharge.        Left eye: No discharge.     Extraocular Movements: Extraocular movements intact.     Conjunctiva/sclera: Conjunctivae normal.     Pupils: Pupils are equal, round, and reactive to light.  Neck:     Vascular: No carotid bruit.  Cardiovascular:     Rate and Rhythm: Normal rate and regular rhythm.     Pulses: Normal pulses.     Heart sounds: Normal heart sounds. No murmur heard.    No friction rub. No gallop.  Pulmonary:     Effort: Pulmonary effort is normal. No respiratory distress.     Breath sounds: Normal breath sounds. No stridor. No wheezing, rhonchi or rales.  Chest:     Chest wall: No  tenderness.     Comments: Left mastectomy is negative but does have nodularity around the surrounding scar tissue. Right breast is without masses. Abdominal:     General: Bowel sounds are normal. There is no distension.     Palpations: Abdomen is soft. There is no hepatomegaly, splenomegaly or mass.     Tenderness: There is no abdominal tenderness. There is no right CVA tenderness, left CVA tenderness, guarding or rebound.     Hernia: No hernia is present.  Musculoskeletal:        General: No swelling, tenderness, deformity or signs of injury. Normal range of motion.     Cervical back: Normal range of motion and neck supple. No rigidity or tenderness.     Right lower leg: No edema.     Left lower leg: No edema.  Lymphadenopathy:     Cervical: No cervical adenopathy.     Right cervical: No superficial, deep or posterior cervical adenopathy.    Left cervical: No superficial, deep or posterior cervical adenopathy.     Upper Body:     Right upper body: No supraclavicular, axillary or pectoral adenopathy.     Left upper body: No supraclavicular, axillary or pectoral adenopathy.  Skin:    General: Skin is warm and dry.     Coloration: Skin is not jaundiced or pale.     Findings: No bruising, erythema, lesion or rash.     Comments: 1cm cyst in the left upper arm which feels benign  Neurological:     General: No focal deficit present.     Mental Status: She is alert and oriented to person, place, and time. Mental status is at baseline.     Cranial Nerves: No cranial nerve deficit.     Sensory: No sensory deficit.     Motor: No weakness.     Coordination: Coordination normal.     Gait: Gait normal.     Deep Tendon Reflexes: Reflexes normal.  Psychiatric:        Mood and Affect: Mood normal.  Behavior: Behavior normal.        Thought Content: Thought content normal.        Judgment: Judgment normal.    LABORATORY DATA:  I have reviewed the data as listed    Latest Ref Rng &  Units 09/26/2023    3:27 PM 03/28/2023    3:54 PM 05/27/2022    2:07 PM  CBC  WBC 4.0 - 10.5 K/uL 10.6  9.3  10.1   Hemoglobin 12.0 - 15.0 g/dL 86.5  78.4  69.6   Hematocrit 36.0 - 46.0 % 43.3  45.2  41.4   Platelets 150 - 400 K/uL 336  349  347    Lab Results  Component Value Date   CREATININE 1.49 (H) 09/26/2023   BUN 24 (H) 09/26/2023   NA 138 09/26/2023   K 3.8 09/26/2023   CL 102 09/26/2023   CO2 24 09/26/2023      Component Value Date/Time   PROT 7.7 09/26/2023 1527   ALBUMIN 4.4 09/26/2023 1527   AST 18 09/26/2023 1527   ALT 15 09/26/2023 1527   ALKPHOS 112 09/26/2023 1527   BILITOT 0.3 09/26/2023 1527     RADIOGRAPHIC STUDIES: I have personally reviewed the radiological images as listed and agreed with the findings in the report.    I,Jasmine M Lassiter,acting as a scribe for Dellia Beckwith, MD.,have documented all relevant documentation on the behalf of Dellia Beckwith, MD,as directed by  Dellia Beckwith, MD while in the presence of Dellia Beckwith, MD.

## 2023-09-29 ENCOUNTER — Encounter: Payer: Self-pay | Admitting: Cardiology

## 2023-09-29 ENCOUNTER — Ambulatory Visit: Attending: Cardiology | Admitting: Cardiology

## 2023-09-29 VITALS — BP 124/74 | HR 91 | Ht 65.0 in | Wt 239.2 lb

## 2023-09-29 DIAGNOSIS — E119 Type 2 diabetes mellitus without complications: Secondary | ICD-10-CM

## 2023-09-29 DIAGNOSIS — M47816 Spondylosis without myelopathy or radiculopathy, lumbar region: Secondary | ICD-10-CM | POA: Diagnosis not present

## 2023-09-29 DIAGNOSIS — I10 Essential (primary) hypertension: Secondary | ICD-10-CM

## 2023-09-29 DIAGNOSIS — R531 Weakness: Secondary | ICD-10-CM | POA: Diagnosis not present

## 2023-09-29 DIAGNOSIS — I2510 Atherosclerotic heart disease of native coronary artery without angina pectoris: Secondary | ICD-10-CM

## 2023-09-29 DIAGNOSIS — M25552 Pain in left hip: Secondary | ICD-10-CM | POA: Diagnosis not present

## 2023-09-29 DIAGNOSIS — R262 Difficulty in walking, not elsewhere classified: Secondary | ICD-10-CM | POA: Diagnosis not present

## 2023-09-29 DIAGNOSIS — M2560 Stiffness of unspecified joint, not elsewhere classified: Secondary | ICD-10-CM | POA: Diagnosis not present

## 2023-09-29 NOTE — Patient Instructions (Signed)

## 2023-09-29 NOTE — Progress Notes (Signed)
 Cardiology Office Note:    Date:  09/29/2023   ID:  Laurie Simmons, DOB 15-Oct-1965, MRN [id number]  PCP:  Abner Greenspan, MD  Cardiologist:  Gypsy Balsam, MD    Referring MD: Abner Greenspan, MD   Chief Complaint  Patient presents with   Follow-up    History of Present Illness:    Laurie Simmons is a 58 y.o. female past medical history significant for coronary artery disease initially she was sent to [place] because of atypical chest pain, stress test being done show ischemia in LAD territory after the cardiac catheterization showed only 25% stenosis of the right coronary artery since that time she is managed medically.  Additional problem include essential hypertension, diabetes, dyslipidemia, morbid obesity. Today to months for follow-up overall doing great.  She is asymptomatic, denies have any chest pain tightness squeezing pressure burning chest.  She does have chronic arthritis in her back which prevent her from exercising more but otherwise doing well  Past Medical History:  Diagnosis Date   Abnormal stress test: Anterior wall ischemia cardiac cath after that showing 25% RCA no LAD disease 02/12/2022   Acquired absence of left breast 08/18/2020   Allergic rhinitis 03/10/2015   Anxiety    Anxiety and depression 08/30/2021   Arthritis    Asthma    Bilateral carpal tunnel syndrome 08/25/2018   BMI 40.0-44.9, adult (HCC) 08/23/2019   Breast cancer (HCC) 2021   Carpal tunnel syndrome    Carpal tunnel syndrome    bilateral   Chronic kidney disease    stage 3   Depression    Diabetes (HCC)    Dyslipidemia 03/27/2022   Essential hypertension 02/12/2022   Fatigue 08/30/2021   Fibroid 05/04/2019   Fibroids, intramural 08/09/2019   GERD (gastroesophageal reflux disease)    Hemodynamic instability 03/10/2015   Hypertension    Idiopathic urticaria 03/10/2015   Iron deficiency anemia due to chronic blood loss 08/09/2019   Irritable bowel syndrome    Malignant neoplasm of female breast  (HCC) 04/17/2022   Moderate persistent asthma 03/10/2015   Obesity    Pain in both hands 07/16/2018   PMB (postmenopausal bleeding) 04/17/2022   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   Severe anemia 12/14/2015   Type 2 diabetes mellitus without complication, without long-term current use of insulin (HCC) 02/12/2022    Past Surgical History:  Procedure Laterality Date   caesarean     CESAREAN SECTION     CHOLECYSTECTOMY     DILATION AND CURETTAGE OF UTERUS     HIP SURGERY Left    LEFT HEART CATH AND CORONARY ANGIOGRAPHY N/A 02/13/2022   Procedure: LEFT HEART CATH AND CORONARY ANGIOGRAPHY;  Surgeon: Corky Crafts, MD;  Location: [facility];  Service: Cardiovascular;  Laterality: N/A;   MASTECTOMY     PORT A CATH INJECTION ([facility] HX)  2021    Current Medications: Current Meds  Medication Sig   AIRSUPRA 90-80 MCG/ACT AERO Inhale 2 puffs into the lungs as needed (every 4 to 6 hours for cough, wheeze, shortness of breath.  Rinse, mouth after use).   anastrozole (ARIMIDEX) 1 MG tablet TAKE 1 TABLET(1 MG) BY MOUTH DAILY (Patient taking differently: Take 1 mg by mouth daily.)   aspirin EC 81 MG tablet Take 1 tablet (81 mg total) by mouth daily. Swallow whole.   Biotin 40981 MCG TBDP Take 10,000 mcg by mouth daily.   calcium carbonate (TUMS EX) 750 MG chewable tablet  Chew 1,500 mg by mouth daily as needed for heartburn.   cariprazine (VRAYLAR) 1.5 MG capsule Take 1 capsule (1.5 mg total) by mouth daily.   clorazepate (TRANXENE) 3.75 MG tablet Take one tablet daily as needed for anxiety/panic attacks. (Patient taking differently: Take 3.75 mg by mouth at bedtime as needed for anxiety. Take one tablet daily as needed for anxiety/panic attacks.)   EPINEPHrine (EPIPEN 2-PAK) 0.3 mg/0.3 mL IJ SOAJ injection Use as directed for life-threatening allergic reaction. (Patient taking differently: Inject 0.3 mg into the muscle as needed for anaphylaxis. Use as  directed for life-threatening allergic reaction.)   famotidine (PEPCID) 40 MG tablet TAKE 1 TABLET BY MOUTH TWICE DAILY AS DIRECTED   FARXIGA 10 MG TABS tablet Take 10 mg by mouth daily.   fluticasone (FLONASE) 50 MCG/ACT nasal spray Place 2 sprays into both nostrils daily as needed for allergies or rhinitis.   folic acid (FOLVITE) 1 MG tablet TAKE 1 TABLET BY MOUTH DAILY (Patient taking differently: Take 1 mg by mouth daily.)   gabapentin (NEURONTIN) 300 MG capsule Take 1 capsule (300 mg total) by mouth 2 (two) times daily. (Patient taking differently: Take 300 mg by mouth at bedtime.)   loratadine (CLARITIN) 10 MG tablet TAKE 1 TABLET BY MOUTH ONCE DAILY AS NEEDED (Patient taking differently: Take 10 mg by mouth daily as needed for allergies or rhinitis. TAKE 1 TABLET BY MOUTH ONCE DAILY AS NEEDED)   MOUNJARO 7.5 MG/0.5ML Pen Inject 7.5 mg into the skin once a week.   ondansetron (ZOFRAN) 4 MG tablet TAKE 1 TABLET BY MOUTH EVERY 4 HOURS AS NEEDED FOR NAUSEA (Patient taking differently: Take 4 mg by mouth every 4 (four) hours as needed for nausea or vomiting.)   potassium chloride (KLOR-CON M) 10 MEQ tablet Take 2 tablets (20 mEq total) by mouth 2 (two) times daily.   prochlorperazine (COMPAZINE) 10 MG tablet TAKE 1 TABLET BY MOUTH EVERY 6 HOURS AS NEEDED FOR NAUSEA (Patient taking differently: Take 10 mg by mouth every 6 (six) hours as needed for nausea or vomiting.)   rosuvastatin (CRESTOR) 20 MG tablet TAKE 1 TABLET(20 MG) BY MOUTH DAILY (Patient taking differently: Take 20 mg by mouth daily.)   senna-docusate (SENOKOT-S) 8.6-50 MG tablet Take 2 tablets by mouth daily.   topiramate (TOPAMAX) 50 MG tablet Take 1 tablet (50 mg total) by mouth daily.   UNABLE TO FIND Inject 1 each as directed See admin instructions. Allergy injections for cats, grass, and dust.   valsartan-hydrochlorothiazide (DIOVAN-HCT) 320-25 MG tablet Take 1 tablet by mouth daily.   Vilazodone HCl (VIIBRYD) 40 MG TABS Take 1  tablet (40 mg total) by mouth daily. TAKE 1 TABLET(40 MG) BY MOUTH DAILY   WIXELA INHUB 250-50 MCG/ACT AEPB INHALE 1 PUFF BY MOUTH TWICE DAILY FOR COUGH OR WHEEZE. RINSE MOUTH AFTER USE (Patient taking differently: Inhale 1 puff into the lungs in the morning and at bedtime.)     Allergies:   Effexor [venlafaxine] and Nsaids   Social History   Socioeconomic History   Marital status: Married    Spouse name: Phil   Number of children: 2   Years of education: Not on file   Highest education level: Not on file  Occupational History   Not on file  Tobacco Use   Smoking status: Never   Smokeless tobacco: Never  Vaping Use   Vaping status: Never Used  Substance and Sexual Activity   Alcohol use: No   Drug use: No  Sexual activity: Yes  Other Topics Concern   Not on file  Social History Narrative   Not on file   Social Drivers of Health   Financial Resource Strain: Not on file  Food Insecurity: Not on file  Transportation Needs: Not on file  Physical Activity: Not on file  Stress: Not on file  Social Connections: Not on file     Family History: The patient's family history includes Cancer in her maternal grandmother; Kidney disease in her mother; Lung cancer in her paternal grandfather; Prostate cancer in her maternal grandfather; Pulmonary fibrosis in her father. ROS:   Please see the history of present illness.    All 14 point review of systems negative except as described per history of present illness  EKGs/Labs/Other Studies Reviewed:    EKG Interpretation Date/Time:  Monday September 29 2023 13:39:55 EDT Ventricular Rate:  91 PR Interval:  158 QRS Duration:  90 QT Interval:  350 QTC Calculation: 430 R Axis:   21  Text Interpretation: Normal sinus rhythm Possible Inferior infarct , age undetermined Cannot rule out Anterior infarct , age undetermined Abnormal ECG No previous ECGs available Confirmed by Gypsy Balsam [id number]) on 09/29/2023 1:42:49 PM    Recent  Labs: 09/26/2023: ALT 15; BUN 24; Creatinine 1.49; Hemoglobin 14.7; Platelet Count 336; Potassium 3.8; Sodium 138  Recent Lipid Panel    Component Value Date/Time   CHOL 130 06/26/2022 0835   TRIG 224 (H) 06/26/2022 0835   HDL 37 (L) 06/26/2022 0835   CHOLHDL 3.5 06/26/2022 0835   LDLCALC 57 06/26/2022 0835    Physical Exam:    VS:  BP 124/74 (BP Location: Left Arm, Patient Position: Sitting)   Pulse 91   Ht 5\' 5"  (1.651 m)   Wt 239 lb 3.2 oz (108.5 kg)   LMP 01/23/2011   SpO2 99%   BMI 39.80 kg/m     Wt Readings from Last 3 Encounters:  09/29/23 239 lb 3.2 oz (108.5 kg)  09/26/23 238 lb 9.6 oz (108.2 kg)  09/15/23 238 lb (108 kg)     GEN:  Well nourished, well developed in no acute distress HEENT: Normal NECK: No JVD; No carotid bruits LYMPHATICS: No lymphadenopathy CARDIAC: RRR, no murmurs, no rubs, no gallops RESPIRATORY:  Clear to auscultation without rales, wheezing or rhonchi  ABDOMEN: Soft, non-tender, non-distended MUSCULOSKELETAL:  No edema; No deformity  SKIN: Warm and dry LOWER EXTREMITIES: no swelling NEUROLOGIC:  Alert and oriented x 3 PSYCHIATRIC:  Normal affect   ASSESSMENT:    1. Essential hypertension   2. Coronary artery disease involving native coronary artery of native heart without angina pectoris   3. Type 2 diabetes mellitus without complication, without long-term current use of insulin (HCC)    PLAN:    In order of problems listed above:  Coronary disease only 25% stenosis of the right coronary artery, asymptomatic risk factors modification she is on aspirin which is antiplatelet therapy which I will continue.  She is also on a statin. Dyslipidemia I did review her K PN which show me LDL 48 HDL 43.  She is taking Crestor 20 which I will continue. Type 2 diabetes last hemoglobin A1c from January 14 is 6.3.  Will continue present management good management. I did talk to her about need to exercise on a regular basis and good  diet   Medication Adjustments/Labs and Tests Ordered: Current medicines are reviewed at length with the patient today.  Concerns regarding medicines are outlined above.  Orders Placed This Encounter  Procedures   EKG 12-Lead   Medication changes: No orders of the defined types were placed in this encounter.   Signed, Georgeanna Lea, MD, [facility] 09/29/2023 1:55 PM    [facility]

## 2023-10-05 ENCOUNTER — Other Ambulatory Visit: Payer: Self-pay | Admitting: Allergy and Immunology

## 2023-10-06 ENCOUNTER — Other Ambulatory Visit: Payer: Self-pay | Admitting: Oncology

## 2023-10-06 DIAGNOSIS — M8580 Other specified disorders of bone density and structure, unspecified site: Secondary | ICD-10-CM | POA: Insufficient documentation

## 2023-10-06 DIAGNOSIS — C773 Secondary and unspecified malignant neoplasm of axilla and upper limb lymph nodes: Secondary | ICD-10-CM

## 2023-10-09 DIAGNOSIS — I129 Hypertensive chronic kidney disease with stage 1 through stage 4 chronic kidney disease, or unspecified chronic kidney disease: Secondary | ICD-10-CM | POA: Diagnosis not present

## 2023-10-09 DIAGNOSIS — N1831 Chronic kidney disease, stage 3a: Secondary | ICD-10-CM | POA: Diagnosis not present

## 2023-10-09 DIAGNOSIS — D631 Anemia in chronic kidney disease: Secondary | ICD-10-CM | POA: Diagnosis not present

## 2023-10-09 DIAGNOSIS — N2581 Secondary hyperparathyroidism of renal origin: Secondary | ICD-10-CM | POA: Diagnosis not present

## 2023-10-13 ENCOUNTER — Ambulatory Visit (INDEPENDENT_AMBULATORY_CARE_PROVIDER_SITE_OTHER): Payer: Self-pay | Admitting: *Deleted

## 2023-10-13 DIAGNOSIS — J309 Allergic rhinitis, unspecified: Secondary | ICD-10-CM | POA: Diagnosis not present

## 2023-10-23 ENCOUNTER — Ambulatory Visit: Admitting: Psychiatry

## 2023-10-23 DIAGNOSIS — F411 Generalized anxiety disorder: Secondary | ICD-10-CM

## 2023-10-23 NOTE — Progress Notes (Signed)
 [facility] Counselor/Therapist Progress Note  Patient ID: Laurie Simmons, MRN: [id number],    Date: 10/23/2023  Time Spent: 50 minutes   Treatment Type: Individual Therapy  Reported Symptoms: anxiety, decreased depression, tendency to see the negatives more     Mental Status Exam:  Appearance:   Casual     Behavior:  Appropriate, Sharing, and Motivated  Motor:  Normal  Speech/Language:   Clear and Coherent  Affect:  Depressed and anxious  Mood:  anxious and depressed  Thought process:  goal directed  Thought content:    Obsessions and mostly about mom's memory issues  Sensory/Perceptual disturbances:    WNL  Orientation:  oriented to person, place, time/date, situation, day of week, month of year, year, and stated date of April10, 2025  Attention:  Fair  Concentration:  Fair  Memory:  WNL  Fund of knowledge:   Good  Insight:    Good and Fair  Judgment:   Good  Impulse Control:  Good   Risk Assessment: Danger to Self:  No Self-injurious Behavior: No Danger to Others: No Duty to Warn:no Physical Aggression / Violence:No  Access to Firearms a concern: No  Gang Involvement:No   Subjective:   Patient today continuing her work on her anxiety, depression, frustration, some sadness and frustration regarding her mother and her declining memory, overthinking, family situations and stressors, and trying to see positives "and not just negatives".  Together we did acknowledge how her overthinking works against her and gets in the way of her progressing on some issues and also leads to her feeling more tired mentally and physically.  Shared last session about her mom's increasing memory issues and needed to talk further today about her own concerns with this.  Because of her memory issues, patient reports that her mother is not driving as much and patient supports this choice.  Talked through some of her stress, sadness, and other symptoms surrounding mom's decline and also patient's  own personal struggles mentally and emotionally."My nerves are shot this week, can't think about anything else, because my mom's condition and memory is worsening fast."  Needed time in session today to discuss her frustrations, sadness regarding mom's decline, and some fears of the future.    Interventions: Cognitive Behavioral Therapy, Solution-Oriented/Positive Psychology, and Ego-Supportive Long-term goal: Develop healthy cognitive patterns and beliefs about self and the world that lead to alleviation and help prevent the relapse of depressive symptoms Short-term goal: Replace negative self-defeating self-talk with verbalization of realistic and positive cognitive messages. Strategies: Reinforce positive, reality-based cognitive messages that enhance self-confidence and increase adaptive action.   Diagnosis:   ICD-10-CM   1. Generalized anxiety disorder  F41.1      Plan: Patient today working further in session on her anxiety, depression, and frustration mostly related to concerns/issues with her mother who is having memory issues. Discussed healthier limits and boundaries with mother and how patient can help mom and also take care of her own self. Good motivation and work in session with some strategies to use outside of sessions. Encouraging patient in her practice of positive and self-affirming behaviors as noted in session including: Remain in the present and focused on what she can change her control, stay in touch with people who are supportive of her, refrain from assuming that things are always going to go in a negative direction, get outside some each day as weather allows, healthy nutrition and exercise as she is able, positive and encouraging self  talk, challenge and counteract her self-doubt, reduce her overthinking and over analyzing, and recognize the strengths she shows when working with goal-directed behaviors to move in a direction that supports her overall improved health and  her outlook into the future.  Laurie Simmons has made progress and she needs to continue working with goal-directed behaviors in order to keep moving in a more hopeful and healthier direction.  Goal review and progress/challenges noted with patient.  Next appointment within 3 to 4 weeks.   Mathis Fare, LCSW

## 2023-10-31 ENCOUNTER — Other Ambulatory Visit: Payer: Self-pay | Admitting: Oncology

## 2023-11-02 ENCOUNTER — Other Ambulatory Visit: Payer: Self-pay | Admitting: Oncology

## 2023-11-02 DIAGNOSIS — E876 Hypokalemia: Secondary | ICD-10-CM

## 2023-11-10 ENCOUNTER — Ambulatory Visit (INDEPENDENT_AMBULATORY_CARE_PROVIDER_SITE_OTHER): Payer: Self-pay | Admitting: *Deleted

## 2023-11-10 DIAGNOSIS — J309 Allergic rhinitis, unspecified: Secondary | ICD-10-CM | POA: Diagnosis not present

## 2023-11-18 ENCOUNTER — Ambulatory Visit (INDEPENDENT_AMBULATORY_CARE_PROVIDER_SITE_OTHER): Admitting: Psychiatry

## 2023-11-18 DIAGNOSIS — F411 Generalized anxiety disorder: Secondary | ICD-10-CM | POA: Diagnosis not present

## 2023-11-18 NOTE — Progress Notes (Signed)
 [facility] Counselor/Therapist Progress Note  Patient ID: Laurie Simmons, MRN: [id number],    Date: 11/18/2023  Time Spent: 53 minutes   Treatment Type: Individual Therapy  Reported Symptoms: anxiety, decreased depression, tendency to see the negatives more "but I'm a little more calmer"   Mental Status Exam:  Appearance:   Casual     Behavior:  Appropriate, Sharing, and Motivated  Motor:  Can only walk for short distances due to back  pain  Speech/Language:   Clear and Coherent  Affect:  anxiety  Mood:  anxious  Thought process:  goal directed  Thought content:    Rumination  Sensory/Perceptual disturbances:    WNL  Orientation:  oriented to person, place, time/date, situation, day of week, month of year, year, and stated date of Nov 18, 2023  Attention:  Fair  Concentration:  Good  Memory:  WNL  Fund of knowledge:   Good  Insight:    Good  Judgment:   Good  Impulse Control:  Good   Risk Assessment: Danger to Self:  No Self-injurious Behavior: No Danger to Others: No Duty to Warn:no Physical Aggression / Violence:No  Access to Firearms a concern: No  Gang Involvement:No   Subjective:   Patient in for session today and focusing more on her anxiety, frustration, some depression, some sadness regarding her mother's declining memory, overthinking, family situations and stressors, but also working to see the positives in addition to the negatives. Confronting issues with high school daughter living with friend and needing to complete college, only lacking one course to graduate. Trying to help encourage family members and "not come across as negative." Concerns about her adult daughter. (Not all details included in this note due to patient privacy needs.) Working on her overthinking today and also to improve her listening skills to really hear daughter and be able to help her more. Feeling some progress on accepting and working with her mom to be helpful with mom's decline in  memory. Worrying is decreasing some. Trying to focus more on positives versus negatives. Know I need to be more positive with myself and not so negative. Discussed how she could begin better self-talk and self-care. To follow through in working on this as part of her therapy homework and will share results next session. Sadness about mom's decline and talking through this further today seemed helpful.  Interventions: Cognitive Behavioral Therapy, Solution-Oriented/Positive Psychology, and Ego-Supportive  Long-term goal: Develop healthy cognitive patterns and beliefs about self and the world that lead to alleviation and help prevent the relapse of depressive symptoms Short-term goal: Replace negative self-defeating self-talk with verbalization of realistic and positive cognitive messages. Strategies: Reinforce positive, reality-based cognitive messages that enhance self-confidence and increase adaptive action.   Diagnosis:   ICD-10-CM   1. Generalized anxiety disorder  F41.1      Plan: Patient motivated and working well in session today on her anxiety, frustrations, parenting issues, depression, concern for her aging mother with some memory issues. Some improvement noted in patient's depression as she talked through issues today in session.  Encouraged her in some progress and particularly as she stands by her mother who is having memory concerns.  Reviewed some of the healthier limits and boundaries she is using with her mom to help patient cope and take care of her own self also.  Really good work in session today and encouraged her use of more positive strategies as discussed in sessions to be able to better cope  and manage multiple family related situations outside of sessions. Reminded and encouraged patient to continue practicing positive and self affirming behaviors as noted in session including: Stay on the present and focused on what she can change or control, stay in touch with people who  are supportive of her, refrain from assuming that things are always going to go in a negative direction, get outside some each day as weather allows, healthy nutrition and exercise, positive and encouraging self talk, challenging counteract her self-doubt, reduce her overthinking and over analyzing, and recognize the strengths she shows when working with goal-directed behaviors to move in a direction that supports her overall improved health and wellbeing.  Laurie Simmons has made some progress and definitely needs to continue working with her goal-directed behaviors to move in a more hopeful and healthier direction.  Goal review and progress/challenges noted with patient.  Next appointment within 3 to 4 weeks.   Kelleen Patee, LCSW

## 2023-11-20 ENCOUNTER — Telehealth (INDEPENDENT_AMBULATORY_CARE_PROVIDER_SITE_OTHER): Admitting: Adult Health

## 2023-11-20 ENCOUNTER — Encounter: Payer: Self-pay | Admitting: Adult Health

## 2023-11-20 DIAGNOSIS — F410 Panic disorder [episodic paroxysmal anxiety] without agoraphobia: Secondary | ICD-10-CM

## 2023-11-20 DIAGNOSIS — G4700 Insomnia, unspecified: Secondary | ICD-10-CM | POA: Diagnosis not present

## 2023-11-20 DIAGNOSIS — F411 Generalized anxiety disorder: Secondary | ICD-10-CM | POA: Diagnosis not present

## 2023-11-20 DIAGNOSIS — F331 Major depressive disorder, recurrent, moderate: Secondary | ICD-10-CM | POA: Diagnosis not present

## 2023-11-20 MED ORDER — CARIPRAZINE HCL 1.5 MG PO CAPS
1.5000 mg | ORAL_CAPSULE | Freq: Every day | ORAL | 2 refills | Status: DC
Start: 2023-11-20 — End: 2024-02-20

## 2023-11-20 NOTE — Progress Notes (Signed)
 Laurie Simmons [id number] [birth year] 59 y.o.  Virtual Visit via Video Note  I connected with pt @ on 11/20/23 at  3:30 PM EDT by a video enabled telemedicine application and verified that I am speaking with the correct person using two identifiers.   I discussed the limitations of evaluation and management by telemedicine and the availability of in person appointments. The patient expressed understanding and agreed to proceed.  I discussed the assessment and treatment plan with the patient. The patient was provided an opportunity to ask questions and all were answered. The patient agreed with the plan and demonstrated an understanding of the instructions.   The patient was advised to call back or seek an in-person evaluation if the symptoms worsen or if the condition fails to improve as anticipated.  I provided 25 minutes of non-face-to-face time during this encounter.  The patient was located at home.  The provider was located at [facility].   Reagan Camera, NP   Subjective:   Patient ID:  Laurie Simmons is a 58 y.o. (DOB [birth year]) female.  Chief Complaint: No chief complaint on file.   HPI Laurie Simmons presents for follow-up of anxiety, panic attacks, depression, insomnia.  Describes mood today as "so-so". Pleasant. Denies tearfulness. Mood symptoms - reports less depressive symptoms. Reports improved interest, but lacks motivation.  Reports increased anxiety at times. Denies irritability. Reports a few panic attacks. Reports feeling overwhelmed. Reports worry, rumination and over thinking. Reports increased situational stressors - mother being tested for memory issues. Reports mood is more stable than it was. Stating "I feel like I'm doing a little better". Feels like current medications are helpful. Taking medications as prescribed.  Energy levels lower. Active, does not have a regular exercise routine.  Enjoys some usual interests and activities. Married. Lives with husband  and 2 children. Appetite adequate. Reports weight loss. Sleeping well most nights. Averages 7 hours. Reports occasional daytime napping. Focus and concentration difficulties - "not too good". Completing tasks. Managing aspects of household. Retired. Denies AH or VH. Denies SI or HI. Denies self-harm. Denies substance use.  Therapist - Reid Capuchin.  Previous medications: Abilify   Review of Systems:  Review of Systems  Musculoskeletal:  Negative for gait problem.  Neurological:  Negative for tremors.  Psychiatric/Behavioral:         Please refer to HPI    Medications: I have reviewed the patient's current medications.  Current Outpatient Medications  Medication Sig Dispense Refill   AIRSUPRA  90-80 MCG/ACT AERO Inhale 2 puffs into the lungs as needed (every 4 to 6 hours for cough, wheeze, shortness of breath.  Rinse, mouth after use). 10.7 g 1   anastrozole  (ARIMIDEX ) 1 MG tablet TAKE 1 TABLET(1 MG) BY MOUTH DAILY (Patient taking differently: Take 1 mg by mouth daily.) 90 tablet 3   aspirin  EC 81 MG tablet Take 1 tablet (81 mg total) by mouth daily. Swallow whole. 90 tablet 3   Biotin 40981 MCG TBDP Take 10,000 mcg by mouth daily.     calcium  carbonate (TUMS EX) 750 MG chewable tablet Chew 1,500 mg by mouth daily as needed for heartburn.     cariprazine  (VRAYLAR ) 1.5 MG capsule Take 1 capsule (1.5 mg total) by mouth daily. 30 capsule 2   clorazepate  (TRANXENE ) 3.75 MG tablet Take one tablet daily as needed for anxiety/panic attacks. (Patient taking differently: Take 3.75 mg by mouth at bedtime as needed for anxiety. Take one tablet daily as needed for anxiety/panic attacks.)  30 tablet 2   EPINEPHrine  (EPIPEN  2-PAK) 0.3 mg/0.3 mL IJ SOAJ injection Use as directed for life-threatening allergic reaction. (Patient taking differently: Inject 0.3 mg into the muscle as needed for anaphylaxis. Use as directed for life-threatening allergic reaction.) 1 each 3   famotidine  (PEPCID ) 40 MG tablet  TAKE 1 TABLET BY MOUTH TWICE DAILY AS DIRECTED 60 tablet 5   FARXIGA 10 MG TABS tablet Take 10 mg by mouth daily.     fluticasone  (FLONASE ) 50 MCG/ACT nasal spray Place 2 sprays into both nostrils daily as needed for allergies or rhinitis.     folic acid  (FOLVITE ) 1 MG tablet TAKE 1 TABLET BY MOUTH DAILY 30 tablet 5   gabapentin  (NEURONTIN ) 300 MG capsule Take 1 capsule (300 mg total) by mouth 2 (two) times daily. (Patient taking differently: Take 300 mg by mouth at bedtime.) 60 capsule 3   loratadine  (CLARITIN ) 10 MG tablet TAKE 1 TABLET BY MOUTH ONCE DAILY AS NEEDED 90 tablet 1   MOUNJARO 7.5 MG/0.5ML Pen Inject 7.5 mg into the skin once a week.     ondansetron  (ZOFRAN ) 4 MG tablet TAKE 1 TABLET BY MOUTH EVERY 4 HOURS AS NEEDED FOR NAUSEA (Patient taking differently: Take 4 mg by mouth every 4 (four) hours as needed for nausea or vomiting.) 30 tablet 0   potassium chloride  (KLOR-CON  M) 10 MEQ tablet TAKE 2 TABLETS(20 MEQ) BY MOUTH TWICE DAILY 120 tablet 5   prochlorperazine  (COMPAZINE ) 10 MG tablet TAKE 1 TABLET BY MOUTH EVERY 6 HOURS AS NEEDED FOR NAUSEA (Patient taking differently: Take 10 mg by mouth every 6 (six) hours as needed for nausea or vomiting.) 90 tablet 1   rosuvastatin  (CRESTOR ) 20 MG tablet TAKE 1 TABLET(20 MG) BY MOUTH DAILY (Patient taking differently: Take 20 mg by mouth daily.) 90 tablet 2   senna-docusate (SENOKOT-S) 8.6-50 MG tablet Take 2 tablets by mouth daily.     topiramate  (TOPAMAX ) 50 MG tablet Take 1 tablet (50 mg total) by mouth daily. 90 tablet 3   UNABLE TO FIND Inject 1 each as directed See admin instructions. Allergy injections for cats, grass, and dust.     valsartan -hydrochlorothiazide (DIOVAN -HCT) 320-25 MG tablet Take 1 tablet by mouth daily.  0   Vilazodone  HCl (VIIBRYD ) 40 MG TABS Take 1 tablet (40 mg total) by mouth daily. TAKE 1 TABLET(40 MG) BY MOUTH DAILY 90 tablet 3   WIXELA INHUB 250-50 MCG/ACT AEPB INHALE 1 PUFF BY MOUTH TWICE DAILY FOR COUGH OR  WHEEZE. RINSE MOUTH AFTER USE (Patient taking differently: Inhale 1 puff into the lungs in the morning and at bedtime.) 60 each 1   No current facility-administered medications for this visit.    Medication Side Effects: None  Allergies:  Allergies  Allergen Reactions   Effexor  [Venlafaxine ] Palpitations   Nsaids Other (See Comments)    Pt reports kidney disease  Pt reports kidney disease, Pt reports kidney disease    Past Medical History:  Diagnosis Date   Abnormal stress test: Anterior wall ischemia cardiac cath after that showing 25% RCA no LAD disease 02/12/2022   Acquired absence of left breast 08/18/2020   Allergic rhinitis 03/10/2015   Anxiety    Anxiety and depression 08/30/2021   Arthritis    Asthma    Bilateral carpal tunnel syndrome 08/25/2018   BMI 40.0-44.9, adult (HCC) 08/23/2019   Breast cancer (HCC) 2021   Carpal tunnel syndrome    Carpal tunnel syndrome    bilateral   Chronic kidney  disease    stage 3   Depression    Diabetes (HCC)    Dyslipidemia 03/27/2022   Essential hypertension 02/12/2022   Fatigue 08/30/2021   Fibroid 05/04/2019   Fibroids, intramural 08/09/2019   GERD (gastroesophageal reflux disease)    Hemodynamic instability 03/10/2015   Hypertension    Idiopathic urticaria 03/10/2015   Iron deficiency anemia due to chronic blood loss 08/09/2019   Irritable bowel syndrome    Malignant neoplasm of female breast (HCC) 04/17/2022   Moderate persistent asthma 03/10/2015   Obesity    Pain in both hands 07/16/2018   PMB (postmenopausal bleeding) 04/17/2022   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   Severe anemia 12/14/2015   Type 2 diabetes mellitus without complication, without long-term current use of insulin (HCC) 02/12/2022    Family History  Problem Relation Age of Onset   Kidney disease Mother    Pulmonary fibrosis Father    Cancer Maternal Grandmother        carcinoma of the vulva and possible  ovarian cancer   Prostate cancer Maternal Grandfather    Lung cancer Paternal Grandfather        heavy smoker    Social History   Socioeconomic History   Marital status: Married    Spouse name: Phil   Number of children: 2   Years of education: Not on file   Highest education level: Not on file  Occupational History   Not on file  Tobacco Use   Smoking status: Never   Smokeless tobacco: Never  Vaping Use   Vaping status: Never Used  Substance and Sexual Activity   Alcohol use: No   Drug use: No   Sexual activity: Yes  Other Topics Concern   Not on file  Social History Narrative   Not on file   Social Drivers of Health   Financial Resource Strain: Not on file  Food Insecurity: Not on file  Transportation Needs: Not on file  Physical Activity: Not on file  Stress: Not on file  Social Connections: Not on file  Intimate Partner Violence: Not on file    Past Medical History, Surgical history, Social history, and Family history were reviewed and updated as appropriate.   Please see review of systems for further details on the patient's review from today.   Objective:   Physical Exam:  LMP 01/23/2011   Physical Exam Constitutional:      General: She is not in acute distress. Musculoskeletal:        General: No deformity.  Neurological:     Mental Status: She is alert and oriented to person, place, and time.     Coordination: Coordination normal.  Psychiatric:        Attention and Perception: Attention and perception normal. She does not perceive auditory or visual hallucinations.        Mood and Affect: Affect is not labile, blunt, angry or inappropriate.        Speech: Speech normal.        Behavior: Behavior normal.        Thought Content: Thought content normal. Thought content is not paranoid or delusional. Thought content does not include homicidal or suicidal ideation. Thought content does not include homicidal or suicidal plan.        Cognition and  Memory: Cognition and memory normal.        Judgment: Judgment normal.     Comments: Insight intact     Lab Review:  Component Value Date/Time   NA 138 09/26/2023 1527   NA 137 02/12/2022 1035   K 3.8 09/26/2023 1527   CL 102 09/26/2023 1527   CO2 24 09/26/2023 1527   GLUCOSE 118 (H) 09/26/2023 1527   BUN 24 (H) 09/26/2023 1527   BUN 16 02/12/2022 1035   CREATININE 1.49 (H) 09/26/2023 1527   CALCIUM  9.8 09/26/2023 1527   PROT 7.7 09/26/2023 1527   ALBUMIN 4.4 09/26/2023 1527   AST 18 09/26/2023 1527   ALT 15 09/26/2023 1527   ALKPHOS 112 09/26/2023 1527   BILITOT 0.3 09/26/2023 1527   GFRNONAA 41 (L) 09/26/2023 1527   GFRAA  10/20/2008 2105    >60        The eGFR has been calculated using the MDRD equation. This calculation has not been validated in all clinical situations. eGFR's persistently <60 mL/min signify possible Chronic Kidney Disease.       Component Value Date/Time   WBC 10.6 (H) 09/26/2023 1527   WBC 12.3 (H) 10/22/2008 0530   RBC 5.21 (H) 09/26/2023 1527   HGB 14.7 09/26/2023 1527   HGB 15.0 02/12/2022 1035   HCT 43.3 09/26/2023 1527   HCT 43.9 02/12/2022 1035   PLT 336 09/26/2023 1527   PLT 329 02/12/2022 1035   MCV 83.1 09/26/2023 1527   MCV 85 02/12/2022 1035   MCV 87 11/07/2020 0000   MCH 28.2 09/26/2023 1527   MCHC 33.9 09/26/2023 1527   RDW 13.7 09/26/2023 1527   RDW 13.1 02/12/2022 1035   LYMPHSABS 2.3 09/26/2023 1527   LYMPHSABS 1.6 02/12/2022 1035   MONOABS 0.6 09/26/2023 1527   EOSABS 0.4 09/26/2023 1527   EOSABS 0.2 02/12/2022 1035   BASOSABS 0.1 09/26/2023 1527   BASOSABS 0.1 02/12/2022 1035    No results found for: "POCLITH", "LITHIUM"   No results found for: "PHENYTOIN", "PHENOBARB", "VALPROATE", "CBMZ"   .res Assessment: Plan:    Plan:  1. Viibryd  40mg  daily 2. Tranxene  3.75mg  once daily 3. Topamax  50 mg daily 4. Vraylar  1.5mg  daily  RTC 3 months  25 minutes spent dedicated to the care of this patient on  the date of this encounter to include pre-visit review of records, ordering of medication, post visit documentation, and face-to-face time with the patient anxiety, panic attacks, depression, insomnia. Discussed changing current medication regimen.  Patient advised to contact office with any questions, adverse effects, or acute worsening in signs and symptoms.  Discussed potential benefits, risk, and side effects of benzodiazepines to include potential risk of tolerance and dependence, as well as possible drowsiness. Advised patient not to drive if experiencing drowsiness and to take lowest possible effective dose to minimize risk of dependence and tolerance.  Discussed potential metabolic side effects associated with atypical antipsychotics, as well as potential risk for movement side effects. Advised pt to contact office if movement side effects occur.    Diagnoses and all orders for this visit:  Major depressive disorder, recurrent episode, moderate (HCC) -     cariprazine  (VRAYLAR ) 1.5 MG capsule; Take 1 capsule (1.5 mg total) by mouth daily.  Generalized anxiety disorder  Insomnia, unspecified type  Panic attacks     Please see After Visit Summary for patient specific instructions.  Future Appointments  Date Time Provider Department Center  12/25/2023 10:20 AM Jerelene Monday, Rema Care, MD [facility] [place] None  12/29/2023 10:00 AM Kelleen Patee, LCSW [facility] None  01/26/2024 10:00 AM Kelleen Patee, LCSW [facility] None  03/17/2024 10:00 AM Jodi Munroe, NP [facility]  [facility]  10/08/2024  3:45 PM [facility] None  10/08/2024  4:30 PM Nolia Baumgartner, MD [facility] None    No orders of the defined types were placed in this encounter.     -------------------------------

## 2023-11-24 DIAGNOSIS — Z6837 Body mass index (BMI) 37.0-37.9, adult: Secondary | ICD-10-CM | POA: Diagnosis not present

## 2023-11-24 DIAGNOSIS — R197 Diarrhea, unspecified: Secondary | ICD-10-CM | POA: Diagnosis not present

## 2023-11-24 DIAGNOSIS — R112 Nausea with vomiting, unspecified: Secondary | ICD-10-CM | POA: Diagnosis not present

## 2023-12-02 DIAGNOSIS — J301 Allergic rhinitis due to pollen: Secondary | ICD-10-CM | POA: Diagnosis not present

## 2023-12-02 NOTE — Progress Notes (Signed)
 VIALS MADE 12-02-23

## 2023-12-03 DIAGNOSIS — J3089 Other allergic rhinitis: Secondary | ICD-10-CM | POA: Diagnosis not present

## 2023-12-04 DIAGNOSIS — J3081 Allergic rhinitis due to animal (cat) (dog) hair and dander: Secondary | ICD-10-CM | POA: Diagnosis not present

## 2023-12-06 ENCOUNTER — Other Ambulatory Visit: Payer: Self-pay | Admitting: Cardiology

## 2023-12-10 ENCOUNTER — Ambulatory Visit (INDEPENDENT_AMBULATORY_CARE_PROVIDER_SITE_OTHER): Payer: Self-pay

## 2023-12-10 DIAGNOSIS — Z6837 Body mass index (BMI) 37.0-37.9, adult: Secondary | ICD-10-CM | POA: Diagnosis not present

## 2023-12-10 DIAGNOSIS — G8929 Other chronic pain: Secondary | ICD-10-CM | POA: Diagnosis not present

## 2023-12-10 DIAGNOSIS — I10 Essential (primary) hypertension: Secondary | ICD-10-CM | POA: Diagnosis not present

## 2023-12-10 DIAGNOSIS — M549 Dorsalgia, unspecified: Secondary | ICD-10-CM | POA: Diagnosis not present

## 2023-12-10 DIAGNOSIS — J309 Allergic rhinitis, unspecified: Secondary | ICD-10-CM

## 2023-12-10 DIAGNOSIS — N1831 Chronic kidney disease, stage 3a: Secondary | ICD-10-CM | POA: Diagnosis not present

## 2023-12-10 DIAGNOSIS — E1169 Type 2 diabetes mellitus with other specified complication: Secondary | ICD-10-CM | POA: Diagnosis not present

## 2023-12-10 DIAGNOSIS — C779 Secondary and unspecified malignant neoplasm of lymph node, unspecified: Secondary | ICD-10-CM | POA: Diagnosis not present

## 2023-12-10 DIAGNOSIS — E785 Hyperlipidemia, unspecified: Secondary | ICD-10-CM | POA: Diagnosis not present

## 2023-12-16 DIAGNOSIS — Z6837 Body mass index (BMI) 37.0-37.9, adult: Secondary | ICD-10-CM | POA: Diagnosis not present

## 2023-12-16 DIAGNOSIS — N1832 Chronic kidney disease, stage 3b: Secondary | ICD-10-CM | POA: Diagnosis not present

## 2023-12-16 DIAGNOSIS — I10 Essential (primary) hypertension: Secondary | ICD-10-CM | POA: Diagnosis not present

## 2023-12-16 DIAGNOSIS — E785 Hyperlipidemia, unspecified: Secondary | ICD-10-CM | POA: Diagnosis not present

## 2023-12-16 DIAGNOSIS — E1169 Type 2 diabetes mellitus with other specified complication: Secondary | ICD-10-CM | POA: Diagnosis not present

## 2023-12-23 ENCOUNTER — Ambulatory Visit: Admitting: Psychiatry

## 2023-12-23 ENCOUNTER — Other Ambulatory Visit: Payer: Self-pay

## 2023-12-23 DIAGNOSIS — C50912 Malignant neoplasm of unspecified site of left female breast: Secondary | ICD-10-CM

## 2023-12-23 MED ORDER — ANASTROZOLE 1 MG PO TABS: ORAL_TABLET | ORAL | 3 refills | Status: AC

## 2023-12-25 ENCOUNTER — Ambulatory Visit (INDEPENDENT_AMBULATORY_CARE_PROVIDER_SITE_OTHER): Payer: BC Managed Care – PPO | Admitting: Allergy and Immunology

## 2023-12-25 VITALS — BP 118/76 | HR 99 | Resp 16

## 2023-12-25 DIAGNOSIS — R197 Diarrhea, unspecified: Secondary | ICD-10-CM | POA: Diagnosis not present

## 2023-12-25 DIAGNOSIS — K219 Gastro-esophageal reflux disease without esophagitis: Secondary | ICD-10-CM | POA: Diagnosis not present

## 2023-12-25 DIAGNOSIS — J3089 Other allergic rhinitis: Secondary | ICD-10-CM | POA: Diagnosis not present

## 2023-12-25 DIAGNOSIS — J4540 Moderate persistent asthma, uncomplicated: Secondary | ICD-10-CM | POA: Diagnosis not present

## 2023-12-25 NOTE — Patient Instructions (Addendum)
  1. Continue Wixela 250 - 1 inhalations 1-2 times per day    2. Continue Flonase  - 1 spray each nostril 3-7 times per week  3. Continue famotidine  40 mg -1 tablet 2 times per day  4. Continue immunotherapy and EpiPen   5. If needed:   A. Airsupra  - 2 inhalations every 6 hours (replaces albuterol )  B. OTC antihistamime  C. Nasal saline  6. Evaluate gut infection:   A. Stool for H. Pylori antigen, C. Diff Toxin, stool pathogens PCR Test Number [id number], Giardia lamblia EIA and Ova and Parasites Examination Test Number [id number]  7. Return to clinic in 6 months or earlier if problem  8. Influenza = Tamiflu. Covid = Paxlovid

## 2023-12-25 NOTE — Progress Notes (Signed)
 [place] - [place] - [place] - [place] - [place]   Follow-up Note  Referring Provider: Lonie Roa, MD Primary Provider: Lonie Roa, MD Date of Office Visit: 12/25/2023  Subjective:   Laurie Simmons (DOB: 1966-06-28) is a 58 y.o. female who [facility] in re-evaluation of the following:  HPI: Laurie Simmons returns to this clinic in evaluation of asthma, allergic rhinoconjunctivitis, reflux.  I last saw her in this clinic 26 June 2023.  She is doing very well regarding her airway without any significant problems involving her upper or lower airway and no need to use a systemic steroid or antibiotic for any type of airway issue and rare use of the short acting bronchodilator no limitation on ability to exercise while she continues to use immunotherapy currently at every 4 weeks without any adverse effect, combination inhaler just 1 time per day, and Flonase  a few times per week.  When I last saw her in this clinic she was having problems with her reflux which appeared to be secondary to some unusual eating around Thanksgiving but that issue appeared to have resolved but unfortunately in March she developed significant gastroenteritis as did 2 other members of the family with vomiting and diarrhea.  Since that point in time she has had recurrent nausea and diarrhea about every 2 weeks.  Apparently a Helicobacter pylori urease breath test was negative but this test was performed in the context of using famotidine .  Allergies as of 12/25/2023       Reactions   Effexor  [venlafaxine ] Palpitations   Nsaids Other (See Comments)   Pt reports kidney disease Pt reports kidney disease, Pt reports kidney disease        Medication List    Airsupra  90-80 MCG/ACT Aero Generic drug: Albuterol -Budesonide  Inhale 2 puffs into the lungs as needed (every 4 to 6 hours for cough, wheeze, shortness of breath.  Rinse, mouth after use).   anastrozole  1 MG  tablet Commonly known as: ARIMIDEX  TAKE 1 TABLET(1 MG) BY MOUTH DAILY   aspirin  EC 81 MG tablet Take 1 tablet (81 mg total) by mouth daily. Swallow whole.   Biotin 40981 MCG Tbdp Take 10,000 mcg by mouth daily.   calcium  carbonate 750 MG chewable tablet Commonly known as: TUMS EX Chew 1,500 mg by mouth daily as needed for heartburn.   cariprazine  1.5 MG capsule Commonly known as: Vraylar  Take 1 capsule (1.5 mg total) by mouth daily.   clorazepate  3.75 MG tablet Commonly known as: TRANXENE  Take one tablet daily as needed for anxiety/panic attacks.   EPINEPHrine  0.3 mg/0.3 mL Soaj injection Commonly known as: EpiPen  2-Pak Use as directed for life-threatening allergic reaction.   famotidine  40 MG tablet Commonly known as: PEPCID  TAKE 1 TABLET BY MOUTH TWICE DAILY AS DIRECTED   Farxiga 10 MG Tabs tablet Generic drug: dapagliflozin propanediol Take 10 mg by mouth daily.   fluticasone  50 MCG/ACT nasal spray Commonly known as: FLONASE  Place 2 sprays into both nostrils daily as needed for allergies or rhinitis.   folic acid  1 MG tablet Commonly known as: FOLVITE  TAKE 1 TABLET BY MOUTH DAILY   gabapentin  300 MG capsule Commonly known as: NEURONTIN  Take 1 capsule (300 mg total) by mouth 2 (two) times daily.   loratadine  10 MG tablet Commonly known as: CLARITIN  TAKE 1 TABLET BY MOUTH ONCE DAILY AS NEEDED   Mounjaro 7.5 MG/0.5ML Pen Generic drug: tirzepatide Inject 7.5 mg into the skin once a week.  ondansetron  4 MG tablet Commonly known as: ZOFRAN  TAKE 1 TABLET BY MOUTH EVERY 4 HOURS AS NEEDED FOR NAUSEA   potassium chloride  10 MEQ tablet Commonly known as: KLOR-CON  M TAKE 2 TABLETS(20 MEQ) BY MOUTH TWICE DAILY   prochlorperazine  10 MG tablet Commonly known as: COMPAZINE  TAKE 1 TABLET BY MOUTH EVERY 6 HOURS AS NEEDED FOR NAUSEA   rosuvastatin  20 MG tablet Commonly known as: CRESTOR  TAKE 1 TABLET(20 MG) BY MOUTH DAILY   senna-docusate 8.6-50 MG  tablet Commonly known as: Senokot-S Take 2 tablets by mouth daily.   topiramate  50 MG tablet Commonly known as: TOPAMAX  Take 1 tablet (50 mg total) by mouth daily.   UNABLE TO FIND Inject 1 each as directed See admin instructions. Allergy injections for cats, grass, and dust.   valsartan -hydrochlorothiazide 320-25 MG tablet Commonly known as: DIOVAN -HCT Take 1 tablet by mouth daily.   Vilazodone  HCl 40 MG Tabs Commonly known as: VIIBRYD  Take 1 tablet (40 mg total) by mouth daily. TAKE 1 TABLET(40 MG) BY MOUTH DAILY   Wixela Inhub 250-50 MCG/ACT Aepb Generic drug: fluticasone -salmeterol INHALE 1 PUFF BY MOUTH TWICE DAILY FOR COUGH OR WHEEZE. RINSE MOUTH AFTER USE    Past Medical History:  Diagnosis Date   Abnormal stress test: Anterior wall ischemia cardiac cath after that showing 25% RCA no LAD disease 02/12/2022   Acquired absence of left breast 08/18/2020   Allergic rhinitis 03/10/2015   Anxiety    Anxiety and depression 08/30/2021   Arthritis    Asthma    Bilateral carpal tunnel syndrome 08/25/2018   BMI 40.0-44.9, adult (HCC) 08/23/2019   Breast cancer (HCC) 2021   Carpal tunnel syndrome    Carpal tunnel syndrome    bilateral   Chronic kidney disease    stage 3   Depression    Diabetes (HCC)    Dyslipidemia 03/27/2022   Essential hypertension 02/12/2022   Fatigue 08/30/2021   Fibroid 05/04/2019   Fibroids, intramural 08/09/2019   GERD (gastroesophageal reflux disease)    Hemodynamic instability 03/10/2015   Hypertension    Idiopathic urticaria 03/10/2015   Iron deficiency anemia due to chronic blood loss 08/09/2019   Irritable bowel syndrome    Malignant neoplasm of female breast (HCC) 04/17/2022   Moderate persistent asthma 03/10/2015   Obesity    Pain in both hands 07/16/2018   PMB (postmenopausal bleeding) 04/17/2022   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   Severe anemia 12/14/2015   Type 2 diabetes mellitus without  complication, without long-term current use of insulin (HCC) 02/12/2022    Past Surgical History:  Procedure Laterality Date   caesarean     CESAREAN SECTION     CHOLECYSTECTOMY     DILATION AND CURETTAGE OF UTERUS     HIP SURGERY Left    LEFT HEART CATH AND CORONARY ANGIOGRAPHY N/A 02/13/2022   Procedure: LEFT HEART CATH AND CORONARY ANGIOGRAPHY;  Surgeon: Lucendia Rusk, MD;  Location: [facility];  Service: Cardiovascular;  Laterality: N/A;   MASTECTOMY     PORT A CATH INJECTION ([facility] HX)  2021    Review of systems negative except as noted in HPI / PMHx or noted below:  Review of Systems  Constitutional: Negative.   HENT: Negative.    Eyes: Negative.   Respiratory: Negative.    Cardiovascular: Negative.   Gastrointestinal: Negative.   Genitourinary: Negative.   Musculoskeletal: Negative.   Skin: Negative.   Neurological: Negative.   Endo/Heme/Allergies: Negative.  Psychiatric/Behavioral: Negative.       Objective:   Vitals:   12/25/23 1021  BP: 118/76  Pulse: 99  Resp: 16  SpO2: 98%          Physical Exam Constitutional:      Appearance: She is not diaphoretic.  HENT:     Head: Normocephalic.     Right Ear: Tympanic membrane, ear canal and external ear normal.     Left Ear: Tympanic membrane, ear canal and external ear normal.     Nose: Nose normal. No mucosal edema or rhinorrhea.     Mouth/Throat:     Pharynx: Uvula midline. No oropharyngeal exudate.   Eyes:     Conjunctiva/sclera: Conjunctivae normal.   Neck:     Thyroid : No thyromegaly.     Trachea: Trachea normal. No tracheal tenderness or tracheal deviation.   Cardiovascular:     Rate and Rhythm: Normal rate and regular rhythm.     Heart sounds: Normal heart sounds, S1 normal and S2 normal. No murmur heard. Pulmonary:     Effort: No respiratory distress.     Breath sounds: Normal breath sounds. No stridor. No wheezing or rales.  Lymphadenopathy:     Head:     Right side of  head: No tonsillar adenopathy.     Left side of head: No tonsillar adenopathy.     Cervical: No cervical adenopathy.   Skin:    Findings: No erythema or rash.     Nails: There is no clubbing.   Neurological:     Mental Status: She is alert.     Diagnostics: Spirometry was performed and demonstrated an FEV1 of 2,87 at 111 % of predicted.  Assessment and Plan:   1. Asthma, moderate persistent, well-controlled   2. Other allergic rhinitis   3. LPRD (laryngopharyngeal reflux disease)   4. Diarrhea of presumed infectious origin     1. Continue Wixela 250 - 1 inhalations 1-2 times per day    2. Continue Flonase  - 1 spray each nostril 3-7 times per week  3. Continue famotidine  40 mg -1 tablet 2 times per day  4. Continue immunotherapy and EpiPen   5. If needed:   A. Airsupra  - 2 inhalations every 6 hours (replaces albuterol )  B. OTC antihistamime  C. Nasal saline  6. Evaluate gut infection:   A. Stool for H. Pylori antigen, C. Diff Toxin, stool pathogens PCR Test Number [id number], Giardia lamblia EIA and Ova and Parasites Examination Test Number [id number]  7. Return to clinic in 6 months or earlier if problem  8. Influenza = Tamiflu. Covid = Paxlovid  Laurie Simmons appears to be doing pretty well regarding her airway on her current plan and she will continue on immunotherapy and anti-inflammatory agents for both her upper and lower airway as noted above always aiming for the least amount of medication required to control her disease state.  And she has some type of gastrointestinal issue that is ongoing complicating her reflux and we will check her for an infectious form of GI inflammation with the stool test noted above.  I will contact her with the results of the studies once they are available for review.  Schuyler Custard, MD Allergy / Immunology [facility]

## 2023-12-26 ENCOUNTER — Other Ambulatory Visit: Payer: Self-pay | Admitting: Allergy and Immunology

## 2023-12-26 DIAGNOSIS — A048 Other specified bacterial intestinal infections: Secondary | ICD-10-CM | POA: Diagnosis not present

## 2023-12-26 DIAGNOSIS — R197 Diarrhea, unspecified: Secondary | ICD-10-CM | POA: Diagnosis not present

## 2023-12-28 LAB — GI PROFILE, STOOL, PCR
Adenovirus F 40/41: NOT DETECTED
Astrovirus: NOT DETECTED
C difficile toxin A/B: NOT DETECTED
Campylobacter: NOT DETECTED
Cryptosporidium: NOT DETECTED
Cyclospora cayetanensis: NOT DETECTED
Entamoeba histolytica: NOT DETECTED
Enteroaggregative E coli: NOT DETECTED
Enteropathogenic E coli: NOT DETECTED
Enterotoxigenic E coli: NOT DETECTED
Giardia lamblia: NOT DETECTED
Norovirus GI/GII: DETECTED — AB
Plesiomonas shigelloides: DETECTED — AB
Rotavirus A: NOT DETECTED
Salmonella: NOT DETECTED
Sapovirus: NOT DETECTED
Shiga-toxin-producing E coli: NOT DETECTED
Shigella/Enteroinvasive E coli: NOT DETECTED
Vibrio cholerae: NOT DETECTED
Vibrio: NOT DETECTED
Yersinia enterocolitica: NOT DETECTED

## 2023-12-28 LAB — H. PYLORI ANTIGEN, STOOL: H pylori Ag, Stl: NEGATIVE

## 2023-12-29 ENCOUNTER — Other Ambulatory Visit: Payer: Self-pay | Admitting: *Deleted

## 2023-12-29 ENCOUNTER — Ambulatory Visit: Admitting: Psychiatry

## 2023-12-29 ENCOUNTER — Ambulatory Visit: Payer: Self-pay | Admitting: Allergy and Immunology

## 2023-12-29 ENCOUNTER — Encounter: Payer: Self-pay | Admitting: Allergy and Immunology

## 2023-12-29 MED ORDER — CIPROFLOXACIN HCL 500 MG PO TABS: ORAL_TABLET | ORAL | 0 refills | Status: DC

## 2023-12-30 LAB — C DIFFICILE, CYTOTOXIN B

## 2023-12-30 LAB — C DIFFICILE TOXINS A+B W/RFLX: C difficile Toxins A+B, EIA: NEGATIVE

## 2024-01-06 ENCOUNTER — Ambulatory Visit (INDEPENDENT_AMBULATORY_CARE_PROVIDER_SITE_OTHER)

## 2024-01-06 DIAGNOSIS — J309 Allergic rhinitis, unspecified: Secondary | ICD-10-CM

## 2024-01-09 ENCOUNTER — Telehealth: Payer: Self-pay | Admitting: Allergy and Immunology

## 2024-01-09 DIAGNOSIS — R197 Diarrhea, unspecified: Secondary | ICD-10-CM

## 2024-01-09 NOTE — Telephone Encounter (Signed)
 These symptoms sound consistent with GERD.  Recommend trial of pepcid  20mg  twice daily.

## 2024-01-09 NOTE — Telephone Encounter (Signed)
 Patient states she finished taking Ciprofloxacin on Tuesday for the bacterial infection she had. She felt fine until today. She is having stomach bloating, nausea, diarrhea, and is burping up a bad taste. I informed her Dr. Maurilio is out of office today and will be back Monday. She does not want to go through the weekend with these symptoms so she wants us  to send a message to Dr. Lorin. She would also like to let Dr. Maurilio know of what's going on.

## 2024-01-09 NOTE — Telephone Encounter (Signed)
 Patient informed and states she already takes pedcid twice daily. She is requesting for me to  forward this message to Dr. Kozlow

## 2024-01-12 ENCOUNTER — Other Ambulatory Visit: Payer: Self-pay | Admitting: Physical Medicine and Rehabilitation

## 2024-01-12 DIAGNOSIS — M545 Low back pain, unspecified: Secondary | ICD-10-CM

## 2024-01-12 DIAGNOSIS — R1032 Left lower quadrant pain: Secondary | ICD-10-CM

## 2024-01-12 DIAGNOSIS — M2170 Unequal limb length (acquired), unspecified site: Secondary | ICD-10-CM

## 2024-01-12 DIAGNOSIS — G894 Chronic pain syndrome: Secondary | ICD-10-CM

## 2024-01-14 NOTE — Telephone Encounter (Signed)
 Patient states she is doing better now but the symptoms will come back in a few weeks. She states that's what usually happens.  She is only taking pepcid  bid and not any other meds for reflux.

## 2024-01-15 NOTE — Telephone Encounter (Signed)
 Patient states her symptoms are back. She is having the nausea, diarrhea, bloating, and burping.

## 2024-01-15 NOTE — Addendum Note (Signed)
 Addended by: CASIMIR CHIQUITA POUR on: 01/15/2024 11:31 AM   Modules accepted: Orders

## 2024-01-15 NOTE — Telephone Encounter (Signed)
 Talked with patient.  She saw Dr. Charlanne years ago but does not have a current gastroenterologist. Sent ASAP referral to Dr. Charlanne via [facility].

## 2024-01-21 ENCOUNTER — Ambulatory Visit (INDEPENDENT_AMBULATORY_CARE_PROVIDER_SITE_OTHER): Admitting: Gastroenterology

## 2024-01-21 ENCOUNTER — Encounter: Payer: Self-pay | Admitting: Gastroenterology

## 2024-01-21 VITALS — BP 130/80 | HR 105 | Ht 65.0 in | Wt 229.0 lb

## 2024-01-21 DIAGNOSIS — K589 Irritable bowel syndrome without diarrhea: Secondary | ICD-10-CM | POA: Diagnosis not present

## 2024-01-21 NOTE — Progress Notes (Unsigned)
 HPI :           Past Medical History:  Diagnosis Date   Abnormal stress test: Anterior wall ischemia cardiac cath after that showing 25% RCA no LAD disease 02/12/2022   Acquired absence of left breast 08/18/2020   Allergic rhinitis 03/10/2015   Anxiety    Anxiety and depression 08/30/2021   Arthritis    Asthma    Bilateral carpal tunnel syndrome 08/25/2018   BMI 40.0-44.9, adult (HCC) 08/23/2019   Breast cancer (HCC) 2021   Carpal tunnel syndrome    Carpal tunnel syndrome    bilateral   Chronic kidney disease    stage 3   Depression    Diabetes (HCC)    Dyslipidemia 03/27/2022   Essential hypertension 02/12/2022   Fatigue 08/30/2021   Fibroid 05/04/2019   Fibroids, intramural 08/09/2019   GERD (gastroesophageal reflux disease)    Hemodynamic instability 03/10/2015   Hypertension    Idiopathic urticaria 03/10/2015   Iron deficiency anemia due to chronic blood loss 08/09/2019   Irritable bowel syndrome    Malignant neoplasm of female breast (HCC) 04/17/2022   Moderate persistent asthma 03/10/2015   Obesity    Pain in both hands 07/16/2018   PMB (postmenopausal bleeding) 04/17/2022   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   Severe anemia 12/14/2015   Type 2 diabetes mellitus without complication, without long-term current use of insulin (HCC) 02/12/2022     Past Surgical History:  Procedure Laterality Date   caesarean     CESAREAN SECTION     CHOLECYSTECTOMY     DILATION AND CURETTAGE OF UTERUS     HIP SURGERY Left    LEFT HEART CATH AND CORONARY ANGIOGRAPHY N/A 02/13/2022   Procedure: LEFT HEART CATH AND CORONARY ANGIOGRAPHY;  Surgeon: Dann Candyce RAMAN, MD;  Location: [facility];  Service: Cardiovascular;  Laterality: N/A;   MASTECTOMY     PORT A CATH INJECTION ([facility] HX)  2021   Family History  Problem Relation Age of Onset   Kidney disease Mother    Pulmonary fibrosis Father    Cancer Maternal Grandmother         carcinoma of the vulva and possible ovarian cancer   Prostate cancer Maternal Grandfather    Lung cancer Paternal Grandfather        heavy smoker   Social History   Tobacco Use   Smoking status: Never   Smokeless tobacco: Never  Vaping Use   Vaping status: Never Used  Substance Use Topics   Alcohol use: No   Drug use: No   Current Outpatient Medications  Medication Sig Dispense Refill   AIRSUPRA  90-80 MCG/ACT AERO Inhale 2 puffs into the lungs as needed (every 4 to 6 hours for cough, wheeze, shortness of breath.  Rinse, mouth after use). 10.7 g 1   anastrozole  (ARIMIDEX ) 1 MG tablet TAKE 1 TABLET(1 MG) BY MOUTH DAILY 90 tablet 3   aspirin  EC 81 MG tablet Take 1 tablet (81 mg total) by mouth daily. Swallow whole. 90 tablet 3   Biotin 89999 MCG TBDP Take 10,000 mcg by mouth daily.     calcium  carbonate (TUMS EX) 750 MG chewable tablet Chew 1,500 mg by mouth daily as needed for heartburn.     cariprazine  (VRAYLAR ) 1.5 MG capsule Take 1 capsule (1.5 mg total) by mouth daily. 30 capsule 2   clorazepate  (TRANXENE ) 3.75 MG tablet Take one tablet daily as needed for anxiety/panic attacks. (Patient  taking differently: Take 3.75 mg by mouth at bedtime as needed for anxiety. Take one tablet daily as needed for anxiety/panic attacks.) 30 tablet 2   EPINEPHrine  (EPIPEN  2-PAK) 0.3 mg/0.3 mL IJ SOAJ injection Use as directed for life-threatening allergic reaction. (Patient taking differently: Inject 0.3 mg into the muscle as needed for anaphylaxis. Use as directed for life-threatening allergic reaction.) 1 each 3   famotidine  (PEPCID ) 40 MG tablet TAKE 1 TABLET BY MOUTH TWICE DAILY AS DIRECTED 60 tablet 5   FARXIGA 10 MG TABS tablet Take 10 mg by mouth daily.     fluticasone  (FLONASE ) 50 MCG/ACT nasal spray Place 2 sprays into both nostrils daily as needed for allergies or rhinitis.     folic acid  (FOLVITE ) 1 MG tablet TAKE 1 TABLET BY MOUTH DAILY 30 tablet 5   loratadine  (CLARITIN ) 10 MG  tablet TAKE 1 TABLET BY MOUTH ONCE DAILY AS NEEDED 90 tablet 1   MOUNJARO 7.5 MG/0.5ML Pen Inject 7.5 mg into the skin once a week.     ondansetron  (ZOFRAN ) 4 MG tablet TAKE 1 TABLET BY MOUTH EVERY 4 HOURS AS NEEDED FOR NAUSEA (Patient taking differently: Take 4 mg by mouth every 4 (four) hours as needed for nausea or vomiting.) 30 tablet 0   potassium chloride  (KLOR-CON  M) 10 MEQ tablet TAKE 2 TABLETS(20 MEQ) BY MOUTH TWICE DAILY 120 tablet 5   prochlorperazine  (COMPAZINE ) 10 MG tablet TAKE 1 TABLET BY MOUTH EVERY 6 HOURS AS NEEDED FOR NAUSEA (Patient taking differently: Take 10 mg by mouth every 6 (six) hours as needed for nausea or vomiting.) 90 tablet 1   rosuvastatin  (CRESTOR ) 20 MG tablet TAKE 1 TABLET(20 MG) BY MOUTH DAILY 90 tablet 3   senna-docusate (SENOKOT-S) 8.6-50 MG tablet Take 2 tablets by mouth daily.     topiramate  (TOPAMAX ) 50 MG tablet Take 1 tablet (50 mg total) by mouth daily. 90 tablet 3   UNABLE TO FIND Inject 1 each as directed See admin instructions. Allergy injections for cats, grass, and dust.     valsartan -hydrochlorothiazide (DIOVAN -HCT) 320-25 MG tablet Take 1 tablet by mouth daily.  0   Vilazodone  HCl (VIIBRYD ) 40 MG TABS Take 1 tablet (40 mg total) by mouth daily. TAKE 1 TABLET(40 MG) BY MOUTH DAILY 90 tablet 3   WIXELA INHUB 250-50 MCG/ACT AEPB INHALE 1 PUFF BY MOUTH TWICE DAILY FOR COUGH OR WHEEZE. RINSE MOUTH AFTER USE (Patient taking differently: Inhale 1 puff into the lungs in the morning and at bedtime.) 60 each 1   gabapentin  (NEURONTIN ) 300 MG capsule Take 1 capsule (300 mg total) by mouth 2 (two) times daily. (Patient taking differently: Take 300 mg by mouth at bedtime.) 60 capsule 3   No current facility-administered medications for this visit.   Allergies  Allergen Reactions   Effexor  [Venlafaxine ] Palpitations   Nsaids Other (See Comments)    Pt reports kidney disease  Pt reports kidney disease, Pt reports kidney disease     Review of  Systems: All systems reviewed and negative except where noted in HPI.    No results found.  Physical Exam: BP 130/80   Pulse (!) 105   Ht 5' 5 (1.651 m)   Wt 229 lb (103.9 kg)   LMP 01/23/2011   BMI 38.11 kg/m  Constitutional: Pleasant,well-developed, ***female in no acute distress. HEENT: Normocephalic and atraumatic. Conjunctivae are normal. No scleral icterus. Neck supple.  Cardiovascular: Normal rate, regular rhythm.  Pulmonary/chest: Effort normal and breath sounds normal. No wheezing, rales  or rhonchi. Abdominal: Soft, nondistended, nontender. Bowel sounds active throughout. There are no masses palpable. No hepatomegaly. Extremities: no edema Lymphadenopathy: No cervical adenopathy noted. Neurological: Alert and oriented to person place and time. Skin: Skin is warm and dry. No rashes noted. Psychiatric: Normal mood and affect. Behavior is normal.  CBC    Component Value Date/Time   WBC 10.6 (H) 09/26/2023 1527   WBC 12.3 (H) 10/22/2008 0530   RBC 5.21 (H) 09/26/2023 1527   HGB 14.7 09/26/2023 1527   HGB 15.0 02/12/2022 1035   HCT 43.3 09/26/2023 1527   HCT 43.9 02/12/2022 1035   PLT 336 09/26/2023 1527   PLT 329 02/12/2022 1035   MCV 83.1 09/26/2023 1527   MCV 85 02/12/2022 1035   MCV 87 11/07/2020 0000   MCH 28.2 09/26/2023 1527   MCHC 33.9 09/26/2023 1527   RDW 13.7 09/26/2023 1527   RDW 13.1 02/12/2022 1035   LYMPHSABS 2.3 09/26/2023 1527   LYMPHSABS 1.6 02/12/2022 1035   MONOABS 0.6 09/26/2023 1527   EOSABS 0.4 09/26/2023 1527   EOSABS 0.2 02/12/2022 1035   BASOSABS 0.1 09/26/2023 1527   BASOSABS 0.1 02/12/2022 1035    CMP     Component Value Date/Time   NA 138 09/26/2023 1527   NA 137 02/12/2022 1035   K 3.8 09/26/2023 1527   CL 102 09/26/2023 1527   CO2 24 09/26/2023 1527   GLUCOSE 118 (H) 09/26/2023 1527   BUN 24 (H) 09/26/2023 1527   BUN 16 02/12/2022 1035   CREATININE 1.49 (H) 09/26/2023 1527   CALCIUM  9.8 09/26/2023 1527   PROT 7.7  09/26/2023 1527   ALBUMIN 4.4 09/26/2023 1527   AST 18 09/26/2023 1527   ALT 15 09/26/2023 1527   ALKPHOS 112 09/26/2023 1527   BILITOT 0.3 09/26/2023 1527   GFRNONAA 41 (L) 09/26/2023 1527   GFRAA  10/20/2008 2105    >60        The eGFR has been calculated using the MDRD equation. This calculation has not been validated in all clinical situations. eGFR's persistently <60 mL/min signify possible Chronic Kidney Disease.       Latest Ref Rng & Units 09/26/2023    3:27 PM 03/28/2023    3:54 PM 05/27/2022    2:07 PM  CBC EXTENDED  WBC 4.0 - 10.5 K/uL 10.6  9.3  10.1   RBC 3.87 - 5.11 MIL/uL 5.21  5.39  4.85   Hemoglobin 12.0 - 15.0 g/dL 85.2  85.1  86.2   HCT 36.0 - 46.0 % 43.3  45.2  41.4   Platelets 150 - 400 K/uL 336  349  347   NEUT# 1.7 - 7.7 K/uL 7.1  5.7  7.1   Lymph# 0.7 - 4.0 K/uL 2.3  2.5  1.9       ASSESSMENT AND PLAN:  Trinidad Hun, MD

## 2024-01-21 NOTE — Patient Instructions (Addendum)
 You have a follow up appointment on   Take metamucil daily .  Dr.Cunningham recommends regular consumption of prebiotic: Yogurt , keefer, kambucha   Avoid high sugar food/juices   Keep a food diary.   _______________________________________________________  If your blood pressure at your visit was 140/90 or greater, please contact your primary care physician to follow up on this.  _______________________________________________________  If you are age 56 or older, your body mass index should be between 23-30. Your Body mass index is 38.11 kg/m. If this is out of the aforementioned range listed, please consider follow up with your Primary Care Provider.  If you are age 43 or younger, your body mass index should be between 19-25. Your Body mass index is 38.11 kg/m. If this is out of the aformentioned range listed, please consider follow up with your Primary Care Provider.   ________________________________________________________  The [facility] providers would like to encourage you to use MYCHART to communicate with providers for non-urgent requests or questions.  Due to long hold times on the telephone, sending your provider a message by [facility] may be a faster and more efficient way to get a response.  Please allow 48 business hours for a response.  Please remember that this is for non-urgent requests.  _______________________________________________________   It was a pleasure to see you today!  Thank you for trusting me with your gastrointestinal care!

## 2024-01-26 ENCOUNTER — Ambulatory Visit: Admitting: Psychiatry

## 2024-01-27 ENCOUNTER — Ambulatory Visit (INDEPENDENT_AMBULATORY_CARE_PROVIDER_SITE_OTHER): Admitting: Psychiatry

## 2024-01-27 DIAGNOSIS — F411 Generalized anxiety disorder: Secondary | ICD-10-CM

## 2024-01-27 NOTE — Progress Notes (Signed)
 [facility] Counselor/Therapist Progress Note  Patient ID: Laurie Simmons, MRN: [id number],    Date: 01/27/2024  Time Spent: 53 minutes   Treatment Type: Individual Therapy  Reported Symptoms:  anxiety, decreased depression, tendency to see the negatives more, less calm, more agitated   Mental Status Exam:  Appearance:   Casual     Behavior:  Appropriate, Sharing, and Motivated  Motor:  Normal  Speech/Language:   Clear and Coherent  Affect:  Depressed and anxious  Mood:  anxious, depressed, and irritable  Thought process:  goal directed  Thought content:    WNL  Sensory/Perceptual disturbances:    WNL  Orientation:  oriented to person, place, time/date, situation, day of week, month of year, year, and stated date of January 27, 2024.  Attention:  Fair  Concentration:  Fair  Memory:  WNL  Fund of knowledge:   Good  Insight:    Good  Judgment:   Good  Impulse Control:  Good   Risk Assessment: Danger to Self:  No Self-injurious Behavior: No Danger to Others: No Duty to Warn:no Physical Aggression / Violence:No  Access to Firearms a concern: No  Gang Involvement:No   Subjective:   Patient today in session and working further on her anxiety mostly regarding her mother recently diagnosed with Alzheimer's, dealing with situations with her 56 yr old daughter, and stressors with her high school son. Today needed talk about her concerns for mom with this latest diagnosis confirmed by mom's PCP and may see a neurologist.  Difficulty with son in certain areas including finances and setting limits. States she is clear of cancer and is taking a medication to help protect her from cancer coming back. Stated daughter did not finish her schooling. Working today on her anxiety, frustration, some depression although decreased, some sadness especially re: mother's declining memory issues, overthinking and family stressors. Still overthink a lot especially about my mother. Trying to be more  aware of her overthinking and how to interrupt it. Working on her own self esteem. Try to think things through more and not over-worry as discussed further in session today.  Interventions: Cognitive Behavioral Therapy, Solution-Oriented/Positive Psychology, and Ego-Supportive Long-term goal: Develop healthy cognitive patterns and beliefs about self and the world that lead to alleviation and help prevent the relapse of depressive symptoms Short-term goal: Replace negative self-defeating self-talk with verbalization of realistic and positive cognitive messages. Strategies: Reinforce positive, reality-based cognitive messages that enhance self-confidence and increase adaptive action.   Diagnosis:   ICD-10-CM   1. Generalized anxiety disorder  F41.1      Plan:   Patient today in session talking openly and some improved motivation as she focused further on her anxiety, depression, parenting issues, frustrations, and concern for her aging mother who is already having some memory issues.  Some heightened stress with her mothers being diagnosed with Alzheimer's and she was able to talk through a lot of her concerns in session today. Encouraged patient to begin continuing to work on her positive and self affirming behaviors as noted in sessions including: Remain in the present and focused on what she can change her control, stay in touch with people who are supportive of her, refrain from assuming that things are always going to go in a negative direction, get outside some each day, healthy nutrition and exercise, positive and encouraging self talk, challenge and counteract her self-doubt, reduce her overthinking and over analyzing, and recognize the strength she shows when working with  goal-directed behaviors to move in a direction that supports her overall improved health and wellbeing.  Laurie Simmons has made progress and definitely needs to continue working with her goal-directed behaviors as she tries to  move forward in a more hopeful and healthier direction.  Goal review and progress/challenges noted with patient.  Next appointment within 3 to 4 weeks.   Barnie Bunde, LCSW

## 2024-02-05 ENCOUNTER — Ambulatory Visit (INDEPENDENT_AMBULATORY_CARE_PROVIDER_SITE_OTHER): Payer: Self-pay | Admitting: *Deleted

## 2024-02-05 DIAGNOSIS — J309 Allergic rhinitis, unspecified: Secondary | ICD-10-CM

## 2024-02-12 ENCOUNTER — Ambulatory Visit (INDEPENDENT_AMBULATORY_CARE_PROVIDER_SITE_OTHER): Payer: Self-pay | Admitting: *Deleted

## 2024-02-12 DIAGNOSIS — J309 Allergic rhinitis, unspecified: Secondary | ICD-10-CM | POA: Diagnosis not present

## 2024-02-19 ENCOUNTER — Ambulatory Visit (INDEPENDENT_AMBULATORY_CARE_PROVIDER_SITE_OTHER): Payer: Self-pay | Admitting: *Deleted

## 2024-02-19 DIAGNOSIS — J309 Allergic rhinitis, unspecified: Secondary | ICD-10-CM

## 2024-02-20 ENCOUNTER — Encounter: Payer: Self-pay | Admitting: Adult Health

## 2024-02-20 ENCOUNTER — Telehealth: Admitting: Adult Health

## 2024-02-20 DIAGNOSIS — G4700 Insomnia, unspecified: Secondary | ICD-10-CM | POA: Diagnosis not present

## 2024-02-20 DIAGNOSIS — F331 Major depressive disorder, recurrent, moderate: Secondary | ICD-10-CM

## 2024-02-20 DIAGNOSIS — F419 Anxiety disorder, unspecified: Secondary | ICD-10-CM | POA: Diagnosis not present

## 2024-02-20 DIAGNOSIS — F39 Unspecified mood [affective] disorder: Secondary | ICD-10-CM

## 2024-02-20 DIAGNOSIS — F411 Generalized anxiety disorder: Secondary | ICD-10-CM

## 2024-02-20 DIAGNOSIS — F410 Panic disorder [episodic paroxysmal anxiety] without agoraphobia: Secondary | ICD-10-CM

## 2024-02-20 DIAGNOSIS — F32A Depression, unspecified: Secondary | ICD-10-CM

## 2024-02-20 MED ORDER — VILAZODONE HCL 40 MG PO TABS
40.0000 mg | ORAL_TABLET | Freq: Every day | ORAL | 3 refills | Status: AC
Start: 2024-02-20 — End: ?

## 2024-02-20 MED ORDER — TOPIRAMATE 50 MG PO TABS: 50.0000 mg | ORAL_TABLET | Freq: Every day | ORAL | 3 refills | Status: AC

## 2024-02-20 MED ORDER — CLORAZEPATE DIPOTASSIUM 3.75 MG PO TABS: ORAL_TABLET | ORAL | 2 refills | Status: DC

## 2024-02-20 MED ORDER — CARIPRAZINE HCL 1.5 MG PO CAPS: 1.5000 mg | ORAL_CAPSULE | Freq: Every day | ORAL | 5 refills | Status: DC

## 2024-02-20 NOTE — Progress Notes (Signed)
 Laurie Simmons [id number] [birth year] 58 y.o.  Virtual Visit via Video Note  I connected with pt @ on 02/20/24 at  9:30 AM EDT by a video enabled telemedicine application and verified that I am speaking with the correct person using two identifiers.   I discussed the limitations of evaluation and management by telemedicine and the availability of in person appointments. The patient expressed understanding and agreed to proceed.  I discussed the assessment and treatment plan with the patient. The patient was provided an opportunity to ask questions and all were answered. The patient agreed with the plan and demonstrated an understanding of the instructions.   The patient was advised to call back or seek an in-person evaluation if the symptoms worsen or if the condition fails to improve as anticipated.  I provided 25 minutes of non-face-to-face time during this encounter.  The patient was located at home.  The provider was located at [facility].   Angeline LOISE Sayers, NP   Subjective:   Patient ID:  Laurie Simmons is a 58 y.o. (DOB 1965/09/13) female.  Chief Complaint: No chief complaint on file.   HPI Marga Gramajo Cadotte presents for follow-up of anxiety, panic attacks, depression, insomnia.  Describes mood today as "ok". Pleasant. Denies tearfulness. Mood symptoms - reports some depressive symptoms. Reports the interest to do things, but lacks motivation. Reports she feels like the Anastrozole  is making her feel tired all the time. Denies anxiety - not really. Reports mother with recent Alzheimer's disease and feels anxious about that.   Denies irritability. Denies panic attacks - but has mini attacks. Reports feeling overwhelmed. Reports worry, rumination and over thinking. Reports increased situational stressors. Reports mood is stable. Stating I feel like my mood is pretty good. Feels like current medications are helpful. Taking medications as prescribed.  Energy levels lower. Active,  does not have a regular exercise routine.  Enjoys some usual interests and activities. Married. Lives with husband and 2 children. Appetite adequate. Reports weight gain - GI issues. Sleeping well most nights. Averages 7 to 9 hours. Reports some daytime napping. Reports focus and concentration difficulties - not the best. Completing tasks. Managing aspects of household. Retired. Denies AH or VH. Denies SI or HI. Denies self-harm. Denies substance use.  Therapist - Marval Bunde.  Previous medications: Abilify   Review of Systems:  Review of Systems  Musculoskeletal:  Negative for gait problem.  Neurological:  Negative for tremors.  Psychiatric/Behavioral:         Please refer to HPI    Medications: I have reviewed the patient's current medications.  Current Outpatient Medications  Medication Sig Dispense Refill   AIRSUPRA  90-80 MCG/ACT AERO Inhale 2 puffs into the lungs as needed (every 4 to 6 hours for cough, wheeze, shortness of breath.  Rinse, mouth after use). 10.7 g 1   anastrozole  (ARIMIDEX ) 1 MG tablet TAKE 1 TABLET(1 MG) BY MOUTH DAILY 90 tablet 3   aspirin  EC 81 MG tablet Take 1 tablet (81 mg total) by mouth daily. Swallow whole. 90 tablet 3   Biotin 89999 MCG TBDP Take 10,000 mcg by mouth daily.     calcium  carbonate (TUMS EX) 750 MG chewable tablet Chew 1,500 mg by mouth daily as needed for heartburn.     cariprazine  (VRAYLAR ) 1.5 MG capsule Take 1 capsule (1.5 mg total) by mouth daily. 30 capsule 2   clorazepate  (TRANXENE ) 3.75 MG tablet Take one tablet daily as needed for anxiety/panic attacks. (Patient taking differently: Take 3.75  mg by mouth at bedtime as needed for anxiety. Take one tablet daily as needed for anxiety/panic attacks.) 30 tablet 2   EPINEPHrine  (EPIPEN  2-PAK) 0.3 mg/0.3 mL IJ SOAJ injection Use as directed for life-threatening allergic reaction. (Patient taking differently: Inject 0.3 mg into the muscle as needed for anaphylaxis. Use as directed for  life-threatening allergic reaction.) 1 each 3   famotidine  (PEPCID ) 40 MG tablet TAKE 1 TABLET BY MOUTH TWICE DAILY AS DIRECTED 60 tablet 5   FARXIGA 10 MG TABS tablet Take 10 mg by mouth daily.     fluticasone  (FLONASE ) 50 MCG/ACT nasal spray Place 2 sprays into both nostrils daily as needed for allergies or rhinitis.     folic acid  (FOLVITE ) 1 MG tablet TAKE 1 TABLET BY MOUTH DAILY 30 tablet 5   loratadine  (CLARITIN ) 10 MG tablet TAKE 1 TABLET BY MOUTH ONCE DAILY AS NEEDED 90 tablet 1   MOUNJARO 7.5 MG/0.5ML Pen Inject 7.5 mg into the skin once a week.     ondansetron  (ZOFRAN ) 4 MG tablet TAKE 1 TABLET BY MOUTH EVERY 4 HOURS AS NEEDED FOR NAUSEA (Patient taking differently: Take 4 mg by mouth every 4 (four) hours as needed for nausea or vomiting.) 30 tablet 0   potassium chloride  (KLOR-CON  M) 10 MEQ tablet TAKE 2 TABLETS(20 MEQ) BY MOUTH TWICE DAILY 120 tablet 5   prochlorperazine  (COMPAZINE ) 10 MG tablet TAKE 1 TABLET BY MOUTH EVERY 6 HOURS AS NEEDED FOR NAUSEA (Patient taking differently: Take 10 mg by mouth every 6 (six) hours as needed for nausea or vomiting.) 90 tablet 1   rosuvastatin  (CRESTOR ) 20 MG tablet TAKE 1 TABLET(20 MG) BY MOUTH DAILY 90 tablet 3   senna-docusate (SENOKOT-S) 8.6-50 MG tablet Take 2 tablets by mouth daily.     topiramate  (TOPAMAX ) 50 MG tablet Take 1 tablet (50 mg total) by mouth daily. 90 tablet 3   UNABLE TO FIND Inject 1 each as directed See admin instructions. Allergy injections for cats, grass, and dust.     valsartan -hydrochlorothiazide (DIOVAN -HCT) 320-25 MG tablet Take 1 tablet by mouth daily.  0   Vilazodone  HCl (VIIBRYD ) 40 MG TABS Take 1 tablet (40 mg total) by mouth daily. TAKE 1 TABLET(40 MG) BY MOUTH DAILY 90 tablet 3   WIXELA INHUB 250-50 MCG/ACT AEPB INHALE 1 PUFF BY MOUTH TWICE DAILY FOR COUGH OR WHEEZE. RINSE MOUTH AFTER USE (Patient taking differently: Inhale 1 puff into the lungs in the morning and at bedtime.) 60 each 1   No current  facility-administered medications for this visit.    Medication Side Effects: None  Allergies:  Allergies  Allergen Reactions   Effexor  [Venlafaxine ] Palpitations   Nsaids Other (See Comments)    Pt reports kidney disease  Pt reports kidney disease, Pt reports kidney disease    Past Medical History:  Diagnosis Date   Abnormal stress test: Anterior wall ischemia cardiac cath after that showing 25% RCA no LAD disease 02/12/2022   Acquired absence of left breast 08/18/2020   Allergic rhinitis 03/10/2015   Anxiety    Anxiety and depression 08/30/2021   Arthritis    Asthma    Bilateral carpal tunnel syndrome 08/25/2018   BMI 40.0-44.9, adult (HCC) 08/23/2019   Breast cancer (HCC) 2021   Carpal tunnel syndrome    Carpal tunnel syndrome    bilateral   Chronic kidney disease    stage 3   Depression    Diabetes (HCC)    Dyslipidemia 03/27/2022   Essential hypertension  02/12/2022   Fatigue 08/30/2021   Fibroid 05/04/2019   Fibroids, intramural 08/09/2019   GERD (gastroesophageal reflux disease)    Hemodynamic instability 03/10/2015   Hypertension    Idiopathic urticaria 03/10/2015   Iron deficiency anemia due to chronic blood loss 08/09/2019   Irritable bowel syndrome    Malignant neoplasm of female breast (HCC) 04/17/2022   Moderate persistent asthma 03/10/2015   Obesity    Pain in both hands 07/16/2018   PMB (postmenopausal bleeding) 04/17/2022   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   Severe anemia 12/14/2015   Type 2 diabetes mellitus without complication, without long-term current use of insulin (HCC) 02/12/2022    Family History  Problem Relation Age of Onset   Kidney disease Mother    Pulmonary fibrosis Father    Cancer Maternal Grandmother        carcinoma of the vulva and possible ovarian cancer   Prostate cancer Maternal Grandfather    Lung cancer Paternal Grandfather        heavy smoker    Social History    Socioeconomic History   Marital status: Married    Spouse name: Phil   Number of children: 2   Years of education: Not on file   Highest education level: Not on file  Occupational History   Not on file  Tobacco Use   Smoking status: Never   Smokeless tobacco: Never  Vaping Use   Vaping status: Never Used  Substance and Sexual Activity   Alcohol use: No   Drug use: No   Sexual activity: Yes  Other Topics Concern   Not on file  Social History Narrative   Not on file   Social Drivers of Health   Financial Resource Strain: Not on file  Food Insecurity: Not on file  Transportation Needs: Not on file  Physical Activity: Not on file  Stress: Not on file  Social Connections: Not on file  Intimate Partner Violence: Not on file    Past Medical History, Surgical history, Social history, and Family history were reviewed and updated as appropriate.   Please see review of systems for further details on the patient's review from today.   Objective:   Physical Exam:  LMP 01/23/2011   Physical Exam Constitutional:      General: She is not in acute distress. Musculoskeletal:        General: No deformity.  Neurological:     Mental Status: She is alert and oriented to person, place, and time.     Coordination: Coordination normal.  Psychiatric:        Attention and Perception: Attention and perception normal. She does not perceive auditory or visual hallucinations.        Mood and Affect: Mood normal. Mood is not anxious or depressed. Affect is not labile, blunt, angry or inappropriate.        Speech: Speech normal.        Behavior: Behavior normal.        Thought Content: Thought content normal. Thought content is not paranoid or delusional. Thought content does not include homicidal or suicidal ideation. Thought content does not include homicidal or suicidal plan.        Cognition and Memory: Cognition and memory normal.        Judgment: Judgment normal.     Comments:  Insight intact     Lab Review:     Component Value Date/Time   NA 138 09/26/2023 1527  NA 137 02/12/2022 1035   K 3.8 09/26/2023 1527   CL 102 09/26/2023 1527   CO2 24 09/26/2023 1527   GLUCOSE 118 (H) 09/26/2023 1527   BUN 24 (H) 09/26/2023 1527   BUN 16 02/12/2022 1035   CREATININE 1.49 (H) 09/26/2023 1527   CALCIUM  9.8 09/26/2023 1527   PROT 7.7 09/26/2023 1527   ALBUMIN 4.4 09/26/2023 1527   AST 18 09/26/2023 1527   ALT 15 09/26/2023 1527   ALKPHOS 112 09/26/2023 1527   BILITOT 0.3 09/26/2023 1527   GFRNONAA 41 (L) 09/26/2023 1527   GFRAA  10/20/2008 2105    >60        The eGFR has been calculated using the MDRD equation. This calculation has not been validated in all clinical situations. eGFR's persistently <60 mL/min signify possible Chronic Kidney Disease.       Component Value Date/Time   WBC 10.6 (H) 09/26/2023 1527   WBC 12.3 (H) 10/22/2008 0530   RBC 5.21 (H) 09/26/2023 1527   HGB 14.7 09/26/2023 1527   HGB 15.0 02/12/2022 1035   HCT 43.3 09/26/2023 1527   HCT 43.9 02/12/2022 1035   PLT 336 09/26/2023 1527   PLT 329 02/12/2022 1035   MCV 83.1 09/26/2023 1527   MCV 85 02/12/2022 1035   MCV 87 11/07/2020 0000   MCH 28.2 09/26/2023 1527   MCHC 33.9 09/26/2023 1527   RDW 13.7 09/26/2023 1527   RDW 13.1 02/12/2022 1035   LYMPHSABS 2.3 09/26/2023 1527   LYMPHSABS 1.6 02/12/2022 1035   MONOABS 0.6 09/26/2023 1527   EOSABS 0.4 09/26/2023 1527   EOSABS 0.2 02/12/2022 1035   BASOSABS 0.1 09/26/2023 1527   BASOSABS 0.1 02/12/2022 1035    No results found for: POCLITH, LITHIUM   No results found for: PHENYTOIN, PHENOBARB, VALPROATE, CBMZ   .res Assessment: Plan:    Plan:  1. Viibryd  40mg  daily 2. Tranxene  3.75mg  once daily 3. Topamax  50 mg daily 4. Vraylar  1.5mg  daily  RTC 3 months  25 minutes spent dedicated to the care of this patient on the date of this encounter to include pre-visit review of records, ordering of  medication, post visit documentation, and face-to-face time with the patient anxiety, panic attacks, depression, insomnia. Discussed continuing current medication regimen. Plans to speak with PCP regarding fatigue.  Patient advised to contact office with any questions, adverse effects, or acute worsening in signs and symptoms.  Discussed potential benefits, risk, and side effects of benzodiazepines to include potential risk of tolerance and dependence, as well as possible drowsiness. Advised patient not to drive if experiencing drowsiness and to take lowest possible effective dose to minimize risk of dependence and tolerance.  Discussed potential metabolic side effects associated with atypical antipsychotics, as well as potential risk for movement side effects. Advised pt to contact office if movement side effects occur.    There are no diagnoses linked to this encounter.   Please see After Visit Summary for patient specific instructions.  Future Appointments  Date Time Provider Department Center  02/20/2024  9:30 AM Bostyn Kunkler, Angeline Mattocks, NP [facility] None  03/01/2024 10:00 AM Sherlynn Sober, LCSW [facility] None  03/17/2024 10:00 AM Debby Fidela CROME, NP [facility] CPR  03/30/2024 10:00 AM Sherlynn Sober, LCSW [facility] None  04/08/2024  1:30 PM Stacia Glendia BRAVO, MD [facility]  06/24/2024 10:00 AM Maurilio Camellia PARAS, MD [facility] None  10/08/2024  3:45 PM [facility] None  10/08/2024  4:30 PM Cornelius Wanda DEL, MD [facility] None  No orders of the defined types were placed in this encounter.     -------------------------------

## 2024-03-01 ENCOUNTER — Ambulatory Visit (INDEPENDENT_AMBULATORY_CARE_PROVIDER_SITE_OTHER): Admitting: Psychiatry

## 2024-03-01 DIAGNOSIS — F411 Generalized anxiety disorder: Secondary | ICD-10-CM | POA: Diagnosis not present

## 2024-03-01 NOTE — Progress Notes (Signed)
 [facility] Counselor/Therapist Progress Note  Patient ID: Laurie Simmons, MRN: [id number],    Date: 03/01/2024  Time Spent: 55 minutes   Treatment Type: Individual Therapy  Reported Symptoms: anxiety, some depression, some improvement in not looking more for the negatives, some agitated    Mental Status Exam:  Appearance:   Casual and Neat     Behavior:  Appropriate, Sharing, and Motivated  Motor:  Normal  Speech/Language:   Clear and Coherent  Affect:  Depressed and anxiety  Mood:  anxious and depressed  Thought process:  goal directed  Thought content:    Rumination  Sensory/Perceptual disturbances:    WNL  Orientation:  oriented to person, place, time/date, situation, day of week, month of year, year, and stated date of Aug. 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 Also worried that disability will be checking back on her and fears if they would take away my benefits.  States she is more Anxious than depressed recently. Patient trying to help calm elderly mom and nervous about mom who may need to eventually enter a nursing home. Difficulty setting limits with high school son and adult daughter (soon to be 70). Daughter recently responded to patient in a way that was hurtful patient . Working further on her anxiety, frustration, some depression and sadness re: mother's gradual decline mentally and some emotionally. Reports some  overthinking about family concerns, but trying to catch her overthinking and interrupt it. Encouraged in one of her goals below--re: replacing negative self-defeating  self talk with verbalization of realistic and positive messages for herself.    Interventions: Cognitive Behavioral Therapy, Solution-Oriented/Positive Psychology, and Ego-Supportive Long-term goal: Develop healthy cognitive patterns and beliefs about self and the world that lead to alleviation and help prevent the relapse of depressive symptoms Short-term goal: Replace negative self-defeating self-talk with verbalization of realistic and positive cognitive messages. Strategies: Reinforce positive, reality-based cognitive messages that enhance self-confidence and increase adaptive action.  Diagnosis:   ICD-10-CM   1. Generalized anxiety disorder  F41.1      Plan: Patient today in session expressing her challenges with anxiety and depression, which affects her motivation, family relationships at times, parenting issues, and her relationship with her elderly mother who was diagnosed with Alzheimer's in May of 2025. Focused further today her anxiety, fears, apprehension, and family related issues. Seemed more motivated after talking at length in session today. Working further to accept and help her mother diagnosed with Alzheimers.  Encouraged patient in her continued work on positive and self affirming behaviors as discussed in sessions including: Stay on the present and focused on what she can change or control, stay in touch with people who are supportive of her, refrain from assuming things are always going to be in a negative direction, get outside some daily, healthy nutrition and exercise, positive and encouraging self talk,  challenge and counteract her self-doubt, reduce her overthinking and over analyzing, and realize the strength she shows when working with goal-directed behaviors to move in a direction that supports her overall  improved health and wellbeing.  Laurie Simmons has made progress and definitely needs to continue her work with goal-directed behaviors as she tries to move forward in a more hopeful and healthier direction into the future.  Will review and progress/challenges noted with patient.  Next appointment within 3 to 4 weeks.   Barnie Bunde, LCSW

## 2024-03-05 DIAGNOSIS — Z1231 Encounter for screening mammogram for malignant neoplasm of breast: Secondary | ICD-10-CM | POA: Diagnosis not present

## 2024-03-05 DIAGNOSIS — Z9012 Acquired absence of left breast and nipple: Secondary | ICD-10-CM | POA: Diagnosis not present

## 2024-03-16 ENCOUNTER — Ambulatory Visit (INDEPENDENT_AMBULATORY_CARE_PROVIDER_SITE_OTHER): Payer: Self-pay | Admitting: *Deleted

## 2024-03-16 DIAGNOSIS — J309 Allergic rhinitis, unspecified: Secondary | ICD-10-CM

## 2024-03-16 DIAGNOSIS — C50912 Malignant neoplasm of unspecified site of left female breast: Secondary | ICD-10-CM | POA: Diagnosis not present

## 2024-03-16 DIAGNOSIS — Z170 Estrogen receptor positive status [ER+]: Secondary | ICD-10-CM | POA: Diagnosis not present

## 2024-03-16 DIAGNOSIS — C773 Secondary and unspecified malignant neoplasm of axilla and upper limb lymph nodes: Secondary | ICD-10-CM | POA: Diagnosis not present

## 2024-03-16 NOTE — Progress Notes (Signed)
 Subjective:    Patient ID: Laurie Simmons, female    DOB: [birth year], 58 y.o.   MRN: [id number]  HPI: Laurie Simmons is a 58 y.o. female who returns for follow up appointment for chronic pain and medication refill. She states her pain is located in her lower back and bilateral hips. She  rates her pain 3.  She describes her pain as deep pain, Dr Emeline note was reviewed reagrding X-ray.  Her current exercise regime is walking short distances she was encouraged to increase her HEP, she is doing pohysical therapy twice a week.      Pain Inventory Average Pain 4 Pain Right Now 3 My pain is constant and aching  In the last 24 hours, has pain interfered with the following? General activity 5 Relation with others 4 Enjoyment of life 7 What TIME of day is your pain at its worst? morning  and daytime Sleep (in general) Fair  Pain is worse with: walking and standing Pain improves with: rest Relief from Meds: No medication for the pain because it does not work.  Family History  Problem Relation Age of Onset   Kidney disease Mother    Pulmonary fibrosis Father    Cancer Maternal Grandmother        carcinoma of the vulva and possible ovarian cancer   Prostate cancer Maternal Grandfather    Lung cancer Paternal Grandfather        heavy smoker   Social History   Socioeconomic History   Marital status: Married    Spouse name: Phil   Number of children: 2   Years of education: Not on file   Highest education level: Not on file  Occupational History   Not on file  Tobacco Use   Smoking status: Never   Smokeless tobacco: Never  Vaping Use   Vaping status: Never Used  Substance and Sexual Activity   Alcohol use: No   Drug use: No   Sexual activity: Yes  Other Topics Concern   Not on file  Social History Narrative   Not on file   Social Drivers of Health   Financial Resource Strain: Not on file  Food Insecurity: Low Risk  (03/16/2024)   Received from [facility]   Hunger Vital  Sign    Within the past 12 months, you worried that your food would run out before you got money to buy more: Never true    Within the past 12 months, the food you bought just didn't last and you didn't have money to get more. : Never true  Transportation Needs: No Transportation Needs (03/16/2024)   Received from [facility]    In the past 12 months, has lack of reliable transportation kept you from medical appointments, meetings, work or from getting things needed for daily living? : No  Physical Activity: Not on file  Stress: Not on file  Social Connections: Not on file   Past Surgical History:  Procedure Laterality Date   caesarean     CESAREAN SECTION     CHOLECYSTECTOMY     DILATION AND CURETTAGE OF UTERUS     HIP SURGERY Left    LEFT HEART CATH AND CORONARY ANGIOGRAPHY N/A 02/13/2022   Procedure: LEFT HEART CATH AND CORONARY ANGIOGRAPHY;  Surgeon: Dann Candyce RAMAN, MD;  Location: [facility] INVASIVE CV LAB;  Service: Cardiovascular;  Laterality: N/A;   MASTECTOMY     PORT A CATH INJECTION ([facility] HX)  2021  Past Surgical History:  Procedure Laterality Date   caesarean     CESAREAN SECTION     CHOLECYSTECTOMY     DILATION AND CURETTAGE OF UTERUS     HIP SURGERY Left    LEFT HEART CATH AND CORONARY ANGIOGRAPHY N/A 02/13/2022   Procedure: LEFT HEART CATH AND CORONARY ANGIOGRAPHY;  Surgeon: Dann Candyce RAMAN, MD;  Location: [facility] INVASIVE CV LAB;  Service: Cardiovascular;  Laterality: N/A;   MASTECTOMY     PORT A CATH INJECTION ([facility] HX)  2021   Past Medical History:  Diagnosis Date   Abnormal stress test: Anterior wall ischemia cardiac cath after that showing 25% RCA no LAD disease 02/12/2022   Acquired absence of left breast 08/18/2020   Allergic rhinitis 03/10/2015   Anxiety    Anxiety and depression 08/30/2021   Arthritis    Asthma    Bilateral carpal tunnel syndrome 08/25/2018   BMI 40.0-44.9, adult (HCC) 08/23/2019   Breast cancer (HCC) 2021    Carpal tunnel syndrome    Carpal tunnel syndrome    bilateral   Chronic kidney disease    stage 3   Depression    Diabetes (HCC)    Dyslipidemia 03/27/2022   Essential hypertension 02/12/2022   Fatigue 08/30/2021   Fibroid 05/04/2019   Fibroids, intramural 08/09/2019   GERD (gastroesophageal reflux disease)    Hemodynamic instability 03/10/2015   Hypertension    Idiopathic urticaria 03/10/2015   Iron deficiency anemia due to chronic blood loss 08/09/2019   Irritable bowel syndrome    Malignant neoplasm of female breast (HCC) 04/17/2022   Moderate persistent asthma 03/10/2015   Obesity    Pain in both hands 07/16/2018   PMB (postmenopausal bleeding) 04/17/2022   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   Severe anemia 12/14/2015   Type 2 diabetes mellitus without complication, without long-term current use of insulin (HCC) 02/12/2022   LMP 01/23/2011   Opioid Risk Score:   Fall Risk Score:  `1  Depression screen [facility] 2/9     05/02/2023   12:46 PM 03/21/2023   10:13 AM 02/19/2023   11:26 AM 01/15/2023   10:40 AM  Depression screen PHQ 2/9  Decreased Interest 0 0 0 1  Down, Depressed, Hopeless 0 0 0 1  PHQ - 2 Score 0 0 0 2  Altered sleeping    1  Tired, decreased energy    1  Change in appetite    0  Feeling bad or failure about yourself     0  Trouble concentrating    1  Moving slowly or fidgety/restless    0  Suicidal thoughts    0  PHQ-9 Score    5    Review of Systems  Musculoskeletal:  Positive for back pain.  All other systems reviewed and are negative.      Objective:   Physical Exam Vitals and nursing note reviewed.  Constitutional:      Appearance: Normal appearance.  Cardiovascular:     Rate and Rhythm: Normal rate and regular rhythm.     Pulses: Normal pulses.     Heart sounds: Normal heart sounds.  Pulmonary:     Effort: Pulmonary effort is normal.     Breath sounds: Normal breath sounds.  Musculoskeletal:      Comments: Normal Muscle Bulk and Muscle Testing Reveals:  Upper Extremities: Full ROM and Muscle Strength  5/5  Lower Extremities: Full ROM and Muscle Strength 5/5 Arises from Table Slowly  Narrow Based  Gait     Skin:    General: Skin is warm and dry.  Neurological:     Mental Status: She is alert and oriented to person, place, and time.  Psychiatric:        Mood and Affect: Mood normal.        Behavior: Behavior normal.          Assessment & Plan:  Spondylosis without Myelopathy/ Lumbar Facet Arthropathy: Continue HEP as Tolerated. Continue to Monitor. 03/16/2024 Arthritis of Left Hip: Continue HEP as Tolerated. Continue current medication regimen. Continue to Monitor. 03/16/2024 Acquired leg length discrepancy: Continue to Monitor. SABRA 03/16/2024 Chronic Pain Syndrome: Tramadol  ineffective: Oral Swab was Performed today. Continue to Monitor.   Continue F/U 2 months   with Dr Emeline

## 2024-03-17 ENCOUNTER — Encounter: Attending: Registered Nurse | Admitting: Registered Nurse

## 2024-03-17 ENCOUNTER — Encounter: Payer: Self-pay | Admitting: Registered Nurse

## 2024-03-17 VITALS — BP 125/82 | HR 82 | Ht 65.0 in | Wt 235.0 lb

## 2024-03-17 DIAGNOSIS — Z79891 Long term (current) use of opiate analgesic: Secondary | ICD-10-CM | POA: Diagnosis not present

## 2024-03-17 DIAGNOSIS — G894 Chronic pain syndrome: Secondary | ICD-10-CM | POA: Insufficient documentation

## 2024-03-17 DIAGNOSIS — M47816 Spondylosis without myelopathy or radiculopathy, lumbar region: Secondary | ICD-10-CM | POA: Diagnosis not present

## 2024-03-17 DIAGNOSIS — Z79899 Other long term (current) drug therapy: Secondary | ICD-10-CM | POA: Insufficient documentation

## 2024-03-19 LAB — DRUG TOX MONITOR 1 W/CONF, ORAL FLD
Alprazolam: NEGATIVE ng/mL (ref <0.50)
Aminoclonazepam: NEGATIVE ng/mL (ref <0.50)
Amphetamines: NEGATIVE ng/mL (ref <10)
Barbiturates: NEGATIVE ng/mL (ref <10)
Benzodiazepines: POSITIVE ng/mL — AB (ref <0.50)
Buprenorphine: NEGATIVE ng/mL (ref <0.10)
Chlordiazepoxide: NEGATIVE ng/mL (ref <0.50)
Clonazepam: NEGATIVE ng/mL (ref <0.50)
Cocaine: NEGATIVE ng/mL (ref <5.0)
Diazepam: NEGATIVE ng/mL (ref <0.50)
Fentanyl: NEGATIVE ng/mL (ref <0.10)
Flunitrazepam: NEGATIVE ng/mL (ref <0.50)
Flurazepam: NEGATIVE ng/mL (ref <0.50)
Heroin Metabolite: NEGATIVE ng/mL (ref <1.0)
Lorazepam: NEGATIVE ng/mL (ref <0.50)
MARIJUANA: NEGATIVE ng/mL (ref <2.5)
MDMA: NEGATIVE ng/mL (ref <10)
Meprobamate: NEGATIVE ng/mL (ref <2.5)
Methadone: NEGATIVE ng/mL (ref <5.0)
Midazolam: NEGATIVE ng/mL (ref <0.50)
Nicotine Metabolite: NEGATIVE ng/mL (ref <5.0)
Nordiazepam: 1.88 ng/mL — ABNORMAL HIGH (ref <0.50)
Opiates: NEGATIVE ng/mL (ref <2.5)
Oxazepam: NEGATIVE ng/mL (ref <0.50)
Phencyclidine: NEGATIVE ng/mL (ref <10)
Tapentadol: NEGATIVE ng/mL (ref <5.0)
Temazepam: NEGATIVE ng/mL (ref <0.50)
Tramadol: NEGATIVE ng/mL (ref <5.0)
Triazolam: NEGATIVE ng/mL (ref <0.50)
Zolpidem: NEGATIVE ng/mL (ref <5.0)

## 2024-03-19 LAB — DRUG TOX ALC METAB W/CON, ORAL FLD: Alcohol Metabolite: NEGATIVE ng/mL (ref <25)

## 2024-03-22 ENCOUNTER — Telehealth: Payer: Self-pay | Admitting: Registered Nurse

## 2024-03-22 NOTE — Telephone Encounter (Signed)
 Laurie Simmons  I reviewed the oral swab. She is prescribed Clorazepate , according to [employer] , it's broken down to primary active metabolite nordiazepam. Can you check into this for me when you get a chance.

## 2024-03-23 ENCOUNTER — Ambulatory Visit: Admitting: Gastroenterology

## 2024-03-23 DIAGNOSIS — G894 Chronic pain syndrome: Secondary | ICD-10-CM

## 2024-03-23 DIAGNOSIS — M47816 Spondylosis without myelopathy or radiculopathy, lumbar region: Secondary | ICD-10-CM

## 2024-03-24 ENCOUNTER — Telehealth: Payer: Self-pay | Admitting: Registered Nurse

## 2024-03-24 DIAGNOSIS — Z01419 Encounter for gynecological examination (general) (routine) without abnormal findings: Secondary | ICD-10-CM | POA: Diagnosis not present

## 2024-03-24 NOTE — Telephone Encounter (Signed)
 Dr Emeline and Dr Carilyn note was reviewed.  Ms. Laurie Simmons reports Lower back pain and Tramadol  was ineffective, last prescription was 06/2023 She had MBB on 05/02/2023, with no relief.  She is CKD: Stage 3, Dr. Emeline are you in agreement with Butrans.  I will await your response.  Call placed to [name] regarding the above, she verbalizes understanding.

## 2024-03-30 ENCOUNTER — Ambulatory Visit (INDEPENDENT_AMBULATORY_CARE_PROVIDER_SITE_OTHER): Admitting: Psychiatry

## 2024-03-30 DIAGNOSIS — F411 Generalized anxiety disorder: Secondary | ICD-10-CM | POA: Diagnosis not present

## 2024-03-30 NOTE — Progress Notes (Signed)
 [facility] Counselor/Therapist Progress Note  Patient ID: Laurie Simmons, MRN: [id number],    Date: 03/30/2024  Time Spent: 53 minutes   Treatment Type: Individual Therapy  Reported Symptoms: anxiety, depression some, feeling overwhelmed at times, trying to not look just for the negatives, difficulty with elderly mother who has been advised not to drive by her doctor   Mental Status Exam:  Appearance:   Casual     Behavior:  Appropriate, Sharing, and Motivated  Motor:  Normal  Speech/Language:   Clear and Coherent  Affect:  Depressed and anxious  Mood:  anxious and depressed  Thought process:  goal directed  Thought content:    Rumination  Sensory/Perceptual disturbances:    WNL  Orientation:  oriented to person, place, time/date, situation, day of week, month of year, year, and stated date of Sept. 16, 2025  Attention:  Fair  Concentration:  Fair  Memory:  WNL  Fund of knowledge:   Good  Insight:    Good and Fair  Judgment:   Good  Impulse Control:  Good   Risk Assessment: Danger to Self:  No Self-injurious Behavior: No Danger to Others: No Duty to Warn:no Physical Aggression / Violence:No  Access to Firearms a concern: No  Gang Involvement:No   Subjective:   Patient today reporting continued anxiety and some depression re: my mom's health, family issues,  and her own personal life stressors. Needed session today to share and talk through these stressors mentioned, as she doesn't have many people to confide in and express her fears and anxiety. Helped patient focus on her own self-care, her feelings about her mom's alzheimers, and how to take better care of herself, and better manage stress and worrying. Mixed emotions and they sometimes pile up. Working further today on managing tough emotions. Difficulty helping manage her mom and her affairs, as well as her own sense of sadness. Trying to better manage her mother's gradual decline emotionally and mentally. Trying  to decrease her overthinking by interrupting it, and replacing negative or anxious thoughts with more positive messages.   Interventions: Cognitive Behavioral Therapy, Solution-Oriented/Positive Psychology, and Ego-Supportive Long-term goal: Develop healthy cognitive patterns and beliefs about self and the world that lead to alleviation and help prevent the relapse of depressive symptoms Short-term goal: Replace negative self-defeating self-talk with verbalization of realistic and positive cognitive messages. Strategies: Reinforce positive, reality-based cognitive messages that enhance self-confidence and increase adaptive action.   Diagnosis:   ICD-10-CM   1. Generalized anxiety disorder  F41.1      Plan: Patient today sharing her fears, anxieties, and some depression very openly and stated at end of session how much this helps her in being able to cope more effectively. Doesn't have a lot of people with whom she feels comfortable talking openly.  Seem to feel some relief in talking through session today as well as realizing some of the ways she might be helpful to her mom as this is a stressful time for the family with mom being diagnosed with Alzheimer's and the doctor already setting that on her driving.  Patient herself is also working to further accept the diagnosis, and continue to support her mom as well as continue working on her parenting issues and her own belief in herself.  Motivation seemed a little better by end of session. Reminded and encouraged patient to keep working on her positive and self affirming behaviors as discussed in sessions including: Remain in the present and focus  on what she can control or change, stay in touch with people who are supportive of her, refrain from assuming that things that are always going to go in a negative direction, get outside some daily, healthy nutrition and exercise, positive and encouraging self talk, challenge and counteract her self-doubt,  reduce her overthinking and overanalyzing, and recognize the strengths she shows when working with goal-directed behaviors to move in a direction that supports her overall improved health and wellbeing.  Augustina Renn continues to make progress gradually and definitely needs to continue her work with goal-directed behaviors as she works to move forward in a more hopeful and healthier direction for her future.  Goal review and progress/challenges noted with patient.  Next appointment within 3 to 4 weeks.   Barnie Bunde, LCSW

## 2024-04-01 ENCOUNTER — Other Ambulatory Visit: Payer: Self-pay | Admitting: Allergy and Immunology

## 2024-04-08 ENCOUNTER — Other Ambulatory Visit: Payer: Self-pay | Admitting: Adult Health

## 2024-04-08 ENCOUNTER — Ambulatory Visit: Admitting: Gastroenterology

## 2024-04-08 DIAGNOSIS — F331 Major depressive disorder, recurrent, moderate: Secondary | ICD-10-CM

## 2024-04-12 ENCOUNTER — Ambulatory Visit (INDEPENDENT_AMBULATORY_CARE_PROVIDER_SITE_OTHER)

## 2024-04-12 DIAGNOSIS — J309 Allergic rhinitis, unspecified: Secondary | ICD-10-CM | POA: Diagnosis not present

## 2024-04-13 ENCOUNTER — Other Ambulatory Visit: Payer: Self-pay | Admitting: Allergy and Immunology

## 2024-04-15 DIAGNOSIS — N1831 Chronic kidney disease, stage 3a: Secondary | ICD-10-CM | POA: Diagnosis not present

## 2024-04-15 DIAGNOSIS — I129 Hypertensive chronic kidney disease with stage 1 through stage 4 chronic kidney disease, or unspecified chronic kidney disease: Secondary | ICD-10-CM | POA: Diagnosis not present

## 2024-04-15 DIAGNOSIS — N2581 Secondary hyperparathyroidism of renal origin: Secondary | ICD-10-CM | POA: Diagnosis not present

## 2024-04-15 DIAGNOSIS — D509 Iron deficiency anemia, unspecified: Secondary | ICD-10-CM | POA: Diagnosis not present

## 2024-04-16 ENCOUNTER — Telehealth: Payer: Self-pay | Admitting: Adult Health

## 2024-04-16 MED ORDER — BUSPIRONE HCL 5 MG PO TABS: 5.0000 mg | ORAL_TABLET | Freq: Two times a day (BID) | ORAL | 0 refills | Status: DC

## 2024-04-16 MED ORDER — BUPRENORPHINE 5 MCG/HR TD PTWK: 1.0000 | MEDICATED_PATCH | TRANSDERMAL | 0 refills | Status: DC

## 2024-04-16 NOTE — Telephone Encounter (Signed)
 Please see message and advise

## 2024-04-16 NOTE — Telephone Encounter (Signed)
 Advised patient of recommendation to change to Buspar. Rx sent to requested pharmacy.

## 2024-04-16 NOTE — Telephone Encounter (Signed)
 Pt LVM @ 9:04a stating that her back doctor wants her to take Butrans patch for back pain, but then she wont' be able to take the Clorazepate  from [place].  She needs to get a different medication for her anxiety from [place].  Next appt 11/10

## 2024-04-20 DIAGNOSIS — E1169 Type 2 diabetes mellitus with other specified complication: Secondary | ICD-10-CM | POA: Diagnosis not present

## 2024-04-20 DIAGNOSIS — E785 Hyperlipidemia, unspecified: Secondary | ICD-10-CM | POA: Diagnosis not present

## 2024-04-26 ENCOUNTER — Ambulatory Visit (INDEPENDENT_AMBULATORY_CARE_PROVIDER_SITE_OTHER): Admitting: Psychiatry

## 2024-04-26 DIAGNOSIS — F411 Generalized anxiety disorder: Secondary | ICD-10-CM | POA: Diagnosis not present

## 2024-04-26 NOTE — Progress Notes (Signed)
 [facility] Counselor/Therapist Progress Note  Patient ID: ERENDIDA WRENN, MRN: [id number],    Date: 04/26/2024  Time Spent: 48 minutes   Treatment Type: Individual Therapy  Reported Symptoms: anxiety, some depression, overwhelmed at times, trying to not look just for the negatives, difficulty with elderly mom who has been advised to not drive by her doctor   Mental Status Exam:  Appearance:   Casual     Behavior:  Appropriate and Sharing  Motor:  Normal  Speech/Language:   Clear and Coherent  Affect:  Anxious, some depression  Mood:  anxious and some depression  Thought process:  goal directed  Thought content:    Rumination  Sensory/Perceptual disturbances:    WNL  Orientation:  oriented to person, place, time/date, situation, day of week, month of year, year, and stated date of Oct. 13, 2025  Attention:  Good  Concentration:  Good and Fair  Memory:  WNL  Fund of knowledge:   Good and Fair  Insight:    Good  Judgment:   Good  Impulse Control:  Good   Risk Assessment: Danger to Self:  No Self-injurious Behavior: No Danger to Others: No Duty to Warn:no Physical Aggression / Violence:No  Access to Firearms a concern: No  Gang Involvement:No   Subjective: Patient continuing her work on anxiety and depression in session today with her symptoms particularly related to family issues, patient's own personal life stressors, and her mom's health now and into the future as she has noticed some decline physically, mentally, and emotionally. Today asked to work on some strategies to be more patient with her mother when mother repeats comments and questions over and over, and gets obsessed with certain issues. Working to take better care of herself in the midst of also helping her elderly mother with physical and memory issues. Needing to vent her frustrations and concerns today and feel heard as the situation with her mother is worsening with physical issues and memory issues.  Feeling more overwhelmed with everything. Looking at ways to stop and take breaks during the day, as it's easy for her to get overwhelmed. Talking through her concerns, sadness, frustrations re: her mom. Encouraged patient in her own self-care and management of the feelings when she does get overwhelmed, taking good care of herself, better management of her worrying and stress.  Patient balancing her sadness and seeing her mom decline mentally and emotionally, along with the physical taking care of mom and staying on top of her appointments, medications.  Trying to decrease her overthinking.   Interventions: Cognitive Behavioral Therapy, Solution-Oriented/Positive Psychology, and Ego-Supportive Long-term goal: Develop healthy cognitive patterns and beliefs about self and the world that lead to alleviation and help prevent the relapse of depressive symptoms Short-term goal: Replace negative self-defeating self-talk with verbalization of realistic and positive cognitive messages. Strategies: Reinforce positive, reality-based cognitive messages that enhance self-confidence and increase adaptive action.    Diagnosis:   ICD-10-CM   1. Generalized anxiety disorder  F41.1      Plan: Patient today talking through her feelings of depression, anxiety, and some fears as she continues trying to cope with the decline of her mother physically and emotionally/mentally.  Does not have a lot of close friends with whom she talks openly.  Was very open in session today and seem to feel some relief and I did encourage her to develop some support in addition to our office, perhaps maybe friends at her church to enlarge her circle  of support.  Is working to accept her mom's diagnosis as well as continuing to support her mom however needed.  Trying to stay in touch with people who are supportive of patient, refrain from assuming that things are always going to go in a negative direction, get outside some each day, healthy  nutrition and exercise as she is able, positive and encouraging self-talk, challenge and counteract her self-doubt, reduce her overthinking, and realize the strength she shows when working with goal-directed behaviors to move in a direction that supports her overall improved wellbeing.  Cassadee Bauza has made progress very gradually and definitely needs to continue working with goal-directed behaviors to keep moving in a more hopeful and healthier direction into her future.  Goal review and progress/challenges noted with patient.  Next appointment within 3 to 4 weeks.   Barnie Bunde, LCSW

## 2024-04-27 DIAGNOSIS — Z23 Encounter for immunization: Secondary | ICD-10-CM | POA: Diagnosis not present

## 2024-04-27 DIAGNOSIS — E1169 Type 2 diabetes mellitus with other specified complication: Secondary | ICD-10-CM | POA: Diagnosis not present

## 2024-04-27 DIAGNOSIS — E785 Hyperlipidemia, unspecified: Secondary | ICD-10-CM | POA: Diagnosis not present

## 2024-04-27 DIAGNOSIS — Z6838 Body mass index (BMI) 38.0-38.9, adult: Secondary | ICD-10-CM | POA: Diagnosis not present

## 2024-04-27 DIAGNOSIS — I10 Essential (primary) hypertension: Secondary | ICD-10-CM | POA: Diagnosis not present

## 2024-04-27 DIAGNOSIS — Z1331 Encounter for screening for depression: Secondary | ICD-10-CM | POA: Diagnosis not present

## 2024-04-27 DIAGNOSIS — N1832 Chronic kidney disease, stage 3b: Secondary | ICD-10-CM | POA: Diagnosis not present

## 2024-05-03 ENCOUNTER — Other Ambulatory Visit: Payer: Self-pay | Admitting: Oncology

## 2024-05-03 DIAGNOSIS — D500 Iron deficiency anemia secondary to blood loss (chronic): Secondary | ICD-10-CM

## 2024-05-10 ENCOUNTER — Ambulatory Visit: Payer: Self-pay | Admitting: *Deleted

## 2024-05-10 DIAGNOSIS — J309 Allergic rhinitis, unspecified: Secondary | ICD-10-CM | POA: Diagnosis not present

## 2024-05-13 MED ORDER — BUPRENORPHINE 10 MCG/HR TD PTWK: 1.0000 | MEDICATED_PATCH | TRANSDERMAL | 0 refills | Status: AC

## 2024-05-13 NOTE — Addendum Note (Signed)
 Addended by: EMELINE SEARCH on: 05/13/2024 01:05 PM   Modules accepted: Orders

## 2024-05-17 ENCOUNTER — Other Ambulatory Visit: Payer: Self-pay | Admitting: Adult Health

## 2024-05-17 ENCOUNTER — Encounter: Payer: Self-pay | Admitting: Physical Medicine and Rehabilitation

## 2024-05-17 ENCOUNTER — Encounter: Attending: Registered Nurse | Admitting: Physical Medicine and Rehabilitation

## 2024-05-17 VITALS — BP 118/83 | HR 77 | Ht 65.0 in | Wt 236.6 lb

## 2024-05-17 DIAGNOSIS — F32A Depression, unspecified: Secondary | ICD-10-CM | POA: Diagnosis not present

## 2024-05-17 DIAGNOSIS — Z79891 Long term (current) use of opiate analgesic: Secondary | ICD-10-CM | POA: Insufficient documentation

## 2024-05-17 DIAGNOSIS — M545 Low back pain, unspecified: Secondary | ICD-10-CM | POA: Insufficient documentation

## 2024-05-17 DIAGNOSIS — M47816 Spondylosis without myelopathy or radiculopathy, lumbar region: Secondary | ICD-10-CM | POA: Insufficient documentation

## 2024-05-17 DIAGNOSIS — F419 Anxiety disorder, unspecified: Secondary | ICD-10-CM | POA: Insufficient documentation

## 2024-05-17 DIAGNOSIS — G894 Chronic pain syndrome: Secondary | ICD-10-CM | POA: Diagnosis not present

## 2024-05-17 DIAGNOSIS — Z79899 Other long term (current) drug therapy: Secondary | ICD-10-CM | POA: Insufficient documentation

## 2024-05-17 NOTE — Progress Notes (Signed)
 Subjective:    Patient ID: Laurie Simmons, female    DOB: 11/14/1965, 58 y.o.   MRN: [id number]  HPI   Laurie Simmons is a 58 y.o. year old female  who  has a past medical history of Abnormal stress test: Anterior wall ischemia cardiac cath after that showing 25% RCA no LAD disease (02/12/2022), Acquired absence of left breast (08/18/2020), Allergic rhinitis (03/10/2015), Anxiety, Anxiety and depression (08/30/2021), Arthritis, Asthma, Bilateral carpal tunnel syndrome (08/25/2018), BMI 40.0-44.9, adult (HCC) (08/23/2019), Breast cancer (HCC) (2021), Carpal tunnel syndrome, Carpal tunnel syndrome, Chronic kidney disease, Depression, Diabetes (HCC), Dyslipidemia (03/27/2022), Essential hypertension (02/12/2022), Fatigue (08/30/2021), Fibroid (05/04/2019), Fibroids, intramural (08/09/2019), GERD (gastroesophageal reflux disease), Hemodynamic instability (03/10/2015), Hypertension, Idiopathic urticaria (03/10/2015), Iron deficiency anemia due to chronic blood loss (08/09/2019), Irritable bowel syndrome, Malignant neoplasm of female breast (HCC) (04/17/2022), Moderate persistent asthma (03/10/2015), Obesity, Pain in both hands (07/16/2018), PMB (postmenopausal bleeding) (04/17/2022), 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   They are presenting to PM&R clinic for follow up related to chronic low back pain .   Plan from last visit: Spondylosis without Myelopathy/ Lumbar Facet Arthropathy: Continue HEP as Tolerated. Continue to Monitor. 03/16/2024 Arthritis of Left Hip: Continue HEP as Tolerated. Continue current medication regimen. Continue to Monitor. 03/16/2024 Acquired leg length discrepancy: Continue to Monitor. Laurie Simmons 03/16/2024 Chronic Pain Syndrome: Tramadol  ineffective: Oral Swab was Performed today. Continue to Monitor.    Continue F/U 2  months   with Dr Emeline    Interval Hx:  Discussed the use of AI scribe software for clinical note transcription with the patient, who gave verbal consent to proceed.  History of Present Illness   Laurie Simmons is a 58 year old female with chronic low back pain who presents for follow-up and medication management.  Chronic low back pain is localized to the lower back without radiation and has worsened since her mastectomy. The pain significantly limits her daily activities, allowing only five to ten minutes of chores before requiring rest.  Previous treatments include two courses of physical therapy, most recently in the spring, which were not beneficial. A trial of medial branch blocks at the L3, L4, and L5 dorsal rami was ineffective and reportedly worsened her pain.  Current medications include a Butrans patch, scheduled to increase from 5 mcg to 10 mcg. She has switched from chlorazepate to buspirone for anxiety, taken twice daily. She experiences drowsiness, possibly related to her medications.  There is no numbness, tingling, or bowel and bladder control issues. She tolerates MRI procedures without anxiety.     Dhe has has 2 rounds of PT in the last year, with no improvement. Does not do HEP because she was released form PT for failing to progress    Pain Inventory Average Pain 6 Pain Right Now 5 My pain is aching  In the last 24 hours, has pain interfered with the following? General activity 5 Relation with others 2 Enjoyment of life 4 What TIME of day is your pain at its worst? morning  and daytime Sleep (in general) Good  Pain is worse with: walking and standing Pain improves with: rest Relief from Meds: none  Family History  Problem Relation Age of Onset   Kidney disease Mother    Pulmonary fibrosis Father    Cancer Maternal Grandmother        carcinoma  of the vulva and possible ovarian cancer   Prostate cancer Maternal Grandfather    Lung cancer Paternal  Grandfather        heavy smoker   Social History   Socioeconomic History   Marital status: Married    Spouse name: Laurie Simmons   Number of children: 2   Years of education: Not on file   Highest education level: Not on file  Occupational History   Not on file  Tobacco Use   Smoking status: Never   Smokeless tobacco: Never  Vaping Use   Vaping status: Never Used  Substance and Sexual Activity   Alcohol use: No   Drug use: No   Sexual activity: Yes  Other Topics Concern   Not on file  Social History Narrative   Not on file   Social Drivers of Health   Financial Resource Strain: Not on file  Food Insecurity: Low Risk  (03/16/2024)   Received from [facility]   Hunger Vital Sign    Within the past 12 months, you worried that your food would run out before you got money to buy more: Never true    Within the past 12 months, the food you bought just didn't last and you didn't have money to get more. : Never true  Transportation Needs: No Transportation Needs (03/16/2024)   Received from [facility]    In the past 12 months, has lack of reliable transportation kept you from medical appointments, meetings, work or from getting things needed for daily living? : No  Physical Activity: Not on file  Stress: Not on file  Social Connections: Not on file   Past Surgical History:  Procedure Laterality Date   caesarean     CESAREAN SECTION     CHOLECYSTECTOMY     DILATION AND CURETTAGE OF UTERUS     HIP SURGERY Left    LEFT HEART CATH AND CORONARY ANGIOGRAPHY N/A 02/13/2022   Procedure: LEFT HEART CATH AND CORONARY ANGIOGRAPHY;  Surgeon: Dann Candyce RAMAN, MD;  Location: [facility] INVASIVE CV LAB;  Service: Cardiovascular;  Laterality: N/A;   MASTECTOMY     PORT A CATH INJECTION ([facility] HX)  2021   Past Surgical History:  Procedure Laterality Date   caesarean     CESAREAN SECTION     CHOLECYSTECTOMY     DILATION AND CURETTAGE OF UTERUS     HIP SURGERY Left    LEFT  HEART CATH AND CORONARY ANGIOGRAPHY N/A 02/13/2022   Procedure: LEFT HEART CATH AND CORONARY ANGIOGRAPHY;  Surgeon: Dann Candyce RAMAN, MD;  [facility] LAB;  Service: Cardiovascular;  Laterality: N/A;   MASTECTOMY     PORT A CATH INJECTION ([facility] HX)  2021   Past Medical History:  Diagnosis Date   Abnormal stress test: Anterior wall ischemia cardiac cath after that showing 25% RCA no LAD disease 02/12/2022   Acquired absence of left breast 08/18/2020   Allergic rhinitis 03/10/2015   Anxiety    Anxiety and depression 08/30/2021   Arthritis    Asthma    Bilateral carpal tunnel syndrome 08/25/2018   BMI 40.0-44.9, adult (HCC) 08/23/2019   Breast cancer (HCC) 2021   Carpal tunnel syndrome    Carpal tunnel syndrome    bilateral   Chronic kidney disease    stage 3   Depression    Diabetes (HCC)    Dyslipidemia 03/27/2022   Essential hypertension 02/12/2022   Fatigue 08/30/2021   Fibroid 05/04/2019  Fibroids, intramural 08/09/2019   GERD (gastroesophageal reflux disease)    Hemodynamic instability 03/10/2015   Hypertension    Idiopathic urticaria 03/10/2015   Iron deficiency anemia due to chronic blood loss 08/09/2019   Irritable bowel syndrome    Malignant neoplasm of female breast (HCC) 04/17/2022   Moderate persistent asthma 03/10/2015   Obesity    Pain in both hands 07/16/2018   PMB (postmenopausal bleeding) 04/17/2022   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   Severe anemia 12/14/2015   Type 2 diabetes mellitus without complication, without long-term current use of insulin (HCC) 02/12/2022   BP 118/83 (BP Location: Left Arm, Patient Position: Sitting, Cuff Size: Large)   Pulse 77   Ht 5' 5 (1.651 m)   Wt 236 lb 9.6 oz (107.3 kg)   LMP 01/23/2011   SpO2 98%   BMI 39.37 kg/m   Opioid Risk Score:   Fall Risk Score:  `1  Depression screen [facility] 2/9     03/17/2024   10:02 AM 05/02/2023   12:46 PM 03/21/2023   10:13 AM  02/19/2023   11:26 AM 01/15/2023   10:40 AM  Depression screen PHQ 2/9  Decreased Interest 0 0 0 0 1  Down, Depressed, Hopeless 0 0 0 0 1  PHQ - 2 Score 0 0 0 0 2  Altered sleeping     1  Tired, decreased energy     1  Change in appetite     0  Feeling bad or failure about yourself      0  Trouble concentrating     1  Moving slowly or fidgety/restless     0  Suicidal thoughts     0  PHQ-9 Score     5      Review of Systems  Musculoskeletal:  Positive for back pain.       Low back pain  All other systems reviewed and are negative.      Objective:   Physical Exam   Constitution: Appropriate appearance for age. No apparent distress  +Obese Resp: No respiratory distress. No accessory muscle usage. on RA   Cardio: Well perfused appearance. 1+ bilateral peripheral edema. Abdomen: Nondistended. Nontender.   Psych: Appropriate mood and affect. Neuro: AAOx4. No apparent cognitive deficits    Neurologic Exam:   DTRs: Reflexes were 1+ in bilateral achilles, patella  Sensory exam: revealed normal sensation in all dermatomal regions in  bilateral lower extremities Motor exam: strength 5/5 throughout bilateral upper extremities and bilateral lower extremities Coordination: Fine motor coordination was normal.      Back Exam:   Inspection: Pelvis was even.  Lumbar lordotic curvature was  wnl .  There was no evidence of scoliosis. +Bilateral  Genu valgum.  Palpation: + Tight, ropey paraspinals R>L + facet loading to L  - slump          Assessment & Plan:   They are presenting to PM&R clinic for follow up related to chronic low back pain .Laurie Simmons Failed tramadol  and MBB with Dr Carilyn. Does not want to consider surgery unless absolutely necessary.   Assessment and Plan    Chronic low back pain with severe lumbar spine arthropathy Chronic low back pain unresponsive to prior treatments, possibly worsened post-mastectomy.  - Order MRI of the lumbar spine. - Continue current  medication management. - Discuss potential surgical referral if MRI findings indicate necessity.  Anxiety disorder Anxiety managed with buspirone, recently switched from chlorazepate. Potential side  effects include drowsiness. - Continue buspirone. - Coordinate with Angeline Sayers, NP at [facility], regarding medication management and side effects.        - Follow up in 1 month with Fidela given recent change fm Butrans 5 to 10 mcg

## 2024-05-17 NOTE — Patient Instructions (Signed)
  VISIT SUMMARY: You came in today for a follow-up on your chronic low back pain and to manage your medications. We discussed your ongoing pain, which has worsened since your mastectomy and limits your daily activities. We also reviewed your anxiety management and the recent switch in your medication.  YOUR PLAN: -CHRONIC LOW BACK PAIN WITH SEVERE LUMBAR SPINE ARTHROPATHY: Chronic low back pain is persistent pain in the lower back, often due to issues with the spine. Your pain has not improved with previous treatments and has worsened since your mastectomy. We will order an MRI of your lumbar spine to get a better understanding of the cause. You should continue with your current medications, and we will consider a surgical referral if the MRI shows it is necessary.  -ANXIETY DISORDER: Anxiety disorder is a condition characterized by excessive worry or fear. You have been managing your anxiety with buspirone, which you recently switched to from chlorazepate. Continue taking buspirone as prescribed. We will coordinate with Regina Mozingo, NP at [facility], to manage your medication and monitor any side effects, such as drowsiness.  INSTRUCTIONS: Please schedule an MRI of your lumbar spine as soon as possible. Continue taking your current medications as prescribed. Follow up with Angeline Sayers, NP at [facility], to discuss your anxiety medication and any side effects you may be experiencing. We will review the MRI results and discuss the next steps, including the possibility of a surgical referral, if necessary.

## 2024-05-19 ENCOUNTER — Other Ambulatory Visit

## 2024-05-19 ENCOUNTER — Encounter: Payer: Self-pay | Admitting: Gastroenterology

## 2024-05-19 ENCOUNTER — Ambulatory Visit: Admitting: Gastroenterology

## 2024-05-19 VITALS — BP 124/70 | HR 88 | Ht 65.0 in | Wt 238.0 lb

## 2024-05-19 DIAGNOSIS — K580 Irritable bowel syndrome with diarrhea: Secondary | ICD-10-CM

## 2024-05-19 DIAGNOSIS — Z1211 Encounter for screening for malignant neoplasm of colon: Secondary | ICD-10-CM | POA: Diagnosis not present

## 2024-05-19 DIAGNOSIS — K219 Gastro-esophageal reflux disease without esophagitis: Secondary | ICD-10-CM | POA: Diagnosis not present

## 2024-05-19 NOTE — Progress Notes (Unsigned)
 HPI :   She reports having a colonoscopy around 2018 in [place], which she says was normal. She says she was recommended to repeat in 10 years.  Dr. Towana Flint       Past Medical History:  Diagnosis Date   Abnormal stress test: Anterior wall ischemia cardiac cath after that showing 25% RCA no LAD disease 02/12/2022   Acquired absence of left breast 08/18/2020   Allergic rhinitis 03/10/2015   Anxiety    Anxiety and depression 08/30/2021   Arthritis    Asthma    Bilateral carpal tunnel syndrome 08/25/2018   BMI 40.0-44.9, adult (HCC) 08/23/2019   Breast cancer (HCC) 2021   Carpal tunnel syndrome    Carpal tunnel syndrome    bilateral   Chronic kidney disease    stage 3   Depression    Diabetes (HCC)    Dyslipidemia 03/27/2022   Essential hypertension 02/12/2022   Fatigue 08/30/2021   Fibroid 05/04/2019   Fibroids, intramural 08/09/2019   GERD (gastroesophageal reflux disease)    Hemodynamic instability 03/10/2015   Hypertension    Idiopathic urticaria 03/10/2015   Iron deficiency anemia due to chronic blood loss 08/09/2019   Irritable bowel syndrome    Malignant neoplasm of female breast (HCC) 04/17/2022   Moderate persistent asthma 03/10/2015   Obesity    Pain in both hands 07/16/2018   PMB (postmenopausal bleeding) 04/17/2022   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   Severe anemia 12/14/2015   Type 2 diabetes mellitus without complication, without long-term current use of insulin (HCC) 02/12/2022     Past Surgical History:  Procedure Laterality Date   caesarean     CESAREAN SECTION     CHOLECYSTECTOMY     DILATION AND CURETTAGE OF UTERUS     HIP SURGERY Left    LEFT HEART CATH AND CORONARY ANGIOGRAPHY N/A 02/13/2022   Procedure: LEFT HEART CATH AND CORONARY ANGIOGRAPHY;  Surgeon: Dann Candyce RAMAN, MD;  Location: [facility];  Service: Cardiovascular;  Laterality: N/A;   MASTECTOMY     PORT A CATH  INJECTION ([facility] HX)  2021   Family History  Problem Relation Age of Onset   Kidney disease Mother    Pulmonary fibrosis Father    Cancer Maternal Grandmother        carcinoma of the vulva and possible ovarian cancer   Prostate cancer Maternal Grandfather    Lung cancer Paternal Grandfather        heavy smoker   Social History   Tobacco Use   Smoking status: Never   Smokeless tobacco: Never  Vaping Use   Vaping status: Never Used  Substance Use Topics   Alcohol use: No   Drug use: No   Current Outpatient Medications  Medication Sig Dispense Refill   AIRSUPRA  90-80 MCG/ACT AERO Inhale 2 puffs into the lungs as needed (every 4 to 6 hours for cough, wheeze, shortness of breath.  Rinse, mouth after use). 10.7 g 1   Allantoin 0.5 % GEL Apply 1 Application topically.     anastrozole  (ARIMIDEX ) 1 MG tablet TAKE 1 TABLET(1 MG) BY MOUTH DAILY 90 tablet 3   aspirin  EC 81 MG tablet Take 1 tablet (81 mg total) by mouth daily. Swallow whole. 90 tablet 3   Biotin 89999 MCG TBDP Take 10,000 mcg by mouth daily.     buprenorphine (BUTRANS) 10 MCG/HR PTWK Place 1 patch onto the skin once a week for 28 days. 4  patch 0   busPIRone (BUSPAR) 5 MG tablet TAKE 1 TABLET(5 MG) BY MOUTH TWICE DAILY 60 tablet 0   calcium  carbonate (TUMS EX) 750 MG chewable tablet Chew 1,500 mg by mouth daily as needed for heartburn.     cariprazine  (VRAYLAR ) 1.5 MG capsule TAKE 1 CAPSULE(1.5 MG) BY MOUTH DAILY 90 capsule 0   clorazepate  (TRANXENE ) 3.75 MG tablet Take one tablet daily as needed for anxiety/panic attacks. 30 tablet 2   EPINEPHrine  (EPIPEN  2-PAK) 0.3 mg/0.3 mL IJ SOAJ injection Use as directed for life-threatening allergic reaction. 1 each 3   famotidine  (PEPCID ) 40 MG tablet TAKE 1 TABLET BY MOUTH TWICE DAILY AS DIRECTED 60 tablet 3   FARXIGA 10 MG TABS tablet Take 10 mg by mouth daily.     fluticasone  (FLONASE ) 50 MCG/ACT nasal spray Place 2 sprays into both nostrils daily as needed for allergies or  rhinitis.     folic acid  (FOLVITE ) 1 MG tablet TAKE 1 TABLET BY MOUTH DAILY 30 tablet 5   loratadine  (CLARITIN ) 10 MG tablet TAKE 1 TABLET BY MOUTH ONCE DAILY AS NEEDED 90 tablet 1   MOUNJARO 7.5 MG/0.5ML Pen Inject 7.5 mg into the skin once a week.     ondansetron  (ZOFRAN ) 4 MG tablet TAKE 1 TABLET BY MOUTH EVERY 4 HOURS AS NEEDED FOR NAUSEA 30 tablet 0   potassium chloride  (KLOR-CON  M) 10 MEQ tablet TAKE 2 TABLETS(20 MEQ) BY MOUTH TWICE DAILY 120 tablet 5   prochlorperazine  (COMPAZINE ) 10 MG tablet TAKE 1 TABLET BY MOUTH EVERY 6 HOURS AS NEEDED FOR NAUSEA 90 tablet 1   rosuvastatin  (CRESTOR ) 20 MG tablet TAKE 1 TABLET(20 MG) BY MOUTH DAILY 90 tablet 3   senna-docusate (SENOKOT-S) 8.6-50 MG tablet Take 2 tablets by mouth daily.     topiramate  (TOPAMAX ) 50 MG tablet Take 1 tablet (50 mg total) by mouth daily. 90 tablet 3   UNABLE TO FIND Inject 1 each as directed See admin instructions. Allergy injections for cats, grass, and dust.     valsartan -hydrochlorothiazide (DIOVAN -HCT) 320-25 MG tablet Take 1 tablet by mouth daily.  0   Vilazodone  HCl (VIIBRYD ) 40 MG TABS Take 1 tablet (40 mg total) by mouth daily. TAKE 1 TABLET(40 MG) BY MOUTH DAILY 90 tablet 3   WIXELA INHUB 250-50 MCG/ACT AEPB INHALE 1 PUFF BY MOUTH TWICE DAILY FOR COUGH OR WHEEZE. RINSE MOUTH AFTER USE 60 each 1   No current facility-administered medications for this visit.   Allergies  Allergen Reactions   Effexor  [Venlafaxine ] Palpitations   Nsaids Other (See Comments)    Pt reports kidney disease  Pt reports kidney disease, Pt reports kidney disease  Pt reports kidney disease, Pt reports kidney disease    Pt reports kidney disease Pt reports kidney disease     Review of Systems: All systems reviewed and negative except where noted in HPI.    No results found.  Physical Exam: LMP 01/23/2011  Constitutional: Pleasant,well-developed, ***female in no acute distress. HEENT: Normocephalic and atraumatic. Conjunctivae  are normal. No scleral icterus. Neck supple.  Cardiovascular: Normal rate, regular rhythm.  Pulmonary/chest: Effort normal and breath sounds normal. No wheezing, rales or rhonchi. Abdominal: Soft, nondistended, nontender. Bowel sounds active throughout. There are no masses palpable. No hepatomegaly. Extremities: no edema Lymphadenopathy: No cervical adenopathy noted. Neurological: Alert and oriented to person place and time. Skin: Skin is warm and dry. No rashes noted. Psychiatric: Normal mood and affect. Behavior is normal.  CBC    Component Value  Date/Time   WBC 10.6 (H) 09/26/2023 1527   WBC 12.3 (H) 10/22/2008 0530   RBC 5.21 (H) 09/26/2023 1527   HGB 14.7 09/26/2023 1527   HGB 15.0 02/12/2022 1035   HCT 43.3 09/26/2023 1527   HCT 43.9 02/12/2022 1035   PLT 336 09/26/2023 1527   PLT 329 02/12/2022 1035   MCV 83.1 09/26/2023 1527   MCV 85 02/12/2022 1035   MCV 87 11/07/2020 0000   MCH 28.2 09/26/2023 1527   MCHC 33.9 09/26/2023 1527   RDW 13.7 09/26/2023 1527   RDW 13.1 02/12/2022 1035   LYMPHSABS 2.3 09/26/2023 1527   LYMPHSABS 1.6 02/12/2022 1035   MONOABS 0.6 09/26/2023 1527   EOSABS 0.4 09/26/2023 1527   EOSABS 0.2 02/12/2022 1035   BASOSABS 0.1 09/26/2023 1527   BASOSABS 0.1 02/12/2022 1035    CMP     Component Value Date/Time   NA 138 09/26/2023 1527   NA 137 02/12/2022 1035   K 3.8 09/26/2023 1527   CL 102 09/26/2023 1527   CO2 24 09/26/2023 1527   GLUCOSE 118 (H) 09/26/2023 1527   BUN 24 (H) 09/26/2023 1527   BUN 16 02/12/2022 1035   CREATININE 1.49 (H) 09/26/2023 1527   CALCIUM  9.8 09/26/2023 1527   PROT 7.7 09/26/2023 1527   ALBUMIN 4.4 09/26/2023 1527   AST 18 09/26/2023 1527   ALT 15 09/26/2023 1527   ALKPHOS 112 09/26/2023 1527   BILITOT 0.3 09/26/2023 1527   GFRNONAA 41 (L) 09/26/2023 1527   GFRAA  10/20/2008 2105    >60        The eGFR has been calculated using the MDRD equation. This calculation has not been validated in all  clinical situations. eGFR's persistently <60 mL/min signify possible Chronic Kidney Disease.       Latest Ref Rng & Units 09/26/2023    3:27 PM 03/28/2023    3:54 PM 05/27/2022    2:07 PM  CBC EXTENDED  WBC 4.0 - 10.5 K/uL 10.6  9.3  10.1   RBC 3.87 - 5.11 MIL/uL 5.21  5.39  4.85   Hemoglobin 12.0 - 15.0 g/dL 85.2  85.1  86.2   HCT 36.0 - 46.0 % 43.3  45.2  41.4   Platelets 150 - 400 K/uL 336  349  347   NEUT# 1.7 - 7.7 K/uL 7.1  5.7  7.1   Lymph# 0.7 - 4.0 K/uL 2.3  2.5  1.9       ASSESSMENT AND PLAN:  Trinidad Hun, MD

## 2024-05-19 NOTE — Patient Instructions (Signed)
 Your provider has requested that you go to the basement level for lab work before leaving today. Press B on the elevator. The lab is located at the first door on the left as you exit the elevator.  Please purchase the following medications over the counter and take as directed: Metamucil daily.   We do not have a schedule available for February. Please contact our office in December to scheduled for February.   _______________________________________________________  If your blood pressure at your visit was 140/90 or greater, please contact your primary care physician to follow up on this.  _______________________________________________________  If you are age 56 or older, your body mass index should be between 23-30. Your Body mass index is 39.61 kg/m. If this is out of the aforementioned range listed, please consider follow up with your Primary Care Provider.  If you are age 56 or younger, your body mass index should be between 19-25. Your Body mass index is 39.61 kg/m. If this is out of the aformentioned range listed, please consider follow up with your Primary Care Provider.   ________________________________________________________  The [facility] providers would like to encourage you to use MYCHART to communicate with providers for non-urgent requests or questions.  Due to long hold times on the telephone, sending your provider a message by [facility] may be a faster and more efficient way to get a response.  Please allow 48 business hours for a response.  Please remember that this is for non-urgent requests.  _______________________________________________________  [facility] is using a team-based approach to care.  Your team is made up of your doctor and two to three APPS. Our APPS (Nurse Practitioners and Physician Assistants) work with your physician to ensure care continuity for you. They are fully qualified to address your health concerns and develop a treatment plan.  They communicate directly with your gastroenterologist to care for you. Seeing the Advanced Practice Practitioners on your physician's team can help you by facilitating care more promptly, often allowing for earlier appointments, access to diagnostic testing, procedures, and other specialty referrals.

## 2024-05-23 LAB — ALPHA-GAL PANEL
Allergen, Mutton, f88: <0.1 kU/L
Allergen, Pork, f26: <0.1 kU/L
Beef: <0.1 kU/L
CLASS: 0
CLASS: 0
Class: 0
GALACTOSE-ALPHA-1,3-GALACTOSE IGE*: <0.1 kU/L (ref <0.10)

## 2024-05-23 LAB — IGA: Immunoglobulin A: 369 mg/dL — ABNORMAL HIGH (ref 47–310)

## 2024-05-23 LAB — TISSUE TRANSGLUTAMINASE, IGA: (tTG) Ab, IgA: <1 U/mL

## 2024-05-23 LAB — INTERPRETATION:

## 2024-05-24 ENCOUNTER — Encounter: Payer: Self-pay | Admitting: Adult Health

## 2024-05-24 ENCOUNTER — Encounter: Payer: Self-pay | Admitting: Physical Medicine and Rehabilitation

## 2024-05-24 ENCOUNTER — Telehealth: Admitting: Adult Health

## 2024-05-24 DIAGNOSIS — F331 Major depressive disorder, recurrent, moderate: Secondary | ICD-10-CM

## 2024-05-24 DIAGNOSIS — G4700 Insomnia, unspecified: Secondary | ICD-10-CM

## 2024-05-24 DIAGNOSIS — F419 Anxiety disorder, unspecified: Secondary | ICD-10-CM

## 2024-05-24 DIAGNOSIS — F410 Panic disorder [episodic paroxysmal anxiety] without agoraphobia: Secondary | ICD-10-CM

## 2024-05-24 DIAGNOSIS — F32A Depression, unspecified: Secondary | ICD-10-CM

## 2024-05-24 DIAGNOSIS — F411 Generalized anxiety disorder: Secondary | ICD-10-CM

## 2024-05-24 NOTE — Progress Notes (Signed)
 Laurie Simmons [id number] January 19, 1966 58 y.o.  Virtual Visit via Video Note  I connected with pt @ on 05/24/24 at  9:30 AM EST by a video enabled telemedicine application and verified that I am speaking with the correct person using two identifiers.   I discussed the limitations of evaluation and management by telemedicine and the availability of in person appointments. The patient expressed understanding and agreed to proceed.  I discussed the assessment and treatment plan with the patient. The patient was provided an opportunity to ask questions and all were answered. The patient agreed with the plan and demonstrated an understanding of the instructions.   The patient was advised to call back or seek an in-person evaluation if the symptoms worsen or if the condition fails to improve as anticipated.  I provided 25 minutes of non-face-to-face time during this encounter.  The patient was located at home.  The provider was located at [facility].   Laurie LOISE Sayers, NP   Subjective:   Patient ID:  Laurie Simmons is a 57 y.o. (DOB Jan 13, 1966) female.  Chief Complaint: No chief complaint on file.   HPI Laurie Simmons presents for follow-up of anxiety, panic attacks, depression, insomnia.  Describes mood today as "ok". Pleasant. Denies tearfulness. Mood symptoms - denies depression, but feels overwhelmed. Reports improved interest and motivation better over the past few weeks. Reports anxiety - better than it was. Denies irritability. Denies panic attacks - but has little mini attacks. Reports worry, rumination and over thinking. Reports situational stressors. Reports mother with recent Alzheimer's disease and feels anxious about that. Reports mood is stable. Stating I feel like I'm doing ok. Feels like current medications are helpful. Taking medications as prescribed.  Energy levels lower - back pain bad. Active, does not have a regular exercise routine.  Enjoys some usual interests  and activities. Married. Lives with husband and 2 children. Appetite adequate. Reports weight gain - GI issues. Sleeping well most nights. Averages 7 to 9 hours. Reports some daytime napping - not as much. Reports focus and concentration difficulties - in general. Completing tasks. Managing aspects of household. Retired. Denies AH or VH. Denies SI or HI. Denies self-harm. Denies substance use.  Therapist - Laurie Simmons.  Previous medications: Abilify    Review of Systems:  Review of Systems  Musculoskeletal:  Negative for gait problem.  Neurological:  Negative for tremors.  Psychiatric/Behavioral:         Please refer to HPI    Medications: I have reviewed the patient's current medications.  Current Outpatient Medications  Medication Sig Dispense Refill   AIRSUPRA  90-80 MCG/ACT AERO Inhale 2 puffs into the lungs as needed (every 4 to 6 hours for cough, wheeze, shortness of breath.  Rinse, mouth after use). 10.7 g 1   anastrozole  (ARIMIDEX ) 1 MG tablet TAKE 1 TABLET(1 MG) BY MOUTH DAILY 90 tablet 3   aspirin  EC 81 MG tablet Take 1 tablet (81 mg total) by mouth daily. Swallow whole. 90 tablet 3   Biotin 89999 MCG TBDP Take 10,000 mcg by mouth daily.     buprenorphine (BUTRANS) 10 MCG/HR PTWK Place 1 patch onto the skin once a week for 28 days. 4 patch 0   busPIRone (BUSPAR) 5 MG tablet TAKE 1 TABLET(5 MG) BY MOUTH TWICE DAILY 60 tablet 0   calcium  carbonate (TUMS EX) 750 MG chewable tablet Chew 1,500 mg by mouth daily as needed for heartburn.     cariprazine  (VRAYLAR ) 1.5 MG capsule TAKE  1 CAPSULE(1.5 MG) BY MOUTH DAILY 90 capsule 0   EPINEPHrine  (EPIPEN  2-PAK) 0.3 mg/0.3 mL IJ SOAJ injection Use as directed for life-threatening allergic reaction. 1 each 3   famotidine  (PEPCID ) 40 MG tablet TAKE 1 TABLET BY MOUTH TWICE DAILY AS DIRECTED 60 tablet 3   FARXIGA 10 MG TABS tablet Take 10 mg by mouth daily.     fluticasone  (FLONASE ) 50 MCG/ACT nasal spray Place 2 sprays into both  nostrils daily as needed for allergies or rhinitis.     folic acid  (FOLVITE ) 1 MG tablet TAKE 1 TABLET BY MOUTH DAILY 30 tablet 5   loratadine  (CLARITIN ) 10 MG tablet TAKE 1 TABLET BY MOUTH ONCE DAILY AS NEEDED 90 tablet 1   MOUNJARO 7.5 MG/0.5ML Pen Inject 7.5 mg into the skin once a week.     ondansetron  (ZOFRAN ) 4 MG tablet TAKE 1 TABLET BY MOUTH EVERY 4 HOURS AS NEEDED FOR NAUSEA 30 tablet 0   potassium chloride  (KLOR-CON  M) 10 MEQ tablet TAKE 2 TABLETS(20 MEQ) BY MOUTH TWICE DAILY 120 tablet 5   prochlorperazine  (COMPAZINE ) 10 MG tablet TAKE 1 TABLET BY MOUTH EVERY 6 HOURS AS NEEDED FOR NAUSEA 90 tablet 1   rosuvastatin  (CRESTOR ) 20 MG tablet TAKE 1 TABLET(20 MG) BY MOUTH DAILY 90 tablet 3   senna-docusate (SENOKOT-S) 8.6-50 MG tablet Take 2 tablets by mouth daily.     topiramate  (TOPAMAX ) 50 MG tablet Take 1 tablet (50 mg total) by mouth daily. 90 tablet 3   UNABLE TO FIND Inject 1 each as directed See admin instructions. Allergy injections for cats, grass, and dust.     valsartan -hydrochlorothiazide (DIOVAN -HCT) 320-25 MG tablet Take 1 tablet by mouth daily.  0   Vilazodone  HCl (VIIBRYD ) 40 MG TABS Take 1 tablet (40 mg total) by mouth daily. TAKE 1 TABLET(40 MG) BY MOUTH DAILY 90 tablet 3   WIXELA INHUB 250-50 MCG/ACT AEPB INHALE 1 PUFF BY MOUTH TWICE DAILY FOR COUGH OR WHEEZE. RINSE MOUTH AFTER USE 60 each 1   No current facility-administered medications for this visit.    Medication Side Effects: None  Allergies:  Allergies  Allergen Reactions   Effexor  [Venlafaxine ] Palpitations   Nsaids Other (See Comments)    Pt reports kidney disease  Pt reports kidney disease, Pt reports kidney disease  Pt reports kidney disease, Pt reports kidney disease    Pt reports kidney disease Pt reports kidney disease    Past Medical History:  Diagnosis Date   Abnormal stress test: Anterior wall ischemia cardiac cath after that showing 25% RCA no LAD disease 02/12/2022   Acquired absence of  left breast 08/18/2020   Allergic rhinitis 03/10/2015   Anxiety    Anxiety and depression 08/30/2021   Arthritis    Asthma    Bilateral carpal tunnel syndrome 08/25/2018   BMI 40.0-44.9, adult (HCC) 08/23/2019   Breast cancer (HCC) 2021   Carpal tunnel syndrome    Carpal tunnel syndrome    bilateral   Chronic kidney disease    stage 3   Depression    Diabetes (HCC)    Dyslipidemia 03/27/2022   Essential hypertension 02/12/2022   Fatigue 08/30/2021   Fibroid 05/04/2019   Fibroids, intramural 08/09/2019   GERD (gastroesophageal reflux disease)    Hemodynamic instability 03/10/2015   Hypertension    Idiopathic urticaria 03/10/2015   Iron deficiency anemia due to chronic blood loss 08/09/2019   Irritable bowel syndrome    Malignant neoplasm of female breast (HCC) 04/17/2022  Moderate persistent asthma 03/10/2015   Obesity    Pain in both hands 07/16/2018   PMB (postmenopausal bleeding) 04/17/2022   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   Severe anemia 12/14/2015   Type 2 diabetes mellitus without complication, without long-term current use of insulin (HCC) 02/12/2022    Family History  Problem Relation Age of Onset   Kidney disease Mother    Pulmonary fibrosis Father    Cancer Maternal Grandmother        carcinoma of the vulva and possible ovarian cancer   Prostate cancer Maternal Grandfather    Lung cancer Paternal Grandfather        heavy smoker    Social History   Socioeconomic History   Marital status: Married    Spouse name: Phil   Number of children: 2   Years of education: Not on file   Highest education level: Not on file  Occupational History   Not on file  Tobacco Use   Smoking status: Never   Smokeless tobacco: Never  Vaping Use   Vaping status: Never Used  Substance and Sexual Activity   Alcohol use: No   Drug use: No   Sexual activity: Yes  Other Topics Concern   Not on file  Social History Narrative   Not  on file   Social Drivers of Health   Financial Resource Strain: Not on file  Food Insecurity: Low Risk  (03/16/2024)   Received from [facility]   Hunger Vital Sign    Within the past 12 months, you worried that your food would run out before you got money to buy more: Never true    Within the past 12 months, the food you bought just didn't last and you didn't have money to get more. : Never true  Transportation Needs: No Transportation Needs (03/16/2024)   Received from [facility]    In the past 12 months, has lack of reliable transportation kept you from medical appointments, meetings, work or from getting things needed for daily living? : No  Physical Activity: Not on file  Stress: Not on file  Social Connections: Not on file  Intimate Partner Violence: Not on file    Past Medical History, Surgical history, Social history, and Family history were reviewed and updated as appropriate.   Please see review of systems for further details on the patient's review from today.   Objective:   Physical Exam:  LMP 01/23/2011   Physical Exam Constitutional:      General: She is not in acute distress. Musculoskeletal:        General: No deformity.  Neurological:     Mental Status: She is alert and oriented to person, place, and time.     Coordination: Coordination normal.  Psychiatric:        Attention and Perception: Attention and perception normal. She does not perceive auditory or visual hallucinations.        Mood and Affect: Mood normal. Mood is not anxious or depressed. Affect is not labile, blunt, angry or inappropriate.        Speech: Speech normal.        Behavior: Behavior normal.        Thought Content: Thought content normal. Thought content is not paranoid or delusional. Thought content does not include homicidal or suicidal ideation. Thought content does not include homicidal or suicidal plan.        Cognition and Memory: Cognition and memory  normal.         Judgment: Judgment normal.     Comments: Insight intact     Lab Review:     Component Value Date/Time   NA 138 09/26/2023 1527   NA 137 02/12/2022 1035   K 3.8 09/26/2023 1527   CL 102 09/26/2023 1527   CO2 24 09/26/2023 1527   GLUCOSE 118 (H) 09/26/2023 1527   BUN 24 (H) 09/26/2023 1527   BUN 16 02/12/2022 1035   CREATININE 1.49 (H) 09/26/2023 1527   CALCIUM  9.8 09/26/2023 1527   PROT 7.7 09/26/2023 1527   ALBUMIN 4.4 09/26/2023 1527   AST 18 09/26/2023 1527   ALT 15 09/26/2023 1527   ALKPHOS 112 09/26/2023 1527   BILITOT 0.3 09/26/2023 1527   GFRNONAA 41 (L) 09/26/2023 1527   GFRAA  10/20/2008 2105    >60        The eGFR has been calculated using the MDRD equation. This calculation has not been validated in all clinical situations. eGFR's persistently <60 mL/min signify possible Chronic Kidney Disease.       Component Value Date/Time   WBC 10.6 (H) 09/26/2023 1527   WBC 12.3 (H) 10/22/2008 0530   RBC 5.21 (H) 09/26/2023 1527   HGB 14.7 09/26/2023 1527   HGB 15.0 02/12/2022 1035   HCT 43.3 09/26/2023 1527   HCT 43.9 02/12/2022 1035   PLT 336 09/26/2023 1527   PLT 329 02/12/2022 1035   MCV 83.1 09/26/2023 1527   MCV 85 02/12/2022 1035   MCV 87 11/07/2020 0000   MCH 28.2 09/26/2023 1527   MCHC 33.9 09/26/2023 1527   RDW 13.7 09/26/2023 1527   RDW 13.1 02/12/2022 1035   LYMPHSABS 2.3 09/26/2023 1527   LYMPHSABS 1.6 02/12/2022 1035   MONOABS 0.6 09/26/2023 1527   EOSABS 0.4 09/26/2023 1527   EOSABS 0.2 02/12/2022 1035   BASOSABS 0.1 09/26/2023 1527   BASOSABS 0.1 02/12/2022 1035    No results found for: POCLITH, LITHIUM   No results found for: PHENYTOIN, PHENOBARB, VALPROATE, CBMZ   .res Assessment: Plan:    Plan:  1. Viibryd  40mg  daily 2. Buspar 5mg  BID 3. Topamax  50 mg daily 4. Vraylar  1.5mg  daily  D/C Tranxene  3.75mg  once daily - started on Butrans patch  RTC 3 months  25 minutes spent dedicated to the care of this  patient on the date of this encounter to include pre-visit review of records, ordering of medication, post visit documentation, and face-to-face time with the patient anxiety, panic attacks, depression, insomnia. Discussed continuing current medication regimen. Plans to speak with PCP regarding fatigue.  Patient advised to contact office with any questions, adverse effects, or acute worsening in signs and symptoms.  Discussed potential benefits, risk, and side effects of benzodiazepines to include potential risk of tolerance and dependence, as well as possible drowsiness. Advised patient not to drive if experiencing drowsiness and to take lowest possible effective dose to minimize risk of dependence and tolerance.  Discussed potential metabolic side effects associated with atypical antipsychotics, as well as potential risk for movement side effects. Advised pt to contact office if movement side effects occur.    There are no diagnoses linked to this encounter.   Please see After Visit Summary for patient specific instructions.  Future Appointments  Date Time Provider Department Center  05/24/2024  9:30 AM Jaidence Geisler, Laurie Mattocks, NP [facility] None  05/26/2024 10:00 AM Sherlynn Sober, LCSW [facility] None  06/04/2024 12:40 PM GI-315 MR 3 [id number] [facility].  [facility]  06/15/2024  1:40 PM Debby Fidela CROME, NP [facility] CPR  06/23/2024 10:00 AM Sherlynn Sober, LCSW [facility] None  06/24/2024 10:00 AM Kozlow, Camellia PARAS, MD [facility] None  10/08/2024  3:45 PM [facility] None  10/08/2024  4:30 PM Cornelius Wanda DEL, MD [facility] None    No orders of the defined types were placed in this encounter.     -------------------------------

## 2024-05-25 ENCOUNTER — Other Ambulatory Visit: Payer: Self-pay

## 2024-05-25 DIAGNOSIS — E876 Hypokalemia: Secondary | ICD-10-CM

## 2024-05-25 MED ORDER — POTASSIUM CHLORIDE CRYS ER 10 MEQ PO TBCR: 20.0000 meq | EXTENDED_RELEASE_TABLET | Freq: Two times a day (BID) | ORAL | 1 refills | Status: AC

## 2024-05-26 ENCOUNTER — Ambulatory Visit: Admitting: Psychiatry

## 2024-05-28 ENCOUNTER — Ambulatory Visit: Payer: Self-pay | Admitting: Gastroenterology

## 2024-05-28 NOTE — Progress Notes (Signed)
 Laurie Simmons,  Your tests for celiac disease and alpha gal syndrome were negative (normal).  Your total IgA was very slightly elevated.  This is a nonspecific finding and does not indicate any particular disease, but has been seen associated with irritable bowel syndrome.   Please continue to track your symptoms/diet, avoiding known trigger foods and follow up as needed in our office.

## 2024-06-03 ENCOUNTER — Telehealth: Payer: Self-pay

## 2024-06-03 NOTE — Telephone Encounter (Signed)
 Pt needs prescription faxed to [facility] supplies. Pt has appt this week.

## 2024-06-04 ENCOUNTER — Ambulatory Visit
Admission: RE | Admit: 2024-06-04 | Discharge: 2024-06-04 | Disposition: A | Discharge status: Home / Self Care | Source: Ambulatory Visit | Attending: Physical Medicine and Rehabilitation | Admitting: Physical Medicine and Rehabilitation

## 2024-06-04 DIAGNOSIS — M545 Low back pain, unspecified: Secondary | ICD-10-CM

## 2024-06-04 DIAGNOSIS — M47816 Spondylosis without myelopathy or radiculopathy, lumbar region: Secondary | ICD-10-CM

## 2024-06-04 DIAGNOSIS — M48061 Spinal stenosis, lumbar region without neurogenic claudication: Secondary | ICD-10-CM | POA: Diagnosis not present

## 2024-06-04 DIAGNOSIS — M5126 Other intervertebral disc displacement, lumbar region: Secondary | ICD-10-CM | POA: Diagnosis not present

## 2024-06-07 ENCOUNTER — Ambulatory Visit (INDEPENDENT_AMBULATORY_CARE_PROVIDER_SITE_OTHER): Payer: Self-pay | Admitting: *Deleted

## 2024-06-07 DIAGNOSIS — J309 Allergic rhinitis, unspecified: Secondary | ICD-10-CM | POA: Diagnosis not present

## 2024-06-08 NOTE — Progress Notes (Deleted)
 Subjective:    Patient ID: Laurie Simmons, female    DOB: 1966-05-16, 58 y.o.   MRN: [id number]  HPI   Pain Inventory Average Pain {NUMBERS; 0-10:5044} Pain Right Now {NUMBERS; 0-10:5044} My pain is {PAIN DESCRIPTION:21022940}  In the last 24 hours, has pain interfered with the following? General activity {NUMBERS; 0-10:5044} Relation with others {NUMBERS; 0-10:5044} Enjoyment of life {NUMBERS; 0-10:5044} What TIME of day is your pain at its worst? {time of day:24191} Sleep (in general) {BHH GOOD/FAIR/POOR:22877}  Pain is worse with: {ACTIVITIES:21022942} Pain improves with: {PAIN IMPROVES TPUY:78977056} Relief from Meds: {NUMBERS; 0-10:5044}  Family History  Problem Relation Age of Onset   Kidney disease Mother    Pulmonary fibrosis Father    Cancer Maternal Grandmother        carcinoma of the vulva and possible ovarian cancer   Prostate cancer Maternal Grandfather    Lung cancer Paternal Grandfather        heavy smoker   Social History   Socioeconomic History   Marital status: Married    Spouse name: Phil   Number of children: 2   Years of education: Not on file   Highest education level: Not on file  Occupational History   Not on file  Tobacco Use   Smoking status: Never   Smokeless tobacco: Never  Vaping Use   Vaping status: Never Used  Substance and Sexual Activity   Alcohol use: No   Drug use: No   Sexual activity: Yes  Other Topics Concern   Not on file  Social History Narrative   Not on file   Social Drivers of Health   Financial Resource Strain: Not on file  Food Insecurity: Low Risk  (03/16/2024)   Received from [facility]   Hunger Vital Sign    Within the past 12 months, you worried that your food would run out before you got money to buy more: Never true    Within the past 12 months, the food you bought just didn't last and you didn't have money to get more. : Never true  Transportation Needs: No Transportation Needs (03/16/2024)   Received  from [facility]    In the past 12 months, has lack of reliable transportation kept you from medical appointments, meetings, work or from getting things needed for daily living? : No  Physical Activity: Not on file  Stress: Not on file  Social Connections: Not on file   Past Surgical History:  Procedure Laterality Date   caesarean     CESAREAN SECTION     CHOLECYSTECTOMY     DILATION AND CURETTAGE OF UTERUS     HIP SURGERY Left    LEFT HEART CATH AND CORONARY ANGIOGRAPHY N/A 02/13/2022   Procedure: LEFT HEART CATH AND CORONARY ANGIOGRAPHY;  Surgeon: Dann Candyce RAMAN, MD;  Location: [facility] INVASIVE CV LAB;  Service: Cardiovascular;  Laterality: N/A;   MASTECTOMY     PORT A CATH INJECTION ([facility] HX)  2021   Past Surgical History:  Procedure Laterality Date   caesarean     CESAREAN SECTION     CHOLECYSTECTOMY     DILATION AND CURETTAGE OF UTERUS     HIP SURGERY Left    LEFT HEART CATH AND CORONARY ANGIOGRAPHY N/A 02/13/2022   Procedure: LEFT HEART CATH AND CORONARY ANGIOGRAPHY;  Surgeon: Dann Candyce RAMAN, MD;  Location: [facility] INVASIVE CV LAB;  Service: Cardiovascular;  Laterality: N/A;   MASTECTOMY     PORT A  CATH INJECTION ([facility] HX)  2021   Past Medical History:  Diagnosis Date   Abnormal stress test: Anterior wall ischemia cardiac cath after that showing 25% RCA no LAD disease 02/12/2022   Acquired absence of left breast 08/18/2020   Allergic rhinitis 03/10/2015   Anxiety    Anxiety and depression 08/30/2021   Arthritis    Asthma    Bilateral carpal tunnel syndrome 08/25/2018   BMI 40.0-44.9, adult (HCC) 08/23/2019   Breast cancer (HCC) 2021   Carpal tunnel syndrome    Carpal tunnel syndrome    bilateral   Chronic kidney disease    stage 3   Depression    Diabetes (HCC)    Dyslipidemia 03/27/2022   Essential hypertension 02/12/2022   Fatigue 08/30/2021   Fibroid 05/04/2019   Fibroids, intramural 08/09/2019   GERD (gastroesophageal reflux  disease)    Hemodynamic instability 03/10/2015   Hypertension    Idiopathic urticaria 03/10/2015   Iron deficiency anemia due to chronic blood loss 08/09/2019   Irritable bowel syndrome    Malignant neoplasm of female breast (HCC) 04/17/2022   Moderate persistent asthma 03/10/2015   Obesity    Pain in both hands 07/16/2018   PMB (postmenopausal bleeding) 04/17/2022   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   Severe anemia 12/14/2015   Type 2 diabetes mellitus without complication, without long-term current use of insulin (HCC) 02/12/2022   LMP 01/23/2011   Opioid Risk Score:   Fall Risk Score:  `1  Depression screen [facility] 2/9     03/17/2024   10:02 AM 05/02/2023   12:46 PM 03/21/2023   10:13 AM 02/19/2023   11:26 AM 01/15/2023   10:40 AM  Depression screen PHQ 2/9  Decreased Interest 0 0 0 0 1  Down, Depressed, Hopeless 0 0 0 0 1  PHQ - 2 Score 0 0 0 0 2  Altered sleeping     1  Tired, decreased energy     1  Change in appetite     0  Feeling bad or failure about yourself      0  Trouble concentrating     1  Moving slowly or fidgety/restless     0  Suicidal thoughts     0  PHQ-9 Score     5      Data saved with a previous flowsheet row definition    Review of Systems     Objective:   Physical Exam        Assessment & Plan:

## 2024-06-15 ENCOUNTER — Encounter: Admitting: Registered Nurse

## 2024-06-15 NOTE — Addendum Note (Signed)
 Addended by: EMELINE SEARCH on: 06/15/2024 10:56 PM   Modules accepted: Orders

## 2024-06-18 ENCOUNTER — Other Ambulatory Visit: Payer: Self-pay | Admitting: Adult Health

## 2024-06-23 ENCOUNTER — Ambulatory Visit: Admitting: Psychiatry

## 2024-06-23 DIAGNOSIS — F331 Major depressive disorder, recurrent, moderate: Secondary | ICD-10-CM

## 2024-06-23 NOTE — Progress Notes (Signed)
 [facility] Counselor/Therapist Progress Note  Patient ID: Laurie Simmons, MRN: [id number],    Date: 06/23/2024  Time Spent: 55 minutes   Treatment Type: Individual Therapy  Reported Symptoms: anxiety, depression, overwhelmed at times, trying not to look just for the negatives, challenges with elderly mom    Mental Status Exam:  Appearance:   Casual     Behavior:  Appropriate, Sharing, and Motivated  Motor:  Normal  Speech/Language:   Clear and Coherent  Affect:  Depressed and anxious  Mood:  anxious and depressed  Thought process:  goal directed  Thought content:    Rumination  Sensory/Perceptual disturbances:    WNL  Orientation:  oriented to person, place, time/date, situation, day of week, month of year, year, and stated date of Dec. 10, 2025  Attention:  Good  Concentration:  Good and Fair  Memory:  WNL  Fund of knowledge:   Good  Insight:    Good and Fair  Judgment:   Good  Impulse Control:  Good   Risk Assessment: Danger to Self:  No Self-injurious Behavior: No Danger to Others: No Duty to Warn:no Physical Aggression / Violence:No  Access to Firearms a concern: No  Gang Involvement:No   Subjective:  Patient in session today and working further on her anxiety and depression, frustrations with multiple family situations, concerned for her elderly mom (80) and some declining memory issues. More ruminating about the past and some bad things that have happened and then ended up dwelling on it, impacting my sleep.  Some better now. Find myself trying to  to do that but feels out of my control. Waking up some during night and start thinking but am not really stressed. Needing to help mother a lot more in order for her to get her needs met; is still living in her own home with assistance from patient and family.  Seem to appreciate the session today especially giving her opportunity to express her concerns and her frustrations regarding her mother's declining health  mentally and emotionally and appreciated feeling supported in this.  Some feelings of being overwhelmed and family is helping out more as needed.  Today talking through a lot of anxiety, concerns, and frustrations in all the things that she is dealing with right now.  Continue to encourage her in her own self-care.  She is trying to better balance her anxiety and depression and especially the sadness and seeing her mom decline, along with making sure her mom's physical needs are met and also getting her to appointments and making sure she has her medications.  Patient also working on her own issues of anxiety and depression but feels more concerned currently as she is continuing to have some back pain and other treatments have not been effective.  She is scheduled to see a neurosurgeon for evaluation on December 31st.   Interventions: Cognitive Behavioral Therapy, Solution-Oriented/Positive Psychology, and Ego-Supportive Long-term goal: Develop healthy cognitive patterns and beliefs about self and the world that lead to alleviation and help prevent the relapse of depressive symptoms Short-term goal: Replace negative self-defeating self-talk with verbalization of realistic and positive cognitive messages. Strategies: Reinforce positive, reality-based cognitive messages that enhance self-confidence and increase adaptive action.   Diagnosis:   ICD-10-CM   1. Major depressive disorder, recurrent episode, moderate (HCC)  F33.1      Plan:   Patient working further on her depression, some fears of future, and anxiety, especially regarding her mom's decline and patient's health  going into her future. Fears for the safety of her mom with her declining memory. Working on better self-care in the midst of this.  Really needed session to vent her concerns and emotions regarding her mother's decline and also some of her own health and emotional issues.  Really good motivation in session today and was given that  feedback which seemed to be helpful.  Continue to encourage her to be open with friends and fellow church members that can also be of support to her as needed continues working on excepting mom's diagnosis and supporting her however possible, while also having good limits so as to care for her own self and her health needs.  Encouraging patient to stay in contact with people who are supportive of her, refrain from assuming that things are always going to go in a negative direction, get outside some each day, healthy nutrition and exercise as she is able, positive and encouraging self talk, challenge and counteract her self-doubt, reduce her overthinking, and recognize the strengths she shows when she works with goal-directed behaviors to move in a direction that supports her overall improved health and wellbeing.  Taetum Steve has made some progress gradually and definitely needs to continue working with goal-directed behaviors and receiving support to keep moving in a more hopeful and healthier direction moving forward.  Goal review and progress/challenges noted with patient.  Next appointment within 3 to 4 weeks.   Barnie Bunde, LCSW

## 2024-06-24 ENCOUNTER — Encounter: Payer: Self-pay | Admitting: Allergy and Immunology

## 2024-06-24 ENCOUNTER — Ambulatory Visit: Admitting: Allergy and Immunology

## 2024-06-24 VITALS — BP 112/94 | HR 96 | Temp 99.1°F | Resp 18 | Ht 63.8 in | Wt 230.8 lb

## 2024-06-24 DIAGNOSIS — J4540 Moderate persistent asthma, uncomplicated: Secondary | ICD-10-CM

## 2024-06-24 DIAGNOSIS — J3089 Other allergic rhinitis: Secondary | ICD-10-CM | POA: Diagnosis not present

## 2024-06-24 DIAGNOSIS — J988 Other specified respiratory disorders: Secondary | ICD-10-CM | POA: Diagnosis not present

## 2024-06-24 DIAGNOSIS — K219 Gastro-esophageal reflux disease without esophagitis: Secondary | ICD-10-CM

## 2024-06-24 DIAGNOSIS — L989 Disorder of the skin and subcutaneous tissue, unspecified: Secondary | ICD-10-CM | POA: Diagnosis not present

## 2024-06-24 DIAGNOSIS — B9789 Other viral agents as the cause of diseases classified elsewhere: Secondary | ICD-10-CM

## 2024-06-24 MED ORDER — FLUTICASONE-SALMETEROL 250-50 MCG/ACT IN AEPB: INHALATION_SPRAY | RESPIRATORY_TRACT | 5 refills | Status: AC

## 2024-06-24 MED ORDER — MOMETASONE FUROATE 0.1 % EX OINT: TOPICAL_OINTMENT | CUTANEOUS | 3 refills | Status: AC

## 2024-06-24 NOTE — Progress Notes (Unsigned)
 [place] [facility] [place] [facility] [place] - [place] - [place]   Follow-up Note  Referring Provider: Trinidad Hun, MD Primary Provider: Trinidad Hun, MD Date of Office Visit: 06/24/2024  Subjective:   Laurie Simmons (DOB: [birth year]) is a 58 y.o. female who [facility] in re-evaluation of the following:  HPI: Laurie Simmons returns to this clinic in evaluation of asthma, allergic rhinoconjunctivitis, reflux.  I last saw her in this clinic 25 December 2023.  She has really been doing quite well regarding her asthma and was able to taper off her Wixela.  Likewise she has really been doing very well with her upper airway and has tapered off her Flonase .  She has not required a systemic steroid or an antibiotic for any type of airway issue.  She rarely uses a short acting bronchodilator and can exert herself without any problem and have cold air exposure without any problem.  She continues on immunotherapy currently at every 4-week administration.  She thought that her reflux was under very good control while using famotidine  twice a day.  Unfortunately, 4 days ago she had acute onset of sneezing and coughing and postnasal drip without any anosmia, ugly nasal discharge, shortness of breath, chest tightness, chest pain, or sputum production.  Her son is at home with a upper respiratory tract infection with cough that started a few days prior to [name]'s events.  In addition, she has been having a pruritic dermatitis affecting her left anterior chest for the past 2 weeks.  She does excoriate this area.  There is not really an obvious provoking factor for why she is having this issue.  She has not really had any new contact exposures.  When I last saw her in this clinic because she was having significant problems with some form of gastrointestinal upset and diarrhea.  She subsequently saw GI who has evaluated her for alpha gal and celiac disease with apparently negative testing  and it is now believed that she has recrudescence of her irritable bowel syndrome.  Allergies as of 06/24/2024       Reactions   Effexor  [venlafaxine ] Palpitations   Nsaids Other (See Comments)   Pt reports kidney disease Pt reports kidney disease, Pt reports kidney disease Pt reports kidney disease, Pt reports kidney disease    Pt reports kidney disease Pt reports kidney disease        Medication List    Airsupra  90-80 MCG/ACT Aero Generic drug: Albuterol -Budesonide  Inhale 2 puffs into the lungs as needed (every 4 to 6 hours for cough, wheeze, shortness of breath.  Rinse, mouth after use).   anastrozole  1 MG tablet Commonly known as: ARIMIDEX  TAKE 1 TABLET(1 MG) BY MOUTH DAILY   aspirin  EC 81 MG tablet Take 1 tablet (81 mg total) by mouth daily. Swallow whole.   Biotin 89999 MCG Tbdp Take 10,000 mcg by mouth daily.   busPIRone  5 MG tablet Commonly known as: BUSPAR  TAKE 1 TABLET(5 MG) BY MOUTH TWICE DAILY   calcium  carbonate 750 MG chewable tablet Commonly known as: TUMS EX Chew 1,500 mg by mouth daily as needed for heartburn.   EPINEPHrine  0.3 mg/0.3 mL Soaj injection Commonly known as: EpiPen  2-Pak Use as directed for life-threatening allergic reaction.   famotidine  40 MG tablet Commonly known as: PEPCID  TAKE 1 TABLET BY MOUTH TWICE DAILY AS DIRECTED   Farxiga 10 MG Tabs tablet Generic drug: dapagliflozin propanediol Take 10 mg by mouth daily.   FIBER  PO Take by mouth daily.   fluticasone  50 MCG/ACT nasal spray Commonly known as: FLONASE  Place 2 sprays into both nostrils daily as needed for allergies or rhinitis.   folic acid  1 MG tablet Commonly known as: FOLVITE  TAKE 1 TABLET BY MOUTH DAILY   loratadine  10 MG tablet Commonly known as: CLARITIN  TAKE 1 TABLET BY MOUTH ONCE DAILY AS NEEDED   Mounjaro 7.5 MG/0.5ML Pen Generic drug: tirzepatide Inject 7.5 mg into the skin once a week.   ondansetron  4 MG tablet Commonly known as: ZOFRAN  TAKE 1  TABLET BY MOUTH EVERY 4 HOURS AS NEEDED FOR NAUSEA   potassium chloride  10 MEQ tablet Commonly known as: KLOR-CON  M Take 2 tablets (20 mEq total) by mouth 2 (two) times daily.   prochlorperazine 10 MG tablet Commonly known as: COMPAZINE TAKE 1 TABLET BY MOUTH EVERY 6 HOURS AS NEEDED FOR NAUSEA   rosuvastatin  20 MG tablet Commonly known as: CRESTOR  TAKE 1 TABLET(20 MG) BY MOUTH DAILY   senna-docusate 8.6-50 MG tablet Commonly known as: Senokot-S Take 2 tablets by mouth daily.   topiramate  50 MG tablet Commonly known as: TOPAMAX  Take 1 tablet (50 mg total) by mouth daily.   UNABLE TO FIND Inject 1 each as directed See admin instructions. Allergy injections for cats, grass, and dust.   valsartan-hydrochlorothiazide 320-25 MG tablet Commonly known as: DIOVAN-HCT Take 1 tablet by mouth daily.   Vilazodone  HCl 40 MG Tabs Commonly known as: VIIBRYD  Take 1 tablet (40 mg total) by mouth daily. TAKE 1 TABLET(40 MG) BY MOUTH DAILY   VITAMIN D-3 PO Take by mouth daily.   Vraylar  1.5 MG capsule Generic drug: cariprazine  TAKE 1 CAPSULE(1.5 MG) BY MOUTH DAILY   Wixela Inhub 250-50 MCG/ACT Aepb Generic drug: fluticasone -salmeterol INHALE 1 PUFF BY MOUTH TWICE DAILY FOR COUGH OR WHEEZE. RINSE MOUTH AFTER USE    Past Medical History:  Diagnosis Date   Abnormal stress test: Anterior wall ischemia cardiac cath after that showing 25% RCA no LAD disease 02/12/2022   Acquired absence of left breast 08/18/2020   Allergic rhinitis 03/10/2015   Anxiety    Anxiety and depression 08/30/2021   Arthritis    Asthma    Bilateral carpal tunnel syndrome 08/25/2018   BMI 40.0-44.9, adult (HCC) 08/23/2019   Breast cancer (HCC) 2021   Carpal tunnel syndrome    Carpal tunnel syndrome    bilateral   Chronic kidney disease    stage 3   Depression    Diabetes (HCC)    Dyslipidemia 03/27/2022   Essential hypertension 02/12/2022   Fatigue 08/30/2021   Fibroid 05/04/2019   Fibroids,  intramural 08/09/2019   GERD (gastroesophageal reflux disease)    Hemodynamic instability 03/10/2015   Hypertension    Idiopathic urticaria 03/10/2015   Iron deficiency anemia due to chronic blood loss 08/09/2019   Irritable bowel syndrome    Malignant neoplasm of female breast (HCC) 04/17/2022   Moderate persistent asthma 03/10/2015   Obesity    Pain in both hands 07/16/2018   PMB (postmenopausal bleeding) 04/17/2022   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   Severe anemia 12/14/2015   Type 2 diabetes mellitus without complication, without long-term current use of insulin (HCC) 02/12/2022    Past Surgical History:  Procedure Laterality Date   caesarean     CESAREAN SECTION     CHOLECYSTECTOMY     DILATION AND CURETTAGE OF UTERUS     HIP SURGERY Left    LEFT HEART  CATH AND CORONARY ANGIOGRAPHY N/A 02/13/2022   Procedure: LEFT HEART CATH AND CORONARY ANGIOGRAPHY;  Surgeon: Dann Candyce RAMAN, MD;  Location: [facility] INVASIVE CV LAB;  Service: Cardiovascular;  Laterality: N/A;   MASTECTOMY     PORT A CATH INJECTION ([facility] HX)  2021    Review of systems negative except as noted in HPI / PMHx or noted below:  Review of Systems  Constitutional: Negative.   HENT: Negative.    Eyes: Negative.   Respiratory: Negative.    Cardiovascular: Negative.   Gastrointestinal: Negative.   Genitourinary: Negative.   Musculoskeletal: Negative.   Skin: Negative.   Neurological: Negative.   Endo/Heme/Allergies: Negative.   Psychiatric/Behavioral: Negative.       Objective:   Vitals:   06/24/24 1005 06/24/24 1035  BP: (!) 112/94 (!) 112/94  Pulse: 96   Resp: 18   Temp: 99.1 F (37.3 C)   SpO2: 96%    Height: 5' 3.8 (162.1 cm)  Weight: 230 lb 12.8 oz (104.7 kg)   Physical Exam Constitutional:      Appearance: She is not diaphoretic.  HENT:     Head: Normocephalic.     Right Ear: Tympanic membrane, ear canal and external ear normal.     Left Ear:  Tympanic membrane, ear canal and external ear normal.     Nose: Nose normal. No mucosal edema or rhinorrhea.     Mouth/Throat:     Pharynx: Uvula midline. No oropharyngeal exudate.  Eyes:     Conjunctiva/sclera: Conjunctivae normal.  Neck:     Thyroid : No thyromegaly.     Trachea: Trachea normal. No tracheal tenderness or tracheal deviation.  Cardiovascular:     Rate and Rhythm: Normal rate and regular rhythm.     Heart sounds: Normal heart sounds, S1 normal and S2 normal. No murmur heard. Pulmonary:     Effort: No respiratory distress.     Breath sounds: Normal breath sounds. No stridor. No wheezing or rales.  Lymphadenopathy:     Head:     Right side of head: No tonsillar adenopathy.     Left side of head: No tonsillar adenopathy.     Cervical: No cervical adenopathy.  Skin:    Findings: Rash (Excoriated indurated erythematous left anterior chest) present. No erythema.     Nails: There is no clubbing.  Neurological:     Mental Status: She is alert.     Diagnostics: Spirometry was performed and demonstrated an FEV1 of 2.63 at 102 % of predicted.  Assessment and Plan:   1. Asthma, moderate persistent, well-controlled   2. Other allergic rhinitis   3. LPRD (laryngopharyngeal reflux disease)   4. Viral respiratory infection   5. Inflammatory dermatosis      1. Continue immunotherapy and EpiPen   2. Continue famotidine  40 mg -1 tablet 2 times per day  3. Can restart the following during asthma / rhinitis activity:  A. Wixela 250 - 1 inhalations 1-2 times per day   B. Flonase  - 1 spray each nostril 3-7 times per week  4. If needed:   A. Airsupra  - 2 inhalations every 6 hours (replaces albuterol )  B. OTC antihistamime  C. Nasal saline  5. For this recent event:   A. Restart Wixela - 2 times per day  B. Can add OTC antihistamine 1-2 times per day  C. Can add OTC Mucinex DM - 2 times per day  D. Can add OTC Nasal saline a few times per day E. Further evaluation  and  treatment??? F. Viral nasal swabs???  6. For skin inflammation:  A. Mometasone  0.1% ointment 1 time per day B. Further evaluation and treatment???  7. Return to clinic in 6 months or earlier if problem  8. Influenza = Tamiflu. Covid = Paxlovid  Stormey was really doing very well while using immunotherapy as a approach to dealing with her atopic respiratory disease and was able to taper off her Wixela and Flonase .  And her reflux appears to be under very good control while using famotidine .  There are 2 new issues.  She obviously has a viral respiratory tract infection and she can use the plan noted above.  And she has some form of inflammatory dermatosis affecting her left anterior chest which should resolve with a few days or maybe a week of topical mometasone .  Assuming she does well with the plan noted above I will see her back in this clinic in 6 months or earlier if there is a problem.  Camellia Denis, MD Allergy / Immunology [facility]

## 2024-06-24 NOTE — Patient Instructions (Addendum)
°  1. Continue immunotherapy and EpiPen   2. Continue famotidine  40 mg -1 tablet 2 times per day  3. Can restart the following during asthma / rhinitis activity:  A. Wixela 250 - 1 inhalations 1-2 times per day   B. Flonase  - 1 spray each nostril 3-7 times per week  4. If needed:   A. Airsupra  - 2 inhalations every 6 hours (replaces albuterol )  B. OTC antihistamime  C. Nasal saline  5. For this recent event:   A. Restart Wixela - 2 times per day  B. Can add OTC antihistamine 1-2 times per day  C. Can add OTC Mucinex DM - 2 times per day  D. Can add OTC Nasal saline a few times per day E. Further evaluation and treatment??? F. Viral nasal swabs???  6. For skin inflammation:  A. Mometasone  0.1% ointment 1 time per day B. Further evaluation and treatment???  7. Return to clinic in 6 months or earlier if problem  8. Influenza = Tamiflu. Covid = Paxlovid

## 2024-06-28 ENCOUNTER — Encounter: Payer: Self-pay | Admitting: Allergy and Immunology

## 2024-06-29 ENCOUNTER — Encounter: Payer: Self-pay | Admitting: Registered Nurse

## 2024-06-29 ENCOUNTER — Encounter: Attending: Registered Nurse | Admitting: Registered Nurse

## 2024-06-29 VITALS — BP 121/86 | HR 91 | Ht 63.8 in | Wt 222.0 lb

## 2024-06-29 DIAGNOSIS — G894 Chronic pain syndrome: Secondary | ICD-10-CM

## 2024-06-29 DIAGNOSIS — M47816 Spondylosis without myelopathy or radiculopathy, lumbar region: Secondary | ICD-10-CM

## 2024-06-29 DIAGNOSIS — Z79899 Other long term (current) drug therapy: Secondary | ICD-10-CM | POA: Diagnosis not present

## 2024-06-29 DIAGNOSIS — Z79891 Long term (current) use of opiate analgesic: Secondary | ICD-10-CM | POA: Diagnosis not present

## 2024-06-29 MED ORDER — HYDROCODONE-ACETAMINOPHEN 5-325 MG PO TABS: 1.0000 | ORAL_TABLET | Freq: Two times a day (BID) | ORAL | 0 refills | Status: DC | PRN

## 2024-06-29 MED ORDER — NALOXONE HCL 4 MG/0.1ML NA LIQD: NASAL | 0 refills | Status: AC

## 2024-06-29 NOTE — Progress Notes (Signed)
 "  Subjective:    Patient ID: Laurie Simmons, female    DOB: [birth year], 58 y.o.   MRN: [id number]  HPI: Laurie Simmons is a 58 y.o. female who returns for follow up appointment for chronic pain and medication refill. She states her pain is located in her lower back, it has increased in intensity with walking and stretching, She reports she has a scheduled appointment with Neurosurgery on 07/14/2024. She rates her pain 5.Her  current exercise regime is walking.    Laurie Simmons Morphine equivalent is 10.00 MME.   Last Oral Swab was Performed on 03/17/2024, it was consistent.    Pain Inventory Average Pain 5 Pain Right Now 5 My pain is constant and aching  In the last 24 hours, has pain interfered with the following? General activity 8 Relation with others 3 Enjoyment of life 5 What TIME of day is your pain at its worst? daytime Sleep (in general) Fair  Pain is worse with: walking and standing Pain improves with: rest Relief from Meds: none taken  Family History  Problem Relation Age of Onset   Kidney disease Mother    Pulmonary fibrosis Father    Cancer Maternal Grandmother        carcinoma of the vulva and possible ovarian cancer   Prostate cancer Maternal Grandfather    Lung cancer Paternal Grandfather        heavy smoker   Social History   Socioeconomic History   Marital status: Married    Spouse name: Phil   Number of children: 2   Years of education: Not on file   Highest education level: Not on file  Occupational History   Not on file  Tobacco Use   Smoking status: Never   Smokeless tobacco: Never  Vaping Use   Vaping status: Never Used  Substance and Sexual Activity   Alcohol use: No   Drug use: No   Sexual activity: Yes  Other Topics Concern   Not on file  Social History Narrative   Not on file   Social Drivers of Health   Tobacco Use: Low Risk (06/28/2024)   Patient History    Smoking Tobacco Use: Never    Smokeless Tobacco Use: Never    Passive Exposure:  Not on file  Financial Resource Strain: Not on file  Food Insecurity: Low Risk (03/16/2024)   Received from [facility]   Epic    Within the past 12 months, you worried that your food would run out before you got money to buy more: Never true    Within the past 12 months, the food you bought just didn't last and you didn't have money to get more. : Never true  Transportation Needs: No Transportation Needs (03/16/2024)   Received from [facility]    In the past 12 months, has lack of reliable transportation kept you from medical appointments, meetings, work or from getting things needed for daily living? : No  Physical Activity: Not on file  Stress: Not on file  Social Connections: Not on file  Depression (PHQ2-9): Low Risk (03/17/2024)   Depression (PHQ2-9)    PHQ-2 Score: 0  Alcohol Screen: Not on file  Housing: Low Risk (03/16/2024)   Received from [facility]   Epic    What is your living situation today?: I have a steady place to live    Think about the place you live. Do you have problems with any of the following? Choose  all that apply:: None/None on this list  Utilities: Low Risk (03/16/2024)   Received from [facility]   Utilities    In the past 12 months has the electric, gas, oil, or water company threatened to shut off services in your home? : No  Health Literacy: Not on file   Past Surgical History:  Procedure Laterality Date   caesarean     CESAREAN SECTION     CHOLECYSTECTOMY     DILATION AND CURETTAGE OF UTERUS     HIP SURGERY Left    LEFT HEART CATH AND CORONARY ANGIOGRAPHY N/A 02/13/2022   Procedure: LEFT HEART CATH AND CORONARY ANGIOGRAPHY;  Surgeon: Dann Candyce RAMAN, MD;  Location: [facility];  Service: Cardiovascular;  Laterality: N/A;   MASTECTOMY     PORT A CATH INJECTION ([facility] HX)  2021   Past Surgical History:  Procedure Laterality Date   caesarean     CESAREAN SECTION     CHOLECYSTECTOMY     DILATION AND CURETTAGE OF  UTERUS     HIP SURGERY Left    LEFT HEART CATH AND CORONARY ANGIOGRAPHY N/A 02/13/2022   Procedure: LEFT HEART CATH AND CORONARY ANGIOGRAPHY;  Surgeon: Dann Candyce RAMAN, MD;  Location: [facility] INVASIVE CV LAB;  Service: Cardiovascular;  Laterality: N/A;   MASTECTOMY     PORT A CATH INJECTION ([facility] HX)  2021   Past Medical History:  Diagnosis Date   Abnormal stress test: Anterior wall ischemia cardiac cath after that showing 25% RCA no LAD disease 02/12/2022   Acquired absence of left breast 08/18/2020   Allergic rhinitis 03/10/2015   Anxiety    Anxiety and depression 08/30/2021   Arthritis    Asthma    Bilateral carpal tunnel syndrome 08/25/2018   BMI 40.0-44.9, adult (HCC) 08/23/2019   Breast cancer (HCC) 2021   Carpal tunnel syndrome    Carpal tunnel syndrome    bilateral   Chronic kidney disease    stage 3   Depression    Diabetes (HCC)    Dyslipidemia 03/27/2022   Essential hypertension 02/12/2022   Fatigue 08/30/2021   Fibroid 05/04/2019   Fibroids, intramural 08/09/2019   GERD (gastroesophageal reflux disease)    Hemodynamic instability 03/10/2015   Hypertension    Idiopathic urticaria 03/10/2015   Iron deficiency anemia due to chronic blood loss 08/09/2019   Irritable bowel syndrome    Malignant neoplasm of female breast (HCC) 04/17/2022   Moderate persistent asthma 03/10/2015   Obesity    Pain in both hands 07/16/2018   PMB (postmenopausal bleeding) 04/17/2022   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   Severe anemia 12/14/2015   Type 2 diabetes mellitus without complication, without long-term current use of insulin (HCC) 02/12/2022   LMP 01/23/2011   Opioid Risk Score:   Fall Risk Score:  `1  Depression screen [facility] 2/9     03/17/2024   10:02 AM 05/02/2023   12:46 PM 03/21/2023   10:13 AM 02/19/2023   11:26 AM 01/15/2023   10:40 AM  Depression screen PHQ 2/9  Decreased Interest 0 0 0 0 1  Down, Depressed, Hopeless 0 0 0 0 1   PHQ - 2 Score 0 0 0 0 2  Altered sleeping     1  Tired, decreased energy     1  Change in appetite     0  Feeling bad or failure about yourself      0  Trouble concentrating  1  Moving slowly or fidgety/restless     0  Suicidal thoughts     0  PHQ-9 Score     5      Data saved with a previous flowsheet row definition    Review of Systems  Musculoskeletal:  Positive for back pain.  All other systems reviewed and are negative.      Objective:   Physical Exam Vitals and nursing note reviewed.  Constitutional:      Appearance: Normal appearance.  Cardiovascular:     Rate and Rhythm: Normal rate and regular rhythm.     Pulses: Normal pulses.     Heart sounds: Normal heart sounds.  Pulmonary:     Effort: Pulmonary effort is normal.     Breath sounds: Normal breath sounds.  Musculoskeletal:     Comments: Normal Muscle Bulk and Muscle Testing Reveals:  Upper Extremities:Full  ROM and Muscle Strength 5/5 Lumbar Paraspinal Tenderness: L-4-L-5 Lower Extremities: Full ROM and Muscle Strength 5/5     Skin:    General: Skin is warm and dry.  Neurological:     Mental Status: She is alert and oriented to person, place, and time.  Psychiatric:        Mood and Affect: Mood normal.        Behavior: Behavior normal.          Assessment & Plan:  Spondylosis without Myelopathy/ Lumbar Facet Arthropathy: Continue HEP as Tolerated. Continue to Monitor. 06/29/2024 Arthritis of Left Hip: Continue HEP as Tolerated. Continue current medication regimen. Continue to Monitor. 06/29/2024 Acquired leg length discrepancy: Continue to Monitor. . 06/29/2024 Chronic Pain Syndrome: Refilled: Hydrocodone  5 mg/325 one tablet twice a day as needed for pain #60. We will continue the opioid monitoring program, this consists of regular clinic visits, examinations, urine drug screen, pill counts as well as use of [place]  Controlled Substance Reporting system. A 12 month History has been reviewed  on the [place]  Controlled Substance Reporting System Today.  Tramadol  ineffective:    Continue F/U 2 months        "

## 2024-07-01 NOTE — Progress Notes (Signed)
 "   Referring Physician:  Emeline Joesph BROCKS, DO [address] [place],  [place] [address]  Primary Physician:  Trinidad Hun, MD  Discussed the use of AI scribe software for clinical note transcription with the patient, who gave verbal consent to proceed.  History of Present Illness Laurie Simmons is a 58 year old female with lumbar spondylolisthesis and facet arthropathy who presents for evaluation of chronic low back pain. Chronic low back pain began about four years ago after mastectomy. Pain is localized to the lower lumbar region at the waistband level, bilateral and midline, without radiation. She denies radicular symptoms, numbness, weakness, changes in leg sensation, foot drop, or loss of lower extremity function. Pain worsens with walking, standing, and upright activities, and she avoids lifting. A shoe lift for leg length discrepancy has provided minimal improvement, and cost limits her to one pair of shoes with the lift. She completed two courses of physical therapy and had medial branch block and hip steroid injections without relief. Hydrocodone  now provides limited benefit. Tramadol  was ineffective, and medication options are restricted by kidney disease.  Bowel/Bladder Dysfunction: none  Conservative measures:  Physical therapy:  has participated in PT in 2024 [facility] [place] Multimodal medical therapy including regular antiinflammatories:  Tramadol , hydrocodone  Injections: no epidural steroid injections  Past Surgery: none  The symptoms are causing a significant impact on the patient's life.   I have utilized the care everywhere function in epic to review the outside records available from external health systems.  Review of Systems:  A 10 point review of systems is negative, except for the pertinent positives and negatives detailed in the HPI.  Past Medical History: Past Medical History:  Diagnosis Date   Abnormal stress test:  Anterior wall ischemia cardiac cath after that showing 25% RCA no LAD disease 02/12/2022   Acquired absence of left breast 08/18/2020   Allergic rhinitis 03/10/2015   Anxiety    Anxiety and depression 08/30/2021   Arthritis    Asthma    Bilateral carpal tunnel syndrome 08/25/2018   BMI 40.0-44.9, adult (HCC) 08/23/2019   Breast cancer (HCC) 2021   Carpal tunnel syndrome    Carpal tunnel syndrome    bilateral   Chronic kidney disease    stage 3   Depression    Diabetes (HCC)    Dyslipidemia 03/27/2022   Essential hypertension 02/12/2022   Fatigue 08/30/2021   Fibroid 05/04/2019   Fibroids, intramural 08/09/2019   GERD (gastroesophageal reflux disease)    Hemodynamic instability 03/10/2015   Hypertension    Idiopathic urticaria 03/10/2015   Iron deficiency anemia due to chronic blood loss 08/09/2019   Irritable bowel syndrome    Malignant neoplasm of female breast (HCC) 04/17/2022   Moderate persistent asthma 03/10/2015   Obesity    Pain in both hands 07/16/2018   PMB (postmenopausal bleeding) 04/17/2022   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   Severe anemia 12/14/2015   Type 2 diabetes mellitus without complication, without long-term current use of insulin (HCC) 02/12/2022    Past Surgical History: Past Surgical History:  Procedure Laterality Date   caesarean     CESAREAN SECTION     CHOLECYSTECTOMY     DILATION AND CURETTAGE OF UTERUS     HIP SURGERY Left    LEFT HEART CATH AND CORONARY ANGIOGRAPHY N/A 02/13/2022   Procedure: LEFT HEART CATH AND CORONARY ANGIOGRAPHY;  Surgeon: Dann Candyce RAMAN, MD;  Location: [facility];  Service: Cardiovascular;  Laterality: N/A;   MASTECTOMY     PORT A CATH INJECTION ([facility] HX)  2021    Allergies: Allergies as of 07/14/2024 - Review Complete 07/14/2024  Allergen Reaction Noted   Effexor  [venlafaxine ] Palpitations 10/10/2020   Nsaids Other (See Comments) 03/07/2020     Medications: Current Medications[1]  Social History: Social History[2]  Family Medical History: Family History  Problem Relation Age of Onset   Kidney disease Mother    Pulmonary fibrosis Father    Cancer Maternal Grandmother        carcinoma of the vulva and possible ovarian cancer   Prostate cancer Maternal Grandfather    Lung cancer Paternal Grandfather        heavy smoker    Physical Examination: Vitals:   07/14/24 0849  BP: (!) 142/102    General: Patient is in no apparent distress. Attention to examination is appropriate.  Neck:   Supple.  Full range of motion.  Respiratory: Patient is breathing without any difficulty.   NEUROLOGICAL:     Awake, alert, oriented to person, place, and time.  Speech is clear and fluent.   Cranial Nerves: Pupils equal round and reactive to light.  Facial tone is symmetric.  Facial sensation is symmetric. Shoulder shrug is symmetric. Tongue protrusion is midline.    Strength:  Side Iliopsoas Quads Hamstring PF DF EHL  R 5 5 5 5 5 5   L 5 5 5 5 5 5    Reflexes are hyporeflexic in the bilateral lower extremities.   Bilateral upper and lower extremity sensation is intact to light touch .     No evidence of dysmetria noted.  Gait is normal.    Imaging: Narrative & Impression  EXAM: MRI LUMBAR SPINE 06/04/2024 01:20:23 PM   TECHNIQUE: Multiplanar multisequence MRI of the lumbar spine was performed without the administration of intravenous contrast.   COMPARISON: Lumbar spine radiographs 01/21/2023.   CLINICAL HISTORY: Low back pain, symptoms persist with > 6 weeks treatment. Lumbar facet arthropathy.   FINDINGS:   BONES AND ALIGNMENT: 5 lumbar type vertebrae. Hypoplastic ribs at T12. Chronic grade 1 anterolisthesis of L4 and L5 measuring 6 mm. Trace retrolisthesis of L1 on L2. Mild bilateral facet edema at L4-L5. No fracture or suspicious marrow lesion.   SPINAL CORD: The conus medullaris terminates at L1-L2  and is normal in signal.   SOFT TISSUES: No paraspinal mass.   DISC LEVELS: Multilevel disc desiccation, greatest at L4-L5 where there is moderate disc space narrowing. Mild narrowing at L1-L2.   T12-L1: Negative.   L1-L2: Mild disc bulging, small right foraminal to extraforaminal disc extrusion, and mild facet hypertrophy result in moderate stenosis of the right neural foramen laterally with potential right L1 nerve impingement. No spinal stenosis.   L2-L3: Mild disc bulging and mild facet hypertrophy without stenosis.   L3-L4: Disc bulging and moderate to severe facet hypertrophy without stenosis.   L4-L5: Anterolisthesis with left eccentric bulging of uncovered disc and severe facet hypertrophy result in mild spinal stenosis and moderate left neural foraminal stenosis.   L5-S1: Mild disc bulging and mild facet hypertrophy without stenosis.   IMPRESSION: 1. Severe facet arthrosis at L4-L5 with mild facet edema, grade 1 anterolisthesis, mild spinal stenosis, and moderate left neural foraminal stenosis. 2. Right foraminal disc extrusion at L1-L2 potentially affecting the right L1 nerve.   Electronically signed by: Dasie Hamburg MD 06/08/2024 02:55 PM EST RP Workstation: [id number]   I  have personally reviewed the images and agree with the above interpretation.  Assessment and Plan Assessment & Plan Lumbar spondylolisthesis with facet arthropathy and chronic low back pain She has chronic, mechanical low back pain for four years, without radicular symptoms or neurological deficits, associated with grade I-II lumbar spondylolisthesis and significant facet arthropathy. She has completed 2-3 years of conservative management, including physical therapy, medial branch blocks, and multiple medications, has failed. Pain is exacerbated by upright activity and standing. Bone density is borderline normal/osteopenic but adequate for surgical intervention if indicated. Anatomy is  favorable for standard surgical approaches. Further management depends on spondylolisthesis stability, to be assessed with flexion-extension x-rays. Surgical intervention, specifically fusion, would be considered only if instability and severe symptoms are present, after exhausting conservative measures. Risks of spinal fusion were discussed, and rationale for delaying surgery until clear instability or loss of function was explained. She reports minimal benefit from shoe lift thus far. - Ordered lumbar flexion-extension x-rays to assess for instability. - Will review imaging results and communicate findings to discuss next steps. - Discussed that surgical fixation (fusion) may be considered if instability and severe symptoms are present, but only after exhausting conservative measures. - Advised continued use of shoe lift for potential incremental improvement. - Confirmed bone strength is sufficient for surgery if indicated.   Penne MICAEL Sharps MD/MSCR Neurosurgery      [1]  Current Outpatient Medications:    AIRSUPRA  90-80 MCG/ACT AERO, Inhale 2 puffs into the lungs as needed (every 4 to 6 hours for cough, wheeze, shortness of breath.  Rinse, mouth after use)., Disp: 10.7 g, Rfl: 1   anastrozole  (ARIMIDEX ) 1 MG tablet, TAKE 1 TABLET(1 MG) BY MOUTH DAILY, Disp: 90 tablet, Rfl: 3   aspirin  EC 81 MG tablet, Take 1 tablet (81 mg total) by mouth daily. Swallow whole., Disp: 90 tablet, Rfl: 3   Biotin 10000 MCG TBDP, Take 10,000 mcg by mouth daily., Disp: , Rfl:    busPIRone  (BUSPAR ) 5 MG tablet, TAKE 1 TABLET(5 MG) BY MOUTH TWICE DAILY, Disp: 180 tablet, Rfl: 0   calcium  carbonate (TUMS EX) 750 MG chewable tablet, Chew 1,500 mg by mouth daily as needed for heartburn., Disp: , Rfl:    cariprazine  (VRAYLAR ) 1.5 MG capsule, TAKE 1 CAPSULE(1.5 MG) BY MOUTH DAILY, Disp: 90 capsule, Rfl: 0   Cholecalciferol (VITAMIN D-3 PO), Take by mouth daily., Disp: , Rfl:    EPINEPHrine  (EPIPEN  2-PAK) 0.3 mg/0.3 mL  IJ SOAJ injection, Use as directed for life-threatening allergic reaction., Disp: 1 each, Rfl: 3   famotidine  (PEPCID ) 40 MG tablet, TAKE 1 TABLET BY MOUTH TWICE DAILY AS DIRECTED, Disp: 60 tablet, Rfl: 3   FARXIGA 10 MG TABS tablet, Take 10 mg by mouth daily., Disp: , Rfl:    FIBER PO, Take by mouth daily., Disp: , Rfl:    fluticasone  (FLONASE ) 50 MCG/ACT nasal spray, Place 2 sprays into both nostrils daily as needed for allergies or rhinitis., Disp: , Rfl:    fluticasone -salmeterol (WIXELA INHUB) 250-50 MCG/ACT AEPB, Inhale one dose twice daily as directed.  Rinse, gargle, and spit after use., Disp: 60 each, Rfl: 5   folic acid  (FOLVITE ) 1 MG tablet, TAKE 1 TABLET BY MOUTH DAILY, Disp: 30 tablet, Rfl: 5   HYDROcodone -acetaminophen  (NORCO/VICODIN) 5-325 MG tablet, Take 1 tablet by mouth 2 (two) times daily as needed for moderate pain (pain score 4-6)., Disp: 60 tablet, Rfl: 0   loratadine  (CLARITIN ) 10 MG tablet, TAKE 1 TABLET BY MOUTH ONCE  DAILY AS NEEDED, Disp: 90 tablet, Rfl: 1   mometasone  (ELOCON ) 0.1 % ointment, Apply to skin once daily as directed., Disp: 90 g, Rfl: 3   MOUNJARO 7.5 MG/0.5ML Pen, Inject 7.5 mg into the skin once a week., Disp: , Rfl:    naloxone  (NARCAN ) nasal spray 4 mg/0.1 mL, Use as directed for Lethargy or respiratory depression after opioid use., Disp: 2 each, Rfl: 0   ondansetron  (ZOFRAN ) 4 MG tablet, TAKE 1 TABLET BY MOUTH EVERY 4 HOURS AS NEEDED FOR NAUSEA, Disp: 30 tablet, Rfl: 0   potassium chloride  (KLOR-CON  M) 10 MEQ tablet, Take 2 tablets (20 mEq total) by mouth 2 (two) times daily., Disp: 240 tablet, Rfl: 1   prochlorperazine (COMPAZINE) 10 MG tablet, TAKE 1 TABLET BY MOUTH EVERY 6 HOURS AS NEEDED FOR NAUSEA, Disp: 90 tablet, Rfl: 1   rosuvastatin  (CRESTOR ) 20 MG tablet, TAKE 1 TABLET(20 MG) BY MOUTH DAILY, Disp: 90 tablet, Rfl: 3   senna-docusate (SENOKOT-S) 8.6-50 MG tablet, Take 2 tablets by mouth daily., Disp: , Rfl:    topiramate  (TOPAMAX ) 50 MG tablet,  Take 1 tablet (50 mg total) by mouth daily., Disp: 90 tablet, Rfl: 3   UNABLE TO FIND, Inject 1 each as directed See admin instructions. Allergy injections for cats, grass, and dust., Disp: , Rfl:    valsartan-hydrochlorothiazide (DIOVAN-HCT) 320-25 MG tablet, Take 1 tablet by mouth daily., Disp: , Rfl: 0   Vilazodone  HCl (VIIBRYD ) 40 MG TABS, Take 1 tablet (40 mg total) by mouth daily. TAKE 1 TABLET(40 MG) BY MOUTH DAILY, Disp: 90 tablet, Rfl: 3 [2]  Social History Tobacco Use   Smoking status: Never   Smokeless tobacco: Never  Vaping Use   Vaping status: Never Used  Substance Use Topics   Alcohol use: No   Drug use: No   "

## 2024-07-05 ENCOUNTER — Ambulatory Visit: Admitting: *Deleted

## 2024-07-05 DIAGNOSIS — J309 Allergic rhinitis, unspecified: Secondary | ICD-10-CM | POA: Diagnosis not present

## 2024-07-11 ENCOUNTER — Encounter: Payer: Self-pay | Admitting: Registered Nurse

## 2024-07-14 ENCOUNTER — Ambulatory Visit: Admitting: Neurosurgery

## 2024-07-14 ENCOUNTER — Ambulatory Visit

## 2024-07-14 ENCOUNTER — Encounter: Payer: Self-pay | Admitting: Neurosurgery

## 2024-07-14 VITALS — BP 142/102 | Ht 63.8 in | Wt 225.0 lb

## 2024-07-14 DIAGNOSIS — M4316 Spondylolisthesis, lumbar region: Secondary | ICD-10-CM

## 2024-07-14 DIAGNOSIS — M545 Low back pain, unspecified: Secondary | ICD-10-CM

## 2024-07-14 DIAGNOSIS — M47816 Spondylosis without myelopathy or radiculopathy, lumbar region: Secondary | ICD-10-CM

## 2024-07-14 DIAGNOSIS — G8929 Other chronic pain: Secondary | ICD-10-CM

## 2024-07-21 ENCOUNTER — Ambulatory Visit (INDEPENDENT_AMBULATORY_CARE_PROVIDER_SITE_OTHER): Admitting: Psychiatry

## 2024-07-21 DIAGNOSIS — F411 Generalized anxiety disorder: Secondary | ICD-10-CM | POA: Diagnosis not present

## 2024-07-21 NOTE — Progress Notes (Signed)
 "       [facility] Counselor/Therapist Progress Note  Patient ID: Laurie Simmons, MRN: [id number],    Date: 07/21/2024  Time Spent: 55 minutes   Treatment Type: Individual Therapy  Reported Symptoms: anxiety, depression, challenges with elderly mother's health challenges, overwhelmed at times, denies any SI    Mental Status Exam:  Appearance:   Casual     Behavior:  Appropriate, Sharing, and Motivated  Motor:  Affected some by back pain  Speech/Language:   Clear and Coherent  Affect:  Anxious, depressed  Mood:  anxious and depressed  Thought process:  goal directed  Thought content:    Rumination  Sensory/Perceptual disturbances:    WNL  Orientation:  oriented to person, place, time/date, situation, day of week, month of year, year, and stated date of Jan. 7, 2026  Attention:  Good  Concentration:  Good and Fair  Memory:  WNL  Fund of knowledge:   Good  Insight:    Good and Fair  Judgment:   Good  Impulse Control:  Good   Risk Assessment: Danger to Self:  No Self-injurious Behavior: No Danger to Others: No Duty to Warn:no Physical Aggression / Violence:No  Access to Firearms a concern: No  Gang Involvement:No   Subjective:   Patient working further on her anxiety, frustrations within multiple family situations, depression, and concern for her 57 year old mother who is having more declining memory issues.  Patient does tend to ruminate more about the past as well as her present situation which is understandably difficult for her to manage. Tired physically and emotionally. Feels like there is always something in crisis or something is new that we have to deal with, and this just feels ongoing. Mom not able to be as independent. Still having some back pain issues and has had some testing done. Real concerned about mom's declining health and talking through her concerns in session today, and her difficulty in accepting mom's decline as we have always been very close. Emphasized  self-care strategies with patient to help with her feeling overwhelmed.  Patient is hoping to hear back from her neurosurgeon soon about recent testing that they did regarding her back pain that has continued.   Interventions: Cognitive Behavioral Therapy, Solution-Oriented/Positive Psychology, and Ego-Supportive Long-term goal: Develop healthy cognitive patterns and beliefs about self and the world that lead to alleviation and help prevent the relapse of depressive symptoms Short-term goal: Replace negative self-defeating self-talk with verbalization of realistic and positive cognitive messages. Strategies: Reinforce positive, reality-based cognitive messages that enhance self-confidence and increase adaptive action.    Diagnosis:   ICD-10-CM   1. Generalized anxiety disorder  F41.1      Plan:   Patient today focusing more on fears of future, depression, anxiety, and mother's mental decline and how it impacts patient's outlook. Sharing and processing her concerns with her mom and for herself, and talking through more self-care skills that can help patient in taking care of her own health and well-being in addition to her mother's health.  Also seeing more of her fears today about mom's declining and seemed to feel supported and empowered by her talking about these issues more directly. Therapist continues to encourage patient to: Be more open with friends and fellow church members that can help be supportive of her as she tries to help her elderly mom who is having a lot of age-related and memory issues, remain in contact with others outside of her church who can be supportive, refrain from  assuming that things are always going to go in a negative direction, get outside some each day as weather permits, healthy nutrition and exercise as she is able, positive and encouraging self-talk, challenge and counteract her self-doubt, reduce her overthinking, and recognize the strengths she shows when  working with goal-directed behaviors trying to move in a direction that supports her overall improved health and wellbeing.  Erin Gassmann has made some progress gradually and she definitely needs to continue working with goal-directed behaviors and gain support in order to keep moving and a more healthy and hopeful direction going forward.  Goal review and progress/challenges noted with patient.  Next appointment within 3 weeks.   Barnie Bunde, LCSW                   "

## 2024-07-22 ENCOUNTER — Encounter: Payer: Self-pay | Admitting: Neurosurgery

## 2024-07-23 DIAGNOSIS — J3089 Other allergic rhinitis: Secondary | ICD-10-CM | POA: Diagnosis not present

## 2024-07-23 DIAGNOSIS — J301 Allergic rhinitis due to pollen: Secondary | ICD-10-CM | POA: Diagnosis not present

## 2024-07-23 DIAGNOSIS — J3081 Allergic rhinitis due to animal (cat) (dog) hair and dander: Secondary | ICD-10-CM | POA: Diagnosis not present

## 2024-07-23 NOTE — Progress Notes (Signed)
 VIALS MADE ON 07/23/24

## 2024-07-27 ENCOUNTER — Ambulatory Visit: Payer: Self-pay | Admitting: Neurosurgery

## 2024-07-29 ENCOUNTER — Telehealth: Payer: Self-pay | Admitting: Registered Nurse

## 2024-07-29 DIAGNOSIS — G894 Chronic pain syndrome: Secondary | ICD-10-CM

## 2024-07-29 DIAGNOSIS — M47816 Spondylosis without myelopathy or radiculopathy, lumbar region: Secondary | ICD-10-CM

## 2024-07-29 MED ORDER — HYDROCODONE-ACETAMINOPHEN 5-325 MG PO TABS: 1.0000 | ORAL_TABLET | Freq: Two times a day (BID) | ORAL | 0 refills | Status: AC | PRN

## 2024-07-29 NOTE — Telephone Encounter (Signed)
 PDMP was Reviewed.  Hydrocodone  e-scribed to pharmacy.  Laurie Simmons is aware via My-Chart message.

## 2024-07-30 ENCOUNTER — Other Ambulatory Visit: Payer: Self-pay | Admitting: Allergy and Immunology

## 2024-08-02 ENCOUNTER — Ambulatory Visit: Admitting: *Deleted

## 2024-08-02 DIAGNOSIS — J302 Other seasonal allergic rhinitis: Secondary | ICD-10-CM | POA: Diagnosis not present

## 2024-08-24 ENCOUNTER — Telehealth: Admitting: Adult Health

## 2024-08-26 ENCOUNTER — Encounter: Admitting: Registered Nurse

## 2024-08-31 ENCOUNTER — Ambulatory Visit: Admitting: Psychiatry

## 2024-09-29 ENCOUNTER — Ambulatory Visit: Admitting: Psychiatry

## 2024-10-08 ENCOUNTER — Ambulatory Visit: Admitting: Oncology

## 2024-10-08 ENCOUNTER — Other Ambulatory Visit

## 2024-12-23 ENCOUNTER — Ambulatory Visit: Admitting: Allergy and Immunology
